# Patient Record
Sex: Female | Born: 1990 | Race: Black or African American | Hispanic: No | Marital: Married | Smoking: Never smoker
Health system: Southern US, Community
[De-identification: ages and names within clinical notes are randomized; demographics above are authoritative.]

## PROBLEM LIST (undated history)

## (undated) ENCOUNTER — Inpatient Hospital Stay (HOSPITAL_COMMUNITY): Payer: Self-pay

## (undated) DIAGNOSIS — N289 Disorder of kidney and ureter, unspecified: Secondary | ICD-10-CM

## (undated) DIAGNOSIS — T7840XA Allergy, unspecified, initial encounter: Secondary | ICD-10-CM

## (undated) DIAGNOSIS — G43909 Migraine, unspecified, not intractable, without status migrainosus: Secondary | ICD-10-CM

## (undated) DIAGNOSIS — K5909 Other constipation: Secondary | ICD-10-CM

## (undated) DIAGNOSIS — R112 Nausea with vomiting, unspecified: Secondary | ICD-10-CM

## (undated) DIAGNOSIS — K8020 Calculus of gallbladder without cholecystitis without obstruction: Secondary | ICD-10-CM

## (undated) DIAGNOSIS — Z9889 Other specified postprocedural states: Secondary | ICD-10-CM

## (undated) DIAGNOSIS — R51 Headache: Secondary | ICD-10-CM

## (undated) DIAGNOSIS — R519 Headache, unspecified: Secondary | ICD-10-CM

## (undated) DIAGNOSIS — F319 Bipolar disorder, unspecified: Secondary | ICD-10-CM

## (undated) HISTORY — PX: TONSILLECTOMY: SUR1361

## (undated) HISTORY — PX: OTHER SURGICAL HISTORY: SHX169

## (undated) HISTORY — DX: Bipolar disorder, unspecified: F31.9

## (undated) HISTORY — PX: CHOLECYSTECTOMY: SHX55

## (undated) HISTORY — DX: Other constipation: K59.09

## (undated) HISTORY — PX: MANDIBLE SURGERY: SHX707

---
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
## 2010-01-23 ENCOUNTER — Telehealth (INDEPENDENT_AMBULATORY_CARE_PROVIDER_SITE_OTHER): Payer: Self-pay | Admitting: *Deleted

## 2010-04-20 ENCOUNTER — Encounter: Payer: Self-pay | Admitting: Pediatrics

## 2010-05-01 NOTE — Progress Notes (Signed)
  Phone Note Call from Patient   Summary of Call: Sharon Townsend CALLED  WAS TOLD BY [facility] THAT HER RECORDS WERE SENT HERE,BUT SHE WANTS TO GO TO [place] /WE DON'T HAVE THEM YET  Initial call taken by: Arta Bruce,  January 23, 2010 3:16 PM  Follow-up for Phone Call        records never came in Follow-up by: Arta Bruce,  February 17, 2010 12:00 PM

## 2010-06-13 LAB — BASIC METABOLIC PANEL
BUN: 9 mg/dL (ref 6–23)
Calcium: 9.5 mg/dL (ref 8.4–10.5)
Creatinine, Ser: 0.79 mg/dL (ref 0.4–1.2)
GFR calc non Af Amer: >60 mL/min (ref >60)
Glucose, Bld: 92 mg/dL (ref 70–99)
Sodium: 142 mEq/L (ref 135–145)

## 2010-06-13 LAB — CBC
HCT: 38.7 % (ref 36.0–46.0)
Hemoglobin: 13.4 g/dL (ref 12.0–15.0)
MCH: 31.3 pg (ref 26.0–34.0)
MCHC: 34.6 g/dL (ref 30.0–36.0)
MCV: 90.5 fL (ref 78.0–100.0)
Platelets: 272 10*3/uL (ref 150–400)
RBC: 4.28 MIL/uL (ref 3.87–5.11)
RDW: 12.1 % (ref 11.5–15.5)
WBC: 9.7 10*3/uL (ref 4.0–10.5)

## 2010-06-13 LAB — COMPREHENSIVE METABOLIC PANEL
ALT: 13 U/L (ref 0–35)
AST: 20 U/L (ref 0–37)
Albumin: 4.4 g/dL (ref 3.5–5.2)
Alkaline Phosphatase: 72 U/L (ref 39–117)
BUN: 9 mg/dL (ref 6–23)
CO2: 22 mEq/L (ref 19–32)
Calcium: 9.4 mg/dL (ref 8.4–10.5)
Chloride: 110 mEq/L (ref 96–112)
Creatinine, Ser: 0.79 mg/dL (ref 0.4–1.2)
GFR calc Af Amer: >60 mL/min (ref >60)
GFR calc non Af Amer: >60 mL/min (ref >60)
Glucose, Bld: 102 mg/dL — ABNORMAL HIGH (ref 70–99)
Potassium: 3.6 mEq/L (ref 3.5–5.1)
Sodium: 141 mEq/L (ref 135–145)
Total Bilirubin: 0.4 mg/dL (ref 0.3–1.2)
Total Protein: 7.6 g/dL (ref 6.0–8.3)

## 2010-06-13 LAB — URINALYSIS, ROUTINE W REFLEX MICROSCOPIC
Ketones, ur: NEGATIVE mg/dL
Nitrite: NEGATIVE
Protein, ur: NEGATIVE mg/dL
Urobilinogen, UA: 0.2 mg/dL (ref 0.0–1.0)
pH: 6 (ref 5.0–8.0)

## 2010-06-13 LAB — DIFFERENTIAL
Basophils Absolute: 0.1 10*3/uL (ref 0.0–0.1)
Eosinophils Absolute: 0.1 10*3/uL (ref 0.0–0.7)
Lymphs Abs: 2.2 10*3/uL (ref 0.7–4.0)
Neutrophils Relative %: 72 % (ref 43–77)

## 2010-06-13 LAB — ACETAMINOPHEN LEVEL: Acetaminophen (Tylenol), Serum: 51.4 ug/mL — ABNORMAL HIGH (ref 10–30)

## 2010-06-13 LAB — SALICYLATE LEVEL: Salicylate Lvl: <4 mg/dL (ref 2.8–20.0)

## 2010-06-13 LAB — ETHANOL: Alcohol, Ethyl (B): <5 mg/dL (ref 0–10)

## 2010-06-15 LAB — URINALYSIS, ROUTINE W REFLEX MICROSCOPIC
Bilirubin Urine: NEGATIVE
Hgb urine dipstick: NEGATIVE
Ketones, ur: NEGATIVE mg/dL
Nitrite: NEGATIVE
Protein, ur: NEGATIVE mg/dL
Urobilinogen, UA: 0.2 mg/dL (ref 0.0–1.0)

## 2010-06-22 LAB — POCT I-STAT, CHEM 8
BUN: 12 mg/dL (ref 6–23)
Calcium, Ion: 1.22 mmol/L (ref 1.12–1.32)
Chloride: 106 mEq/L (ref 96–112)
HCT: 40 % (ref 36.0–46.0)
Sodium: 140 mEq/L (ref 135–145)
TCO2: 27 mmol/L (ref 0–100)

## 2010-06-22 LAB — POCT URINALYSIS DIP (DEVICE)
Hgb urine dipstick: NEGATIVE
Nitrite: POSITIVE — AB
Specific Gravity, Urine: 1.02 (ref 1.005–1.030)
Urobilinogen, UA: 1 mg/dL (ref 0.0–1.0)
pH: 6.5 (ref 5.0–8.0)

## 2010-06-22 LAB — URINE CULTURE

## 2010-07-03 LAB — COMPREHENSIVE METABOLIC PANEL
ALT: 14 U/L (ref 0–35)
AST: 23 U/L (ref 0–37)
Albumin: 4.5 g/dL (ref 3.5–5.2)
Alkaline Phosphatase: 67 U/L (ref 39–117)
BUN: 11 mg/dL (ref 6–23)
CO2: 21 mEq/L (ref 19–32)
Calcium: 9.6 mg/dL (ref 8.4–10.5)
Chloride: 107 mEq/L (ref 96–112)
Creatinine, Ser: 0.83 mg/dL (ref 0.4–1.2)
GFR calc Af Amer: >60 mL/min (ref >60)
GFR calc non Af Amer: >60 mL/min (ref >60)
Glucose, Bld: 99 mg/dL (ref 70–99)
Potassium: 3.5 mEq/L (ref 3.5–5.1)
Sodium: 137 mEq/L (ref 135–145)
Total Bilirubin: 0.7 mg/dL (ref 0.3–1.2)
Total Protein: 7.4 g/dL (ref 6.0–8.3)

## 2010-07-03 LAB — DIFFERENTIAL
Basophils Absolute: 0.1 10*3/uL (ref 0.0–0.1)
Basophils Relative: 1 % (ref 0–1)
Eosinophils Absolute: 0.1 10*3/uL (ref 0.0–0.7)
Eosinophils Relative: 1 % (ref 0–5)
Lymphocytes Relative: 27 % (ref 12–46)
Lymphs Abs: 2.7 10*3/uL (ref 0.7–4.0)
Monocytes Absolute: 0.6 10*3/uL (ref 0.1–1.0)
Monocytes Relative: 6 % (ref 3–12)
Neutro Abs: 6.4 10*3/uL (ref 1.7–7.7)
Neutrophils Relative %: 65 % (ref 43–77)

## 2010-07-03 LAB — URINALYSIS, ROUTINE W REFLEX MICROSCOPIC
Bilirubin Urine: NEGATIVE
Glucose, UA: NEGATIVE mg/dL
Hgb urine dipstick: NEGATIVE
Ketones, ur: NEGATIVE mg/dL
Nitrite: NEGATIVE
Protein, ur: NEGATIVE mg/dL
Specific Gravity, Urine: 1.006 (ref 1.005–1.030)
Urobilinogen, UA: 0.2 mg/dL (ref 0.0–1.0)
pH: 6.5 (ref 5.0–8.0)

## 2010-07-03 LAB — URINE CULTURE

## 2010-07-03 LAB — LIPASE, BLOOD: Lipase: 28 U/L (ref 11–59)

## 2010-07-03 LAB — CBC
HCT: 38.9 % (ref 36.0–46.0)
Hemoglobin: 13.3 g/dL (ref 12.0–15.0)
MCHC: 34.2 g/dL (ref 30.0–36.0)
MCV: 90.9 fL (ref 78.0–100.0)
Platelets: 242 10*3/uL (ref 150–400)
RBC: 4.29 MIL/uL (ref 3.87–5.11)
RDW: 11.9 % (ref 11.5–15.5)
WBC: 9.9 10*3/uL (ref 4.0–10.5)

## 2010-07-03 LAB — PREGNANCY, URINE: Preg Test, Ur: NEGATIVE

## 2010-07-03 LAB — HEMOCCULT GUIAC POC 1CARD (OFFICE): Fecal Occult Bld: NEGATIVE

## 2010-07-04 LAB — COMPREHENSIVE METABOLIC PANEL
ALT: 12 U/L (ref 0–35)
AST: 24 U/L (ref 0–37)
Albumin: 4.3 g/dL (ref 3.5–5.2)
Calcium: 9.3 mg/dL (ref 8.4–10.5)
Chloride: 109 mEq/L (ref 96–112)
Creatinine, Ser: 0.81 mg/dL (ref 0.4–1.2)
GFR calc Af Amer: >60 mL/min (ref >60)
Sodium: 138 mEq/L (ref 135–145)
Total Bilirubin: 0.5 mg/dL (ref 0.3–1.2)

## 2010-07-04 LAB — RAPID URINE DRUG SCREEN, HOSP PERFORMED
Amphetamines: NOT DETECTED
Barbiturates: NOT DETECTED
Opiates: NOT DETECTED

## 2010-07-04 LAB — URINE MICROSCOPIC-ADD ON

## 2010-07-04 LAB — DIFFERENTIAL
Basophils Absolute: 0 10*3/uL (ref 0.0–0.1)
Eosinophils Absolute: 0.1 10*3/uL (ref 0.0–0.7)
Eosinophils Relative: 1 % (ref 0–5)
Eosinophils Relative: 1 % (ref 0–5)
Lymphocytes Relative: 30 % (ref 12–46)
Lymphocytes Relative: 35 % (ref 12–46)
Lymphs Abs: 2.7 10*3/uL (ref 0.7–4.0)
Monocytes Absolute: 0.5 10*3/uL (ref 0.1–1.0)
Neutro Abs: 4.7 10*3/uL (ref 1.7–7.7)

## 2010-07-04 LAB — URINALYSIS, ROUTINE W REFLEX MICROSCOPIC
Glucose, UA: NEGATIVE mg/dL
Glucose, UA: NEGATIVE mg/dL
Specific Gravity, Urine: 1.018 (ref 1.005–1.030)
pH: 6 (ref 5.0–8.0)
pH: 7 (ref 5.0–8.0)

## 2010-07-04 LAB — CBC
MCV: 91.2 fL (ref 78.0–100.0)
Platelets: 226 10*3/uL (ref 150–400)
Platelets: 231 10*3/uL (ref 150–400)
RBC: 4.39 MIL/uL (ref 3.87–5.11)
RDW: 12.5 % (ref 11.5–15.5)
WBC: 7.5 10*3/uL (ref 4.0–10.5)

## 2010-07-04 LAB — BASIC METABOLIC PANEL
BUN: 10 mg/dL (ref 6–23)
Calcium: 9.5 mg/dL (ref 8.4–10.5)
GFR calc non Af Amer: >60 mL/min (ref >60)
Glucose, Bld: 76 mg/dL (ref 70–99)

## 2010-07-04 LAB — HEMOCCULT GUIAC POC 1CARD (OFFICE): Fecal Occult Bld: POSITIVE

## 2010-07-05 LAB — URINE CULTURE
Colony Count: NO GROWTH
Culture: NO GROWTH

## 2010-07-05 LAB — POCT I-STAT, CHEM 8
BUN: 7 mg/dL (ref 6–23)
Chloride: 107 mEq/L (ref 96–112)
Creatinine, Ser: 0.7 mg/dL (ref 0.4–1.2)
Hemoglobin: 13.9 g/dL (ref 12.0–15.0)
Potassium: 3.8 mEq/L (ref 3.5–5.1)
Sodium: 141 mEq/L (ref 135–145)

## 2010-07-05 LAB — CBC
HCT: 39.8 % (ref 36.0–46.0)
Platelets: 219 10*3/uL (ref 150–400)
WBC: 7.1 10*3/uL (ref 4.0–10.5)

## 2010-07-05 LAB — PROTIME-INR: INR: 1.1 (ref 0.00–1.49)

## 2010-07-05 LAB — TYPE AND SCREEN
ABO/RH(D): O NEG
Antibody Screen: NEGATIVE

## 2010-07-05 LAB — URINALYSIS, ROUTINE W REFLEX MICROSCOPIC
Ketones, ur: NEGATIVE mg/dL
Nitrite: NEGATIVE
Urobilinogen, UA: 0.2 mg/dL (ref 0.0–1.0)
pH: 6.5 (ref 5.0–8.0)

## 2010-07-05 LAB — DIFFERENTIAL
Lymphocytes Relative: 29 % (ref 12–46)
Lymphs Abs: 2.1 10*3/uL (ref 0.7–4.0)
Neutro Abs: 4.2 10*3/uL (ref 1.7–7.7)
Neutrophils Relative %: 58 % (ref 43–77)

## 2010-07-05 LAB — HEPATIC FUNCTION PANEL
ALT: <8 U/L (ref 0–35)
AST: 21 U/L (ref 0–37)
Albumin: 4.5 g/dL (ref 3.5–5.2)
Alkaline Phosphatase: 61 U/L (ref 39–117)
Total Protein: 7.8 g/dL (ref 6.0–8.3)

## 2010-07-05 LAB — APTT: aPTT: 30 seconds (ref 24–37)

## 2010-07-05 LAB — URINE MICROSCOPIC-ADD ON

## 2010-07-07 LAB — URINALYSIS, ROUTINE W REFLEX MICROSCOPIC
Hgb urine dipstick: NEGATIVE
Nitrite: NEGATIVE
Specific Gravity, Urine: 1.004 — ABNORMAL LOW (ref 1.005–1.030)
Urobilinogen, UA: 0.2 mg/dL (ref 0.0–1.0)
pH: 7 (ref 5.0–8.0)

## 2010-07-08 LAB — DIFFERENTIAL
Basophils Absolute: 0.1 10*3/uL (ref 0.0–0.1)
Basophils Relative: 1 % (ref 0–1)
Lymphocytes Relative: 22 % — ABNORMAL LOW (ref 24–48)
Monocytes Absolute: 0.5 10*3/uL (ref 0.2–1.2)
Monocytes Relative: 6 % (ref 3–11)
Neutro Abs: 4.6 10*3/uL (ref 1.7–8.0)
Neutro Abs: 7 10*3/uL (ref 1.7–8.0)
Neutrophils Relative %: 58 % (ref 43–71)

## 2010-07-08 LAB — URINALYSIS, ROUTINE W REFLEX MICROSCOPIC
Glucose, UA: NEGATIVE mg/dL
Hgb urine dipstick: NEGATIVE
Hgb urine dipstick: NEGATIVE
Specific Gravity, Urine: 1.01 (ref 1.005–1.030)
Specific Gravity, Urine: 1.016 (ref 1.005–1.030)
Urobilinogen, UA: 0.2 mg/dL (ref 0.0–1.0)
Urobilinogen, UA: 1 mg/dL (ref 0.0–1.0)
pH: 7.5 (ref 5.0–8.0)

## 2010-07-08 LAB — COMPREHENSIVE METABOLIC PANEL
AST: 24 U/L (ref 0–37)
Albumin: 4.2 g/dL (ref 3.5–5.2)
Alkaline Phosphatase: 70 U/L (ref 47–119)
Alkaline Phosphatase: 72 U/L (ref 47–119)
BUN: 10 mg/dL (ref 6–23)
CO2: 24 mEq/L (ref 19–32)
Chloride: 108 mEq/L (ref 96–112)
Chloride: 109 mEq/L (ref 96–112)
Glucose, Bld: 85 mg/dL (ref 70–99)
Potassium: 3.8 mEq/L (ref 3.5–5.1)
Potassium: 4.1 mEq/L (ref 3.5–5.1)
Total Bilirubin: 0.5 mg/dL (ref 0.3–1.2)
Total Bilirubin: 0.9 mg/dL (ref 0.3–1.2)

## 2010-07-08 LAB — URINE CULTURE

## 2010-07-08 LAB — CBC
HCT: 37.3 % (ref 36.0–49.0)
HCT: 38.7 % (ref 36.0–49.0)
Hemoglobin: 13.5 g/dL (ref 12.0–16.0)
Platelets: 240 10*3/uL (ref 150–400)
RBC: 4.3 MIL/uL (ref 3.80–5.70)
RDW: 12.3 % (ref 11.4–15.5)
WBC: 8 10*3/uL (ref 4.5–13.5)
WBC: 9.8 10*3/uL (ref 4.5–13.5)

## 2010-07-08 LAB — URINE MICROSCOPIC-ADD ON

## 2010-07-08 LAB — TSH: TSH: 1.262 u[IU]/mL (ref 0.350–4.500)

## 2010-07-08 LAB — POCT PREGNANCY, URINE: Preg Test, Ur: NEGATIVE

## 2010-07-08 LAB — PREGNANCY, URINE: Preg Test, Ur: NEGATIVE

## 2010-07-14 LAB — URINE CULTURE: Colony Count: 70000

## 2010-07-14 LAB — COMPREHENSIVE METABOLIC PANEL
ALT: 14 U/L (ref 0–35)
AST: 23 U/L (ref 0–37)
Albumin: 4.4 g/dL (ref 3.5–5.2)
CO2: 26 mEq/L (ref 19–32)
Calcium: 10.1 mg/dL (ref 8.4–10.5)
Creatinine, Ser: 0.85 mg/dL (ref 0.4–1.2)
Sodium: 142 mEq/L (ref 135–145)
Total Protein: 7.4 g/dL (ref 6.0–8.3)

## 2010-07-14 LAB — URINALYSIS, ROUTINE W REFLEX MICROSCOPIC
Bilirubin Urine: NEGATIVE
Ketones, ur: NEGATIVE mg/dL
Nitrite: NEGATIVE
Protein, ur: NEGATIVE mg/dL

## 2010-07-14 LAB — DIFFERENTIAL
Eosinophils Absolute: 0.1 10*3/uL (ref 0.0–1.2)
Eosinophils Relative: 2 % (ref 0–5)
Lymphocytes Relative: 21 % — ABNORMAL LOW (ref 24–48)
Lymphs Abs: 1.2 10*3/uL (ref 1.1–4.8)
Monocytes Absolute: 0.6 10*3/uL (ref 0.2–1.2)
Monocytes Relative: 10 % (ref 3–11)

## 2010-07-14 LAB — CBC
MCHC: 33.2 g/dL (ref 31.0–37.0)
Platelets: 260 10*3/uL (ref 150–400)
RBC: 4.82 MIL/uL (ref 3.80–5.70)

## 2010-07-15 LAB — URINALYSIS, ROUTINE W REFLEX MICROSCOPIC
Nitrite: NEGATIVE
Protein, ur: 100 mg/dL — AB
Specific Gravity, Urine: 1.021 (ref 1.005–1.030)
Urobilinogen, UA: 0.2 mg/dL (ref 0.0–1.0)

## 2010-07-15 LAB — URINE CULTURE: Colony Count: >=100000

## 2010-07-15 LAB — PREGNANCY, URINE: Preg Test, Ur: NEGATIVE

## 2010-07-15 LAB — URINE MICROSCOPIC-ADD ON

## 2010-08-12 NOTE — Discharge Summary (Signed)
Sharon Townsend, Sharon Townsend             ACCOUNT NO.:  [id number]   MEDICAL RECORD NO.:  [id number]          PATIENT TYPE:  OBV   LOCATION:  6122                         FACILITY:  [facility]   PHYSICIAN:  Orie Rout, M.D.DATE OF BIRTH:  1991/03/22   DATE OF ADMISSION:  09/25/2008  DATE OF DISCHARGE:  09/28/2008                               DISCHARGE SUMMARY   PRIMARY CARE DOCTOR:  Joesph July, MD at [facility].   DISCHARGE DIAGNOSIS:  Constipation.   DISCHARGE MEDICATIONS:  1. MiraLax 3.5 caps twice daily.  2. Senna 2 tabs twice daily.   LABORATORY DATA:  Urinalysis with negative results, urine color yellow,  appearance clear.  Specific gravity 1.04 and pH 7.  Glucose negative.  Bilirubin negative.  Ketones negative.  Blood negative.  Protein  negative.  Nitrite negative.  Leukocytes negative.   BRIEF HOSPITAL COURSE:  This patient was admitted after failing home  treatments for constipation.  While in hospital, she was given GoLYTELY  for a clean out and then placed on a clear diet.  The patient began  stooling within hours after initiating treatment and was having clear  bowel movements within 2 days.  At this point, the patient was changed  to normal high-fiber diet and her MiraLax and senna doses were restarted  as scheduled.  The patient tolerated the high-fiber diet well in house.  The patient was then discharged home in good condition.   DISCHARGE INSTRUCTIONS:  The patient was encouraged to follow high-fiber  diet.  Please contact your PCP or return to the ED if any fever greater  than 100.4, signs of infection, or severe abdominal pain.   FOLLOWUP APPOINTMENTS:  The patient has anappointment to schedule with  Dr. Kathlene November at [facility] on October 02, 2008, at 11 a.m.  Also, the patient has  a new appointment with Dr. Loleta Chance at [facility] on October 25, 2008,  already scheduled.   DISCHARGE CONDITION:  The patient was discharged to home in good medical  condition.      Renold Don, MD  Electronically Signed      Orie Rout, M.D.  Electronically Signed    JW/MEDQ  D:  09/28/2008  T:  09/29/2008  Job:  [id number]   cc:   Joesph July, MD

## 2010-08-12 NOTE — Discharge Summary (Signed)
Sharon Townsend, Sharon Townsend             ACCOUNT NO.:  [id number]   MEDICAL RECORD NO.:  [id number]          PATIENT TYPE:  INP   LOCATION:  6119                         FACILITY:  [facility]   PHYSICIAN:  Orie Rout, M.D.DATE OF BIRTH:  05/31/1990   DATE OF ADMISSION:  08/23/2008  DATE OF DISCHARGE:  08/29/2008                               DISCHARGE SUMMARY   REASON FOR HOSPITALIZATION:  Constipation.   DISCHARGE DIAGNOSIS:  Constipation, status post clean out.   BRIEF HOSPITAL COURSE:  Sharon Townsend is a 20 year old female with a history  of constipation and chronic abdominal pain, who  presented to the ED  with increasing abdominal pain and emesis.  The patient had been seen in  ED 2 days before with a  KUB  significant for  a large amount of  stool.She was  also  diagnosed with UTI and put on ciprofloxacin.  The  patient was told to increase the amount of MiraLax and this did not  improve her symptoms.  The patient had not stooled in greater than 1  month.  The patient was admitted for clean out.  The patient received an  enema x2 on admission as well as given GoLYTELY via NG tube which was  titrated up to tolerance, maintenance IV fluids, and Reglan.  This  regimen was continued until stool output was clear and the patient's  abdominal pain had resolved.  A repeat KUB on Aug 27, 2008, showed  decreased stool bulk and a KUB on August 29, 2008, showed no signs of  constipation.  The patient will be discharged home on MiraLax as well as  senna.  Of note, the patient was also evaluated by psychology  during  hospitalization for chronic abdominal pain and difficulties in the past  and will follow up with mental health as an outpatient.  Ciprofloxacin  was also continued  to  complete a course of treatment for her urinary  tract infection.   DISCHARGE WEIGHT:  48.7 kg.   DISCHARGE CONDITION:  Improved.   DISCHARGE DIET:  Increase water and high-fiber diet.   DISCHARGE ACTIVITY:  Ad  lib.   PROCEDURES AND OPERATIONS:  1. KUB on Aug 27, 2008:  Stool throughout the colon.  2. KUB on August 29, 2008, showed no signs of constipation.   CONSULTANT:  Psychology.   DISCHARGE MEDICATIONS:  1. MiraLax 1 capful or 17 g as many as needed to have a bowel movement      every day.  2. Senna 8.6 mg 1 tablet twice a day if no bowel movement in 2 days      and do not take more than 5 days in a row.  3. Depo shot.   DISCONTINUED MEDICATION:  Ciprofloxacin, completed course.   FOLLOWUP ISSUES:  The patient with long history of constipation and did  not stool until day of life #7 per mother.  Will need further workup as  outpatient with possible anorectal manometry or rectal biopsy.   FOLLOWUP:  1. The patient is to follow up with Dr. Kathlene November at [facility], phone  number [phone], on September 10, 2008, at 11 o'clock a.m.  2. The patient is also to follow up with [facility], follow up at previously scheduled appointment on September 14, 2008.  3. The patient also to follow up with [facility],      phone number [phone].  Please call to schedule an appointment.      Angelena Sole, MD  Electronically Signed      Orie Rout, M.D.  Electronically Signed    WS/MEDQ  D:  08/29/2008  T:  08/30/2008  Job:  [id number]

## 2010-08-15 NOTE — Op Note (Signed)
[name], Sharon Townsend             ACCOUNT NO.:  [id number]   MEDICAL RECORD NO.:  [id number]          PATIENT TYPE:  OIB   LOCATION:  2550                         FACILITY:  [facility]   PHYSICIAN:  Jon Gills, M.D.  DATE OF BIRTH:  [birth year]   DATE OF PROCEDURE:  04/18/2004  DATE OF DISCHARGE:  04/18/2004                                 OPERATIVE REPORT   PREOPERATIVE DIAGNOSIS:  Epigastric abdominal pain.   POSTOPERATIVE DIAGNOSIS:  Helicobacter gastritis.   SURGEON:  Jon Gills, M.D.   ASSISTANT:  None.   DESCRIPTION OF FINDINGS:  Following informed written consent, the patient  was taken to the operating room and placed under general anesthesia with  continuous cardiopulmonary monitoring.  She remained in the supine position  and the Olympus GIF-140 endoscope was passed by mouth without difficulty.  Examination of the esophagus and duodenum were completely normal.  Marked  nodularity was seen throughout the stomach.  A solitary gastric biopsy was  obtained and was subsequently positive for Helicobacter.  Several gastric  biopsies were obtained and submitted in formalin for histologic review.  Several duodenal biopsies were also obtained.  The duodenal biopsies were  unremarkable, and the aforementioned Helicobacter gastritis was confirmed on  her gastric biopsies.  The endoscope was gradually withdrawn, and [name]  was awakened and taken to the recovery room in satisfactory condition, where  she will be released later today in the care of her mother.   DESCRIPTION OF TECHNICAL PROCEDURES USED:  Olympus GIF-140 endoscope with  cold biopsy forceps.   DESCRIPTION OF SPECIMENS REMOVED:  Gastric x3, duodenal x3.      JHC/MEDQ  D:  04/24/2004  T:  04/24/2004  Job:  [id number]   cc:   Theadore Nan, MD  [address]  [place], [place] [address]  Fax: [phone]

## 2010-08-28 ENCOUNTER — Encounter: Payer: Self-pay | Admitting: Gastroenterology

## 2010-10-07 ENCOUNTER — Ambulatory Visit: Payer: Self-pay | Admitting: Gastroenterology

## 2010-11-14 ENCOUNTER — Emergency Department (HOSPITAL_COMMUNITY): Payer: Medicaid Other

## 2010-11-14 ENCOUNTER — Emergency Department (HOSPITAL_COMMUNITY)
Admission: EM | Admit: 2010-11-14 | Discharge: 2010-11-15 | Disposition: A | Discharge status: Home / Self Care | Payer: Medicaid Other | Attending: Emergency Medicine | Admitting: Emergency Medicine

## 2010-11-14 DIAGNOSIS — K5900 Constipation, unspecified: Secondary | ICD-10-CM | POA: Insufficient documentation

## 2010-11-14 DIAGNOSIS — F3289 Other specified depressive episodes: Secondary | ICD-10-CM | POA: Insufficient documentation

## 2010-11-14 DIAGNOSIS — T450X4A Poisoning by antiallergic and antiemetic drugs, undetermined, initial encounter: Secondary | ICD-10-CM | POA: Insufficient documentation

## 2010-11-14 DIAGNOSIS — Z79899 Other long term (current) drug therapy: Secondary | ICD-10-CM | POA: Insufficient documentation

## 2010-11-14 DIAGNOSIS — F329 Major depressive disorder, single episode, unspecified: Secondary | ICD-10-CM | POA: Insufficient documentation

## 2010-11-14 DIAGNOSIS — T50992A Poisoning by other drugs, medicaments and biological substances, intentional self-harm, initial encounter: Secondary | ICD-10-CM | POA: Insufficient documentation

## 2010-11-14 LAB — DIFFERENTIAL
Basophils Absolute: 0 10*3/uL (ref 0.0–0.1)
Eosinophils Relative: 0 % (ref 0–5)
Lymphocytes Relative: 20 % (ref 12–46)
Monocytes Absolute: 0.8 10*3/uL (ref 0.1–1.0)
Monocytes Relative: 9 % (ref 3–12)

## 2010-11-14 LAB — CBC
HCT: 39.3 % (ref 36.0–46.0)
MCH: 29.8 pg (ref 26.0–34.0)
MCHC: 33.6 g/dL (ref 30.0–36.0)
RDW: 12 % (ref 11.5–15.5)

## 2010-11-14 LAB — COMPREHENSIVE METABOLIC PANEL
ALT: 9 U/L (ref 0–35)
AST: 18 U/L (ref 0–37)
Albumin: 4.8 g/dL (ref 3.5–5.2)
CO2: 26 mEq/L (ref 19–32)
Chloride: 103 mEq/L (ref 96–112)
GFR calc non Af Amer: >60 mL/min (ref >60)
Sodium: 140 mEq/L (ref 135–145)
Total Bilirubin: 0.3 mg/dL (ref 0.3–1.2)

## 2010-11-14 LAB — URINALYSIS, ROUTINE W REFLEX MICROSCOPIC
Bilirubin Urine: NEGATIVE
Glucose, UA: NEGATIVE mg/dL
Ketones, ur: NEGATIVE mg/dL
Protein, ur: NEGATIVE mg/dL
Urobilinogen, UA: 0.2 mg/dL (ref 0.0–1.0)

## 2010-11-14 LAB — ACETAMINOPHEN LEVEL: Acetaminophen (Tylenol), Serum: <15 ug/mL (ref 10–30)

## 2010-11-14 LAB — URINE MICROSCOPIC-ADD ON

## 2010-11-14 LAB — APTT: aPTT: 33 seconds (ref 24–37)

## 2010-11-14 LAB — PROTIME-INR: INR: 1.04 (ref 0.00–1.49)

## 2010-11-18 ENCOUNTER — Ambulatory Visit: Payer: Self-pay | Admitting: Gastroenterology

## 2010-12-24 ENCOUNTER — Inpatient Hospital Stay (INDEPENDENT_AMBULATORY_CARE_PROVIDER_SITE_OTHER)
Admission: RE | Admit: 2010-12-24 | Discharge: 2010-12-24 | Disposition: A | Discharge status: Home / Self Care | Payer: Medicaid Other | Source: Ambulatory Visit | Attending: Family Medicine | Admitting: Family Medicine

## 2010-12-24 DIAGNOSIS — M545 Low back pain: Secondary | ICD-10-CM

## 2011-11-21 ENCOUNTER — Encounter (HOSPITAL_COMMUNITY): Payer: Self-pay | Admitting: Family Medicine

## 2011-11-21 ENCOUNTER — Emergency Department (HOSPITAL_COMMUNITY)
Admission: EM | Admit: 2011-11-21 | Discharge: 2011-11-21 | Disposition: A | Discharge status: Home / Self Care | Payer: Medicaid Other | Attending: Emergency Medicine | Admitting: Emergency Medicine

## 2011-11-21 DIAGNOSIS — Z91040 Latex allergy status: Secondary | ICD-10-CM | POA: Insufficient documentation

## 2011-11-21 DIAGNOSIS — R070 Pain in throat: Secondary | ICD-10-CM | POA: Insufficient documentation

## 2011-11-21 DIAGNOSIS — Z880 Allergy status to penicillin: Secondary | ICD-10-CM | POA: Insufficient documentation

## 2011-11-21 DIAGNOSIS — J029 Acute pharyngitis, unspecified: Secondary | ICD-10-CM

## 2011-11-21 LAB — RAPID STREP SCREEN (MED CTR MEBANE ONLY): Streptococcus, Group A Screen (Direct): NEGATIVE

## 2011-11-21 MED ORDER — IBUPROFEN 600 MG PO TABS: 600.0000 mg | ORAL_TABLET | Freq: Four times a day (QID) | ORAL | Status: AC | PRN

## 2011-11-21 MED ORDER — IBUPROFEN 400 MG PO TABS
800.0000 mg | ORAL_TABLET | Freq: Once | ORAL | Status: AC
  Administered 2011-11-21: 800 mg via ORAL
  Filled 2011-11-21: qty 2

## 2011-11-21 NOTE — ED Provider Notes (Signed)
History  This chart was scribed for Richardean Canal, MD by Erskine Emery. This patient was seen in room TR02C/TR02C and the patient's care was started at 13:28.   CSN: [id number]  Arrival date & time 11/21/11  1231   First MD Initiated Contact with Patient 11/21/11 1328      Chief Complaint  Patient presents with  . Sore Throat    (Consider location/radiation/quality/duration/timing/severity/associated sxs/prior treatment) The history is provided by the patient. No language interpreter was used.  Sharon Townsend is a 21 y.o. female who presents to the Emergency Department complaining of a sore throat (worst on right side) with associated trouble swallowing for the past few days. Pt denies any associated fever, voice change, cough, rhinorrhea, or h/o strep throat. Pt reports she has taken tylenol but nothing else. Pt also reports a h/o chronic constipation and stomach ulcers but claims she is otherwise healthy.   Past Medical History  Diagnosis Date  . Chronic constipation   . Bipolar disorder     History reviewed. No pertinent past surgical history.  History reviewed. No pertinent family history.  History  Substance Use Topics  . Smoking status: Never Smoker   . Smokeless tobacco: Not on file  . Alcohol Use: No    OB History    Grav Para Term Preterm Abortions TAB SAB Ect Mult Living                  Review of Systems  Constitutional: Negative for fever and chills.  HENT: Positive for sore throat and trouble swallowing. Negative for rhinorrhea and voice change.   Respiratory: Negative for cough and shortness of breath.   Gastrointestinal: Positive for constipation. Negative for nausea and vomiting.  Neurological: Negative for weakness.    Allergies  Latex and Penicillins  Home Medications   Current Outpatient Rx  Name Route Sig Dispense Refill  . BUPROPION HCL ER (XL) 300 MG PO TB24 Oral Take 300 mg by mouth daily.    Marland Kitchen POLYETHYLENE GLYCOL 3350 PO PACK Oral Take  17 g by mouth daily.    . IBUPROFEN 600 MG PO TABS Oral Take 1 tablet (600 mg total) by mouth every 6 (six) hours as needed for pain. 30 tablet 0    BP 130/73  Pulse 98  Temp 99.8 F (37.7 C) (Oral)  Resp 20  SpO2 100%  LMP 10/21/2011  Physical Exam  Nursing note and vitals reviewed. Constitutional: She is oriented to person, place, and time. She appears well-developed and well-nourished. No distress.  HENT:  Head: Normocephalic and atraumatic.  Mouth/Throat: Oropharynx is clear and moist. No oropharyngeal exudate or posterior oropharyngeal erythema.       Oropharynx is clear, no erythema, no tonsillar exudate.  Eyes: EOM are normal.  Neck: Neck supple. No tracheal deviation present. No thyromegaly present.       Tender in right submandibular gland. Thyroid is normal and nontender.  Cardiovascular: Normal rate.   Pulmonary/Chest: Effort normal. No respiratory distress.  Musculoskeletal: Normal range of motion.  Neurological: She is alert and oriented to person, place, and time.  Skin: Skin is warm and dry.  Psychiatric: She has a normal mood and affect. Her behavior is normal.    ED Course  Procedures (including critical care time) DIAGNOSTIC STUDIES: Oxygen Saturation is 100% on room air, normal by my interpretation.    COORDINATION OF CARE: 13:32--I evaluated the patient and we discussed a treatment plan including mortin to which the pt agreed.  I notified the pt that her strep test was negative and that her symptoms are most likely due to a viral infection. I recommended she stay hydrated and take motrin.     Labs Reviewed  RAPID STREP SCREEN   No results found.   1. Sore throat       MDM  Sharon Townsend is a 21 y.o. female here with sore throat. No fever, tonsillar exudates. Rapid strep negative. Likely viral. Recommend motrin and fluids.    This document was completed by the scribe at my direction and I have reviewed its accuracy. I have personally  examined the patient and agrees with the above document.   Chaney Malling, MD     Richardean Canal, MD 11/21/11 1341

## 2011-11-21 NOTE — ED Notes (Signed)
Per pt sts sore throat for a few day. Denies any other symptoms.

## 2013-03-26 ENCOUNTER — Emergency Department (HOSPITAL_COMMUNITY)
Admission: EM | Admit: 2013-03-26 | Discharge: 2013-03-26 | Disposition: A | Discharge status: Home / Self Care | Payer: Medicaid Other | Attending: Emergency Medicine | Admitting: Emergency Medicine

## 2013-03-26 ENCOUNTER — Encounter (HOSPITAL_COMMUNITY): Payer: Self-pay | Admitting: Emergency Medicine

## 2013-03-26 DIAGNOSIS — Z79899 Other long term (current) drug therapy: Secondary | ICD-10-CM | POA: Insufficient documentation

## 2013-03-26 DIAGNOSIS — Z8719 Personal history of other diseases of the digestive system: Secondary | ICD-10-CM | POA: Insufficient documentation

## 2013-03-26 DIAGNOSIS — F319 Bipolar disorder, unspecified: Secondary | ICD-10-CM | POA: Insufficient documentation

## 2013-03-26 DIAGNOSIS — Z87448 Personal history of other diseases of urinary system: Secondary | ICD-10-CM | POA: Insufficient documentation

## 2013-03-26 DIAGNOSIS — Z91040 Latex allergy status: Secondary | ICD-10-CM | POA: Insufficient documentation

## 2013-03-26 DIAGNOSIS — Z880 Allergy status to penicillin: Secondary | ICD-10-CM | POA: Insufficient documentation

## 2013-03-26 DIAGNOSIS — H5789 Other specified disorders of eye and adnexa: Secondary | ICD-10-CM | POA: Insufficient documentation

## 2013-03-26 HISTORY — DX: Disorder of kidney and ureter, unspecified: N28.9
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
## 2013-03-27 NOTE — ED Provider Notes (Signed)
Medical screening examination/treatment/procedure(s) were performed by non-physician practitioner and as supervising physician I was immediately available for consultation/collaboration.  EKG Interpretation   None         Audree Camel, MD 03/27/13 [phone]

## 2013-10-28 ENCOUNTER — Encounter (HOSPITAL_COMMUNITY): Payer: Self-pay | Admitting: Emergency Medicine

## 2013-10-28 ENCOUNTER — Emergency Department (HOSPITAL_COMMUNITY): Payer: Medicaid Other

## 2013-10-28 ENCOUNTER — Emergency Department (HOSPITAL_COMMUNITY)
Admission: EM | Admit: 2013-10-28 | Discharge: 2013-10-28 | Disposition: A | Discharge status: Home / Self Care | Payer: Medicaid Other | Attending: Emergency Medicine | Admitting: Emergency Medicine

## 2013-10-28 DIAGNOSIS — Z91040 Latex allergy status: Secondary | ICD-10-CM | POA: Diagnosis not present

## 2013-10-28 DIAGNOSIS — Z87448 Personal history of other diseases of urinary system: Secondary | ICD-10-CM | POA: Diagnosis not present

## 2013-10-28 DIAGNOSIS — R0789 Other chest pain: Secondary | ICD-10-CM

## 2013-10-28 DIAGNOSIS — Z79899 Other long term (current) drug therapy: Secondary | ICD-10-CM | POA: Diagnosis not present

## 2013-10-28 DIAGNOSIS — Z8659 Personal history of other mental and behavioral disorders: Secondary | ICD-10-CM | POA: Diagnosis not present

## 2013-10-28 DIAGNOSIS — R5383 Other fatigue: Secondary | ICD-10-CM

## 2013-10-28 DIAGNOSIS — R5381 Other malaise: Secondary | ICD-10-CM | POA: Diagnosis not present

## 2013-10-28 DIAGNOSIS — R079 Chest pain, unspecified: Secondary | ICD-10-CM | POA: Diagnosis present

## 2013-10-28 DIAGNOSIS — Z880 Allergy status to penicillin: Secondary | ICD-10-CM | POA: Insufficient documentation

## 2013-10-28 DIAGNOSIS — Z3202 Encounter for pregnancy test, result negative: Secondary | ICD-10-CM | POA: Diagnosis not present

## 2013-10-28 DIAGNOSIS — Z791 Long term (current) use of non-steroidal anti-inflammatories (NSAID): Secondary | ICD-10-CM | POA: Insufficient documentation

## 2013-10-28 LAB — CBC
HCT: 37.4 % (ref 36.0–46.0)
Hemoglobin: 12.7 g/dL (ref 12.0–15.0)
MCH: 30.1 pg (ref 26.0–34.0)
MCHC: 34 g/dL (ref 30.0–36.0)
MCV: 88.6 fL (ref 78.0–100.0)
PLATELETS: 240 10*3/uL (ref 150–400)
RBC: 4.22 MIL/uL (ref 3.87–5.11)
RDW: 12 % (ref 11.5–15.5)
WBC: 8.8 10*3/uL (ref 4.0–10.5)

## 2013-10-28 LAB — BASIC METABOLIC PANEL
ANION GAP: 11 (ref 5–15)
BUN: 14 mg/dL (ref 6–23)
CALCIUM: 9.4 mg/dL (ref 8.4–10.5)
CO2: 23 meq/L (ref 19–32)
Chloride: 103 mEq/L (ref 96–112)
Creatinine, Ser: 0.89 mg/dL (ref 0.50–1.10)
GFR calc Af Amer: >90 mL/min (ref >90)
Glucose, Bld: 87 mg/dL (ref 70–99)
Potassium: 4.1 mEq/L (ref 3.7–5.3)
SODIUM: 137 meq/L (ref 137–147)

## 2013-10-28 LAB — D-DIMER, QUANTITATIVE: D-Dimer, Quant: <0.27 ug/mL-FEU (ref 0.00–0.48)

## 2013-10-28 LAB — I-STAT TROPONIN, ED: TROPONIN I, POC: 0 ng/mL (ref 0.00–0.08)

## 2013-10-28 LAB — POC URINE PREG, ED: Preg Test, Ur: NEGATIVE

## 2013-10-28 MED ORDER — IBUPROFEN 800 MG PO TABS
800.0000 mg | ORAL_TABLET | Freq: Once | ORAL | Status: AC
  Administered 2013-10-28: 800 mg via ORAL
  Filled 2013-10-28: qty 1

## 2013-10-28 MED ORDER — IBUPROFEN 800 MG PO TABS: 800.0000 mg | ORAL_TABLET | Freq: Three times a day (TID) | ORAL | Status: DC

## 2013-10-28 NOTE — Discharge Instructions (Signed)
Chest Pain (Nonspecific) °There is no evidence of heart attack or blood clot in the lung. Follow up with your doctor. Return to the ED if you develop new or worsening symptoms. °It is often hard to give a specific diagnosis for the cause of chest pain. There is always a chance that your pain could be related to something serious, such as a heart attack or a blood clot in the lungs. You need to follow up with your health care provider for further evaluation. °CAUSES  °· Heartburn. °· Pneumonia or bronchitis. °· Anxiety or stress. °· Inflammation around your heart (pericarditis) or lung (pleuritis or pleurisy). °· A blood clot in the lung. °· A collapsed lung (pneumothorax). It can develop suddenly on its own (spontaneous pneumothorax) or from trauma to the chest. °· Shingles infection (herpes zoster virus). °The chest wall is composed of bones, muscles, and cartilage. Any of these can be the source of the pain. °· The bones can be bruised by injury. °· The muscles or cartilage can be strained by coughing or overwork. °· The cartilage can be affected by inflammation and become sore (costochondritis). °DIAGNOSIS  °Lab tests or other studies may be needed to find the cause of your pain. Your health care provider may have you take a test called an ambulatory electrocardiogram (ECG). An ECG records your heartbeat patterns over a 24-hour period. You may also have other tests, such as: °· Transthoracic echocardiogram (TTE). During echocardiography, sound waves are used to evaluate how blood flows through your heart. °· Transesophageal echocardiogram (TEE). °· Cardiac monitoring. This allows your health care provider to monitor your heart rate and rhythm in real time. °· Holter monitor. This is a portable device that records your heartbeat and can help diagnose heart arrhythmias. It allows your health care provider to track your heart activity for several days, if needed. °· Stress tests by exercise or by giving medicine  that makes the heart beat faster. °TREATMENT  °· Treatment depends on what may be causing your chest pain. Treatment may include: °¨ Acid blockers for heartburn. °¨ Anti-inflammatory medicine. °¨ Pain medicine for inflammatory conditions. °¨ Antibiotics if an infection is present. °· You may be advised to change lifestyle habits. This includes stopping smoking and avoiding alcohol, caffeine, and chocolate. °· You may be advised to keep your head raised (elevated) when sleeping. This reduces the chance of acid going backward from your stomach into your esophagus. °Most of the time, nonspecific chest pain will improve within 2-3 days with rest and mild pain medicine.  °HOME CARE INSTRUCTIONS  °· If antibiotics were prescribed, take them as directed. Finish them even if you start to feel better. °· For the next few days, avoid physical activities that bring on chest pain. Continue physical activities as directed. °· Do not use any tobacco products, including cigarettes, chewing tobacco, or electronic cigarettes. °· Avoid drinking alcohol. °· Only take medicine as directed by your health care provider. °· Follow your health care provider's suggestions for further testing if your chest pain does not go away. °· Keep any follow-up appointments you made. If you do not go to an appointment, you could develop lasting (chronic) problems with pain. If there is any problem keeping an appointment, call to reschedule. °SEEK MEDICAL CARE IF:  °· Your chest pain does not go away, even after treatment. °· You have a rash with blisters on your chest. °· You have a fever. °SEEK IMMEDIATE MEDICAL CARE IF:  °· You have increased   chest pain or pain that spreads to your arm, neck, jaw, back, or abdomen. °· You have shortness of breath. °· You have an increasing cough, or you cough up blood. °· You have severe back or abdominal pain. °· You feel nauseous or vomit. °· You have severe weakness. °· You faint. °· You have chills. °This is an  emergency. Do not wait to see if the pain will go away. Get medical help at once. Call your local emergency services (911 in [place] Do not drive yourself to the hospital. °MAKE SURE YOU:  °· Understand these instructions. °· Will watch your condition. °· Will get help right away if you are not doing well or get worse. °Document Released: 12/24/2004 Document Revised: 03/21/2013 Document Reviewed: 10/20/2007 °[employer]® Patient Information ©2015 [employer]. This information is not intended to replace advice given to you by your health care provider. Make sure you discuss any questions you have with your health care provider. ° °

## 2013-10-28 NOTE — ED Notes (Signed)
Pager [phone] 

## 2013-10-28 NOTE — ED Provider Notes (Signed)
CSN: [id number]     Arrival date & time 10/28/13  1324 History   First MD Initiated Contact with Patient 10/28/13 1408     Chief Complaint  Patient presents with  . Chest Pain     (Consider location/radiation/quality/duration/timing/severity/associated sxs/prior Treatment) HPI Comments: Patient complains of central chest pain onset yesterday lasting 3-4 minutes at a time radiating to her left arm. Nothing makes it better or worse. Came on with rest. at all she and so denies any shortness of breath, nausea, vomiting, cough. She denies any birth control. [place] pain is worse with palpation. She took ibuprofen and TUMS without relief. Denies abdominal pain, nausea or vomiting. No dysuria or hematuria. She endorses some lightheadedness but no vertigo.   The history is provided by the patient.    Past Medical History  Diagnosis Date  . Chronic constipation   . Bipolar disorder   . Renal disorder    Past Surgical History  Procedure Laterality Date  . Renal stents     No family history on file. History  Substance Use Topics  . Smoking status: Never Smoker   . Smokeless tobacco: Not on file  . Alcohol Use: Yes   OB History   Grav Para Term Preterm Abortions TAB SAB Ect Mult Living                 Review of Systems  Constitutional: Negative for fever, activity change and appetite change.  HENT: Negative for congestion and rhinorrhea.   Respiratory: Positive for chest tightness. Negative for cough and shortness of breath.   Cardiovascular: Positive for chest pain.  Gastrointestinal: Negative for nausea, vomiting and abdominal pain.  Genitourinary: Negative for dysuria, hematuria, vaginal bleeding and vaginal discharge.  Musculoskeletal: Negative for arthralgias, myalgias, neck pain and neck stiffness.  Skin: Negative for rash.  Neurological: Positive for weakness. Negative for headaches.  A complete 10 system review of systems was  obtained and all systems are negative except as noted in the HPI and PMH.      Allergies  Peanuts; Latex; and Penicillins  Home Medications   Prior to Admission medications   Medication Sig Start Date End Date Taking? Authorizing Provider  polyethylene glycol (MIRALAX / GLYCOLAX) packet Take 17 g by mouth daily.   Yes Historical Provider, MD  ibuprofen (ADVIL,MOTRIN) 800 MG tablet Take 1 tablet (800 mg total) by mouth 3 (three) times daily. 10/28/13   Glynn OctaveStephen Trea Latner, MD   BP 104/54  Pulse 79  Temp(Src) 98.6 F (37 C) (Oral)  Resp 18  Ht 5\' 4"  (1.626 m)  Wt 166 lb (75.297 kg)  BMI 28.48 kg/m2  SpO2 100%  LMP 10/07/2013 Physical Exam  Nursing note and vitals reviewed. Constitutional: She is oriented to person, place, and time. She appears well-developed and well-nourished. No distress.  HENT:  Head: Normocephalic and atraumatic.  Mouth/Throat: Oropharynx is clear and moist. No oropharyngeal exudate.  Eyes: Conjunctivae and EOM are normal. Pupils are equal, round, and reactive to light.  Neck: Normal range of motion. Neck supple.  No meningismus.  Cardiovascular: Normal rate, regular rhythm, normal heart sounds and intact distal pulses.   No murmur heard. Pulmonary/Chest: Effort normal and breath sounds normal. No respiratory distress. She exhibits tenderness.  TTP central chest, reproducible pain  Abdominal: Soft. There is no tenderness. There is no rebound and no guarding.  Musculoskeletal: Normal range of motion. She exhibits no edema and no tenderness.  Neurological:  She is alert and oriented to person, place, and time. No cranial nerve deficit. She exhibits normal muscle tone. Coordination normal.  No ataxia on finger to nose bilaterally. No pronator drift. 5/5 strength throughout. CN 2-12 intact. Negative Romberg. Equal grip strength. Sensation intact. Gait is normal.   Skin: Skin is warm.  Psychiatric: She has a normal mood and affect. Her behavior is normal.    ED  Course  Procedures (including critical care time) Labs Review Labs Reviewed  CBC  BASIC METABOLIC PANEL  D-DIMER, QUANTITATIVE  I-STAT TROPOININ, ED  POC URINE PREG, ED    Imaging Review Dg Chest Port 1 View  10/28/2013   CLINICAL DATA:  Chest pain  EXAM: PORTABLE CHEST - 1 VIEW  COMPARISON:  11/14/2010  FINDINGS: The heart size and mediastinal contours are within normal limits. Both lungs are clear. The visualized skeletal structures are unremarkable.  IMPRESSION: No active disease.   Electronically Signed   By: Maryclare Bean M.D.   On: 10/28/2013 14:05     EKG Interpretation   Date/Time:  Saturday October 28 2013 13:31:13 EDT Ventricular Rate:  99 PR Interval:  124 QRS Duration: 64 QT Interval:  356 QTC Calculation: 456 R Axis:   100 Text Interpretation:  Normal sinus rhythm Rightward axis Borderline ECG No  significant change was found Confirmed by Manus Gunning  MD, Jyssica Rief [phone]) on  10/28/2013 2:57:33 PM      MDM   Final diagnoses:  Atypical chest pain   Central chest pain intermittent since yesterday worse with palpation. Lasting 3 or 4 minutes at a time. No shortness of breath, nausea or vomiting. EKG with normal sinus rhythm. Unchanged.  EKG normal sinus rhythm. Chest x-ray negative. D-dimer negative. Pain is reproducible. Low suspicion for ACS or PE. We'll treat with anti-inflammatories.    Glynn Octave, MD 10/28/13 [phone]

## 2013-10-28 NOTE — ED Notes (Signed)
Pt placed into room and placed into gown and on monitor upon arrival to room. Pt monitored by 5 lead, blood pressure, and pulse ox. Portable xray at bedside.

## 2013-10-28 NOTE — ED Notes (Signed)
Pain med given 

## 2013-10-28 NOTE — ED Notes (Signed)
Pt reports chest pressure that started last night while laying down reports pain radiates to L arm. Pt has tried ibuprofen at home with no relief. Pt denies sob,nv, cough, being on birth control, leg pain. [place] Pt also sts she is feeling lightheaded.

## 2013-12-24 ENCOUNTER — Encounter (HOSPITAL_COMMUNITY): Payer: Self-pay | Admitting: *Deleted

## 2013-12-24 ENCOUNTER — Inpatient Hospital Stay (HOSPITAL_COMMUNITY): Payer: Medicaid Other

## 2013-12-24 ENCOUNTER — Inpatient Hospital Stay (HOSPITAL_COMMUNITY)
Admission: AD | Admit: 2013-12-24 | Discharge: 2013-12-24 | Disposition: A | Discharge status: Home / Self Care | Payer: Medicaid Other | Source: Ambulatory Visit | Attending: Obstetrics & Gynecology | Admitting: Obstetrics & Gynecology

## 2013-12-24 DIAGNOSIS — Z880 Allergy status to penicillin: Secondary | ICD-10-CM | POA: Insufficient documentation

## 2013-12-24 DIAGNOSIS — O99891 Other specified diseases and conditions complicating pregnancy: Secondary | ICD-10-CM | POA: Insufficient documentation

## 2013-12-24 DIAGNOSIS — R109 Unspecified abdominal pain: Secondary | ICD-10-CM | POA: Insufficient documentation

## 2013-12-24 DIAGNOSIS — O26899 Other specified pregnancy related conditions, unspecified trimester: Secondary | ICD-10-CM

## 2013-12-24 DIAGNOSIS — O9989 Other specified diseases and conditions complicating pregnancy, childbirth and the puerperium: Secondary | ICD-10-CM

## 2013-12-24 LAB — URINALYSIS, ROUTINE W REFLEX MICROSCOPIC
BILIRUBIN URINE: NEGATIVE
GLUCOSE, UA: NEGATIVE mg/dL
HGB URINE DIPSTICK: NEGATIVE
KETONES UR: NEGATIVE mg/dL
Nitrite: NEGATIVE
PH: 6.5 (ref 5.0–8.0)
Protein, ur: NEGATIVE mg/dL
Specific Gravity, Urine: 1.01 (ref 1.005–1.030)
Urobilinogen, UA: 0.2 mg/dL (ref 0.0–1.0)

## 2013-12-24 LAB — URINE MICROSCOPIC-ADD ON

## 2013-12-24 LAB — CBC
HEMATOCRIT: 38 % (ref 36.0–46.0)
Hemoglobin: 13.3 g/dL (ref 12.0–15.0)
MCH: 30.7 pg (ref 26.0–34.0)
MCHC: 35 g/dL (ref 30.0–36.0)
MCV: 87.8 fL (ref 78.0–100.0)
Platelets: 263 10*3/uL (ref 150–400)
RBC: 4.33 MIL/uL (ref 3.87–5.11)
RDW: 11.9 % (ref 11.5–15.5)
WBC: 11.4 10*3/uL — ABNORMAL HIGH (ref 4.0–10.5)

## 2013-12-24 LAB — HCG, QUANTITATIVE, PREGNANCY: hCG, Beta Chain, Quant, S: 98 m[IU]/mL — ABNORMAL HIGH (ref <5)

## 2013-12-24 LAB — POCT PREGNANCY, URINE: Preg Test, Ur: POSITIVE — AB

## 2013-12-24 NOTE — MAU Note (Signed)
I took 4 pregnancy tests and all positive. Having a lot of cramping. No bleeding. Have had 10wk and 22wk losses.

## 2013-12-24 NOTE — MAU Provider Note (Signed)
History     CSN: [id number]  Arrival date and time: 12/24/13 0037   None     Chief Complaint  Patient presents with  . Abdominal Pain   HPI Pt is G3P0020 of [redacted]w[redacted]d wks gestation with report of abdominal cramping that started today.  Patient's last menstrual period was 11/27/2013.   Denies vaginal bleeding or abnormal discharge.   Past Medical History  Diagnosis Date  . Chronic constipation   . Bipolar disorder   . Renal disorder     Past Surgical History  Procedure Laterality Date  . Renal stents      No family history on file.  History  Substance Use Topics  . Smoking status: Never Smoker   . Smokeless tobacco: Not on file  . Alcohol Use: Yes    Allergies:  Allergies  Allergen Reactions  . Peanuts [Peanut Oil] Anaphylaxis  . Latex Swelling  . Penicillins Other (See Comments)    "shuts down bowel movements"    Prescriptions prior to admission  Medication Sig Dispense Refill  . ibuprofen (ADVIL,MOTRIN) 800 MG tablet Take 1 tablet (800 mg total) by mouth 3 (three) times daily.  21 tablet  0  . polyethylene glycol (MIRALAX / GLYCOLAX) packet Take 17 g by mouth daily.        Review of Systems  Gastrointestinal: Positive for abdominal pain (cramping). Negative for nausea and vomiting.  Genitourinary: Negative for dysuria and urgency.  All other systems reviewed and are negative.  Physical Exam   Blood pressure 134/79, pulse 92, temperature 98.6 F (37 C), resp. rate 18, height  (1.549 m), weight 74.753 kg (164 lb 12.8 oz), last menstrual period 11/27/2013.  Physical Exam  Constitutional: She is oriented to person, place, and time. She appears well-developed and well-nourished. No distress.  HENT:  Head: Normocephalic.  Neck: Normal range of motion. Neck supple.  Cardiovascular: Normal rate, regular rhythm and normal heart sounds.   Respiratory: Effort normal and breath sounds normal. No respiratory distress.  GI: Soft. There is no tenderness.   Genitourinary: Uterus is enlarged. Cervix exhibits no discharge. Right adnexum displays no mass. Left adnexum displays no mass. No bleeding around the vagina.  Musculoskeletal: Normal range of motion.  Neurological: She is alert and oriented to person, place, and time.  Skin: Skin is warm and dry.    MAU Course  Procedures Results for orders placed during the hospital encounter of 12/24/13 (from the past 24 hour(s))  URINALYSIS, ROUTINE W REFLEX MICROSCOPIC     Status: Abnormal   Collection Time    12/24/13  1:05 AM      Result Value Ref Range   Color, Urine YELLOW  YELLOW   APPearance CLEAR  CLEAR   Specific Gravity, Urine 1.010  1.005 - 1.030   pH 6.5  5.0 - 8.0   Glucose, UA NEGATIVE  NEGATIVE mg/dL   Hgb urine dipstick NEGATIVE  NEGATIVE   Bilirubin Urine NEGATIVE  NEGATIVE   Ketones, ur NEGATIVE  NEGATIVE mg/dL   Protein, ur NEGATIVE  NEGATIVE mg/dL   Urobilinogen, UA 0.2  0.0 - 1.0 mg/dL   Nitrite NEGATIVE  NEGATIVE   Leukocytes, UA TRACE (*) NEGATIVE  URINE MICROSCOPIC-ADD ON     Status: Abnormal   Collection Time    12/24/13  1:05 AM      Result Value Ref Range   Squamous Epithelial / LPF MANY (*) RARE   WBC, UA 0-2  <3 WBC/hpf   RBC / HPF  0-2  <3 RBC/hpf   Bacteria, UA FEW (*) RARE  POCT PREGNANCY, URINE     Status: Abnormal   Collection Time    12/24/13  1:15 AM      Result Value Ref Range   Preg Test, Ur POSITIVE (*) NEGATIVE  CBC     Status: Abnormal   Collection Time    12/24/13  1:30 AM      Result Value Ref Range   WBC 11.4 (*) 4.0 - 10.5 K/uL   RBC 4.33  3.87 - 5.11 MIL/uL   Hemoglobin 13.3  12.0 - 15.0 g/dL   HCT 16.1  09.6 - 04.5 %   MCV 87.8  78.0 - 100.0 fL   MCH 30.7  26.0 - 34.0 pg   MCHC 35.0  30.0 - 36.0 g/dL   RDW 40.9  81.1 - 91.4 %   Platelets 263  150 - 400 K/uL  HCG, QUANTITATIVE, PREGNANCY     Status: Abnormal   Collection Time    12/24/13  1:30 AM      Result Value Ref Range   hCG, Beta Chain, Quant, S 98 (*) <5 mIU/mL    Ultrasound: COMPARISON: None.  FINDINGS:  Intrauterine gestational sac: No intrauterine gestational sac  identified.  Yolk sac: Not identified.  Embryo: Not identified.  Cardiac Activity: Not identified.  Maternal uterus/adnexae: Uterus is anteverted. No myometrial masses.  Endometrial stripe thickness appears normal. No endometrial fluid  collections. Both ovaries are identified. Right ovary measures 3.8 x  2.7 x 2.9 cm and contains what appears to be in involuting follicle.  Left ovary measures 2.7 x 1.8 x 1.4 cm and contains normal  follicular changes. Small amount of free fluid in the pelvis.  IMPRESSION:  No intrauterine gestational sac, yolk sac, or fetal pole identified.  Differential considerations include intrauterine pregnancy too early  to be sonographically visualized, missed abortion, or ectopic  pregnancy. Followup ultrasound is recommended in 10-14 days for  further evaluation  Assessment and Plan  23 yo G3P0020 at [redacted]w[redacted]d wks pregnancy Abdominal cramping in pregnancy  Plan: Discharge to home Repeat BHCG in 48 hours Repeat ultrasound in 14 days Ectopic precautions given  Rochele Pages N 12/24/2013, 1:28 AM

## 2013-12-26 ENCOUNTER — Telehealth: Payer: Self-pay | Admitting: Advanced Practice Midwife

## 2013-12-26 ENCOUNTER — Inpatient Hospital Stay (HOSPITAL_COMMUNITY)
Admission: AD | Admit: 2013-12-26 | Discharge: 2013-12-26 | Disposition: A | Discharge status: Home / Self Care | Payer: Medicaid Other | Source: Ambulatory Visit | Attending: Obstetrics | Admitting: Obstetrics

## 2013-12-26 DIAGNOSIS — O0281 Inappropriate change in quantitative human chorionic gonadotropin (hCG) in early pregnancy: Secondary | ICD-10-CM | POA: Insufficient documentation

## 2013-12-26 DIAGNOSIS — O26899 Other specified pregnancy related conditions, unspecified trimester: Secondary | ICD-10-CM

## 2013-12-26 DIAGNOSIS — O9989 Other specified diseases and conditions complicating pregnancy, childbirth and the puerperium: Secondary | ICD-10-CM

## 2013-12-26 DIAGNOSIS — R109 Unspecified abdominal pain: Secondary | ICD-10-CM

## 2013-12-26 LAB — HCG, QUANTITATIVE, PREGNANCY: HCG, BETA CHAIN, QUANT, S: 268 m[IU]/mL — AB (ref <5)

## 2013-12-26 MED ORDER — CONCEPT OB 130-92.4-1 MG PO CAPS: 1.0000 | ORAL_CAPSULE | Freq: Every day | ORAL | Status: DC

## 2013-12-26 NOTE — Telephone Encounter (Signed)
Entered in error

## 2013-12-26 NOTE — MAU Note (Signed)
No complaints offered. Denies pain or bleeding

## 2013-12-26 NOTE — Discharge Instructions (Signed)
Abdominal Pain During Pregnancy °Abdominal pain is common in pregnancy. Most of the time, it does not cause harm. There are many causes of abdominal pain. Some causes are more serious than others. Some of the causes of abdominal pain in pregnancy are easily diagnosed. Occasionally, the diagnosis takes time to understand. Other times, the cause is not determined. Abdominal pain can be a sign that something is very wrong with the pregnancy, or the pain may have nothing to do with the pregnancy at all. For this reason, always tell your health care provider if you have any abdominal discomfort. °HOME CARE INSTRUCTIONS  °Monitor your abdominal pain for any changes. The following actions may help to alleviate any discomfort you are experiencing: °· Do not have sexual intercourse or put anything in your vagina until your symptoms go away completely. °· Get plenty of rest until your pain improves. °· Drink clear fluids if you feel nauseous. Avoid solid food as long as you are uncomfortable or nauseous. °· Only take over-the-counter or prescription medicine as directed by your health care provider. °· Keep all follow-up appointments with your health care provider. °SEEK IMMEDIATE MEDICAL CARE IF: °· You are bleeding, leaking fluid, or passing tissue from the vagina. °· You have increasing pain or cramping. °· You have persistent vomiting. °· You have painful or bloody urination. °· You have a fever. °· You notice a decrease in your baby's movements. °· You have extreme weakness or feel faint. °· You have shortness of breath, with or without abdominal pain. °· You develop a severe headache with abdominal pain. °· You have abnormal vaginal discharge with abdominal pain. °· You have persistent diarrhea. °· You have abdominal pain that continues even after rest, or gets worse. °MAKE SURE YOU:  °· Understand these instructions. °· Will watch your condition. °· Will get help right away if you are not doing well or get  worse. °Document Released: 03/16/2005 Document Revised: 01/04/2013 Document Reviewed: 10/13/2012 °[employer]® Patient Information ©2015 [employer]. This information is not intended to replace advice given to you by your health care provider. Make sure you discuss any questions you have with your health care provider. °First Trimester of Pregnancy °The first trimester of pregnancy is from week 1 until the end of week 12 (months 1 through 3). A week after a sperm fertilizes an egg, the egg will implant on the wall of the uterus. This embryo will begin to develop into a baby. Genes from you and your partner are forming the baby. The female genes determine whether the baby is a boy or a girl. At 6-8 weeks, the eyes and face are formed, and the heartbeat can be seen on ultrasound. At the end of 12 weeks, all the baby's organs are formed.  °Now that you are pregnant, you will want to do everything you can to have a healthy baby. Two of the most important things are to get good prenatal care and to follow your health care provider's instructions. Prenatal care is all the medical care you receive before the baby's birth. This care will help prevent, find, and treat any problems during the pregnancy and childbirth. °BODY CHANGES °Your body goes through many changes during pregnancy. The changes vary from woman to woman.  °· You may gain or lose a couple of pounds at first. °· You may feel sick to your stomach (nauseous) and throw up (vomit). If the vomiting is uncontrollable, call your health care provider. °· You may tire easily. °·   You may develop headaches that can be relieved by medicines approved by your health care provider. °· You may urinate more often. Painful urination may mean you have a bladder infection. °· You may develop heartburn as a result of your pregnancy. °· You may develop constipation because certain hormones are causing the muscles that push waste through your intestines to slow down. °· You may  develop hemorrhoids or swollen, bulging veins (varicose veins). °· Your breasts may begin to grow larger and become tender. Your nipples may stick out more, and the tissue that surrounds them (areola) may become darker. °· Your gums may bleed and may be sensitive to brushing and flossing. °· Dark spots or blotches (chloasma, mask of pregnancy) may develop on your face. This will likely fade after the baby is born. °· Your menstrual periods will stop. °· You may have a loss of appetite. °· You may develop cravings for certain kinds of food. °· You may have changes in your emotions from day to day, such as being excited to be pregnant or being concerned that something may go wrong with the pregnancy and baby. °· You may have more vivid and strange dreams. °· You may have changes in your hair. These can include thickening of your hair, rapid growth, and changes in texture. Some women also have hair loss during or after pregnancy, or hair that feels dry or thin. Your hair will most likely return to normal after your baby is born. °WHAT TO EXPECT AT YOUR PRENATAL VISITS °During a routine prenatal visit: °· You will be weighed to make sure you and the baby are growing normally. °· Your blood pressure will be taken. °· Your abdomen will be measured to track your baby's growth. °· The fetal heartbeat will be listened to starting around week 10 or 12 of your pregnancy. °· Test results from any previous visits will be discussed. °Your health care provider may ask you: °· How you are feeling. °· If you are feeling the baby move. °· If you have had any abnormal symptoms, such as leaking fluid, bleeding, severe headaches, or abdominal cramping. °· If you have any questions. °Other tests that may be performed during your first trimester include: °· Blood tests to find your blood type and to check for the presence of any previous infections. They will also be used to check for low iron levels (anemia) and Rh antibodies. Later in  the pregnancy, blood tests for diabetes will be done along with other tests if problems develop. °· Urine tests to check for infections, diabetes, or protein in the urine. °· An ultrasound to confirm the proper growth and development of the baby. °· An amniocentesis to check for possible genetic problems. °· Fetal screens for spina bifida and Down syndrome. °· You may need other tests to make sure you and the baby are doing well. °HOME CARE INSTRUCTIONS  °Medicines °· Follow your health care provider's instructions regarding medicine use. Specific medicines may be either safe or unsafe to take during pregnancy. °· Take your prenatal vitamins as directed. °· If you develop constipation, try taking a stool softener if your health care provider approves. °Diet °· Eat regular, well-balanced meals. Choose a variety of foods, such as meat or vegetable-based protein, fish, milk and low-fat dairy products, vegetables, fruits, and whole grain breads and cereals. Your health care provider will help you determine the amount of weight gain that is right for you. °· Avoid raw meat and uncooked cheese. These   carry germs that can cause birth defects in the baby. °· Eating four or five small meals rather than three large meals a day may help relieve nausea and vomiting. If you start to feel nauseous, eating a few soda crackers can be helpful. Drinking liquids between meals instead of during meals also seems to help nausea and vomiting. °· If you develop constipation, eat more high-fiber foods, such as fresh vegetables or fruit and whole grains. Drink enough fluids to keep your urine clear or pale yellow. °Activity and Exercise °· Exercise only as directed by your health care provider. Exercising will help you: °¨ Control your weight. °¨ Stay in shape. °¨ Be prepared for labor and delivery. °· Experiencing pain or cramping in the lower abdomen or low back is a good sign that you should stop exercising. Check with your health care  provider before continuing normal exercises. °· Try to avoid standing for long periods of time. Move your legs often if you must stand in one place for a long time. °· Avoid heavy lifting. °· Wear low-heeled shoes, and practice good posture. °· You may continue to have sex unless your health care provider directs you otherwise. °Relief of Pain or Discomfort °· Wear a good support bra for breast tenderness.   °· Take warm sitz baths to soothe any pain or discomfort caused by hemorrhoids. Use hemorrhoid cream if your health care provider approves.   °· Rest with your legs elevated if you have leg cramps or low back pain. °· If you develop varicose veins in your legs, wear support hose. Elevate your feet for 15 minutes, 3-4 times a day. Limit salt in your diet. °Prenatal Care °· Schedule your prenatal visits by the twelfth week of pregnancy. They are usually scheduled monthly at first, then more often in the last 2 months before delivery. °· Write down your questions. Take them to your prenatal visits. °· Keep all your prenatal visits as directed by your health care provider. °Safety °· Wear your seat belt at all times when driving. °· Make a list of emergency phone numbers, including numbers for family, friends, the hospital, and police and fire departments. °General Tips °· Ask your health care provider for a referral to a local prenatal education class. Begin classes no later than at the beginning of month 6 of your pregnancy. °· Ask for help if you have counseling or nutritional needs during pregnancy. Your health care provider can offer advice or refer you to specialists for help with various needs. °· Do not use hot tubs, steam rooms, or saunas. °· Do not douche or use tampons or scented sanitary pads. °· Do not cross your legs for long periods of time. °· Avoid cat litter boxes and soil used by cats. These carry germs that can cause birth defects in the baby and possibly loss of the fetus by miscarriage or  stillbirth. °· Avoid all smoking, herbs, alcohol, and medicines not prescribed by your health care provider. Chemicals in these affect the formation and growth of the baby. °· Schedule a dentist appointment. At home, brush your teeth with a soft toothbrush and be gentle when you floss. °SEEK MEDICAL CARE IF:  °· You have dizziness. °· You have mild pelvic cramps, pelvic pressure, or nagging pain in the abdominal area. °· You have persistent nausea, vomiting, or diarrhea. °· You have a bad smelling vaginal discharge. °· You have pain with urination. °· You notice increased swelling in your face, hands, legs, or ankles. °SEEK   IMMEDIATE MEDICAL CARE IF:  °· You have a fever. °· You are leaking fluid from your vagina. °· You have spotting or bleeding from your vagina. °· You have severe abdominal cramping or pain. °· You have rapid weight gain or loss. °· You vomit blood or material that looks like coffee grounds. °· You are exposed to German measles and have never had them. °· You are exposed to fifth disease or chickenpox. °· You develop a severe headache. °· You have shortness of breath. °· You have any kind of trauma, such as from a fall or a car accident. °Document Released: 03/10/2001 Document Revised: 07/31/2013 Document Reviewed: 01/24/2013 °[employer]® Patient Information ©2015 [employer]. This information is not intended to replace advice given to you by your health care provider. Make sure you discuss any questions you have with your health care provider. ° °

## 2013-12-26 NOTE — MAU Provider Note (Signed)
History   Chief Complaint:  Follow-up   Sharon Townsend is  23 y.o. G3P0020 Patient's last menstrual period was 11/27/2013.Marland Kitchen Patient is here for follow up of quantitative HCG and ongoing surveillance of pregnancy status.   She is [redacted]w[redacted]d weeks gestation  by LMP.    Since her last visit, the patient is without new complaint.   The patient reports bleeding as  none now.    General ROS:  negative  Her previous Quantitative HCG values are:  12/25/13: 98  Physical Exam   Blood pressure 130/56, pulse 93, temperature 99.1 F (37.3 C), temperature source Oral, resp. rate 18, last menstrual period 11/27/2013.  Focused Gynecological Exam: examination not indicated  Labs: Results for orders placed during the hospital encounter of 12/26/13 (from the past 24 hour(s))  HCG, QUANTITATIVE, PREGNANCY   Collection Time    12/26/13 10:25 AM      Result Value Ref Range   hCG, Beta Chain, Quant, S 268 (*) <5 mIU/mL    Ultrasound Studies:   US Ob Comp Less 14 Wks  12/24/2013   CLINICAL DATA:  Cramping. Positive urine pregnancy test. Quantitative beta HCG is 98. LMP is 11/27/2013.  EXAM: OBSTETRIC <14 WK [place] AND TRANSVAGINAL OB US  TECHNIQUE: Both transabdominal and transvaginal ultrasound examinations were performed for complete evaluation of the gestation as well as the maternal uterus, adnexal regions, and pelvic cul-de-sac. Transvaginal technique was performed to assess early pregnancy.  COMPARISON:  None.  FINDINGS: Intrauterine gestational sac: No intrauterine gestational sac identified.  Yolk sac:  Not identified.  Embryo:  Not identified.  Cardiac Activity: Not identified.  Maternal uterus/adnexae: Uterus is anteverted. No myometrial masses. Endometrial stripe thickness appears normal. No endometrial fluid collections. Both ovaries are identified. Right ovary measures 3.8 x 2.7 x 2.9 cm and contains what appears to be in involuting follicle. Left ovary measures 2.7 x 1.8 x 1.4 cm and contains normal  follicular changes. Small amount of free fluid in the pelvis.  IMPRESSION: No intrauterine gestational sac, yolk sac, or fetal pole identified. Differential considerations include intrauterine pregnancy too early to be sonographically visualized, missed abortion, or ectopic pregnancy. Followup ultrasound is recommended in 10-14 days for further evaluation.   Electronically Signed   By: Burman Nieves M.D.   On: 12/24/2013 02:21   US Ob Transvaginal  12/24/2013   CLINICAL DATA:  Cramping. Positive urine pregnancy test. Quantitative beta HCG is 98. LMP is 11/27/2013.  EXAM: OBSTETRIC <14 WK [place] AND TRANSVAGINAL OB US  TECHNIQUE: Both transabdominal and transvaginal ultrasound examinations were performed for complete evaluation of the gestation as well as the maternal uterus, adnexal regions, and pelvic cul-de-sac. Transvaginal technique was performed to assess early pregnancy.  COMPARISON:  None.  FINDINGS: Intrauterine gestational sac: No intrauterine gestational sac identified.  Yolk sac:  Not identified.  Embryo:  Not identified.  Cardiac Activity: Not identified.  Maternal uterus/adnexae: Uterus is anteverted. No myometrial masses. Endometrial stripe thickness appears normal. No endometrial fluid collections. Both ovaries are identified. Right ovary measures 3.8 x 2.7 x 2.9 cm and contains what appears to be in involuting follicle. Left ovary measures 2.7 x 1.8 x 1.4 cm and contains normal follicular changes. Small amount of free fluid in the pelvis.  IMPRESSION: No intrauterine gestational sac, yolk sac, or fetal pole identified. Differential considerations include intrauterine pregnancy too early to be sonographically visualized, missed abortion, or ectopic pregnancy. Followup ultrasound is recommended in 10-14 days for further evaluation.   Electronically Signed  By: Burman NievesWilliam  Stevens M.D.   On: 12/24/2013 02:21    Assessment: 6473w1d weeks gestation with appropriately rise in Quants   Plan: Discharge  him in stable condition. First trimester precautions. Follow-up Information   Follow up with HARPER,CHARLES A, MD. (Start prenatal care)    Specialty:  Obstetrics and Gynecology   Contact information:   [address] [place] [address] [phone]       Follow [facility] with [facility]. (As needed in emergencies)    Contact information:   [address] [id number] [place] [address] [phone]        Medication List         CONCEPT OB 130-92.4-1 MG Caps  Take 1 tablet by mouth daily.         Dorathy KinsmanSMITH, Robynn Marcel 12/26/2013, 10:36 AM

## 2014-01-05 ENCOUNTER — Inpatient Hospital Stay (HOSPITAL_COMMUNITY)
Admission: AD | Admit: 2014-01-05 | Discharge: 2014-01-05 | Disposition: A | Discharge status: Home / Self Care | Payer: Medicaid Other | Source: Ambulatory Visit | Attending: Obstetrics & Gynecology | Admitting: Obstetrics & Gynecology

## 2014-01-05 ENCOUNTER — Inpatient Hospital Stay (HOSPITAL_COMMUNITY): Payer: Medicaid Other

## 2014-01-05 DIAGNOSIS — Z3A01 Less than 8 weeks gestation of pregnancy: Secondary | ICD-10-CM | POA: Diagnosis not present

## 2014-01-05 DIAGNOSIS — O9989 Other specified diseases and conditions complicating pregnancy, childbirth and the puerperium: Secondary | ICD-10-CM | POA: Diagnosis not present

## 2014-01-05 DIAGNOSIS — R1030 Lower abdominal pain, unspecified: Secondary | ICD-10-CM | POA: Diagnosis present

## 2014-01-05 NOTE — MAU Provider Note (Signed)
S: 23 y.o. [id number] 2893w4d presents to MAU for f/u ultrasound. She denies pain or vaginal bleeding today.  O: BP 117/76  Pulse 86  Temp(Src) 98.5 F (36.9 C) (Oral)  Resp 16  SpO2 100%  LMP 11/27/2013  Physical Examination: General appearance - alert, well appearing, and in no distress, oriented to person, place, and time and acyanotic, in no respiratory distress  [place] Ob Transvaginal  01/05/2014   CLINICAL DATA:  Evaluate viability.  Follow-up.  EXAM: TRANSVAGINAL OB ULTRASOUND  TECHNIQUE: Transvaginal ultrasound was performed for complete evaluation of the gestation as well as the maternal uterus, adnexal regions, and pelvic cul-de-sac.  COMPARISON:  12/24/2013  FINDINGS: Intrauterine gestational sac: Visualized/normal in shape.  Yolk sac:  Visualized  Embryo:  Not visualized  Cardiac Activity: Not visualized  Heart Rate:  bpm  MSD: 6.7  mm   5 w   2  d  CRL:     mm    w  d                  US EDC: 09/05/2014  Maternal uterus/adnexae: No subchorionic hemorrhage. Ovaries symmetric in size and echotexture. No adnexal masses. No free fluid.  IMPRESSION: Early intrauterine gestational sac with yolk sac. No fetal pole currently visualized. Recommend followup ultrasound in 14 days to ensure continued expected progression.   Electronically Signed   By: Charlett NoseKevin  Dover M.D.   On: 01/05/2014 12:38   A: IUP on ultrasound with gestational sac and yolk sac  P: F/U with early prenatal care with [facility] as planned Return to MAU as needed for emergencies  Sharen CounterLisa Leftwich-Kirby Certified Nurse-Midwife

## 2014-01-05 NOTE — MAU Note (Signed)
Patient states she was told by her MD office to come to MAU for an ultrasound. Has been having lower abdominal pain since she was last seen in MAU that comes and goes. Denies bleeding or discharge, nausea but no vomiting.

## 2014-01-06 NOTE — MAU Provider Note (Signed)
Attestation of Attending Supervision of Advanced Practitioner (PA/CNM/NP): Evaluation and management procedures were performed by the Advanced Practitioner under my supervision and collaboration.  I have reviewed the Advanced Practitioner's note and chart, and I agree with the management and plan.  Jacob Stinson, DO Attending Physician Faculty Practice, [facility]  

## 2014-01-28 ENCOUNTER — Inpatient Hospital Stay (HOSPITAL_COMMUNITY): Payer: Medicaid Other

## 2014-01-28 ENCOUNTER — Inpatient Hospital Stay (HOSPITAL_COMMUNITY)
Admission: AD | Admit: 2014-01-28 | Discharge: 2014-01-28 | Disposition: A | Discharge status: Home / Self Care | Payer: Medicaid Other | Source: Ambulatory Visit | Attending: Obstetrics | Admitting: Obstetrics

## 2014-01-28 ENCOUNTER — Encounter (HOSPITAL_COMMUNITY): Payer: Self-pay | Admitting: General Practice

## 2014-01-28 DIAGNOSIS — R102 Pelvic and perineal pain: Secondary | ICD-10-CM | POA: Insufficient documentation

## 2014-01-28 DIAGNOSIS — Z3A08 8 weeks gestation of pregnancy: Secondary | ICD-10-CM | POA: Insufficient documentation

## 2014-01-28 DIAGNOSIS — O26891 Other specified pregnancy related conditions, first trimester: Secondary | ICD-10-CM

## 2014-01-28 LAB — URINALYSIS, ROUTINE W REFLEX MICROSCOPIC
Bilirubin Urine: NEGATIVE
Glucose, UA: 100 mg/dL — AB
Hgb urine dipstick: NEGATIVE
KETONES UR: NEGATIVE mg/dL
Leukocytes, UA: NEGATIVE
NITRITE: NEGATIVE
Protein, ur: NEGATIVE mg/dL
SPECIFIC GRAVITY, URINE: 1.015 (ref 1.005–1.030)
Urobilinogen, UA: 1 mg/dL (ref 0.0–1.0)
pH: 8 (ref 5.0–8.0)

## 2014-01-28 MED ORDER — PROMETHAZINE HCL 25 MG PO TABS: 12.5000 mg | ORAL_TABLET | Freq: Four times a day (QID) | ORAL | Status: DC | PRN

## 2014-01-28 NOTE — Discharge Instructions (Signed)
First Trimester of Pregnancy The first trimester of pregnancy is from week 1 until the end of week 12 (months 1 through 3). A week after a sperm fertilizes an egg, the egg will implant on the wall of the uterus. This embryo will begin to develop into a baby. Genes from you and your partner are forming the baby. The female genes determine whether the baby is a boy or a girl. At 6-8 weeks, the eyes and face are formed, and the heartbeat can be seen on ultrasound. At the end of 12 weeks, all the baby's organs are formed.  Now that you are pregnant, you will want to do everything you can to have a healthy baby. Two of the most important things are to get good prenatal care and to follow your health care provider's instructions. Prenatal care is all the medical care you receive before the baby's birth. This care will help prevent, find, and treat any problems during the pregnancy and childbirth. BODY CHANGES Your body goes through many changes during pregnancy. The changes vary from woman to woman.   You may gain or lose a couple of pounds at first.  You may feel sick to your stomach (nauseous) and throw up (vomit). If the vomiting is uncontrollable, call your health care provider.  You may tire easily.  You may develop headaches that can be relieved by medicines approved by your health care provider.  You may urinate more often. Painful urination may mean you have a bladder infection.  You may develop heartburn as a result of your pregnancy.  You may develop constipation because certain hormones are causing the muscles that push waste through your intestines to slow down.  You may develop hemorrhoids or swollen, bulging veins (varicose veins).  Your breasts may begin to grow larger and become tender. Your nipples may stick out more, and the tissue that surrounds them (areola) may become darker.  Your gums may bleed and may be sensitive to brushing and flossing.  Dark spots or blotches (chloasma,  mask of pregnancy) may develop on your face. This will likely fade after the baby is born.  Your menstrual periods will stop.  You may have a loss of appetite.  You may develop cravings for certain kinds of food.  You may have changes in your emotions from day to day, such as being excited to be pregnant or being concerned that something may go wrong with the pregnancy and baby.  You may have more vivid and strange dreams.  You may have changes in your hair. These can include thickening of your hair, rapid growth, and changes in texture. Some women also have hair loss during or after pregnancy, or hair that feels dry or thin. Your hair will most likely return to normal after your baby is born. WHAT TO EXPECT AT YOUR PRENATAL VISITS During a routine prenatal visit:  You will be weighed to make sure you and the baby are growing normally.  Your blood pressure will be taken.  Your abdomen will be measured to track your baby's growth.  The fetal heartbeat will be listened to starting around week 10 or 12 of your pregnancy.  Test results from any previous visits will be discussed. Your health care provider may ask you:  How you are feeling.  If you are feeling the baby move.  If you have had any abnormal symptoms, such as leaking fluid, bleeding, severe headaches, or abdominal cramping.  If you have any questions. Other tests   that may be performed during your first trimester include:  Blood tests to find your blood type and to check for the presence of any previous infections. They will also be used to check for low iron levels (anemia) and Rh antibodies. Later in the pregnancy, blood tests for diabetes will be done along with other tests if problems develop.  Urine tests to check for infections, diabetes, or protein in the urine.  An ultrasound to confirm the proper growth and development of the baby.  An amniocentesis to check for possible genetic problems.  Fetal screens for  spina bifida and Down syndrome.  You may need other tests to make sure you and the baby are doing well. HOME CARE INSTRUCTIONS  Medicines  Follow your health care provider's instructions regarding medicine use. Specific medicines may be either safe or unsafe to take during pregnancy.  Take your prenatal vitamins as directed.  If you develop constipation, try taking a stool softener if your health care provider approves. Diet  Eat regular, well-balanced meals. Choose a variety of foods, such as meat or vegetable-based protein, fish, milk and low-fat dairy products, vegetables, fruits, and whole grain breads and cereals. Your health care provider will help you determine the amount of weight gain that is right for you.  Avoid raw meat and uncooked cheese. These carry germs that can cause birth defects in the baby.  Eating four or five small meals rather than three large meals a day may help relieve nausea and vomiting. If you start to feel nauseous, eating a few soda crackers can be helpful. Drinking liquids between meals instead of during meals also seems to help nausea and vomiting.  If you develop constipation, eat more high-fiber foods, such as fresh vegetables or fruit and whole grains. Drink enough fluids to keep your urine clear or pale yellow. Activity and Exercise  Exercise only as directed by your health care provider. Exercising will help you:  Control your weight.  Stay in shape.  Be prepared for labor and delivery.  Experiencing pain or cramping in the lower abdomen or low back is a good sign that you should stop exercising. Check with your health care provider before continuing normal exercises.  Try to avoid standing for long periods of time. Move your legs often if you must stand in one place for a long time.  Avoid heavy lifting.  Wear low-heeled shoes, and practice good posture.  You may continue to have sex unless your health care provider directs you  otherwise. Relief of Pain or Discomfort  Wear a good support bra for breast tenderness.   Take warm sitz baths to soothe any pain or discomfort caused by hemorrhoids. Use hemorrhoid cream if your health care provider approves.   Rest with your legs elevated if you have leg cramps or low back pain.  If you develop varicose veins in your legs, wear support hose. Elevate your feet for 15 minutes, 3-4 times a day. Limit salt in your diet. Prenatal Care  Schedule your prenatal visits by the twelfth week of pregnancy. They are usually scheduled monthly at first, then more often in the last 2 months before delivery.  Write down your questions. Take them to your prenatal visits.  Keep all your prenatal visits as directed by your health care provider. Safety  Wear your seat belt at all times when driving.  Make a list of emergency phone numbers, including numbers for family, friends, the hospital, and police and fire departments. General Tips    Ask your health care provider for a referral to a local prenatal education class. Begin classes no later than at the beginning of month 6 of your pregnancy.  Ask for help if you have counseling or nutritional needs during pregnancy. Your health care provider can offer advice or refer you to specialists for help with various needs.  Do not use hot tubs, steam rooms, or saunas.  Do not douche or use tampons or scented sanitary pads.  Do not cross your legs for long periods of time.  Avoid cat litter boxes and soil used by cats. These carry germs that can cause birth defects in the baby and possibly loss of the fetus by miscarriage or stillbirth.  Avoid all smoking, herbs, alcohol, and medicines not prescribed by your health care provider. Chemicals in these affect the formation and growth of the baby.  Schedule a dentist appointment. At home, brush your teeth with a soft toothbrush and be gentle when you floss. SEEK MEDICAL CARE IF:   You have  dizziness.  You have mild pelvic cramps, pelvic pressure, or nagging pain in the abdominal area.  You have persistent nausea, vomiting, or diarrhea.  You have a bad smelling vaginal discharge.  You have pain with urination.  You notice increased swelling in your face, hands, legs, or ankles. SEEK IMMEDIATE MEDICAL CARE IF:   You have a fever.  You are leaking fluid from your vagina.  You have spotting or bleeding from your vagina.  You have severe abdominal cramping or pain.  You have rapid weight gain or loss.  You vomit blood or material that looks like coffee grounds.  You are exposed to German measles and have never had them.  You are exposed to fifth disease or chickenpox.  You develop a severe headache.  You have shortness of breath.  You have any kind of trauma, such as from a fall or a car accident. Document Released: 03/10/2001 Document Revised: 07/31/2013 Document Reviewed: 01/24/2013 ExitCare Patient Information 2015 [employer]. This information is not intended to replace advice given to you by your health care provider. Make sure you discuss any questions you have with your health care provider.  Morning Sickness Morning sickness is when you feel sick to your stomach (nauseous) during pregnancy. This nauseous feeling may or may not come with vomiting. It often occurs in the morning but can be a problem any time of day. Morning sickness is most common during the first trimester, but it may continue throughout pregnancy. While morning sickness is unpleasant, it is usually harmless unless you develop severe and continual vomiting (hyperemesis gravidarum). This condition requires more intense treatment.  CAUSES  The cause of morning sickness is not completely known but seems to be related to normal hormonal changes that occur in pregnancy. RISK FACTORS You are at greater risk if you:  Experienced nausea or vomiting before your pregnancy.  Had morning  sickness during a previous pregnancy.  Are pregnant with more than one baby, such as twins. TREATMENT  Do not use any medicines (prescription, over-the-counter, or herbal) for morning sickness without first talking to your health care provider. Your health care provider may prescribe or recommend:  Vitamin B6 supplements.  Anti-nausea medicines.  The herbal medicine ginger. HOME CARE INSTRUCTIONS   Only take over-the-counter or prescription medicines as directed by your health care provider.  Taking multivitamins before getting pregnant can prevent or decrease the severity of morning sickness in most women.  Eat a piece of dry   toast or unsalted crackers before getting out of bed in the morning.  Eat five or six small meals a day.  Eat dry and bland foods (rice, baked potato). Foods high in carbohydrates are often helpful.  Do not drink liquids with your meals. Drink liquids between meals.  Avoid greasy, fatty, and spicy foods.  Get someone to cook for you if the smell of any food causes nausea and vomiting.  If you feel nauseous after taking prenatal vitamins, take the vitamins at night or with a snack.  Snack on protein foods (nuts, yogurt, cheese) between meals if you are hungry.  Eat unsweetened gelatins for desserts.  Wearing an acupressure wristband (worn for sea sickness) may be helpful.  Acupuncture may be helpful.  Do not smoke.  Get a humidifier to keep the air in your house free of odors.  Get plenty of fresh air. SEEK MEDICAL CARE IF:   Your home remedies are not working, and you need medicine.  You feel dizzy or lightheaded.  You are losing weight. SEEK IMMEDIATE MEDICAL CARE IF:   You have persistent and uncontrolled nausea and vomiting.  You pass out (faint). MAKE SURE YOU:  Understand these instructions.  Will watch your condition.  Will get help right away if you are not doing well or get worse. Document Released: 05/07/2006 Document  Revised: 03/21/2013 Document Reviewed: 08/31/2012 ExitCare Patient Information 2015 [employer]. This information is not intended to replace advice given to you by your health care provider. Make sure you discuss any questions you have with your health care provider.  

## 2014-01-28 NOTE — MAU Provider Note (Signed)
History     CSN: [id number]  Arrival date and time: 01/28/14 1950   None     Chief Complaint  Patient presents with  . Abdominal Pain   HPI  Sharon Townsend is a 23 y.o. G3P0020 at 5864w6d who presents today with lower abdominal pain since 1100 this morning. She rates her pain 8/10, and she has not taken anything for it at this time. She also reports nausea and vomiting. She states that she has been vomiting all day. She has not been given any antiemetics. She denies any vaginal bleeding.    Past Medical History  Diagnosis Date  . Chronic constipation   . Bipolar disorder   . Renal disorder     Past Surgical History  Procedure Laterality Date  . Renal stents    . Tonsillectomy    . Mandible surgery      History reviewed. No pertinent family history.  History  Substance Use Topics  . Smoking status: Never Smoker   . Smokeless tobacco: Not on file  . Alcohol Use: Yes    Allergies:  Allergies  Allergen Reactions  . Peanuts [Peanut Oil] Anaphylaxis  . Latex Swelling  . Penicillins Other (See Comments)    "shuts down bowel movements"    Prescriptions prior to admission  Medication Sig Dispense Refill Last Dose  . Prenat w/o A Vit-FeFum-FePo-FA (CONCEPT OB) 130-92.4-1 MG CAPS Take 1 tablet by mouth daily. 30 capsule 12 01/28/2014 at Unknown time    ROS Physical Exam   Blood pressure 120/70, pulse 86, temperature 99.1 F (37.3 C), temperature source Oral, resp. rate 16, height 5\' 1"  (1.549 m), weight 74.844 kg (165 lb), last menstrual period 11/27/2013, SpO2 100 %.  Physical Exam  Nursing note and vitals reviewed. Constitutional: She is oriented to person, place, and time. She appears well-developed and well-nourished. No distress.  Cardiovascular: Normal rate.   Respiratory: Effort normal.  GI: Soft. There is no tenderness. There is no rebound.  Neurological: She is alert and oriented to person, place, and time.  Skin: Skin is warm and dry.  Psychiatric:  She has a normal mood and affect.    MAU Course  Procedures  [place] Ob Transvaginal  01/28/2014   CLINICAL DATA:  Pelvic pain, first trimester  EXAM: OBSTETRIC <14 WK ULTRASOUND  TECHNIQUE: Transabdominal ultrasound was performed for evaluation of the gestation as well as the maternal uterus and adnexal regions.  COMPARISON:  01/05/2014  FINDINGS: Intrauterine gestational sac: Visualized/normal in shape.  Yolk sac:  Present  Embryo:  Present  Cardiac Activity: Present  Heart Rate: 180 bpm  CRL:   21 mm  8 w 6 d                  US EDC: 09/03/2014  Maternal uterus/adnexae: Within normal limits  IMPRESSION: Single live intrauterine gestation at approximately 8 weeks 6 days.  This study does not constitute nor should be misconstrued as a full fetal survey. This was performed strictly for triage purposes   Electronically Signed   By: Alcide CleverMark  Lukens M.D.   On: 01/28/2014 21:06     Assessment and Plan   1. Pelvic pain affecting pregnancy in first trimester, antepartum    First trimester precautions reviewed N/V comfort measures reviewed Return to MAU as needed    Medication List    TAKE these medications        CONCEPT OB 130-92.4-1 MG Caps  Take 1 tablet by mouth daily.  promethazine 25 MG tablet  Commonly known as:  PHENERGAN  Take 0.5-1 tablets (12.5-25 mg total) by mouth every 6 (six) hours as needed.        Follow-up Information    Follow up with Kathreen CosierMARSHALL,BERNARD A, MD.   Specialty:  Obstetrics and Gynecology   Why:  As scheduled   Contact information:   [address] [place] [address] [phone]        Sharon Townsend, Sharon Townsend 01/28/2014, 9:10 PM

## 2014-01-28 NOTE — MAU Note (Signed)
Pt reports pain in her lower abd all day, denies bleeding.

## 2014-01-29 ENCOUNTER — Encounter (HOSPITAL_COMMUNITY): Payer: Self-pay | Admitting: General Practice

## 2014-02-03 ENCOUNTER — Inpatient Hospital Stay (HOSPITAL_COMMUNITY): Payer: Medicaid Other

## 2014-02-03 ENCOUNTER — Encounter (HOSPITAL_COMMUNITY): Payer: Self-pay | Admitting: *Deleted

## 2014-02-03 ENCOUNTER — Inpatient Hospital Stay (HOSPITAL_COMMUNITY)
Admission: AD | Admit: 2014-02-03 | Discharge: 2014-02-03 | Disposition: A | Discharge status: Home / Self Care | Payer: Medicaid Other | Source: Ambulatory Visit | Attending: Obstetrics | Admitting: Obstetrics

## 2014-02-03 DIAGNOSIS — O468X1 Other antepartum hemorrhage, first trimester: Secondary | ICD-10-CM

## 2014-02-03 DIAGNOSIS — O209 Hemorrhage in early pregnancy, unspecified: Secondary | ICD-10-CM | POA: Insufficient documentation

## 2014-02-03 DIAGNOSIS — O368911 Maternal care for other specified fetal problems, first trimester, fetus 1: Secondary | ICD-10-CM

## 2014-02-03 DIAGNOSIS — Z3A09 9 weeks gestation of pregnancy: Secondary | ICD-10-CM | POA: Diagnosis not present

## 2014-02-03 DIAGNOSIS — O418X11 Other specified disorders of amniotic fluid and membranes, first trimester, fetus 1: Secondary | ICD-10-CM

## 2014-02-03 DIAGNOSIS — O360111 Maternal care for anti-D [Rh] antibodies, first trimester, fetus 1: Secondary | ICD-10-CM

## 2014-02-03 LAB — CBC
HCT: 36 % (ref 36.0–46.0)
Hemoglobin: 12.5 g/dL (ref 12.0–15.0)
MCH: 30.5 pg (ref 26.0–34.0)
MCHC: 34.7 g/dL (ref 30.0–36.0)
MCV: 87.8 fL (ref 78.0–100.0)
Platelets: 214 10*3/uL (ref 150–400)
RBC: 4.1 MIL/uL (ref 3.87–5.11)
RDW: 11.9 % (ref 11.5–15.5)
WBC: 9.3 10*3/uL (ref 4.0–10.5)

## 2014-02-03 LAB — ABO/RH: ABO/RH(D): O NEG

## 2014-02-03 LAB — HCG, QUANTITATIVE, PREGNANCY: HCG, BETA CHAIN, QUANT, S: 96386 m[IU]/mL — AB (ref <5)

## 2014-02-03 MED ORDER — RHO D IMMUNE GLOBULIN 1500 UNIT/2ML IJ SOSY
300.0000 ug | PREFILLED_SYRINGE | Freq: Once | INTRAMUSCULAR | Status: AC
  Administered 2014-02-03: 300 ug via INTRAMUSCULAR
  Filled 2014-02-03: qty 2

## 2014-02-03 NOTE — MAU Note (Signed)
Pt stated she woke up about 30 min ago and was having cramping and vaginal bleeding.

## 2014-02-03 NOTE — Discharge Instructions (Signed)
Subchorionic Hematoma °A subchorionic hematoma is a gathering of blood between the outer wall of the placenta and the inner wall of the womb (uterus). The placenta is the organ that connects the fetus to the wall of the uterus. The placenta performs the feeding, breathing (oxygen to the fetus), and waste removal (excretory work) of the fetus.  °Subchorionic hematoma is the most common abnormality found on a result from ultrasonography done during the first trimester or early second trimester of pregnancy. If there has been little or no vaginal bleeding, early small hematomas usually shrink on their own and do not affect your baby or pregnancy. The blood is gradually absorbed over 1-2 weeks. When bleeding starts later in pregnancy or the hematoma is larger or occurs in an older pregnant woman, the outcome may not be as good. Larger hematomas may get bigger, which increases the chances for miscarriage. Subchorionic hematoma also increases the risk of premature detachment of the placenta from the uterus, preterm (premature) labor, and stillbirth. °HOME CARE INSTRUCTIONS °· Stay on bed rest if your health care provider recommends this. Although bed rest will not prevent more bleeding or prevent a miscarriage, your health care provider may recommend bed rest until you are advised otherwise. °· Avoid heavy lifting (more than 10 lb [4.5 kg]), exercise, sexual intercourse, or douching as directed by your health care provider. °· Keep track of the number of pads you use each day and how soaked (saturated) they are. Write down this information. °· Do not use tampons. °· Keep all follow-up appointments as directed by your health care provider. Your health care provider may ask you to have follow-up blood tests or ultrasound tests or both. °SEEK IMMEDIATE MEDICAL CARE IF: °· You have severe cramps in your stomach, back, abdomen, or pelvis. °· You have a fever. °· You pass large clots or tissue. Save any tissue for your health  care provider to look at. °· Your bleeding increases or you become lightheaded, feel weak, or have fainting episodes. °Document Released: 07/01/2006 Document Revised: 07/31/2013 Document Reviewed: 10/13/2012 °ExitCare® Patient Information ©2015 [employer]. This information is not intended to replace advice given to you by your health care provider. Make sure you discuss any questions you have with your health care provider. ° °Pelvic Rest °Pelvic rest is sometimes recommended for women when:  °· The placenta is partially or completely covering the opening of the cervix (placenta previa). °· There is bleeding between the uterine wall and the amniotic sac in the first trimester (subchorionic hemorrhage). °· The cervix begins to open without labor starting (incompetent cervix, cervical insufficiency). °· The labor is too early (preterm labor). °HOME CARE INSTRUCTIONS °· Do not have sexual intercourse, stimulation, or an orgasm. °· Do not use tampons, douche, or put anything in the vagina. °· Do not lift anything over 10 pounds (4.5 kg). °· Avoid strenuous activity or straining your pelvic muscles. °SEEK MEDICAL CARE IF:  °· You have any vaginal bleeding during pregnancy. Treat this as a potential emergency. °· You have cramping pain felt low in the stomach (stronger than menstrual cramps). °· You notice vaginal discharge (watery, mucus, or bloody). °· You have a low, dull backache. °· There are regular contractions or uterine tightening. °SEEK IMMEDIATE MEDICAL CARE IF: °You have vaginal bleeding and have placenta previa.  °Document Released: 07/11/2010 Document Revised: 06/08/2011 Document Reviewed: 07/11/2010 °ExitCare® Patient Information ©2015 [employer]. This information is not intended to replace advice given to you by your health care provider.   Make sure you discuss any questions you have with your health care provider. ° °

## 2014-02-03 NOTE — MAU Provider Note (Signed)
Chief Complaint: Vaginal Bleeding and Abdominal Pain   First Provider Initiated Contact with Patient 02/03/14 1848     SUBJECTIVE HPI: Sharon Townsend is a 23 y.o. G3P0020 at 6363w5d by LMP who presents to maternity admissions reporting abdominal cramping and bright red vaginal bleeding with onset this morning.  Bleeding has increased over the course of the day, but she denies seeing any clots.  She is changing her pad every 2-3 hours.  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  She denies vaginal itching/burning, urinary symptoms, h/a, dizziness, n/v, or fever/chills.    Past Medical History  Diagnosis Date  . Chronic constipation   . Bipolar disorder   . Renal disorder    Past Surgical History  Procedure Laterality Date  . Renal stents    . Tonsillectomy    . Mandible surgery     History   Social History  . Marital Status: Single    Spouse Name: N/A    Number of Children: N/A  . Years of Education: N/A   Occupational History  . Not on file.   Social History Main Topics  . Smoking status: Never Smoker   . Smokeless tobacco: Not on file  . Alcohol Use: Yes  . Drug Use: No  . Sexual Activity: Not on file   Other Topics Concern  . Not on file   Social History Narrative   No current facility-administered medications on file prior to encounter.   Current Outpatient Prescriptions on File Prior to Encounter  Medication Sig Dispense Refill  . Prenat w/o A Vit-FeFum-FePo-FA (CONCEPT OB) 130-92.4-1 MG CAPS Take 1 tablet by mouth daily. 30 capsule 12  . promethazine (PHENERGAN) 25 MG tablet Take 0.5-1 tablets (12.5-25 mg total) by mouth every 6 (six) hours as needed. 30 tablet 0   Allergies  Allergen Reactions  . Peanuts [Peanut Oil] Anaphylaxis  . Latex Swelling  . Penicillins Other (See Comments)    Reaction:  "shuts down bowel movements"    ROS: Pertinent items in HPI  OBJECTIVE Blood pressure 120/71, pulse 85, temperature 98.4 F (36.9 C),  temperature source Oral, resp. rate 18, last menstrual period 11/27/2013. GENERAL: Well-developed, well-nourished female in no acute distress.  HEENT: Normocephalic HEART: normal rate RESP: normal effort ABDOMEN: Soft, non-tender EXTREMITIES: Nontender, no edema NEURO: Alert and oriented Pelvic exam: Cervix pink, visually closed, without lesion, moderate amount dark red bleeding, vaginal walls and external genitalia normal Bimanual exam: Cervix 0/long/high, firm, anterior, neg CMT, uterus mildly tender, slightly enlarged, adnexa without tenderness, enlargement, or mass  LAB RESULTS Results for orders placed or performed during the hospital encounter of 02/03/14 (from the past 24 hour(s))  CBC     Status: None   Collection Time: 02/03/14  7:25 PM  Result Value Ref Range   WBC 9.3 4.0 - 10.5 K/uL   RBC 4.10 3.87 - 5.11 MIL/uL   Hemoglobin 12.5 12.0 - 15.0 g/dL   HCT 16.136.0 09.636.0 - 04.546.0 %   MCV 87.8 78.0 - 100.0 fL   MCH 30.5 26.0 - 34.0 pg   MCHC 34.7 30.0 - 36.0 g/dL   RDW 40.911.9 81.111.5 - 91.415.5 %   Platelets 214 150 - 400 K/uL    IMAGING [place] Ob Transvaginal  01/28/2014   CLINICAL DATA:  Pelvic pain, first trimester  EXAM: OBSTETRIC <14 WK ULTRASOUND  TECHNIQUE: Transabdominal ultrasound was performed for evaluation of the gestation as well as the maternal uterus and adnexal regions.  COMPARISON:  01/05/2014  FINDINGS: Intrauterine gestational sac: Visualized/normal in shape.  Yolk sac:  Present  Embryo:  Present  Cardiac Activity: Present  Heart Rate: 180 bpm  CRL:   21 mm  8 w 6 d                  US EDC: 09/03/2014  Maternal uterus/adnexae: Within normal limits  IMPRESSION: Single live intrauterine gestation at approximately 8 weeks 6 days.  This study does not constitute nor should be misconstrued as a full fetal survey. This was performed strictly for triage purposes   Electronically Signed   By: Alcide CleverMark  Lukens M.D.   On: 01/28/2014 21:06   [place] Ob Transvaginal  01/05/2014   CLINICAL DATA:   Evaluate viability.  Follow-up.  EXAM: TRANSVAGINAL OB ULTRASOUND  TECHNIQUE: Transvaginal ultrasound was performed for complete evaluation of the gestation as well as the maternal uterus, adnexal regions, and pelvic cul-de-sac.  COMPARISON:  12/24/2013  FINDINGS: Intrauterine gestational sac: Visualized/normal in shape.  Yolk sac:  Visualized  Embryo:  Not visualized  Cardiac Activity: Not visualized  Heart Rate:  bpm  MSD: 6.7  mm   5 w   2  d  CRL:     mm    w  d                  US EDC: 09/05/2014  Maternal uterus/adnexae: No subchorionic hemorrhage. Ovaries symmetric in size and echotexture. No adnexal masses. No free fluid.  IMPRESSION: Early intrauterine gestational sac with yolk sac. No fetal pole currently visualized. Recommend followup ultrasound in 14 days to ensure continued expected progression.   Electronically Signed   By: Charlett NoseKevin  Dover M.D.   On: 01/05/2014 12:38   OB ultrasound pending Report to [place] Armarion Greek, CNM   Sharen CounterLisa Leftwich-Kirby Certified Nurse-Midwife 02/03/2014  7:53 PM  Care of pt assumed by Dorathy KinsmanVirginia Lanice Folden, CNM at 2000. Pt awaiting US. Bleeding stable. Rhophylac given.  [place] Ob Transvaginal  02/03/2014   CLINICAL DATA:  Cramping, bleeding.  Early pregnancy.  EXAM: OBSTETRIC <14 WK US AND TRANSVAGINAL OB US  TECHNIQUE: Both transabdominal and transvaginal ultrasound examinations were performed for complete evaluation of the gestation as well as the maternal uterus, adnexal regions, and pelvic cul-de-sac. Transvaginal technique was performed to assess early pregnancy.  COMPARISON:  None.  FINDINGS: Intrauterine gestational sac: Single  Yolk sac:  Present  Embryo:  Present  Cardiac Activity: Present  Heart Rate:  178 bpm  CRL:   29  mm   9 w 6 d                  US EDC: 09/02/2014  Maternal uterus/adnexae: Small subchorionic hemorrhage noted measuring 2.2 cm. Second small subchorionic hemorrhage is noted along the lower uterine segment measuring 2 cm. Normal ovaries  IMPRESSION: 1.  Single intrauterine gestation with embryo and normal cardiac activity.  2. Estimated gestational age by crown rump length equals 9 weeks 6 days.  3. Two small (2 cm) subchorionic hemorrhages.  .   Electronically Signed   By: Genevive BiStewart  Edmunds M.D.   On: 02/03/2014 21:32   Assessment: 1. Subchorionic hematoma, antepartum, first trimester, fetus 1   2. Vaginal bleeding in pregnancy, first trimester   3. Rh negative, antepartum, first trimester, fetus 1    Plan: D/C home in stable condition. Bleeding precautions and pelvic rest x 1 week. Follow-up Information    Please follow up.   Why:  Start prenatal care  Follow up with [facility] [place] [facility].   Why:  As needed in emergencies   Contact information:   [address] [id number] mc [place] [address] [phone]        Medication List    TAKE these medications        CONCEPT OB 130-92.4-1 MG Caps  Take 1 tablet by mouth daily.     promethazine 25 MG tablet  Commonly known as:  PHENERGAN  Take 0.5-1 tablets (12.5-25 mg total) by mouth every 6 (six) hours as needed.         [place], CNM 02/03/2014 8:59 PM

## 2014-02-04 LAB — RH IG WORKUP (INCLUDES ABO/RH)
ABO/RH(D): O NEG
Antibody Screen: NEGATIVE
Gestational Age(Wks): 9
Unit division: 0

## 2014-03-29 ENCOUNTER — Inpatient Hospital Stay (HOSPITAL_COMMUNITY): Admission: AD | Admit: 2014-03-29 | Payer: Medicaid Other | Source: Ambulatory Visit | Admitting: Obstetrics

## 2014-03-30 HISTORY — PX: OTHER SURGICAL HISTORY: SHX169

## 2014-03-30 NOTE — L&D Delivery Note (Signed)
Delivery Note At 6:20 AM a viable female was delivered via Vaginal, Spontaneous Delivery (Presentation: ; Occiput Posterior).  APGAR: , ; weight  .   Placenta status: , .  Cord:  with the following complications: .  Cord pH: not done  Anesthesia: Epidural  Episiotomy: None Lacerations:   Suture Repair: 2.0 Est. Blood Loss (mL):    Mom to postpartum.  Baby to Couplet care / Skin to Skin.  Tinita Brooker A 08/03/2014, 6:38 AM

## 2014-05-23 LAB — OB RESULTS CONSOLE ABO/RH: RH TYPE: NEGATIVE

## 2014-05-23 LAB — OB RESULTS CONSOLE GC/CHLAMYDIA: Gonorrhea: NEGATIVE

## 2014-05-23 LAB — OB RESULTS CONSOLE HEPATITIS B SURFACE ANTIGEN: Hepatitis B Surface Ag: NEGATIVE

## 2014-05-23 LAB — OB RESULTS CONSOLE RUBELLA ANTIBODY, IGM: Rubella: NON-IMMUNE/NOT IMMUNE

## 2014-05-23 LAB — OB RESULTS CONSOLE HIV ANTIBODY (ROUTINE TESTING): HIV: NONREACTIVE

## 2014-05-23 LAB — OB RESULTS CONSOLE RPR: RPR: NONREACTIVE

## 2014-06-16 ENCOUNTER — Encounter (HOSPITAL_COMMUNITY): Payer: Self-pay | Admitting: *Deleted

## 2014-06-16 ENCOUNTER — Inpatient Hospital Stay (HOSPITAL_COMMUNITY)
Admission: AD | Admit: 2014-06-16 | Discharge: 2014-06-16 | Disposition: A | Discharge status: Home / Self Care | Payer: Medicaid Other | Source: Ambulatory Visit | Attending: Obstetrics | Admitting: Obstetrics

## 2014-06-16 DIAGNOSIS — O368130 Decreased fetal movements, third trimester, not applicable or unspecified: Secondary | ICD-10-CM | POA: Diagnosis present

## 2014-06-16 DIAGNOSIS — Z3689 Encounter for other specified antenatal screening: Secondary | ICD-10-CM

## 2014-06-16 DIAGNOSIS — Z3A28 28 weeks gestation of pregnancy: Secondary | ICD-10-CM | POA: Diagnosis not present

## 2014-06-16 DIAGNOSIS — O368131 Decreased fetal movements, third trimester, fetus 1: Secondary | ICD-10-CM

## 2014-06-16 NOTE — MAU Note (Signed)
Pt presents to MAU with complaints of a decrease in fetal movement since this morning. Pt denies any vaginal bleeding or LOF

## 2014-06-16 NOTE — MAU Provider Note (Signed)
  History     CSN: [id number]  Arrival date and time: 06/16/14 1706   None     Chief Complaint  Patient presents with  . Decreased Fetal Movement   HPI   Ms. Sharon FriedlanderBrittany R Townsend is a 24 y.o. female G3P0020 at 322w5d who presents with decreased fetal movement.  She is scheduled to see Dr. Gaynell FaceMarshall on Monday.  Since she has been to MAU her baby have been moving regularly.  She denies vaginal bleeding and/ or leaking of fluid.  OB History    Gravida Para Term Preterm AB TAB SAB Ectopic Multiple Living   3 0   2  2         Past Medical History  Diagnosis Date  . Chronic constipation   . Bipolar disorder   . Renal disorder     Past Surgical History  Procedure Laterality Date  . Renal stents    . Tonsillectomy    . Mandible surgery      History reviewed. No pertinent family history.  History  Substance Use Topics  . Smoking status: Never Smoker   . Smokeless tobacco: Not on file  . Alcohol Use: Yes    Allergies:  Allergies  Allergen Reactions  . Peanuts [Peanut Oil] Anaphylaxis  . Latex Swelling  . Penicillins Other (See Comments)    Reaction:  "shuts down bowel movements"    Prescriptions prior to admission  Medication Sig Dispense Refill Last Dose  . acetaminophen-codeine (TYLENOL #3) 300-30 MG per tablet Take 1 tablet by mouth every 4 (four) hours as needed for moderate pain.   0 Past Week at Unknown time  . Prenat w/o A Vit-FeFum-FePo-FA (CONCEPT OB) 130-92.4-1 MG CAPS Take 1 tablet by mouth daily. 30 capsule 12 two weeks  . promethazine (PHENERGAN) 25 MG tablet Take 0.5-1 tablets (12.5-25 mg total) by mouth every 6 (six) hours as needed. 30 tablet 0 two weeks   No results found for this or any previous visit (from the past 48 hour(s)).  Review of Systems  Constitutional: Negative for fever and chills.  Gastrointestinal: Negative for nausea, vomiting and abdominal pain.  Genitourinary: Negative for dysuria, urgency, frequency and hematuria.   Physical  Exam   Blood pressure 115/70, pulse 91, temperature 98.8 F (37.1 C), resp. rate 18, last menstrual period 11/27/2013, SpO2 97 %.  Physical Exam  Constitutional: She is oriented to person, place, and time. She appears well-developed and well-nourished. No distress.  HENT:  Head: Normocephalic.  Eyes: Pupils are equal, round, and reactive to light.  Neck: Neck supple.  Respiratory: Effort normal and breath sounds normal.  GI: Soft.  Musculoskeletal: Normal range of motion.  Neurological: She is alert and oriented to person, place, and time.  Skin: Skin is warm. She is not diaphoretic.  Psychiatric: Her behavior is normal.    Fetal Tracing: Baseline: 135 bpm  Variability: Moderate  Accelerations: 15x15 Decelerations: None Toco: None    MAU Course  Procedures  None  MDM NST> reactive   Assessment and Plan   A:  1. Decreased fetal movement, third trimester, fetus 1   2. NST (non-stress test) reactive     P;  Discharge home in stable condition Kick counts Follow up with Dr. Gaynell FaceMarshall as scheduled   Duane LopeJennifer I Ehan Freas, NP 06/16/2014 6:40 PM

## 2014-06-22 ENCOUNTER — Inpatient Hospital Stay (HOSPITAL_COMMUNITY)
Admission: AD | Admit: 2014-06-22 | Discharge: 2014-06-22 | Disposition: A | Discharge status: Home / Self Care | Payer: Medicaid Other | Source: Ambulatory Visit | Attending: Obstetrics | Admitting: Obstetrics

## 2014-06-22 ENCOUNTER — Encounter (HOSPITAL_COMMUNITY): Payer: Self-pay

## 2014-06-22 DIAGNOSIS — N949 Unspecified condition associated with female genital organs and menstrual cycle: Secondary | ICD-10-CM

## 2014-06-22 DIAGNOSIS — Z3A29 29 weeks gestation of pregnancy: Secondary | ICD-10-CM | POA: Diagnosis not present

## 2014-06-22 DIAGNOSIS — O9989 Other specified diseases and conditions complicating pregnancy, childbirth and the puerperium: Secondary | ICD-10-CM | POA: Insufficient documentation

## 2014-06-22 DIAGNOSIS — Z880 Allergy status to penicillin: Secondary | ICD-10-CM | POA: Insufficient documentation

## 2014-06-22 DIAGNOSIS — B9689 Other specified bacterial agents as the cause of diseases classified elsewhere: Secondary | ICD-10-CM | POA: Diagnosis not present

## 2014-06-22 DIAGNOSIS — R102 Pelvic and perineal pain: Secondary | ICD-10-CM | POA: Insufficient documentation

## 2014-06-22 DIAGNOSIS — Z91040 Latex allergy status: Secondary | ICD-10-CM | POA: Diagnosis not present

## 2014-06-22 DIAGNOSIS — O23593 Infection of other part of genital tract in pregnancy, third trimester: Secondary | ICD-10-CM | POA: Insufficient documentation

## 2014-06-22 DIAGNOSIS — N760 Acute vaginitis: Secondary | ICD-10-CM | POA: Insufficient documentation

## 2014-06-22 DIAGNOSIS — R1030 Lower abdominal pain, unspecified: Secondary | ICD-10-CM | POA: Diagnosis present

## 2014-06-22 LAB — URINALYSIS, ROUTINE W REFLEX MICROSCOPIC
Bilirubin Urine: NEGATIVE
Glucose, UA: 250 mg/dL — AB
Hgb urine dipstick: NEGATIVE
KETONES UR: NEGATIVE mg/dL
Leukocytes, UA: NEGATIVE
Nitrite: NEGATIVE
Protein, ur: NEGATIVE mg/dL
Specific Gravity, Urine: <1.005 — ABNORMAL LOW (ref 1.005–1.030)
Urobilinogen, UA: 0.2 mg/dL (ref 0.0–1.0)
pH: 6 (ref 5.0–8.0)

## 2014-06-22 LAB — WET PREP, GENITAL
Trich, Wet Prep: NONE SEEN
Yeast Wet Prep HPF POC: NONE SEEN

## 2014-06-22 MED ORDER — METRONIDAZOLE 500 MG PO TABS: 500.0000 mg | ORAL_TABLET | Freq: Two times a day (BID) | ORAL | Status: DC

## 2014-06-22 NOTE — Discharge Instructions (Signed)
Bacterial Vaginosis °Bacterial vaginosis is an infection of the vagina. It happens when too many of certain germs (bacteria) grow in the vagina. °HOME CARE °· Take your medicine as told by your doctor. °· Finish your medicine even if you start to feel better. °· Do not have sex until you finish your medicine and are better. °· Tell your sex partner that you have an infection. They should see their doctor for treatment. °· Practice safe sex. Use condoms. Have only one sex partner. °GET HELP IF: °· You are not getting better after 3 days of treatment. °· You have more grey fluid (discharge) coming from your vagina than before. °· You have more pain than before. °· You have a fever. °MAKE SURE YOU:  °· Understand these instructions. °· Will watch your condition. °· Will get help right away if you are not doing well or get worse. °Document Released: 12/24/2007 Document Revised: 01/04/2013 Document Reviewed: 10/26/2012 °ExitCare® Patient Information ©2015 [employer]. This information is not intended to replace advice given to you by your health care provider. Make sure you discuss any questions you have with your health care provider. °Abdominal Pain During Pregnancy °Belly (abdominal) pain is common during pregnancy. Most of the time, it is not a serious problem. Other times, it can be a sign that something is wrong with the pregnancy. Always tell your doctor if you have belly pain. °HOME CARE °Monitor your belly pain for any changes. The following actions may help you feel better: °· Do not have sex (intercourse) or put anything in your vagina until you feel better. °· Rest until your pain stops. °· Drink clear fluids if you feel sick to your stomach (nauseous). Do not eat solid food until you feel better. °· Only take medicine as told by your doctor. °· Keep all doctor visits as told. °GET HELP RIGHT AWAY IF:  °· You are bleeding, leaking fluid, or pieces of tissue come out of your vagina. °· You have more pain or  cramping. °· You keep throwing up (vomiting). °· You have pain when you pee (urinate) or have blood in your pee. °· You have a fever. °· You do not feel your baby moving as much. °· You feel very weak or feel like passing out. °· You have trouble breathing, with or without belly pain. °· You have a very bad headache and belly pain. °· You have fluid leaking from your vagina and belly pain. °· You keep having watery poop (diarrhea). °· Your belly pain does not go away after resting, or the pain gets worse. °MAKE SURE YOU:  °· Understand these instructions. °· Will watch your condition. °· Will get help right away if you are not doing well or get worse. °Document Released: 03/04/2009 Document Revised: 11/16/2012 Document Reviewed: 10/13/2012 °ExitCare® Patient Information ©2015 [employer]. This information is not intended to replace advice given to you by your health care provider. Make sure you discuss any questions you have with your health care provider. ° °

## 2014-06-22 NOTE — MAU Provider Note (Signed)
History     CSN: [id number]  Arrival date and time: 06/22/14 4098   First Provider Initiated Contact with Patient 06/22/14 2024      Chief Complaint  Patient presents with  . Abdominal Pain   HPI  Ms. Sharon Townsend is a 24 y.o. G3P0020 at [redacted]w[redacted]d who presents to MAU today with complaint of abdominal pain since 1600 and worse since 1800. She states bilateral lower abdominal pain, tightening and shooting to the lower back. She rates her pain at 9/10 now. She has not taken anything for pain. She denies vaginal bleeding, LOF, UTI symptoms, fever, N/V/D or constipation. She does have a small amount of thick, white discharge without irritation, itching or odor. She reports good fetal movement.   OB History    Gravida Para Term Preterm AB TAB SAB Ectopic Multiple Living   3 0   2  2         Past Medical History  Diagnosis Date  . Chronic constipation   . Bipolar disorder   . Renal disorder     Past Surgical History  Procedure Laterality Date  . Renal stents    . Tonsillectomy    . Mandible surgery      Family History  Problem Relation Age of Onset  . Diabetes Mother   . Diabetes Father   . Hypertension Father     History  Substance Use Topics  . Smoking status: Never Smoker   . Smokeless tobacco: Not on file  . Alcohol Use: Yes     Comment: before preg    Allergies:  Allergies  Allergen Reactions  . Peanuts [Peanut Oil] Anaphylaxis  . Latex Swelling  . Penicillins Other (See Comments)    Reaction:  "shuts down bowel movements"    Prescriptions prior to admission  Medication Sig Dispense Refill Last Dose  . acetaminophen-codeine (TYLENOL #3) 300-30 MG per tablet Take 1 tablet by mouth every 4 (four) hours as needed for moderate pain.   0 Past Week at Unknown time  . Prenat w/o A Vit-FeFum-FePo-FA (CONCEPT OB) 130-92.4-1 MG CAPS Take 1 tablet by mouth daily. 30 capsule 12 06/21/2014 at Unknown time  . promethazine (PHENERGAN) 25 MG tablet Take 0.5-1 tablets  (12.5-25 mg total) by mouth every 6 (six) hours as needed. (Patient not taking: Reported on 06/22/2014) 30 tablet 0 Not Taking at Unknown time    Review of Systems  Constitutional: Negative for fever and malaise/fatigue.  Gastrointestinal: Positive for abdominal pain. Negative for nausea, vomiting, diarrhea and constipation.  Genitourinary: Negative for dysuria, urgency and frequency.       Neg - vaginal bleeding, LOF + vaginal discharge   Physical Exam   Blood pressure 127/78, pulse 95, temperature 98.7 F (37.1 C), resp. rate 18, height  (1.549 m), weight 168 lb 3.2 oz (76.295 kg), last menstrual period 11/27/2013, SpO2 100 %.  Physical Exam  Constitutional: She is oriented to person, place, and time. She appears well-developed and well-nourished. No distress.  HENT:  Head: Normocephalic.  Cardiovascular: Normal rate.   Respiratory: Effort normal.  GI: Soft. Bowel sounds are normal. She exhibits no distension and no mass. There is tenderness (mild tenderness to palpation of the lower abdomen bilaterally worse in the LLQ). There is no rebound, no guarding and no CVA tenderness.  Musculoskeletal:       Lumbar back: She exhibits tenderness (mild). She exhibits normal range of motion, no swelling, no edema, no deformity and no spasm.  Neurological:  She is alert and oriented to person, place, and time.  Skin: Skin is warm and dry. No erythema.  Psychiatric: She has a normal mood and affect.  Cervix: closed, thick, posterior  Results for orders placed or performed during the hospital encounter of 06/22/14 (from the past 24 hour(s))  Urinalysis, Routine w reflex microscopic     Status: Abnormal   Collection Time: 06/22/14  8:09 PM  Result Value Ref Range   Color, Urine YELLOW YELLOW   APPearance CLEAR CLEAR   Specific Gravity, Urine <1.005 (L) 1.005 - 1.030   pH 6.0 5.0 - 8.0   Glucose, UA 250 (A) NEGATIVE mg/dL   Hgb urine dipstick NEGATIVE NEGATIVE   Bilirubin Urine NEGATIVE  NEGATIVE   Ketones, ur NEGATIVE NEGATIVE mg/dL   Protein, ur NEGATIVE NEGATIVE mg/dL   Urobilinogen, UA 0.2 0.0 - 1.0 mg/dL   Nitrite NEGATIVE NEGATIVE   Leukocytes, UA NEGATIVE NEGATIVE  Wet prep, genital     Status: Abnormal   Collection Time: 06/22/14  8:40 PM  Result Value Ref Range   Yeast Wet Prep HPF POC NONE SEEN NONE SEEN   Trich, Wet Prep NONE SEEN NONE SEEN   Clue Cells Wet Prep HPF POC FEW (A) NONE SEEN   WBC, Wet Prep HPF POC FEW (A) NONE SEEN     Fetal Monitoring: Baseline: 130 bpm, moderate variability, + accelerations, no decelerations Contractions: none  MAU Course  Procedures None  MDM UA, wet prep today Patient denies recent intercourse and does not want STD testing.  Assessment and Plan  A: SIUP at 4065w4d Round ligament pain Bacterial vaginosis  P: Discharge home Rx for Flagyl given to patient Discussed hygiene products to avoid for re-infection Preterm labor precautions discussed Discussed use of Tylenol, warm bath/shower and abdominal binder for pain Patient advised to follow-up with Dr. Gaynell FaceMarshall as scheduled for routine prenatal care Patient may return to MAU as needed or if her condition were to change or worsen   Marny LowensteinJulie N Wenzel, PA-C  06/22/2014, 9:14 PM

## 2014-06-22 NOTE — MAU Note (Addendum)
Pain in lower abd, vagina and lower back. Feet, hands, nose swollen. Denies LOF or bleeding. Strong pain since 1815 but present since 1600

## 2014-06-30 ENCOUNTER — Inpatient Hospital Stay (HOSPITAL_COMMUNITY)
Admission: AD | Admit: 2014-06-30 | Discharge: 2014-06-30 | Disposition: A | Discharge status: Home / Self Care | Payer: Medicaid Other | Source: Ambulatory Visit | Attending: Obstetrics | Admitting: Obstetrics

## 2014-06-30 ENCOUNTER — Encounter (HOSPITAL_COMMUNITY): Payer: Self-pay | Admitting: *Deleted

## 2014-06-30 DIAGNOSIS — Z3A30 30 weeks gestation of pregnancy: Secondary | ICD-10-CM | POA: Diagnosis not present

## 2014-06-30 DIAGNOSIS — O26893 Other specified pregnancy related conditions, third trimester: Secondary | ICD-10-CM | POA: Diagnosis not present

## 2014-06-30 DIAGNOSIS — O23593 Infection of other part of genital tract in pregnancy, third trimester: Secondary | ICD-10-CM | POA: Insufficient documentation

## 2014-06-30 DIAGNOSIS — B9689 Other specified bacterial agents as the cause of diseases classified elsewhere: Secondary | ICD-10-CM | POA: Insufficient documentation

## 2014-06-30 DIAGNOSIS — N760 Acute vaginitis: Secondary | ICD-10-CM | POA: Insufficient documentation

## 2014-06-30 DIAGNOSIS — N898 Other specified noninflammatory disorders of vagina: Secondary | ICD-10-CM | POA: Diagnosis not present

## 2014-06-30 LAB — URINALYSIS, ROUTINE W REFLEX MICROSCOPIC
Bilirubin Urine: NEGATIVE
GLUCOSE, UA: 100 mg/dL — AB
Hgb urine dipstick: NEGATIVE
KETONES UR: NEGATIVE mg/dL
Leukocytes, UA: NEGATIVE
NITRITE: NEGATIVE
PH: 6 (ref 5.0–8.0)
Protein, ur: NEGATIVE mg/dL
SPECIFIC GRAVITY, URINE: 1.015 (ref 1.005–1.030)
Urobilinogen, UA: 0.2 mg/dL (ref 0.0–1.0)

## 2014-06-30 LAB — POCT FERN TEST: POCT Fern Test: NEGATIVE

## 2014-06-30 LAB — FETAL FIBRONECTIN: Fetal Fibronectin: NEGATIVE

## 2014-06-30 NOTE — MAU Note (Signed)
Pt states here for mucous discharge that was pinkish. Having intermittent lower back and vaginal pain. No uti s/s. Has had bleeding twice with pregnancy.

## 2014-06-30 NOTE — MAU Provider Note (Signed)
History     CSN: [id number]  Arrival date and time: 06/30/14 1631   None     Chief Complaint  Patient presents with  . Vaginal Discharge   HPI Pt is G3P0  pregnant and presents with c/o losing mucous plug at 2 am and then leaking on and off. Pt has had no complications with this pregnancy except has had bleedign 2 times with this pregnancy Pt states she is having some abdominal and pelvic pressure- and cramping- no distinguishable ctx. Pt is not having any bleeding now.  Pt states she has not had sex since she got pregnant. Pt denies UTI sx or constipation/diarrhea with last BM yesterday at 11 am Pt had wet prep on 06/22/2014 which was normal except for a few clue cells.  RN note: RN Registered Nurse Signed  MAU Note 06/30/2014 4:46 PM    Expand All Collapse All   Pt states here for mucous discharge that was pinkish. Having intermittent lower back and vaginal pain. No uti s/s. Has had bleeding twice with pregnancy.             Past Medical History  Diagnosis Date  . Chronic constipation   . Bipolar disorder   . Renal disorder     Past Surgical History  Procedure Laterality Date  . Renal stents    . Tonsillectomy    . Mandible surgery      Family History  Problem Relation Age of Onset  . Diabetes Mother   . Diabetes Father   . Hypertension Father     History  Substance Use Topics  . Smoking status: Never Smoker   . Smokeless tobacco: Not on file  . Alcohol Use: Yes     Comment: before preg    Allergies:  Allergies  Allergen Reactions  . Peanuts [Peanut Oil] Anaphylaxis  . Latex Swelling  . Penicillins Other (See Comments)    Reaction:  "shuts down bowel movements"    Prescriptions prior to admission  Medication Sig Dispense Refill Last Dose  . acetaminophen-codeine (TYLENOL #3) 300-30 MG per tablet Take 1 tablet by mouth every 4 (four) hours as needed for moderate pain.   0 two weeks  . metroNIDAZOLE (FLAGYL) 500 MG tablet Take 1 tablet  (500 mg total) by mouth 2 (two) times daily. 14 tablet 0 06/30/2014 at Unknown time  . Prenat w/o A Vit-FeFum-FePo-FA (CONCEPT OB) 130-92.4-1 MG CAPS Take 1 tablet by mouth daily. 30 capsule 12 06/30/2014 at Unknown time    Review of Systems  Constitutional: Negative for fever and chills.  Gastrointestinal: Negative for nausea, vomiting, abdominal pain, diarrhea and constipation.  Genitourinary: Negative for dysuria and urgency.   Physical Exam   Blood pressure 136/80, pulse 83, temperature 99.7 F (37.6 C), temperature source Oral, resp. rate 16, height  (1.549 m), weight 171 lb 2 oz (77.622 kg), last menstrual period 11/27/2013.  Physical Exam  Nursing note and vitals reviewed. Constitutional: She is oriented to person, place, and time. She appears well-developed and well-nourished. No distress.  HENT:  Head: Normocephalic.  Eyes: Pupils are equal, round, and reactive to light.  Neck: Normal range of motion. Neck supple.  Cardiovascular: Normal rate.   Respiratory: Effort normal.  GI: Soft. She exhibits no distension. There is no tenderness. There is no rebound and no guarding.  Genitourinary:  Small amount of creamy creamy colored discharge in vault; no pooling noted; cervix closed-FT uterus Gravid AGA  NT  Musculoskeletal: Normal range of motion.  Neurological: She is alert and oriented to person, place, and time.  Skin: Skin is warm and dry.  Psychiatric: She has a normal mood and affect.    MAU Course  Procedures Results for orders placed or performed during the hospital encounter of 06/30/14 (from the past 48 hour(s))  Urinalysis, Routine w reflex microscopic     Status: Abnormal   Collection Time: 06/30/14  4:54 PM  Result Value Ref Range   Color, Urine YELLOW YELLOW   APPearance CLEAR CLEAR   Specific Gravity, Urine 1.015 1.005 - 1.030   pH 6.0 5.0 - 8.0   Glucose, UA 100 (A) NEGATIVE mg/dL   Hgb urine dipstick NEGATIVE NEGATIVE   Bilirubin Urine NEGATIVE  NEGATIVE   Ketones, ur NEGATIVE NEGATIVE mg/dL   Protein, ur NEGATIVE NEGATIVE mg/dL   Urobilinogen, UA 0.2 0.0 - 1.0 mg/dL   Nitrite NEGATIVE NEGATIVE   Leukocytes, UA NEGATIVE NEGATIVE    Comment: MICROSCOPIC NOT DONE ON URINES WITH NEGATIVE PROTEIN, BLOOD, LEUKOCYTES, NITRITE, OR GLUCOSE <1000 mg/dL.   Results for orders placed or performed during the hospital encounter of 06/30/14 (from the past 48 hour(s))  Urinalysis, Routine w reflex microscopic     Status: Abnormal   Collection Time: 06/30/14  4:54 PM  Result Value Ref Range   Color, Urine YELLOW YELLOW   APPearance CLEAR CLEAR   Specific Gravity, Urine 1.015 1.005 - 1.030   pH 6.0 5.0 - 8.0   Glucose, UA 100 (A) NEGATIVE mg/dL   Hgb urine dipstick NEGATIVE NEGATIVE   Bilirubin Urine NEGATIVE NEGATIVE   Ketones, ur NEGATIVE NEGATIVE mg/dL   Protein, ur NEGATIVE NEGATIVE mg/dL   Urobilinogen, UA 0.2 0.0 - 1.0 mg/dL   Nitrite NEGATIVE NEGATIVE   Leukocytes, UA NEGATIVE NEGATIVE    Comment: MICROSCOPIC NOT DONE ON URINES WITH NEGATIVE PROTEIN, BLOOD, LEUKOCYTES, NITRITE, OR GLUCOSE <1000 mg/dL.  Fetal fibronectin     Status: None   Collection Time: 06/30/14  5:40 PM  Result Value Ref Range   Fetal Fibronectin NEGATIVE NEGATIVE  Fern Test     Status: None   Collection Time: 06/30/14  5:45 PM  Result Value Ref Range   POCT Fern Test Negative = intact amniotic membranes     Assessment and Plan  ?ROM 3641w5d-Vaginal discharge BV- continue Flagyl F[facility] Return sooner if increase in pain/bleeding  [name],Sharon Townsend 06/30/2014, 5:21 PM

## 2014-06-30 NOTE — MAU Note (Signed)
@   [id number] RN entered room. Pt had her hands on her abdomen and was grimacing. Pt's family reports that the pt. was having a contraction. Abdomen palpated soft. While RN @ bedside until 1900 pt had no uterine activity.

## 2014-07-05 ENCOUNTER — Inpatient Hospital Stay (HOSPITAL_COMMUNITY)
Admission: EM | Admit: 2014-07-05 | Discharge: 2014-07-06 | Disposition: A | Discharge status: Home / Self Care | Payer: Medicaid Other | Attending: Obstetrics | Admitting: Obstetrics

## 2014-07-05 ENCOUNTER — Encounter (HOSPITAL_COMMUNITY): Payer: Self-pay | Admitting: Emergency Medicine

## 2014-07-05 DIAGNOSIS — Z3A31 31 weeks gestation of pregnancy: Secondary | ICD-10-CM | POA: Diagnosis not present

## 2014-07-05 DIAGNOSIS — W109XXA Fall (on) (from) unspecified stairs and steps, initial encounter: Secondary | ICD-10-CM | POA: Insufficient documentation

## 2014-07-05 DIAGNOSIS — R109 Unspecified abdominal pain: Secondary | ICD-10-CM | POA: Insufficient documentation

## 2014-07-05 DIAGNOSIS — R1084 Generalized abdominal pain: Secondary | ICD-10-CM | POA: Diagnosis not present

## 2014-07-05 DIAGNOSIS — W19XXXA Unspecified fall, initial encounter: Secondary | ICD-10-CM

## 2014-07-05 DIAGNOSIS — O9989 Other specified diseases and conditions complicating pregnancy, childbirth and the puerperium: Secondary | ICD-10-CM | POA: Insufficient documentation

## 2014-07-05 DIAGNOSIS — Z3490 Encounter for supervision of normal pregnancy, unspecified, unspecified trimester: Secondary | ICD-10-CM

## 2014-07-05 LAB — COMPREHENSIVE METABOLIC PANEL
ALT: 8 U/L (ref 0–35)
AST: 21 U/L (ref 0–37)
Albumin: 2.7 g/dL — ABNORMAL LOW (ref 3.5–5.2)
Alkaline Phosphatase: 103 U/L (ref 39–117)
Anion gap: 11 (ref 5–15)
BUN: 8 mg/dL (ref 6–23)
CHLORIDE: 108 mmol/L (ref 96–112)
CO2: 19 mmol/L (ref 19–32)
Calcium: 9.2 mg/dL (ref 8.4–10.5)
Creatinine, Ser: 0.71 mg/dL (ref 0.50–1.10)
GFR calc Af Amer: >90 mL/min (ref >90)
GFR calc non Af Amer: >90 mL/min (ref >90)
Glucose, Bld: 98 mg/dL (ref 70–99)
POTASSIUM: 4 mmol/L (ref 3.5–5.1)
Sodium: 138 mmol/L (ref 135–145)
Total Bilirubin: 0.5 mg/dL (ref 0.3–1.2)
Total Protein: 5.3 g/dL — ABNORMAL LOW (ref 6.0–8.3)

## 2014-07-05 MED ORDER — SODIUM CHLORIDE 0.9 % IV BOLUS (SEPSIS)
500.0000 mL | Freq: Once | INTRAVENOUS | Status: AC
  Administered 2014-07-05: 500 mL via INTRAVENOUS

## 2014-07-05 NOTE — Progress Notes (Signed)
   07/05/14 2000  Clinical Encounter Type  Visited With Health care provider  Visit Type Initial;ED;Trauma   Chaplain responded to a level 2 trauma at 7:26 PM. Chaplain was not able to provide immediate support but did check in with the ED secretary. No chaplain support was needed at that time. Page Merrilyn Puman-Call chaplain if patient or patient's family needs support tonight.  Cranston NeighborStrother, Avrum Kimball R, Chaplain  8:55 PM

## 2014-07-05 NOTE — ED Notes (Signed)
Contacted [facility]; Sharon Townsend receiving

## 2014-07-05 NOTE — MAU Note (Signed)
[facility]  Pt. Larey SeatFell down the stairs, pt. States that she is having some cramping, denies any bleeding.

## 2014-07-05 NOTE — ED Provider Notes (Signed)
CSN: [id number]     Arrival date & time 07/05/14  1931 History   First MD Initiated Contact with Patient 07/05/14 1935     Chief Complaint  Patient presents with  . Fall    Level II     (Consider location/radiation/quality/duration/timing/severity/associated sxs/prior Treatment) Patient is a 24 y.o. female presenting with fall.  Fall Associated symptoms include abdominal pain. Pertinent negatives include no chest pain, no headaches and no shortness of breath.   patient came in as a level II trauma. She is [redacted] weeks pregnant and fell down around 8 stairs. States she just lost her balance and landed on her right abdomen. Complaining of pain on the right side or abdomen. States she is not felt her baby move in the last hour. No vaginal bleeding or discharge. Megestrol. No trouble breathing. No numbness or weakness. No head or neck pain. Her blood type is O-. Rapid response OB nurse coming to see patient.  Past Medical History  Diagnosis Date  . Chronic constipation   . Bipolar disorder   . Renal disorder    Past Surgical History  Procedure Laterality Date  . Renal stents    . Tonsillectomy    . Mandible surgery     Family History  Problem Relation Age of Onset  . Diabetes Mother   . Diabetes Father   . Hypertension Father    History  Substance Use Topics  . Smoking status: Never Smoker   . Smokeless tobacco: Not on file  . Alcohol Use: No     Comment: before preg   OB History    Gravida Para Term Preterm AB TAB SAB Ectopic Multiple Living   3 0   2  2        Review of Systems  Constitutional: Negative for activity change and appetite change.  Eyes: Negative for pain.  Respiratory: Negative for chest tightness and shortness of breath.   Cardiovascular: Negative for chest pain and leg swelling.  Gastrointestinal: Positive for abdominal pain. Negative for nausea, vomiting and diarrhea.  Genitourinary: Negative for flank pain.  Musculoskeletal: Negative for back pain and  neck stiffness.  Skin: Negative for rash.  Neurological: Negative for weakness, numbness and headaches.  Psychiatric/Behavioral: Negative for behavioral problems.      Allergies  Peanuts; Latex; and Penicillins  Home Medications   Prior to Admission medications   Medication Sig Start Date End Date Taking? Authorizing Provider  Prenat w/o A Vit-FeFum-FePo-FA (CONCEPT OB) 130-92.4-1 MG CAPS Take 1 tablet by mouth daily. 12/26/13  Yes Dorathy KinsmanVirginia Smith, CNM  metroNIDAZOLE (FLAGYL) 500 MG tablet Take 1 tablet (500 mg total) by mouth 2 (two) times daily. 06/22/14   Marny LowensteinJulie N Wenzel, PA-C   BP 124/77 mmHg  Pulse 76  Temp(Src) 98.2 F (36.8 C) (Oral)  Resp 18  SpO2 100%  LMP 11/27/2013 Physical Exam  Constitutional: She appears well-developed and well-nourished.  HENT:  Head: Normocephalic and atraumatic.  Eyes: Pupils are equal, round, and reactive to light.  Neck: Neck supple.  Cardiovascular: Normal rate and regular rhythm.   Pulmonary/Chest: Effort normal.  Abdominal: She exhibits mass. There is tenderness.  Right-sided abdominal tenderness. Large gravid abdomen to well above umbilicus.  Musculoskeletal: Normal range of motion.  Neurological: She is alert.  Skin: Skin is warm.    ED Course  Procedures (including critical care time) Labs Review Labs Reviewed  COMPREHENSIVE METABOLIC PANEL - Abnormal; Notable for the following:    Total Protein 5.3 (*)  Albumin 2.7 (*)    All other components within normal limits  CBC WITH DIFFERENTIAL/PLATELET  KLEIHAUER-BETKE STAIN  URINALYSIS, ROUTINE W REFLEX MICROSCOPIC    Imaging Review No results found.   EKG Interpretation None      MDM   Final diagnoses:  Fall, initial encounter  Pregnant  Abdominal pain, unspecified abdominal location    Patient with fall. Right-sided abdominal pain and is pregnant. His been seen by rapid response OB nurse. Reassuring heart tones. No contractions. Will be transferred to [facility]  hospital.    Benjiman Core, MD 07/05/14 [id number]

## 2014-07-05 NOTE — ED Notes (Signed)
Rapid OB RN called and en route to ED.

## 2014-07-05 NOTE — ED Notes (Signed)
Patient fell down 6 stairs, tumbled and landed on right side of abdomen.  Patient is high risk pregnancy, has had a stillbirth in the past and miscarriage in past.  Patient does have abdominal on right side.  Patient has not felt the baby move in an hour.  No bruising to abdomen.  Patient is having cramping feeling where she landed on abdomen.

## 2014-07-06 DIAGNOSIS — R1084 Generalized abdominal pain: Secondary | ICD-10-CM | POA: Diagnosis not present

## 2014-07-06 DIAGNOSIS — Z3A31 31 weeks gestation of pregnancy: Secondary | ICD-10-CM | POA: Diagnosis not present

## 2014-07-06 DIAGNOSIS — W109XXA Fall (on) (from) unspecified stairs and steps, initial encounter: Secondary | ICD-10-CM | POA: Diagnosis not present

## 2014-07-06 LAB — KLEIHAUER-BETKE STAIN
# Vials RhIg: 1
FETAL CELLS %: 0 %
QUANTITATION FETAL HEMOGLOBIN: 0 mL

## 2014-07-06 NOTE — MAU Provider Note (Signed)
History     CSN: [id number]  Arrival date and time: 07/05/14 1931   None     Chief Complaint  Patient presents with  . Fall    Level II   HPI Comments: Sharon Townsend is a 24 y.o. G3P0020 at 1923w4d who presents today after a fall. She states that she fell down 6 stairs around 1900 today. She fell onto her buttocks and right side. She denies any VB or LOF. She states that the fetus has been active.   Fall The accident occurred 3 to 6 hours ago. Fall occurred: down 6 stairs  She fell from an unknown height. She landed on carpet. There was no blood loss. The point of impact was the buttocks and right hip. Pain location: no pain at this time. The pain is at a severity of 0/10. The patient is experiencing no pain. Associated symptoms include headaches.    Past Medical History  Diagnosis Date  . Chronic constipation   . Bipolar disorder   . Renal disorder     Past Surgical History  Procedure Laterality Date  . Renal stents    . Tonsillectomy    . Mandible surgery      Family History  Problem Relation Age of Onset  . Diabetes Mother   . Diabetes Father   . Hypertension Father     History  Substance Use Topics  . Smoking status: Never Smoker   . Smokeless tobacco: Not on file  . Alcohol Use: No     Comment: before preg    Allergies:  Allergies  Allergen Reactions  . Peanuts [Peanut Oil] Anaphylaxis  . Latex Swelling  . Penicillins Other (See Comments)    Reaction:  "shuts down bowel movements"    Prescriptions prior to admission  Medication Sig Dispense Refill Last Dose  . Prenat w/o A Vit-FeFum-FePo-FA (CONCEPT OB) 130-92.4-1 MG CAPS Take 1 tablet by mouth daily. 30 capsule 12 07/05/2014 at Unknown time  . metroNIDAZOLE (FLAGYL) 500 MG tablet Take 1 tablet (500 mg total) by mouth 2 (two) times daily. 14 tablet 0 06/30/2014 at Unknown time    Review of Systems  Eyes: Negative for blurred vision.  Neurological: Positive for headaches. Negative for dizziness.    Physical Exam   Blood pressure 124/77, pulse 76, temperature 98.2 F (36.8 C), temperature source Oral, resp. rate 18, last menstrual period 11/27/2013, SpO2 100 %.  Physical Exam  Nursing note and vitals reviewed. Constitutional: She is oriented to person, place, and time. She appears well-developed and well-nourished. No distress.  Cardiovascular: Normal rate.   Respiratory: Effort normal.  GI: Soft. There is no tenderness.  Neurological: She is alert and oriented to person, place, and time.  Skin: Skin is warm and dry.  Psychiatric: She has a normal mood and affect.   FHT 145, moderate with 15x15 accels, no decels Toco: no UCs   Results for orders placed or performed during the hospital encounter of 07/05/14 (from the past 24 hour(s))  Comprehensive metabolic panel     Status: Abnormal   Collection Time: 07/05/14  9:00 PM  Result Value Ref Range   Sodium 138 135 - 145 mmol/L   Potassium 4.0 3.5 - 5.1 mmol/L   Chloride 108 96 - 112 mmol/L   CO2 19 19 - 32 mmol/L   Glucose, Bld 98 70 - 99 mg/dL   BUN 8 6 - 23 mg/dL   Creatinine, Ser 4.090.71 0.50 - 1.10 mg/dL   Calcium 9.2  8.4 - 10.5 mg/dL   Total Protein 5.3 (L) 6.0 - 8.3 g/dL   Albumin 2.7 (L) 3.5 - 5.2 g/dL   AST 21 0 - 37 U/L   ALT 8 0 - 35 U/L   Alkaline Phosphatase 103 39 - 117 U/L   Total Bilirubin 0.5 0.3 - 1.2 mg/dL   GFR calc non Af Amer >90 >90 mL/min   GFR calc Af Amer >90 >90 mL/min   Anion gap 11 5 - 15   MAU Course  Procedures  MDM 0030: Accident was >4 hours ago. NST is reactive with no UCs seen.   Assessment and Plan   1. Fall, initial encounter   2. Pregnant   3. Abdominal pain, unspecified abdominal location    DC home Abruption precautions reviewed Return to MAU as needed  Follow-up Information    Follow up with Kathreen Cosier, MD.   Specialty:  Obstetrics and Gynecology   Why:  As scheduled   Contact information:   [address] [place] [address] [phone]         Tawnya Crook 07/06/2014, 12:11 AM

## 2014-07-06 NOTE — Discharge Instructions (Signed)
Placental Abruption °Your placenta is the organ that nourishes your unborn baby (fetus). Your baby gets his or her blood supply and nutrients through your placenta. It is your baby's life support system. It is attached to the inside of your uterus until after your baby is born.  °Placental abruption is when the placenta partly or completely separates from the uterus before your baby is born. This is rare, but it can happen any time after 20 weeks of pregnancy. A small separation may not cause problems, but a large separation may be dangerous for you and your baby. °CAUSES  °Most of the time the cause of a placental abruption is unknown. Though it is rare, a placental abruption can be caused by:  °· An abdominal injury.   °· The baby turning from a buttocks-first position (breech presentation) or a sideways position (transverse) to a headfirst position (cephalic).   °· Delivering the first of multiple babies (twins, triplets, or more).   °· Sudden loss of amniotic fluid (premature rupture of the membranes).   °· An abnormally short umbilical cord. °RISK FACTORS °Some risk factors make a placental abruption more likely, including: °· History of placental abruption. °· High blood pressure (hypertension). °· Smoking. °· Alcohol intake. °· Blood clotting problems. °· Too much amniotic fluid. °· Having had multiples (twins or triplets or more). °· Seizures and convulsions. °· Diabetes mellitus. °· Having had more than four children. °· Age 35 years or older. °· Illegal drug use. °· Injury to your abdomen. °SIGNS AND SYMPTOMS  °A small placental abruption may not cause symptoms. If you do have symptoms, they may include: °· Mild abdominal pain. °· Slight vaginal bleeding. °Symptoms of severe placental abruption depend on the size of the separation and the stage of pregnancy. Symptoms may include:  °· Sudden pain in your uterus. °· Abdominal pain. °· Vaginal bleeding. °· Tender uterus. °· Severe abdominal pain with  tenderness. °· Continual contractions of your uterus. °· Back pain. °· Weakness, light-headedness. °DIAGNOSIS  °Placental abruption is suspected when a pregnant woman develops sudden pain in her uterus. The health care provider will check whether the uterus is very tender, hard, and enlarging and whether the baby has an abnormal heart rate or rhythm. Ultrasonography (commonly called an ultrasound) will be done. Blood work will also be done to make sure that there are enough healthy red blood cells and that there are no clotting problems or signs of too much blood loss. °TREATMENT  °Placental abruption is usually an emergency. It requires treatment right away. Your treatment will depend on:  °· The amount of bleeding. °· Whether you or you baby are in distress. °· The stage of your pregnancy. °· The maturity of the baby. °Treatment for partial separation of the placenta is bed rest and close observation. You also may need a blood transfusion or to receive fluids through an IV tube. Treatment for complete placental separation is delivery of your baby. You may have a cesarean delivery if your baby is in distress. °HOME CARE INSTRUCTIONS  °· Only take medicines as directed by your health care provider. °· Arrange for help at home before and after you deliver the baby, especially if you had a cesarean delivery or lost a lot of blood. °· Get plenty of rest and sleep. °· Do not have sexual intercourse until your health care provider says it is okay. °· Do not use tampons or douche unless your health care provider says it is okay. °SEEK MEDICAL CARE IF: °· You   have light vaginal bleeding or spotting. °· You have any type of trauma, such as a fall or jolt during an accident. °· You are having trouble avoiding drugs, alcohol, or smoking. °SEEK IMMEDIATE MEDICAL CARE IF: °· You have vaginal bleeding. °· You have abdominal pain. °· You have continuous uterine contractions. °· You have a hard, tender uterus. °· You do not feel  the baby move, or the baby moves very little. °MAKE SURE YOU: °· Understand these instructions. °· Will watch your condition. °· Will get help right away if you are not doing well or get worse. °Document Released: 03/16/2005 Document Revised: 03/21/2013 Document Reviewed: 01/06/2013 °Exit[employer]® Patient Information ©2015 [employer]. This information is not intended to replace advice given to you by your health care provider. Make sure you discuss any questions you have with your health care provider. ° °

## 2014-07-22 ENCOUNTER — Inpatient Hospital Stay (HOSPITAL_COMMUNITY)
Admission: AD | Admit: 2014-07-22 | Discharge: 2014-07-22 | Disposition: A | Discharge status: Home / Self Care | Payer: Medicaid Other | Source: Ambulatory Visit | Attending: Obstetrics | Admitting: Obstetrics

## 2014-07-22 ENCOUNTER — Encounter (HOSPITAL_COMMUNITY): Payer: Self-pay | Admitting: *Deleted

## 2014-07-22 DIAGNOSIS — O99613 Diseases of the digestive system complicating pregnancy, third trimester: Secondary | ICD-10-CM | POA: Insufficient documentation

## 2014-07-22 DIAGNOSIS — Z3A33 33 weeks gestation of pregnancy: Secondary | ICD-10-CM | POA: Insufficient documentation

## 2014-07-22 DIAGNOSIS — A084 Viral intestinal infection, unspecified: Secondary | ICD-10-CM | POA: Diagnosis not present

## 2014-07-22 DIAGNOSIS — O212 Late vomiting of pregnancy: Secondary | ICD-10-CM | POA: Diagnosis present

## 2014-07-22 LAB — URINALYSIS, ROUTINE W REFLEX MICROSCOPIC
Bilirubin Urine: NEGATIVE
GLUCOSE, UA: NEGATIVE mg/dL
Hgb urine dipstick: NEGATIVE
Ketones, ur: NEGATIVE mg/dL
Leukocytes, UA: NEGATIVE
Nitrite: NEGATIVE
Protein, ur: NEGATIVE mg/dL
Specific Gravity, Urine: 1.025 (ref 1.005–1.030)
UROBILINOGEN UA: 0.2 mg/dL (ref 0.0–1.0)
pH: 5.5 (ref 5.0–8.0)

## 2014-07-22 MED ORDER — ONDANSETRON 8 MG PO TBDP
8.0000 mg | ORAL_TABLET | Freq: Once | ORAL | Status: AC
  Administered 2014-07-22: 8 mg via ORAL
  Filled 2014-07-22: qty 1

## 2014-07-22 MED ORDER — ONDANSETRON HCL 4 MG PO TABS: 4.0000 mg | ORAL_TABLET | Freq: Four times a day (QID) | ORAL | Status: DC

## 2014-07-22 NOTE — MAU Note (Signed)
Pt has been vomiting and had diarrhea for the last three days.  Has felt some abdominal cramping. Denies LOF.VB.

## 2014-07-22 NOTE — MAU Provider Note (Signed)
History     CSN: [id number]  Arrival date and time: 07/22/14 1843   First Provider Initiated Contact with Patient 07/22/14 1904      Chief Complaint  Patient presents with  . Nausea  . Emesis  . Diarrhea   HPI  Ms. Sharon Townsend is a 24 y.o. G3P0020 at [redacted]w[redacted]d who presents to MAU today with complaint of N/V/D x 3 days. The patient states that she had 4 watery stools today and 2 episodes of emesis. She denies sick contacts initially, but later states that nephew has same symptoms. She denies fever. She tried Pepto Bismol without relief. She states associated mild diffuse abdominal cramping. She states ~ 4-5 contractions/hour. She denies vaginal bleeding or LOF. She denies complications with the pregnancy.   OB History    Gravida Para Term Preterm AB TAB SAB Ectopic Multiple Living   3 0   2  2         Past Medical History  Diagnosis Date  . Chronic constipation   . Bipolar disorder   . Renal disorder     Past Surgical History  Procedure Laterality Date  . Renal stents    . Tonsillectomy    . Mandible surgery      Family History  Problem Relation Age of Onset  . Diabetes Mother   . Diabetes Father   . Hypertension Father     History  Substance Use Topics  . Smoking status: Never Smoker   . Smokeless tobacco: Not on file  . Alcohol Use: No     Comment: before preg    Allergies:  Allergies  Allergen Reactions  . Peanuts [Peanut Oil] Anaphylaxis  . Latex Swelling  . Penicillins Other (See Comments)    Reaction:  Pt states that it "shuts down her bowel movements".    No prescriptions prior to admission    Review of Systems  Constitutional: Negative for fever and malaise/fatigue.  Gastrointestinal: Positive for nausea, vomiting, abdominal pain and diarrhea. Negative for constipation.  Genitourinary: Negative for dysuria, urgency and frequency.       Neg - vaginal bleeding, discharge, LOF   Physical Exam   Blood pressure 126/84, pulse 76,  temperature 98.8 F (37.1 C), temperature source Oral, resp. rate 18, last menstrual period 11/27/2013.  Physical Exam  Constitutional: She is oriented to person, place, and time. She appears well-developed and well-nourished. No distress.  HENT:  Head: Normocephalic.  Cardiovascular: Normal rate.   Respiratory: Effort normal.  GI: Soft. Bowel sounds are normal. She exhibits no distension and no mass. There is no tenderness. There is no rebound and no guarding.  Neurological: She is alert and oriented to person, place, and time.  Skin: Skin is warm and dry. No erythema.  Psychiatric: She has a normal mood and affect.   Results for orders placed or performed during the hospital encounter of 07/22/14 (from the past 24 hour(s))  Urinalysis, Routine w reflex microscopic     Status: None   Collection Time: 07/22/14  6:43 PM  Result Value Ref Range   Color, Urine YELLOW YELLOW   APPearance CLEAR CLEAR   Specific Gravity, Urine 1.025 1.005 - 1.030   pH 5.5 5.0 - 8.0   Glucose, UA NEGATIVE NEGATIVE mg/dL   Hgb urine dipstick NEGATIVE NEGATIVE   Bilirubin Urine NEGATIVE NEGATIVE   Ketones, ur NEGATIVE NEGATIVE mg/dL   Protein, ur NEGATIVE NEGATIVE mg/dL   Urobilinogen, UA 0.2 0.0 - 1.0 mg/dL   Nitrite  NEGATIVE NEGATIVE   Leukocytes, UA NEGATIVE NEGATIVE    Fetal Monitoring:  Baseline: 140 bpm, moderate variability, + accelerations, no decelerations Contractions: none, mild UI occasionally  MAU Course  Procedures None  MDM UA today shows no signs of dehydration Discussed with patient. ODT Zofran ordered.  Patient able to tolerate PO in MAU. No emesis or diarrhea noted during visit.   Assessment and Plan  A: SIUP at 7235w6d Viral gastroenteritis  P: Discharge home Rx for Zofran sent to patient's pharmacy Preterm labor precautions discussed Increase PO hydration and advance diet as tolerated. BRAT diet discussed Advised Imodium PRN for loose stools Patient advised to  follow-up with Dr. Gaynell FaceMarshall as scheduled or sooner if symptoms worsen Patient may return to MAU as needed or if her condition were to change or worsen   Marny LowensteinJulie N Lalia Loudon, PA-C  07/22/2014, 8:28 PM

## 2014-07-22 NOTE — Discharge Instructions (Signed)

## 2014-08-01 ENCOUNTER — Inpatient Hospital Stay (HOSPITAL_COMMUNITY)
Admission: AD | Admit: 2014-08-01 | Discharge: 2014-08-05 | DRG: 774 | Disposition: A | Discharge status: Home / Self Care | Payer: Medicaid Other | Source: Ambulatory Visit | Attending: Obstetrics | Admitting: Obstetrics

## 2014-08-01 ENCOUNTER — Encounter (HOSPITAL_COMMUNITY): Payer: Self-pay | Admitting: *Deleted

## 2014-08-01 DIAGNOSIS — O169 Unspecified maternal hypertension, unspecified trimester: Secondary | ICD-10-CM | POA: Diagnosis present

## 2014-08-01 DIAGNOSIS — Z3A35 35 weeks gestation of pregnancy: Secondary | ICD-10-CM | POA: Insufficient documentation

## 2014-08-01 DIAGNOSIS — Z3689 Encounter for other specified antenatal screening: Secondary | ICD-10-CM | POA: Insufficient documentation

## 2014-08-01 DIAGNOSIS — N289 Disorder of kidney and ureter, unspecified: Secondary | ICD-10-CM | POA: Diagnosis present

## 2014-08-01 DIAGNOSIS — Z91040 Latex allergy status: Secondary | ICD-10-CM

## 2014-08-01 DIAGNOSIS — Z880 Allergy status to penicillin: Secondary | ICD-10-CM

## 2014-08-01 DIAGNOSIS — O1413 Severe pre-eclampsia, third trimester: Secondary | ICD-10-CM | POA: Insufficient documentation

## 2014-08-01 DIAGNOSIS — O99344 Other mental disorders complicating childbirth: Secondary | ICD-10-CM | POA: Diagnosis present

## 2014-08-01 DIAGNOSIS — Z23 Encounter for immunization: Secondary | ICD-10-CM

## 2014-08-01 DIAGNOSIS — O9989 Other specified diseases and conditions complicating pregnancy, childbirth and the puerperium: Secondary | ICD-10-CM | POA: Diagnosis present

## 2014-08-01 DIAGNOSIS — O1490 Unspecified pre-eclampsia, unspecified trimester: Secondary | ICD-10-CM

## 2014-08-01 DIAGNOSIS — O163 Unspecified maternal hypertension, third trimester: Secondary | ICD-10-CM

## 2014-08-01 DIAGNOSIS — O1493 Unspecified pre-eclampsia, third trimester: Principal | ICD-10-CM | POA: Diagnosis present

## 2014-08-01 DIAGNOSIS — O368130 Decreased fetal movements, third trimester, not applicable or unspecified: Secondary | ICD-10-CM | POA: Diagnosis present

## 2014-08-01 DIAGNOSIS — F319 Bipolar disorder, unspecified: Secondary | ICD-10-CM | POA: Diagnosis present

## 2014-08-01 DIAGNOSIS — Z91010 Allergy to peanuts: Secondary | ICD-10-CM

## 2014-08-01 NOTE — MAU Note (Signed)
Pt states that she has not felt the baby move, and started having pelvic and rectal pressure since 4pm today. Denies VB, possible LOF around 8pm."my panties felt wet" She is not wearing a pad or having LOF currently.

## 2014-08-01 NOTE — MAU Note (Signed)
Pt states the baby has not moved since 4pm, pressure in lower abd since that time also. FHT's in triage 151.

## 2014-08-01 NOTE — MAU Provider Note (Signed)
History     CSN: [id number]  Arrival date and time: 08/01/14 2246   First Provider Initiated Contact with Patient 08/01/14 2348      Chief Complaint  Patient presents with  . Decreased Fetal Movement    no fetal movement since 4pm today   HPI Comments: Sharon Townsend is a 24 y.o. G3P0020 at 8134w2d who presents today with decreased fetal movement, leaking fluid and pelvic pressure. She denies any vaginal bleeding or contractions. She states that since she has been on the monitor here the baby has been active. She states that her next appointment with Dr. Gaynell FaceMarshall is on 08/07/14.   Incidentally today she has elevated blood pressure. She denies any HA or visual disturbances. She has had RUQ pain.   Pelvic Pain The patient's primary symptoms include pelvic pain (pressure). This is a new problem. The current episode started today. The problem occurs constantly. The problem has been unchanged. She is pregnant. Associated symptoms include abdominal pain (RUQ pain ). Pertinent negatives include no constipation, diarrhea, dysuria, frequency, headaches, nausea, urgency or vomiting. The vaginal discharge was watery. There has been no bleeding. Nothing aggravates the symptoms. She has tried nothing for the symptoms.    Past Medical History  Diagnosis Date  . Chronic constipation   . Bipolar disorder   . Renal disorder     Past Surgical History  Procedure Laterality Date  . Renal stents    . Tonsillectomy    . Mandible surgery      Family History  Problem Relation Age of Onset  . Diabetes Mother   . Diabetes Father   . Hypertension Father     History  Substance Use Topics  . Smoking status: Never Smoker   . Smokeless tobacco: Not on file  . Alcohol Use: No     Comment: before preg    Allergies:  Allergies  Allergen Reactions  . Peanuts [Peanut Oil] Anaphylaxis  . Latex Swelling  . Penicillins Other (See Comments)    Reaction:  Pt states that it "shuts down her bowel  movements".    Prescriptions prior to admission  Medication Sig Dispense Refill Last Dose  . Prenat w/o A Vit-FeFum-FePo-FA (CONCEPT OB) 130-92.4-1 MG CAPS Take 1 tablet by mouth daily. 30 capsule 12 08/01/2014 at Unknown time  . ondansetron (ZOFRAN) 4 MG tablet Take 1 tablet (4 mg total) by mouth every 6 (six) hours. 12 tablet 0     Review of Systems  Eyes: Negative for blurred vision.  Gastrointestinal: Positive for abdominal pain (RUQ pain ). Negative for nausea, vomiting, diarrhea and constipation.  Genitourinary: Positive for pelvic pain (pressure). Negative for dysuria, urgency and frequency.  Neurological: Negative for headaches.   Physical Exam   Blood pressure 140/91, pulse 77, temperature 98.4 F (36.9 C), temperature source Oral, resp. rate 18, height 5\' 1"  (1.549 m), weight 82.555 kg (182 lb), last menstrual period 11/27/2013, SpO2 98 %.  Physical Exam  Nursing note and vitals reviewed. Constitutional: She is oriented to person, place, and time. She appears well-developed and well-nourished. No distress.  HENT:  Head: Normocephalic.  Cardiovascular: Normal rate.   Respiratory: Effort normal.  Neurological: She is alert and oriented to person, place, and time.  Skin: Skin is warm and dry.  Psychiatric: She has a normal mood and affect.   FHT 145, moderate with 15x15 accels, no decels Toco: no UCs  MAU Course  Procedures  MDM 0144: D/W Dr. Gaynell FaceMarshall, will admit to ante at  this time.   Assessment and Plan   1. Hypertension complicating pregnancy, third trimester    Admit to ante 24 hour urine collection MFM consult Mag 4 gram bolus/2 gram/hour     Tawnya CrookHogan, Heather Donovan 08/01/2014, 11:50 PM

## 2014-08-02 ENCOUNTER — Encounter (HOSPITAL_COMMUNITY): Payer: Self-pay | Admitting: *Deleted

## 2014-08-02 ENCOUNTER — Inpatient Hospital Stay (HOSPITAL_COMMUNITY): Payer: Medicaid Other

## 2014-08-02 DIAGNOSIS — O368130 Decreased fetal movements, third trimester, not applicable or unspecified: Secondary | ICD-10-CM | POA: Diagnosis present

## 2014-08-02 DIAGNOSIS — O169 Unspecified maternal hypertension, unspecified trimester: Secondary | ICD-10-CM | POA: Diagnosis present

## 2014-08-02 DIAGNOSIS — Z3A35 35 weeks gestation of pregnancy: Secondary | ICD-10-CM | POA: Insufficient documentation

## 2014-08-02 DIAGNOSIS — Z91010 Allergy to peanuts: Secondary | ICD-10-CM | POA: Diagnosis not present

## 2014-08-02 DIAGNOSIS — O99344 Other mental disorders complicating childbirth: Secondary | ICD-10-CM | POA: Diagnosis present

## 2014-08-02 DIAGNOSIS — F319 Bipolar disorder, unspecified: Secondary | ICD-10-CM | POA: Diagnosis present

## 2014-08-02 DIAGNOSIS — O1493 Unspecified pre-eclampsia, third trimester: Secondary | ICD-10-CM | POA: Diagnosis present

## 2014-08-02 DIAGNOSIS — Z3689 Encounter for other specified antenatal screening: Secondary | ICD-10-CM | POA: Insufficient documentation

## 2014-08-02 DIAGNOSIS — O163 Unspecified maternal hypertension, third trimester: Secondary | ICD-10-CM

## 2014-08-02 DIAGNOSIS — Z880 Allergy status to penicillin: Secondary | ICD-10-CM | POA: Diagnosis not present

## 2014-08-02 DIAGNOSIS — N289 Disorder of kidney and ureter, unspecified: Secondary | ICD-10-CM | POA: Diagnosis present

## 2014-08-02 DIAGNOSIS — O9989 Other specified diseases and conditions complicating pregnancy, childbirth and the puerperium: Secondary | ICD-10-CM | POA: Diagnosis present

## 2014-08-02 DIAGNOSIS — Z91040 Latex allergy status: Secondary | ICD-10-CM | POA: Diagnosis not present

## 2014-08-02 DIAGNOSIS — O1413 Severe pre-eclampsia, third trimester: Secondary | ICD-10-CM | POA: Insufficient documentation

## 2014-08-02 DIAGNOSIS — Z23 Encounter for immunization: Secondary | ICD-10-CM | POA: Diagnosis not present

## 2014-08-02 LAB — URINALYSIS, ROUTINE W REFLEX MICROSCOPIC
Bilirubin Urine: NEGATIVE
Glucose, UA: 250 mg/dL — AB
HGB URINE DIPSTICK: NEGATIVE
Ketones, ur: NEGATIVE mg/dL
Leukocytes, UA: NEGATIVE
Nitrite: NEGATIVE
PROTEIN: NEGATIVE mg/dL
SPECIFIC GRAVITY, URINE: 1.01 (ref 1.005–1.030)
Urobilinogen, UA: 0.2 mg/dL (ref 0.0–1.0)
pH: 6 (ref 5.0–8.0)

## 2014-08-02 LAB — PROTEIN / CREATININE RATIO, URINE
CREATININE, URINE: 43 mg/dL
Protein Creatinine Ratio: 1.37 mg/mg{Cre} — ABNORMAL HIGH (ref 0.00–0.15)
Total Protein, Urine: 59 mg/dL

## 2014-08-02 LAB — CBC
HEMATOCRIT: 35.4 % — AB (ref 36.0–46.0)
Hemoglobin: 11.9 g/dL — ABNORMAL LOW (ref 12.0–15.0)
MCH: 29 pg (ref 26.0–34.0)
MCHC: 33.6 g/dL (ref 30.0–36.0)
MCV: 86.3 fL (ref 78.0–100.0)
PLATELETS: 147 10*3/uL — AB (ref 150–400)
RBC: 4.1 MIL/uL (ref 3.87–5.11)
RDW: 13.6 % (ref 11.5–15.5)
WBC: 8.3 10*3/uL (ref 4.0–10.5)

## 2014-08-02 LAB — COMPREHENSIVE METABOLIC PANEL
ALBUMIN: 3.2 g/dL — AB (ref 3.5–5.0)
ALT: 11 U/L — ABNORMAL LOW (ref 14–54)
AST: 20 U/L (ref 15–41)
Alkaline Phosphatase: 138 U/L — ABNORMAL HIGH (ref 38–126)
Anion gap: 8 (ref 5–15)
BUN: 12 mg/dL (ref 6–20)
CHLORIDE: 109 mmol/L (ref 101–111)
CO2: 21 mmol/L — AB (ref 22–32)
Calcium: 8.9 mg/dL (ref 8.9–10.3)
Creatinine, Ser: 0.78 mg/dL (ref 0.44–1.00)
GFR calc Af Amer: >60 mL/min (ref >60)
GFR calc non Af Amer: >60 mL/min (ref >60)
Glucose, Bld: 117 mg/dL — ABNORMAL HIGH (ref 70–99)
Potassium: 3.7 mmol/L (ref 3.5–5.1)
Sodium: 138 mmol/L (ref 135–145)
TOTAL PROTEIN: 6.5 g/dL (ref 6.5–8.1)
Total Bilirubin: 0.3 mg/dL (ref 0.3–1.2)

## 2014-08-02 LAB — AMNISURE RUPTURE OF MEMBRANE (ROM) NOT AT ARMC: Amnisure ROM: NEGATIVE

## 2014-08-02 LAB — OB RESULTS CONSOLE GBS: GBS: NEGATIVE

## 2014-08-02 LAB — URIC ACID: URIC ACID, SERUM: 7.3 mg/dL — AB (ref 2.3–6.6)

## 2014-08-02 MED ORDER — TERBUTALINE SULFATE 1 MG/ML IJ SOLN: 0.2500 mg | Freq: Once | INTRAMUSCULAR | Status: AC | PRN

## 2014-08-02 MED ORDER — OXYTOCIN 40 UNITS IN LACTATED RINGERS INFUSION - SIMPLE MED
INTRAVENOUS | Status: AC
  Administered 2014-08-02: 2 [IU]
  Filled 2014-08-02: qty 1000

## 2014-08-02 MED ORDER — CALCIUM CARBONATE ANTACID 500 MG PO CHEW: 2.0000 | CHEWABLE_TABLET | ORAL | Status: DC | PRN

## 2014-08-02 MED ORDER — ACETAMINOPHEN 325 MG PO TABS
650.0000 mg | ORAL_TABLET | ORAL | Status: DC | PRN
  Administered 2014-08-02 (×2): 650 mg via ORAL
  Filled 2014-08-02 (×2): qty 2

## 2014-08-02 MED ORDER — DOCUSATE SODIUM 100 MG PO CAPS: 100.0000 mg | ORAL_CAPSULE | Freq: Every day | ORAL | Status: DC

## 2014-08-02 MED ORDER — MAGNESIUM SULFATE 50 % IJ SOLN
2.0000 g/h | INTRAVENOUS | Status: DC
  Administered 2014-08-02 – 2014-08-03 (×3): 2 g/h via INTRAVENOUS
  Filled 2014-08-02 (×2): qty 80

## 2014-08-02 MED ORDER — ZOLPIDEM TARTRATE 5 MG PO TABS: 5.0000 mg | ORAL_TABLET | Freq: Every evening | ORAL | Status: DC | PRN

## 2014-08-02 MED ORDER — DIPHENHYDRAMINE HCL 50 MG/ML IJ SOLN
12.5000 mg | INTRAMUSCULAR | Status: DC | PRN
Start: 2014-08-02 — End: 2014-08-03

## 2014-08-02 MED ORDER — OXYTOCIN 40 UNITS IN LACTATED RINGERS INFUSION - SIMPLE MED
1.0000 m[IU]/min | INTRAVENOUS | Status: DC
  Administered 2014-08-03: 666 m[IU]/min via INTRAVENOUS
  Administered 2014-08-03: 30 m[IU]/min via INTRAVENOUS
  Filled 2014-08-02: qty 1000

## 2014-08-02 MED ORDER — FENTANYL 2.5 MCG/ML BUPIVACAINE 1/10 % EPIDURAL INFUSION (WH - ANES)
14.0000 mL/h | INTRAMUSCULAR | Status: DC | PRN
Start: 2014-08-02 — End: 2014-08-03
  Administered 2014-08-03 (×2): 14 mL/h via EPIDURAL
  Filled 2014-08-02: qty 125

## 2014-08-02 MED ORDER — MAGNESIUM SULFATE BOLUS VIA INFUSION
4.0000 g | Freq: Once | INTRAVENOUS | Status: AC
Start: 2014-08-02 — End: 2014-08-02
  Administered 2014-08-02: 4 g via INTRAVENOUS

## 2014-08-02 MED ORDER — PRENATAL MULTIVITAMIN CH: 1.0000 | ORAL_TABLET | Freq: Every day | ORAL | Status: DC

## 2014-08-02 MED ORDER — PHENYLEPHRINE 40 MCG/ML (10ML) SYRINGE FOR IV PUSH (FOR BLOOD PRESSURE SUPPORT)
80.0000 ug | PREFILLED_SYRINGE | INTRAVENOUS | Status: DC | PRN
  Filled 2014-08-02: qty 2
  Filled 2014-08-02: qty 20

## 2014-08-02 MED ORDER — LACTATED RINGERS IV SOLN
INTRAVENOUS | Status: DC
  Administered 2014-08-02: 100 mL/h via INTRAVENOUS
  Administered 2014-08-02: 23:00:00 via INTRAVENOUS

## 2014-08-02 MED ORDER — BUTORPHANOL TARTRATE 1 MG/ML IJ SOLN
1.0000 mg | INTRAMUSCULAR | Status: DC | PRN
  Administered 2014-08-02: 1 mg via INTRAVENOUS
  Filled 2014-08-02: qty 1

## 2014-08-02 MED ORDER — MAGNESIUM SULFATE 50 % IJ SOLN
2.0000 g/h | Freq: Once | INTRAVENOUS | Status: AC
  Filled 2014-08-02: qty 80

## 2014-08-02 MED ORDER — EPHEDRINE 5 MG/ML INJ
10.0000 mg | INTRAVENOUS | Status: DC | PRN
  Filled 2014-08-02: qty 2

## 2014-08-02 NOTE — Progress Notes (Signed)
Patient ID: Sharon FriedlanderBrittany R Mijangos, female   DOB: 1990-09-11, 24 y.o.   MRN: [id number] Cervix a fingertip 50% -3 amniotomy performed fluids clear she is on 12 milliunits of Pitocin in contracting every 3 minutes

## 2014-08-02 NOTE — H&P (Signed)
This is Dr. Francoise CeoBernard Sharon Townsend dictating the history and physical on  Sharon Townsend she's a 24 year old gravida 3 para 0020 at 35 weeks and 3 days [facility] patient came to the hospital  her baby was not moving and when she was seen her blood pressure was 152/97 her platelets were 14-6 and her cervix was long closed no sign of ruptured membranes and her  cmet  was normal she was started on  Magnesium sulfate  4 g loading and 2 g an hour to be seen in consult by MFM today Her past medical history was negative she has had uneventful antepartum course Past surgical history was negative Social history she denies smoking drinking or drug use System review she denies headaches shortness of breath chest pain but on admission she had some right upper quadrant pain which is now resolved she has no abdominal tenderness and her extremities are normal for pedal edema and her reflexes a normal otherwise system review negative Physical exam well-developed female in no distress HEENT negative Thyroid negative Breasts negative Heart regular rhythm no murmurs no gallops Lungs clear to P&A Abdomen is soft uterus is 34-36 week size Her cervix was long closed Extremities negative for pedal edema and her reflexes were normal Assessment elevated blood pressure at 35 weeks with epigastric pain The plan 24-hour urine collection for protein her creatinine protein creatinine ratio on admission was 1.7 Continuous monitoring MFM consult today

## 2014-08-02 NOTE — Progress Notes (Signed)
Pt to MFM for testing

## 2014-08-02 NOTE — Progress Notes (Signed)
MATERNAL FETAL MEDICINE CONSULT  Patient Name: Sharon Townsend Medical Record Number:  [id number] Date of Birth: 1990/04/12 Requesting Physician Name:  Sharon CosierBernard A Marshall, MD Date of Service: 08/02/2014  Chief Complaint Preelcampsia  History of Present Illness Sharon Townsend was seen today secondary to preeclampsai at the request of Sharon CosierBernard A Marshall, MD.  The patient is a 24 y.o. [id number],JYG3P0020,at 5529w3d with an EDD of 09/03/2014, by Last Menstrual Period dating method.  She noted decreased fetal movement starting yesterday morning.  She also reports headache and blurry vision at that time as well.  Both have persisted.  She denies vaginal bleeding, loss of fluid, or contractions.  The fetal movement continues to be less than usual.  Review of Systems Pertinent items are noted in HPI.  Patient History OB History  Gravida Para Term Preterm AB SAB TAB Ectopic Multiple Living  3 0   2 2        # Outcome Date GA Lbr Len/2nd Weight Sex Delivery Anes PTL Lv  3 Current           2 SAB           1 SAB               Past Medical History  Diagnosis Date  . Chronic constipation   . Bipolar disorder   . Renal disorder     Past Surgical History  Procedure Laterality Date  . Renal stents    . Tonsillectomy    . Mandible surgery      History   Social History  . Marital Status: Single    Spouse Name: N/A  . Number of Children: N/A  . Years of Education: N/A   Social History Main Topics  . Smoking status: Never Smoker   . Smokeless tobacco: Not on file  . Alcohol Use: No     Comment: before preg  . Drug Use: No  . Sexual Activity: No   Other Topics Concern  . None   Social History Narrative    Family History  Problem Relation Age of Onset  . Diabetes Mother   . Diabetes Father   . Hypertension Father    In addition, the patient has no family history of mental retardation, birth defects, or genetic diseases.  Physical Examination Filed Vitals:   08/02/14 0900  BP:  131/94  Pulse: 66  Temp:   Resp: 16   General appearance - alert, well appearing, and in no distress Abdomen - soft, nontender, nondistended, no masses or organomegaly Extremities - no pedal edema noted  Assessment and Recommendations 1.  Preeclampsia.  Ms. Sharon Townsend has both hypertension and an abnormal protein/creatinine ratio, meeting criteria for preeclampsia.  She has a persistent headache as well as blurry vision which represent severe features.  Thus, at this gestational age the most appropriate course of action is delivery.  She should continue magnesium seizure prophylaxis until 24 hours after delivery.  As Ms. Sharon Townsend fetus is cephalic it is most reasonable to proceed with an induction of labor.  Dr. Gaynell Townsend was informed and will begin induction right away.  [facility].  Please do not hesitate to contact us with questions.   Sharon Townsend,Sharon Derryberry, MD

## 2014-08-02 NOTE — Progress Notes (Signed)
Patient ID: Cristy FriedlanderBrittany R Collazos, female   DOB: 10/12/1990, 24 y.o.   MRN: [id number] mfm says deliver pt cvx 1 cm vx-3 station start pitoctn 2 and 2

## 2014-08-03 ENCOUNTER — Inpatient Hospital Stay (HOSPITAL_COMMUNITY): Payer: Medicaid Other | Admitting: Anesthesiology

## 2014-08-03 ENCOUNTER — Encounter (HOSPITAL_COMMUNITY): Payer: Self-pay | Admitting: Anesthesiology

## 2014-08-03 LAB — CBC
HCT: 34.8 % — ABNORMAL LOW (ref 36.0–46.0)
HEMATOCRIT: 34.8 % — AB (ref 36.0–46.0)
HEMOGLOBIN: 12 g/dL (ref 12.0–15.0)
Hemoglobin: 11.7 g/dL — ABNORMAL LOW (ref 12.0–15.0)
MCH: 28.7 pg (ref 26.0–34.0)
MCH: 29.2 pg (ref 26.0–34.0)
MCHC: 33.6 g/dL (ref 30.0–36.0)
MCHC: 34.5 g/dL (ref 30.0–36.0)
MCV: 84.7 fL (ref 78.0–100.0)
MCV: 85.3 fL (ref 78.0–100.0)
PLATELETS: 143 10*3/uL — AB (ref 150–400)
Platelets: 148 10*3/uL — ABNORMAL LOW (ref 150–400)
RBC: 4.08 MIL/uL (ref 3.87–5.11)
RBC: 4.11 MIL/uL (ref 3.87–5.11)
RDW: 13.6 % (ref 11.5–15.5)
RDW: 13.6 % (ref 11.5–15.5)
WBC: 13 10*3/uL — ABNORMAL HIGH (ref 4.0–10.5)
WBC: 16.3 10*3/uL — ABNORMAL HIGH (ref 4.0–10.5)

## 2014-08-03 LAB — MRSA PCR SCREENING: MRSA by PCR: NEGATIVE

## 2014-08-03 MED ORDER — BENZOCAINE-MENTHOL 20-0.5 % EX AERO: 1.0000 "application " | INHALATION_SPRAY | CUTANEOUS | Status: DC | PRN

## 2014-08-03 MED ORDER — OXYCODONE-ACETAMINOPHEN 5-325 MG PO TABS
2.0000 | ORAL_TABLET | ORAL | Status: DC | PRN
  Administered 2014-08-05: 2 via ORAL
  Filled 2014-08-03: qty 2

## 2014-08-03 MED ORDER — FERROUS SULFATE 325 (65 FE) MG PO TABS
325.0000 mg | ORAL_TABLET | Freq: Two times a day (BID) | ORAL | Status: DC
  Administered 2014-08-03 – 2014-08-05 (×5): 325 mg via ORAL
  Filled 2014-08-03 (×5): qty 1

## 2014-08-03 MED ORDER — SENNOSIDES-DOCUSATE SODIUM 8.6-50 MG PO TABS
2.0000 | ORAL_TABLET | ORAL | Status: DC
  Administered 2014-08-03 – 2014-08-04 (×2): 2 via ORAL
  Filled 2014-08-03 (×2): qty 2

## 2014-08-03 MED ORDER — OXYCODONE-ACETAMINOPHEN 5-325 MG PO TABS
1.0000 | ORAL_TABLET | ORAL | Status: DC | PRN
  Administered 2014-08-03 (×2): 1 via ORAL
  Filled 2014-08-03 (×2): qty 1

## 2014-08-03 MED ORDER — ONDANSETRON HCL 4 MG PO TABS: 4.0000 mg | ORAL_TABLET | ORAL | Status: DC | PRN

## 2014-08-03 MED ORDER — ACETAMINOPHEN 325 MG PO TABS: 650.0000 mg | ORAL_TABLET | ORAL | Status: DC | PRN

## 2014-08-03 MED ORDER — IBUPROFEN 600 MG PO TABS
600.0000 mg | ORAL_TABLET | Freq: Four times a day (QID) | ORAL | Status: DC
  Administered 2014-08-03 – 2014-08-05 (×10): 600 mg via ORAL
  Filled 2014-08-03 (×10): qty 1

## 2014-08-03 MED ORDER — PRENATAL MULTIVITAMIN CH
1.0000 | ORAL_TABLET | Freq: Every day | ORAL | Status: DC
  Administered 2014-08-03 – 2014-08-05 (×3): 1 via ORAL
  Filled 2014-08-03 (×3): qty 1

## 2014-08-03 MED ORDER — EPHEDRINE 5 MG/ML INJ
10.0000 mg | INTRAVENOUS | Status: DC | PRN
  Filled 2014-08-03: qty 2

## 2014-08-03 MED ORDER — LIDOCAINE HCL (PF) 1 % IJ SOLN
INTRAMUSCULAR | Status: DC | PRN
  Administered 2014-08-03 (×2): 8 mL

## 2014-08-03 MED ORDER — SIMETHICONE 80 MG PO CHEW: 80.0000 mg | CHEWABLE_TABLET | ORAL | Status: DC | PRN

## 2014-08-03 MED ORDER — WITCH HAZEL-GLYCERIN EX PADS: 1.0000 "application " | MEDICATED_PAD | CUTANEOUS | Status: DC | PRN

## 2014-08-03 MED ORDER — LANOLIN HYDROUS EX OINT: TOPICAL_OINTMENT | CUTANEOUS | Status: DC | PRN

## 2014-08-03 MED ORDER — FENTANYL 2.5 MCG/ML BUPIVACAINE 1/10 % EPIDURAL INFUSION (WH - ANES): 14.0000 mL/h | INTRAMUSCULAR | Status: DC | PRN

## 2014-08-03 MED ORDER — ONDANSETRON HCL 4 MG/2ML IJ SOLN: 4.0000 mg | INTRAMUSCULAR | Status: DC | PRN

## 2014-08-03 MED ORDER — TETANUS-DIPHTH-ACELL PERTUSSIS 5-2.5-18.5 LF-MCG/0.5 IM SUSP
0.5000 mL | Freq: Once | INTRAMUSCULAR | Status: AC
  Administered 2014-08-04: 0.5 mL via INTRAMUSCULAR
  Filled 2014-08-03: qty 0.5

## 2014-08-03 MED ORDER — DIPHENHYDRAMINE HCL 25 MG PO CAPS: 25.0000 mg | ORAL_CAPSULE | Freq: Four times a day (QID) | ORAL | Status: DC | PRN

## 2014-08-03 MED ORDER — ZOLPIDEM TARTRATE 5 MG PO TABS: 5.0000 mg | ORAL_TABLET | Freq: Every evening | ORAL | Status: DC | PRN

## 2014-08-03 MED ORDER — LACTATED RINGERS IV SOLN
INTRAVENOUS | Status: DC
  Administered 2014-08-03 – 2014-08-04 (×2): via INTRAVENOUS

## 2014-08-03 MED ORDER — PHENYLEPHRINE 40 MCG/ML (10ML) SYRINGE FOR IV PUSH (FOR BLOOD PRESSURE SUPPORT)
80.0000 ug | PREFILLED_SYRINGE | INTRAVENOUS | Status: DC | PRN
  Filled 2014-08-03: qty 2

## 2014-08-03 MED ORDER — DIPHENHYDRAMINE HCL 50 MG/ML IJ SOLN: 12.5000 mg | INTRAMUSCULAR | Status: DC | PRN

## 2014-08-03 MED ORDER — DIBUCAINE 1 % RE OINT: 1.0000 "application " | TOPICAL_OINTMENT | RECTAL | Status: DC | PRN

## 2014-08-03 NOTE — Anesthesia Postprocedure Evaluation (Signed)
  Anesthesia Post-op Note  Patient: [name]  Procedure(s) Performed: * No procedures listed *  Patient Location: Mother/Baby  Anesthesia Type:Epidural  Level of Consciousness: awake and alert   Airway and Oxygen Therapy: Patient Spontanous Breathing  Post-op Pain: mild  Post-op Assessment: Post-op Vital signs reviewed, Patient's Cardiovascular Status Stable, Respiratory Function Stable, No signs of Nausea or vomiting, Pain level controlled, No headache, No residual numbness and No residual motor weakness  Post-op Vital Signs: Reviewed  Last Vitals:  Filed Vitals:   08/03/14 1309  BP:   Pulse: 100  Temp:   Resp:     Complications: No apparent anesthesia complications

## 2014-08-03 NOTE — Lactation Note (Signed)
This note was copied from the chart of Boy South AfricaBrittany Scantling. Lactation Consultation Note  Initial visit done with mom in AICU.  Breastfeeding consultation services and support information given to patient.  Baby is 8 hours old and 35.[redacted] weeks gestation.  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  RN has given mom late preterm information and initiated pumping with DEBP.  Reviewed late preterm feeding behavior and recommended formula supplementation per volume guidelines every 3 hours.  Mom is very sleepy and teaching will need reinforced.  I fed the baby 6 mls of alimentum with a slow flow nipple and he did very well.  Baby has a good seal on nipple.  Instructed mom and family members to breastfeed with feeding cues, pump and hand express every 3 hours x 15 minutes and supplement with 5-10 mls of alimentum/EBM with slow flow nipple every 3 hours.  Discussed the need to increase supplementation amounts daily per LPT handout guidelines.  Encouraged to call with concerns/assist prn.  Patient Name: Boy Toya SmothersBrittany Searles YQMVH'QToday's Date: 08/03/2014 Reason for consult: Initial assessment   Maternal Data    Feeding Feeding Type: Formula Nipple Type: Slow - flow Length of feed: 15 min  LATCH Score/Interventions                      Lactation Tools Discussed/Used     Consult Status      Huston FoleyMOULDEN, Aubrianna Orchard S 08/03/2014, 3:57 PM

## 2014-08-03 NOTE — Anesthesia Preprocedure Evaluation (Signed)
Anesthesia Evaluation  Patient identified by MRN, date of birth, ID band Patient awake    Reviewed: Allergy & Precautions, H&P , NPO status , Patient's Chart, lab work & pertinent test results  Airway Mallampati: II  TM Distance: >3 FB Neck ROM: full    Dental no notable dental hx.    Pulmonary neg pulmonary ROS,    Pulmonary exam normal       Cardiovascular negative cardio ROS Normal cardiovascular exam    Neuro/Psych negative neurological ROS     GI/Hepatic negative GI ROS, Neg liver ROS,   Endo/Other  negative endocrine ROS  Renal/GU      Musculoskeletal   Abdominal (+) + obese,   Peds  Hematology negative hematology ROS (+)   Anesthesia Other Findings   Reproductive/Obstetrics (+) Pregnancy                             Anesthesia Physical Anesthesia Plan  ASA: II  Anesthesia Plan: Epidural   Post-op Pain Management:    Induction:   Airway Management Planned:   Additional Equipment:   Intra-op Plan:   Post-operative Plan:   Informed Consent: I have reviewed the patients History and Physical, chart, labs and discussed the procedure including the risks, benefits and alternatives for the proposed anesthesia with the patient or authorized representative who has indicated his/her understanding and acceptance.     Plan Discussed with:   Anesthesia Plan Comments:         Anesthesia Quick Evaluation

## 2014-08-03 NOTE — Progress Notes (Signed)
UR chart review completed.  

## 2014-08-03 NOTE — Progress Notes (Signed)
Attempted visit two times for emotional support as they process their baby having possible cleft lip, but patient was sleeping.    Please page as needs arise.  [address]Chaplain [place] Pager, [phone] 3:03 PM    08/03/14 1500  Clinical Encounter Type  Visited With Patient not available;Health care provider

## 2014-08-03 NOTE — Anesthesia Procedure Notes (Signed)
Epidural Patient location during procedure: OB Start time: 08/03/2014 2:14 AM End time: 08/03/2014 2:22 AM  Staffing Anesthesiologist: Leilani AbleHATCHETT, Rease Swinson Performed by: anesthesiologist   Preanesthetic Checklist Completed: patient identified, surgical consent, pre-op evaluation, timeout performed, IV checked, risks and benefits discussed and monitors and equipment checked  Epidural Patient position: sitting Prep: site prepped and draped and DuraPrep Patient monitoring: continuous pulse ox and blood pressure Approach: midline Location: L3-L4 Injection technique: LOR air  Needle:  Needle type: Tuohy  Needle gauge: 17 G Needle length: 9 cm and 9 Needle insertion depth: 5 cm cm Catheter type: closed end flexible Catheter size: 19 Gauge Catheter at skin depth: 10 cm Test dose: negative and Other  Assessment Sensory level: T10 Events: blood not aspirated, injection not painful, no injection resistance, negative IV test and no paresthesia  Additional Notes Reason for block:procedure for pain

## 2014-08-04 LAB — CBC
HCT: 32.5 % — ABNORMAL LOW (ref 36.0–46.0)
HEMOGLOBIN: 10.9 g/dL — AB (ref 12.0–15.0)
MCH: 28.8 pg (ref 26.0–34.0)
MCHC: 33.5 g/dL (ref 30.0–36.0)
MCV: 86 fL (ref 78.0–100.0)
Platelets: 140 10*3/uL — ABNORMAL LOW (ref 150–400)
RBC: 3.78 MIL/uL — ABNORMAL LOW (ref 3.87–5.11)
RDW: 14.1 % (ref 11.5–15.5)
WBC: 12.6 10*3/uL — ABNORMAL HIGH (ref 4.0–10.5)

## 2014-08-04 LAB — SYPHILIS: RPR W/REFLEX TO RPR TITER AND TREPONEMAL ANTIBODIES, TRADITIONAL SCREENING AND DIAGNOSIS ALGORITHM: RPR Ser Ql: NONREACTIVE

## 2014-08-04 NOTE — Progress Notes (Signed)
Post Partum Day 1 Subjective: no complaints  Objective: Blood pressure 125/81, pulse 98, temperature 98.4 F (36.9 C), temperature source Oral, resp. rate 18, height 5\' 1"  (1.549 m), weight 168 lb 14.4 oz (76.613 kg), last menstrual period 11/27/2013, SpO2 100 %, unknown if currently breastfeeding.  Physical Exam:  General: alert and no distress Lochia: appropriate Uterine Fundus: firm Incision: none DVT Evaluation: No evidence of DVT seen on physical exam.   Recent Labs  08/03/14 0740 08/04/14 0657  HGB 11.7* 10.9*  HCT 34.8* 32.5*    Assessment/Plan: PIH.  Doing well.  D/C magnesium sulfate and transfer to floor. Plan for discharge tomorrow   LOS: 2 days   HARPER,CHARLES A 08/04/2014, 9:54 AM

## 2014-08-04 NOTE — Lactation Note (Signed)
This note was copied from the chart of Boy South AfricaBrittany Burdi. Lactation Consultation Note  Patient Name: Boy Toya SmothersBrittany Wiatrek ZOXWR'UToday's Date: 08/04/2014 Reason for consult: Follow-up assessment;Infant < 6lbs;Difficult latch Mom reports baby is taking bottle well. He is starting to latch to the right breast but has difficulty with the left breast with latch/flow. Mom is pumping every 3 hours and getting few drops from left breast per her report. Mom had questions about pump for home use, has WIC. Explained loaner program, left WIC BF hotline number for Mom to call of Monday AM, [facility]. Per hours of age advised Mom to be sure baby gets minimum of 10 ml of EBM/formula increasing to 15-20 as tolerated. Call for assist as needed.   Maternal Data    Feeding    LATCH Score/Interventions                      Lactation Tools Discussed/Used WIC Program: Yes   Consult Status Consult Status: Follow-up Date: 08/05/14 Follow-up type: In-patient    Alfred LevinsGranger, Wallie Lagrand Ann 08/04/2014, 11:14 PM

## 2014-08-05 ENCOUNTER — Ambulatory Visit: Payer: Self-pay

## 2014-08-05 MED ORDER — IBUPROFEN 600 MG PO TABS: 600.0000 mg | ORAL_TABLET | Freq: Four times a day (QID) | ORAL | Status: DC | PRN

## 2014-08-05 MED ORDER — OXYCODONE-ACETAMINOPHEN 5-325 MG PO TABS: 1.0000 | ORAL_TABLET | ORAL | Status: DC | PRN

## 2014-08-05 MED ORDER — MEASLES, MUMPS & RUBELLA VAC ~~LOC~~ INJ
0.5000 mL | INJECTION | Freq: Once | SUBCUTANEOUS | Status: AC
  Administered 2014-08-05: 0.5 mL via SUBCUTANEOUS
  Filled 2014-08-05: qty 0.5

## 2014-08-05 MED ORDER — FUSION PLUS PO CAPS: 1.0000 | ORAL_CAPSULE | Freq: Every day | ORAL | Status: DC

## 2014-08-05 NOTE — Progress Notes (Signed)
CLINICAL SOCIAL WORK MATERNAL/CHILD NOTE  Patient Details  Name: Sharon Townsend MRN: [id number] Date of Birth: 08/03/2014  Date: 08/05/2014  Clinical Social Worker Initiating Note: Yarelie Hams, LCSWDate/ Time Initiated: 08/05/14/1000   Child's Name: Sharon Townsend   Legal Guardian: Mother   Need for Interpreter: None   Date of Referral: 08/03/14   Reason for Referral:  (hx of mental illness)   Referral Source: [facility]   Address: [address] [place], [place] [address]  Phone number:     Household Members:  (Resides with maternal grandparents)   Natural Supports (not living in the home): Immediate Family, Extended Family   Professional Supports:Other (Comment) ([facility])   Employment:Unemployed   Type of Work:     Education:     Museum/gallery curator Resources:Medicaid   Other Resources: [facility]   Cultural/Religious Considerations Which May Impact Care: none reported  Strengths: Ability to meet basic needs , Home prepared for child    Risk Factors/Current Problems:  (Mother has hx of bipolar)   Cognitive State: Alert , Able to Concentrate    Mood/Affect: Happy , Relaxed , Calm    CSW Assessment: Acknowledged order for social work consult to assess mother's hx of bipolar. Met with mother who was pleasant and receptive to social work. She is a single parent with no other dependents. Mother states that FOB is uninvolved, and she expects no voluntary support from him. She resides with both her parents and they are her primary support. Mother reports that she was diagnosed with bipolar, and was taking medication prior to becoming pregnant. Informed that her mood has been stable throughout the pregnancy and she reports no current symptoms of depression, anxiety or mood elevation. Informed that she was hospitalized in a psychiatric facility once a couple of years ago. She denies any substance abuse  hx. No acute social concerns noted or reported or noted at this time. Mother informed of social work Fish farm manager.  CSW Plan/Description:    Patient/Family Education - PP Depression information and resources No further intervention required No barriers to discharge  Kiaya Haliburton J, LCSW 08/05/2014, 3:27 PM

## 2014-08-05 NOTE — Progress Notes (Signed)
Post Partum Day 2 Subjective: no complaints  Objective: Blood pressure 122/95, pulse 82, temperature 97.9 F (36.6 C), temperature source Oral, resp. rate 20, height 5\' 1"  (1.549 m), weight 168 lb 14.4 oz (76.613 kg), last menstrual period 11/27/2013, SpO2 100 %, unknown if currently breastfeeding.  Physical Exam:  General: alert and no distress Lochia: appropriate Uterine Fundus: firm Incision: healing well DVT Evaluation: No evidence of DVT seen on physical exam.   Recent Labs  08/03/14 0740 08/04/14 0657  HGB 11.7* 10.9*  HCT 34.8* 32.5*    Assessment/Plan: PIH.  Stable Discharge home   LOS: 3 days   HARPER,CHARLES A 08/05/2014, 8:10 AM

## 2014-08-05 NOTE — Discharge Summary (Signed)
Obstetric Discharge Summary Reason for Admission: Hypertension Prenatal Procedures: NST and ultrasound Intrapartum Procedures: spontaneous vaginal delivery Postpartum Procedures: magnesium sulfate Complications-Operative and Postpartum: none HEMOGLOBIN  Date Value Ref Range Status  08/04/2014 10.9* 12.0 - 15.0 g/dL Final   HCT  Date Value Ref Range Status  08/04/2014 32.5* 36.0 - 46.0 % Final    Physical Exam:  General: alert and no distress Lochia: appropriate Uterine Fundus: firm Incision: none DVT Evaluation: No evidence of DVT seen on physical exam.  Discharge Diagnoses: Preelampsia  Discharge Information: Date: 08/05/2014 Activity: pelvic rest Diet: routine Medications: PNV, Ibuprofen, Colace, Iron and Percocet Condition: improved Instructions: refer to practice specific booklet Discharge to: home Follow-up Information    Follow up with MARSHALL,BERNARD A, MD In 2 weeks.   Specialty:  Obstetrics and Gynecology   Contact information:   [address] [place] [address] [phone]       Newborn Data: Live born female  Birth Weight: 4 lb 7.1 oz (2016 g) APGAR: 7, 9  Home with mother.  HARPER,CHARLES A 08/05/2014, 8:17 AM

## 2014-08-05 NOTE — Lactation Note (Signed)
This note was copied from the chart of Sharon South AfricaBrittany Dyke. Lactation Consultation Note  Patient Name: Sharon Toya SmothersBrittany Yeagle ZOXWR'UToday's Date: 08/05/2014 Reason for consult: Follow-up assessment;Difficult latch;Infant < 6lbs;Late preterm infant Mom reports baby will latch to right breast and she is trying to get baby latched to left breast. At this visit, Mom's nipples are flat, pre-pumped but nipple has short shaft and flattens w/breast compression. Tissue is thick at base of nipple. Mom attempted to latch baby to left breast then right breast but baby cannot obtain any depth. Mom reports baby just falls asleep at the breast. LC advised this is because baby is not latching at this time. Mom's nipple and baby's mouth are fitting together well. LC encouraged Mom to continue to pump/bottle feed. Advised she needs to increase supplement to at least 20 ml each feeding. Mom reports baby usually takes 10 ml then takes 5-10 minutes break then will finish the feeding. Advised to pump every 3 hours for 15 minutes. Mom reports obtaining few drops from left breast so she finger feeds this back to baby. Advised Mom if she wants baby to nuzzle at the breast this would be great but not to use a lot of baby's energy on latching at this point. Advised Mom she can see us to help her with latch as OP if she desires once baby is bigger, her milk is in. Mom to call WIC in am about DEBP for home. [facility]   Maternal Data    Feeding Feeding Type: Breast Fed Length of feed: 10 min  LATCH Score/Interventions Latch: Too sleepy or reluctant, no latch achieved, no sucking elicited. Intervention(s): Adjust position;Assist with latch;Breast massage;Breast compression  Audible Swallowing: None  Type of Nipple: Flat  Comfort (Breast/Nipple): Soft / non-tender     Hold (Positioning): Assistance needed to correctly position infant at breast and maintain latch. Intervention(s): Breastfeeding basics reviewed;Support  Pillows;Position options;Skin to skin  LATCH Score: 4  Lactation Tools Discussed/Used     Consult Status Consult Status: Follow-up Date: 08/06/14 Follow-up type: In-patient    Alfred LevinsGranger, Zephyr Ridley Ann 08/05/2014, 7:20 PM

## 2014-08-06 ENCOUNTER — Ambulatory Visit: Payer: Self-pay

## 2014-08-06 NOTE — Lactation Note (Addendum)
This note was copied from the chart of Sharon South AfricaBrittany Emerick. Lactation Consultation Note: Mother offered assistance with breastfeeding infant. Mother states that she is pumping and bottle feeding. She states that she has her own DEBP, that is her with her. Advised to take home all electric parts as they will work with her pump. Reviewed milk collection, storage from Baby and me book. Discussed treatment to prevent engorgement. Mother states she pumped 5 ml from each breast with last pumping. She was advised to continue to pump every 2-3 hours for 15-20 mins. Mother receptive to all teaching.   Patient Name: Sharon Townsend UJWJX'BToday's Date: 08/06/2014 Reason for consult: Follow-up assessment   Maternal Data    Feeding Feeding Type: Formula Nipple Type: Slow - flow  LATCH Score/Interventions                      Lactation Tools Discussed/Used     Consult Status Consult Status: Follow-up Date: 08/06/14 Follow-up type: In-patient    Stevan BornKendrick, Kodey Xue [facility] 08/06/2014, 10:52 AM

## 2014-08-23 ENCOUNTER — Encounter (HOSPITAL_COMMUNITY): Payer: Self-pay | Admitting: Emergency Medicine

## 2014-08-23 ENCOUNTER — Emergency Department (HOSPITAL_COMMUNITY): Payer: Medicaid Other

## 2014-08-23 ENCOUNTER — Emergency Department (HOSPITAL_COMMUNITY)
Admission: EM | Admit: 2014-08-23 | Discharge: 2014-08-24 | Disposition: A | Discharge status: Home / Self Care | Payer: Medicaid Other | Attending: Emergency Medicine | Admitting: Emergency Medicine

## 2014-08-23 DIAGNOSIS — Z79899 Other long term (current) drug therapy: Secondary | ICD-10-CM | POA: Insufficient documentation

## 2014-08-23 DIAGNOSIS — Z8659 Personal history of other mental and behavioral disorders: Secondary | ICD-10-CM | POA: Insufficient documentation

## 2014-08-23 DIAGNOSIS — R079 Chest pain, unspecified: Secondary | ICD-10-CM

## 2014-08-23 DIAGNOSIS — Z87448 Personal history of other diseases of urinary system: Secondary | ICD-10-CM | POA: Insufficient documentation

## 2014-08-23 DIAGNOSIS — K8020 Calculus of gallbladder without cholecystitis without obstruction: Secondary | ICD-10-CM | POA: Diagnosis not present

## 2014-08-23 DIAGNOSIS — Z91040 Latex allergy status: Secondary | ICD-10-CM | POA: Insufficient documentation

## 2014-08-23 DIAGNOSIS — R1011 Right upper quadrant pain: Secondary | ICD-10-CM

## 2014-08-23 DIAGNOSIS — Z880 Allergy status to penicillin: Secondary | ICD-10-CM | POA: Diagnosis not present

## 2014-08-23 MED ORDER — SODIUM CHLORIDE 0.9 % IV BOLUS (SEPSIS)
1000.0000 mL | Freq: Once | INTRAVENOUS | Status: AC
  Administered 2014-08-23: 1000 mL via INTRAVENOUS

## 2014-08-23 MED ORDER — KETOROLAC TROMETHAMINE 30 MG/ML IJ SOLN
30.0000 mg | Freq: Once | INTRAMUSCULAR | Status: AC
  Administered 2014-08-23: 30 mg via INTRAVENOUS
  Filled 2014-08-23: qty 1

## 2014-08-23 MED ORDER — IOHEXOL 350 MG/ML SOLN
100.0000 mL | Freq: Once | INTRAVENOUS | Status: AC | PRN
  Administered 2014-08-23: 100 mL via INTRAVENOUS

## 2014-08-23 NOTE — ED Notes (Signed)
Per EMS: Pt from home.  C/o chest wall pain to rt side x 2 hrs.  Denies NVD.

## 2014-08-23 NOTE — ED Provider Notes (Signed)
TIME SEEN: 10:40 PM  CHIEF COMPLAINT: Right-sided chest pain, abdominal pain  HPI: Pt is a 24 y.o. email with history of bipolar disorder who presents to the emergency department with right-sided chest pain and upper abdominal pain. Reports pain started as a cramping pain in her right upper quadrant around 4 PM after eating spaghetti and is now a sharp pain in her chest that is worse with breathing. No relieving factors. She states she does feel short of breath. No nausea, vomiting or diarrhea. No fever or cough. No bloody stool or melanoma. Has never had similar symptoms. Denies history of PE or DVT. No lower extremity swelling or pain. No history of abdominal surgery. Patient had a normal spontaneous vaginal delivery 3 weeks ago. Reports her lochia has resolved. No dysuria, hematuria. No vaginal bleeding or discharge.  ROS: See HPI Constitutional: no fever  Eyes: no drainage  ENT: no runny nose   Cardiovascular:   chest pain  Resp:  SOB  GI: no vomiting GU: no dysuria Integumentary: no rash  Allergy: no hives  Musculoskeletal: no leg swelling  Neurological: no slurred speech ROS otherwise negative  PAST MEDICAL HISTORY/PAST SURGICAL HISTORY:  Past Medical History  Diagnosis Date  . Chronic constipation   . Bipolar disorder   . Renal disorder     MEDICATIONS:  Prior to Admission medications   Medication Sig Start Date End Date Taking? Authorizing Provider  ibuprofen (ADVIL,MOTRIN) 600 MG tablet Take 1 tablet (600 mg total) by mouth every 6 (six) hours as needed for mild pain. 08/05/14  Yes Brock Badharles A Harper, MD  Iron-FA-B Cmp-C-Biot-Probiotic (FUSION PLUS) CAPS Take 1 capsule by mouth daily before breakfast. 08/05/14  Yes Brock Badharles A Harper, MD  Prenat w/o A Vit-FeFum-FePo-FA (CONCEPT OB) 130-92.4-1 MG CAPS Take 1 tablet by mouth daily. 12/26/13  Yes Dorathy KinsmanVirginia Smith, CNM  ondansetron (ZOFRAN) 4 MG tablet Take 1 tablet (4 mg total) by mouth every 6 (six) hours. Patient not taking: Reported  on 08/23/2014 07/22/14   Marny LowensteinJulie N Wenzel, PA-C  oxyCODONE-acetaminophen (PERCOCET/ROXICET) 5-325 MG per tablet Take 1-2 tablets by mouth every 4 (four) hours as needed (for pain scale greater than 7). Patient not taking: Reported on 08/23/2014 08/05/14   Brock Badharles A Harper, MD    ALLERGIES:  Allergies  Allergen Reactions  . Peanuts [Peanut Oil] Anaphylaxis  . Latex Swelling  . Penicillins Other (See Comments)    Reaction:  Pt states that it "shuts down her bowel movements".    SOCIAL HISTORY:  History  Substance Use Topics  . Smoking status: Never Smoker   . Smokeless tobacco: Not on file  . Alcohol Use: No     Comment: before preg    FAMILY HISTORY: Family History  Problem Relation Age of Onset  . Diabetes Mother   . Diabetes Father   . Hypertension Father     EXAM: BP 115/75 mmHg  Pulse 78  Temp(Src) 98.5 F (36.9 C)  Resp 18  SpO2 100%  LMP 11/27/2013 CONSTITUTIONAL: Alert and oriented and responds appropriately to questions. Well-appearing; well-nourished, nontoxic but appears uncomfortable HEAD: Normocephalic EYES: Conjunctivae clear, PERRL ENT: normal nose; no rhinorrhea; moist mucous membranes; pharynx without lesions noted NECK: Supple, no meningismus, no LAD  CARD: RRR; S1 and S2 appreciated; no murmurs, no clicks, no rubs, no gallops RESP: Normal chest excursion without splinting or tachypnea; breath sounds clear and equal bilaterally; no wheezes, no rhonchi, no rales, no hypoxia or respiratory distress, speaking full sentences, tender to palpation over  the right lower anterior chest wall without crepitus or ecchymosis or deformity ABD/GI: Normal bowel sounds; non-distended; soft, tender to palpation in the right upper quadrant with positive Piano sign, no rebound, no peritoneal signs BACK:  The back appears normal and is non-tender to palpation, there is no CVA tenderness EXT: Normal ROM in all joints; non-tender to palpation; no edema; normal capillary refill; no  cyanosis, no calf tenderness or swelling    SKIN: Normal color for age and race; warm NEURO: Moves all extremities equally, sensation to light touch intact diffusely, cranial nerves II through XII intact PSYCH: The patient's mood and manner are appropriate. Grooming and personal hygiene are appropriate.  MEDICAL DECISION MAKING: Patient here with right upper quadrant abdominal pain worse after eating as well as right-sided chest pain worse with deep inspiration. Recently had a spontaneous vaginal delivery and is at risk for pulmonary embolus. Will obtain CTA chest given my clinical suspicion is high. Also obtain a right upper quadrant ultrasound for possible gallstones given there is some component of pain worse after eating and she does have abdominal pain on exam with positive Waltner sign. We'll give Toradol for pain as she is currently breast-feeding. EKG shows no ischemic changes but she does have borderline right axis deviation with no old for comparison.  ED PROGRESS: CT chest is unremarkable other than mild cardiomegaly with no pulmonary edema, embolus, infiltrate. She does have gallstones without signs of cholecystitis. LFTs, lipase normal. She does have a mild leukocytosis. Pain has improved with Toradol. She declines any further pain medication as she wants to continue breast-feeding. We'll discharge her with prescriptions for ibuprofen as well as Vicodin but have advised her to avoid Vicodin if she is going to breast-feed. Given surgery outpatient follow-up information. Discussed changes in diet. Discussed return precautions. She verbalizes understanding is comfortable with plan.     EKG Interpretation  Date/Time:  Thursday Aug 23 2014 19:42:18 EDT Ventricular Rate:  72 PR Interval:  147 QRS Duration: 75 QT Interval:  384 QTC Calculation: 420 R Axis:   90 Text Interpretation:  Sinus rhythm Borderline right axis deviation No old tracing to compare Confirmed by KNAPP  MD-J, JON ([id number])  on 08/23/2014 8:30:51 PM         Layla Maw Ward, DO 08/24/14 [id number]

## 2014-08-24 LAB — COMPREHENSIVE METABOLIC PANEL
ALBUMIN: 4.2 g/dL (ref 3.5–5.0)
ALT: 19 U/L (ref 14–54)
AST: 37 U/L (ref 15–41)
Alkaline Phosphatase: 105 U/L (ref 38–126)
Anion gap: 10 (ref 5–15)
BUN: 20 mg/dL (ref 6–20)
CO2: 24 mmol/L (ref 22–32)
CREATININE: 0.83 mg/dL (ref 0.44–1.00)
Calcium: 9.5 mg/dL (ref 8.9–10.3)
Chloride: 103 mmol/L (ref 101–111)
GFR calc Af Amer: >60 mL/min (ref >60)
GFR calc non Af Amer: >60 mL/min (ref >60)
GLUCOSE: 114 mg/dL — AB (ref 65–99)
Potassium: 4.7 mmol/L (ref 3.5–5.1)
Sodium: 137 mmol/L (ref 135–145)
TOTAL PROTEIN: 7.4 g/dL (ref 6.5–8.1)
Total Bilirubin: 0.7 mg/dL (ref 0.3–1.2)

## 2014-08-24 LAB — CBC WITH DIFFERENTIAL/PLATELET
Basophils Absolute: 0 10*3/uL (ref 0.0–0.1)
Basophils Relative: 0 % (ref 0–1)
Eosinophils Absolute: 0 10*3/uL (ref 0.0–0.7)
Eosinophils Relative: 0 % (ref 0–5)
HCT: 40.1 % (ref 36.0–46.0)
Hemoglobin: 12.9 g/dL (ref 12.0–15.0)
LYMPHS PCT: 11 % — AB (ref 12–46)
Lymphs Abs: 1.2 10*3/uL (ref 0.7–4.0)
MCH: 28.4 pg (ref 26.0–34.0)
MCHC: 32.2 g/dL (ref 30.0–36.0)
MCV: 88.3 fL (ref 78.0–100.0)
MONO ABS: 0.6 10*3/uL (ref 0.1–1.0)
Monocytes Relative: 5 % (ref 3–12)
NEUTROS ABS: 9.6 10*3/uL — AB (ref 1.7–7.7)
NEUTROS PCT: 84 % — AB (ref 43–77)
Platelets: 282 10*3/uL (ref 150–400)
RBC: 4.54 MIL/uL (ref 3.87–5.11)
RDW: 13 % (ref 11.5–15.5)
WBC: 11.4 10*3/uL — ABNORMAL HIGH (ref 4.0–10.5)

## 2014-08-24 LAB — LIPASE, BLOOD: Lipase: 30 U/L (ref 22–51)

## 2014-08-24 MED ORDER — ONDANSETRON 4 MG PO TBDP: 4.0000 mg | ORAL_TABLET | Freq: Three times a day (TID) | ORAL | Status: DC | PRN

## 2014-08-24 MED ORDER — IBUPROFEN 800 MG PO TABS: 800.0000 mg | ORAL_TABLET | Freq: Three times a day (TID) | ORAL | Status: DC | PRN

## 2014-08-24 MED ORDER — HYDROCODONE-ACETAMINOPHEN 5-325 MG PO TABS: 1.0000 | ORAL_TABLET | ORAL | Status: DC | PRN

## 2014-08-24 NOTE — Discharge Instructions (Signed)
Cholelithiasis  Cholelithiasis (also called gallstones) is a form of gallbladder disease in which gallstones form in your gallbladder. The gallbladder is an organ that stores bile made in the liver, which helps digest fats. Gallstones begin as small crystals and slowly grow into stones. Gallstone pain occurs when the gallbladder spasms and a gallstone is blocking the duct. Pain can also occur when a stone passes out of the duct.   RISK FACTORS   Being female.    Having multiple pregnancies. Health care providers sometimes advise removing diseased gallbladders before future pregnancies.    Being obese.   Eating a diet heavy in fried foods and fat.    Being older than 60 years and increasing age.    Prolonged use of medicines containing female hormones.    Having diabetes mellitus.    Rapidly losing weight.    Having a family history of gallstones (heredity).   SYMPTOMS   Nausea.    Vomiting.   Abdominal pain.    Yellowing of the skin (jaundice).    Sudden pain. It may persist from several minutes to several hours.   Fever.    Tenderness to the touch.  In some cases, when gallstones do not move into the bile duct, people have no pain or symptoms. These are called "silent" gallstones.   TREATMENT  Silent gallstones do not need treatment. In severe cases, emergency surgery may be required. Options for treatment include:   Surgery to remove the gallbladder. This is the most common treatment.   Medicines. These do not always work and may take 6-12 months or more to work.   Shock wave treatment (extracorporeal biliary lithotripsy). In this treatment an ultrasound machine sends shock waves to the gallbladder to break gallstones into smaller pieces that can pass into the intestines or be dissolved by medicine.  HOME CARE INSTRUCTIONS    Only take over-the-counter or prescription medicines for pain, discomfort, or fever as directed by your health care provider.    Follow a low-fat diet until  seen again by your health care provider. Fat causes the gallbladder to contract, which can result in pain.    Follow up with your health care provider as directed. Attacks are almost always recurrent and surgery is usually required for permanent treatment.   SEEK IMMEDIATE MEDICAL CARE IF:    Your pain increases and is not controlled by medicines.    You have a fever or persistent symptoms for more than 2-3 days.    You have a fever and your symptoms suddenly get worse.    You have persistent nausea and vomiting.   MAKE SURE YOU:    Understand these instructions.   Will watch your condition.   Will get help right away if you are not doing well or get worse.  Document Released: 03/12/2005 Document Revised: 11/16/2012 Document Reviewed: 09/07/2012  ExitCare Patient Information 2015 [employer]. This information is not intended to replace advice given to you by your health care provider. Make sure you discuss any questions you have with your health care provider.    Low-Fat Diet for Pancreatitis or Gallbladder Conditions  A low-fat diet can be helpful if you have pancreatitis or a gallbladder condition. With these conditions, your pancreas and gallbladder have trouble digesting fats. A healthy eating plan with less fat will help rest your pancreas and gallbladder and reduce your symptoms.  WHAT DO I NEED TO KNOW ABOUT THIS DIET?   Eat a low-fat diet.   Reduce your   fat intake to less than 20-30% of your total daily calories. This is less than 50-60 g of fat per day.   Remember that you need some fat in your diet. Ask your dietician what your daily goal should be.   Choose nonfat and low-fat healthy foods. Look for the words "nonfat," "low fat," or "fat free."   As a guide, look on the label and choose foods with less than 3 g of fat per serving. Eat only one serving.   Avoid alcohol.   Do not smoke. If you need help quitting, talk with your health care provider.   Eat small frequent meals  instead of three large heavy meals.  WHAT FOODS CAN I EAT?  Grains  Include healthy grains and starches such as potatoes, wheat bread, fiber-rich cereal, and brown rice. Choose whole grain options whenever possible. In adults, whole grains should account for 45-65% of your daily calories.   Fruits and Vegetables  Eat plenty of fruits and vegetables. Fresh fruits and vegetables add fiber to your diet.  Meats and Other Protein Sources  Eat lean meat such as chicken and pork. Trim any fat off of meat before cooking it. Eggs, fish, and beans are other sources of protein. In adults, these foods should account for 10-35% of your daily calories.  Dairy  Choose low-fat milk and dairy options. Dairy includes fat and protein, as well as calcium.   Fats and Oils  Limit high-fat foods such as fried foods, sweets, baked goods, sugary drinks.   Other  Creamy sauces and condiments, such as mayonnaise, can add extra fat. Think about whether or not you need to use them, or use smaller amounts or low fat options.  WHAT FOODS ARE NOT RECOMMENDED?   High fat foods, such as:   Baked goods.   Ice cream.   French toast.   Sweet rolls.   Pizza.   Cheese bread.   Foods covered with batter, butter, creamy sauces, or cheese.   Fried foods.   Sugary drinks and desserts.   Foods that cause gas or bloating  Document Released: 03/21/2013 Document Reviewed: 03/21/2013  ExitCare Patient Information 2015 [employer]. This information is not intended to replace advice given to you by your health care provider. Make sure you discuss any questions you have with your health care provider.

## 2014-08-24 NOTE — ED Notes (Signed)
Pt ambulating independently w/ steady gait on d/c in no acute distress, A&Ox4. D/c instructions reviewed w/ pt and family - pt and family deny any further questions or concerns at present. Rx given x3  

## 2014-09-03 ENCOUNTER — Encounter (HOSPITAL_COMMUNITY): Payer: Self-pay | Admitting: Emergency Medicine

## 2014-09-03 DIAGNOSIS — Z87448 Personal history of other diseases of urinary system: Secondary | ICD-10-CM | POA: Diagnosis not present

## 2014-09-03 DIAGNOSIS — Z3202 Encounter for pregnancy test, result negative: Secondary | ICD-10-CM | POA: Insufficient documentation

## 2014-09-03 DIAGNOSIS — K8050 Calculus of bile duct without cholangitis or cholecystitis without obstruction: Secondary | ICD-10-CM | POA: Diagnosis not present

## 2014-09-03 DIAGNOSIS — Z880 Allergy status to penicillin: Secondary | ICD-10-CM | POA: Diagnosis not present

## 2014-09-03 DIAGNOSIS — Z91040 Latex allergy status: Secondary | ICD-10-CM | POA: Insufficient documentation

## 2014-09-03 DIAGNOSIS — Z8659 Personal history of other mental and behavioral disorders: Secondary | ICD-10-CM | POA: Diagnosis not present

## 2014-09-03 DIAGNOSIS — R1011 Right upper quadrant pain: Secondary | ICD-10-CM | POA: Diagnosis present

## 2014-09-03 DIAGNOSIS — Z79899 Other long term (current) drug therapy: Secondary | ICD-10-CM | POA: Insufficient documentation

## 2014-09-03 LAB — POC URINE PREG, ED: Preg Test, Ur: NEGATIVE

## 2014-09-03 NOTE — ED Notes (Signed)
Pt. reports RUQ pain onset today with nausea and vomitting , diagnosed with gallstones last month advised to return to ER with recurring pain . Denies diarrhea/ no fever or chills.

## 2014-09-04 ENCOUNTER — Emergency Department (HOSPITAL_COMMUNITY): Payer: Medicaid Other

## 2014-09-04 ENCOUNTER — Emergency Department (HOSPITAL_COMMUNITY)
Admission: EM | Admit: 2014-09-04 | Discharge: 2014-09-04 | Disposition: A | Discharge status: Home / Self Care | Payer: Medicaid Other | Attending: Emergency Medicine | Admitting: Emergency Medicine

## 2014-09-04 DIAGNOSIS — K8050 Calculus of bile duct without cholangitis or cholecystitis without obstruction: Secondary | ICD-10-CM

## 2014-09-04 DIAGNOSIS — R1011 Right upper quadrant pain: Secondary | ICD-10-CM

## 2014-09-04 HISTORY — DX: Calculus of gallbladder without cholecystitis without obstruction: K80.20

## 2014-09-04 LAB — URINALYSIS, ROUTINE W REFLEX MICROSCOPIC
Bilirubin Urine: NEGATIVE
Glucose, UA: NEGATIVE mg/dL
HGB URINE DIPSTICK: NEGATIVE
Ketones, ur: NEGATIVE mg/dL
NITRITE: NEGATIVE
PH: 6 (ref 5.0–8.0)
PROTEIN: NEGATIVE mg/dL
Specific Gravity, Urine: 1.026 (ref 1.005–1.030)
UROBILINOGEN UA: 0.2 mg/dL (ref 0.0–1.0)

## 2014-09-04 LAB — CBC WITH DIFFERENTIAL/PLATELET
BASOS ABS: 0 10*3/uL (ref 0.0–0.1)
Basophils Relative: 0 % (ref 0–1)
Eosinophils Absolute: 0.2 10*3/uL (ref 0.0–0.7)
Eosinophils Relative: 2 % (ref 0–5)
HEMATOCRIT: 37.7 % (ref 36.0–46.0)
Hemoglobin: 12.5 g/dL (ref 12.0–15.0)
Lymphocytes Relative: 29 % (ref 12–46)
Lymphs Abs: 2.4 10*3/uL (ref 0.7–4.0)
MCH: 28.6 pg (ref 26.0–34.0)
MCHC: 33.2 g/dL (ref 30.0–36.0)
MCV: 86.3 fL (ref 78.0–100.0)
Monocytes Absolute: 0.5 10*3/uL (ref 0.1–1.0)
Monocytes Relative: 7 % (ref 3–12)
Neutro Abs: 5 10*3/uL (ref 1.7–7.7)
Neutrophils Relative %: 62 % (ref 43–77)
Platelets: 230 10*3/uL (ref 150–400)
RBC: 4.37 MIL/uL (ref 3.87–5.11)
RDW: 13.2 % (ref 11.5–15.5)
WBC: 8.2 10*3/uL (ref 4.0–10.5)

## 2014-09-04 LAB — COMPREHENSIVE METABOLIC PANEL
ALK PHOS: 94 U/L (ref 38–126)
ALT: 17 U/L (ref 14–54)
ANION GAP: 10 (ref 5–15)
AST: 21 U/L (ref 15–41)
Albumin: 4 g/dL (ref 3.5–5.0)
BUN: 15 mg/dL (ref 6–20)
CALCIUM: 9.1 mg/dL (ref 8.9–10.3)
CO2: 26 mmol/L (ref 22–32)
CREATININE: 1.06 mg/dL — AB (ref 0.44–1.00)
Chloride: 104 mmol/L (ref 101–111)
GFR calc Af Amer: >60 mL/min (ref >60)
GFR calc non Af Amer: >60 mL/min (ref >60)
Glucose, Bld: 87 mg/dL (ref 65–99)
Potassium: 4.2 mmol/L (ref 3.5–5.1)
Sodium: 140 mmol/L (ref 135–145)
TOTAL PROTEIN: 6.6 g/dL (ref 6.5–8.1)
Total Bilirubin: 0.4 mg/dL (ref 0.3–1.2)

## 2014-09-04 LAB — LIPASE, BLOOD: Lipase: 26 U/L (ref 22–51)

## 2014-09-04 LAB — URINE MICROSCOPIC-ADD ON

## 2014-09-04 MED ORDER — HYDROCODONE-ACETAMINOPHEN 5-325 MG PO TABS: 1.0000 | ORAL_TABLET | Freq: Two times a day (BID) | ORAL | Status: DC | PRN

## 2014-09-04 NOTE — ED Notes (Signed)
Pt taken to xray. Rad tech will take pt to room after.

## 2014-09-04 NOTE — ED Provider Notes (Signed)
CSN: [id number]     Arrival date & time 09/03/14  2246 History   This chart was scribed for Tomasita CrumbleAdeleke Aisa Schoeppner, MD by Abel PrestoKara Demonbreun, ED Scribe. This patient was seen in room B16C/B16C and the patient's care was started at 1:53 AM.    Chief Complaint  Patient presents with  . Abdominal Pain    The history is provided by the patient. No language interpreter was used.   HPI Comments: Sharon Townsend is a 24 y.o. female who presents to the Emergency Department complaining of recurrent RUQ abdominal pain with onset today. Pt ate a salad today. Pt notes associated vomiting 3-4 times. Pt was seen in ED on 08/24/14 and dx with gallstones. Pt has taken Zofran with relief. Pt has an appointment with a surgeon on 09/12/14. Pt denies fever, difficulty urinating, vaginal bleeding, and vaginal discharge.   Past Medical History  Diagnosis Date  . Chronic constipation   . Bipolar disorder   . Renal disorder   . Gall bladder stones    Past Surgical History  Procedure Laterality Date  . Renal stents    . Tonsillectomy    . Mandible surgery     Family History  Problem Relation Age of Onset  . Diabetes Mother   . Diabetes Father   . Hypertension Father    History  Substance Use Topics  . Smoking status: Never Smoker   . Smokeless tobacco: Not on file  . Alcohol Use: No     Comment: before preg   OB History    Gravida Para Term Preterm AB TAB SAB Ectopic Multiple Living   3 1  1 2  2   0 1     Review of Systems  Gastrointestinal: Positive for abdominal pain.  A complete 10 system review of systems was obtained and all systems are negative except as noted in the HPI and PMH.      Allergies  Peanuts; Latex; and Penicillins  Home Medications   Prior to Admission medications   Medication Sig Start Date End Date Taking? Authorizing Provider  HYDROcodone-acetaminophen (NORCO/VICODIN) 5-325 MG per tablet Take 1 tablet by mouth every 4 (four) hours as needed. 08/24/14   Kristen N Ward, DO   ibuprofen (ADVIL,MOTRIN) 800 MG tablet Take 1 tablet (800 mg total) by mouth every 8 (eight) hours as needed for mild pain. 08/24/14   Kristen N Ward, DO  Iron-FA-B Cmp-C-Biot-Probiotic (FUSION PLUS) CAPS Take 1 capsule by mouth daily before breakfast. 08/05/14   Brock Badharles A Harper, MD  ondansetron (ZOFRAN ODT) 4 MG disintegrating tablet Take 1 tablet (4 mg total) by mouth every 8 (eight) hours as needed for nausea or vomiting. 08/24/14   Layla MawKristen N Ward, DO  Prenat w/o A Vit-FeFum-FePo-FA (CONCEPT OB) 130-92.4-1 MG CAPS Take 1 tablet by mouth daily. 12/26/13   Virginia Smith, CNM   BP 130/81 mmHg  Pulse 65  Temp(Src) 98.1 F (36.7 C) (Oral)  Resp 14  Ht 5\' 1"  (1.549 m)  Wt 161 lb (73.029 kg)  BMI 30.44 kg/m2  SpO2 98% Physical Exam  Constitutional: She is oriented to person, place, and time. She appears well-developed and well-nourished. No distress.  HENT:  Head: Normocephalic and atraumatic.  Nose: Nose normal.  Mouth/Throat: Oropharynx is clear and moist. No oropharyngeal exudate.  Eyes: Conjunctivae and EOM are normal. Pupils are equal, round, and reactive to light. No scleral icterus.  Neck: Normal range of motion. Neck supple. No JVD present. No tracheal deviation present. No thyromegaly  present.  Cardiovascular: Normal rate, regular rhythm and normal heart sounds.  Exam reveals no gallop and no friction rub.   No murmur heard. Pulmonary/Chest: Effort normal and breath sounds normal. No respiratory distress. She has no wheezes. She exhibits no tenderness.  Abdominal: Soft. Bowel sounds are normal. She exhibits no distension and no mass. There is no tenderness. There is no rebound and no guarding.  Musculoskeletal: Normal range of motion. She exhibits no edema or tenderness.  Lymphadenopathy:    She has no cervical adenopathy.  Neurological: She is alert and oriented to person, place, and time. No cranial nerve deficit. She exhibits normal muscle tone.  Skin: Skin is warm and dry. No  rash noted. No erythema. No pallor.  Nursing note and vitals reviewed.   ED Course  Procedures (including critical care time) DIAGNOSTIC STUDIES: Oxygen Saturation is 98% on room air, normal by my interpretation.    COORDINATION OF CARE: 1:56 AM Discussed treatment plan with patient at beside, the patient agrees with the plan and has no further questions at this time.   Labs Review Labs Reviewed  COMPREHENSIVE METABOLIC PANEL - Abnormal; Notable for the following:    Creatinine, Ser 1.06 (*)    All other components within normal limits  URINALYSIS, ROUTINE W REFLEX MICROSCOPIC (NOT AT [facility]) - Abnormal; Notable for the following:    APPearance CLOUDY (*)    Leukocytes, UA MODERATE (*)    All other components within normal limits  URINE MICROSCOPIC-ADD ON - Abnormal; Notable for the following:    Squamous Epithelial / LPF MANY (*)    All other components within normal limits  CBC WITH DIFFERENTIAL/PLATELET  LIPASE, BLOOD  POC URINE PREG, ED    Imaging Review US Abdomen Limited Ruq  09/04/2014   CLINICAL DATA:  Right upper quadrant pain.  History of gallstones.  EXAM: US ABDOMEN LIMITED - RIGHT UPPER QUADRANT  COMPARISON:  Most recent ultrasound 08/23/2014  FINDINGS: Gallbladder:  Physiologically distended. Multiple small gallstones again seen. No wall thickening visualized. No sonographic Ozga sign noted.  Common bile duct:  Diameter: 2.7 mm, normal.  Liver:  No focal lesion identified. Within normal limits in parenchymal echogenicity. Normal directional flow in the main portal vein.  IMPRESSION: Cholelithiasis without findings of acute cholecystitis.   Electronically Signed   By: Rubye Oaks M.D.   On: 09/04/2014 02:03     EKG Interpretation None      MDM   Final diagnoses:  RUQ pain   Patient presents to the ED for worsening RUQ pain.  This was after a salad which she states did not have any fatty additives to it.  Ultrasound is not reveal cholecystitis, she does  have cholelithiasis. She'll be advised to follow-up with surgery for continued management. Will refill her Norco prescription. She has Zofran at home. She appears well in no acute distress. Her vital signs were within her normal limits and she is safe for discharge.  I personally performed the services described in this documentation, which was scribed in my presence. The recorded information has been reviewed and is accurate.   Tomasita Crumble, MD 09/04/14 [phone]

## 2014-09-04 NOTE — Discharge Instructions (Signed)
Cholelithiasis Sharon Townsend, ultrasound shows gallstones, there is no infection. See surgery on June 15 during your appointment. If any symptoms worsen come back to emergency department immediately. Thank you. Cholelithiasis (also called gallstones) is a form of gallbladder disease. The gallbladder is a small organ that helps you digest fats. Symptoms of gallstones are:  Feeling sick to your stomach (nausea).  Throwing up (vomiting).  Belly pain.  Yellowing of the skin (jaundice).  Sudden pain. You may feel the pain for minutes to hours.  Fever.  Pain to the touch. HOME CARE  Only take medicines as told by your doctor.  Eat a low-fat diet until you see your doctor again. Eating fat can result in pain.  Follow up with your doctor as told. Attacks usually happen time after time. Surgery is usually needed for permanent treatment. GET HELP RIGHT AWAY IF:   Your pain gets worse.  Your pain is not helped by medicines.  You have a fever and lasting symptoms for more than 2-3 days.  You have a fever and your symptoms suddenly get worse.  You keep feeling sick to your stomach and throwing up. MAKE SURE YOU:   Understand these instructions.  Will watch your condition.  Will get help right away if you are not doing well or get worse. Document Released: 09/02/2007 Document Revised: 11/16/2012 Document Reviewed: 09/07/2012 [facility] Patient Information 2015 [employer], [place]. This information is not intended to replace advice given to you by your health care provider. Make sure you discuss any questions you have with your health care provider.

## 2014-09-12 ENCOUNTER — Ambulatory Visit: Payer: Self-pay | Admitting: Surgery

## 2014-09-26 ENCOUNTER — Encounter (HOSPITAL_COMMUNITY): Payer: Self-pay | Admitting: *Deleted

## 2014-09-27 ENCOUNTER — Encounter (HOSPITAL_COMMUNITY): Payer: Self-pay | Admitting: *Deleted

## 2014-10-01 ENCOUNTER — Emergency Department (HOSPITAL_COMMUNITY)
Admission: EM | Admit: 2014-10-01 | Discharge: 2014-10-01 | Disposition: A | Discharge status: Home / Self Care | Payer: No Typology Code available for payment source | Attending: Emergency Medicine | Admitting: Emergency Medicine

## 2014-10-01 ENCOUNTER — Encounter (HOSPITAL_COMMUNITY): Payer: Self-pay | Admitting: Emergency Medicine

## 2014-10-01 ENCOUNTER — Emergency Department (HOSPITAL_COMMUNITY): Payer: No Typology Code available for payment source

## 2014-10-01 DIAGNOSIS — Y9389 Activity, other specified: Secondary | ICD-10-CM | POA: Diagnosis not present

## 2014-10-01 DIAGNOSIS — Y998 Other external cause status: Secondary | ICD-10-CM | POA: Insufficient documentation

## 2014-10-01 DIAGNOSIS — Z880 Allergy status to penicillin: Secondary | ICD-10-CM | POA: Insufficient documentation

## 2014-10-01 DIAGNOSIS — S022XXB Fracture of nasal bones, initial encounter for open fracture: Secondary | ICD-10-CM | POA: Insufficient documentation

## 2014-10-01 DIAGNOSIS — Z8659 Personal history of other mental and behavioral disorders: Secondary | ICD-10-CM | POA: Diagnosis not present

## 2014-10-01 DIAGNOSIS — Z87448 Personal history of other diseases of urinary system: Secondary | ICD-10-CM | POA: Diagnosis not present

## 2014-10-01 DIAGNOSIS — Z3202 Encounter for pregnancy test, result negative: Secondary | ICD-10-CM | POA: Diagnosis not present

## 2014-10-01 DIAGNOSIS — Z91040 Latex allergy status: Secondary | ICD-10-CM | POA: Insufficient documentation

## 2014-10-01 DIAGNOSIS — S0990XA Unspecified injury of head, initial encounter: Secondary | ICD-10-CM | POA: Diagnosis present

## 2014-10-01 DIAGNOSIS — R55 Syncope and collapse: Secondary | ICD-10-CM | POA: Insufficient documentation

## 2014-10-01 DIAGNOSIS — S0121XA Laceration without foreign body of nose, initial encounter: Secondary | ICD-10-CM | POA: Diagnosis not present

## 2014-10-01 DIAGNOSIS — Z8719 Personal history of other diseases of the digestive system: Secondary | ICD-10-CM | POA: Insufficient documentation

## 2014-10-01 DIAGNOSIS — Y92410 Unspecified street and highway as the place of occurrence of the external cause: Secondary | ICD-10-CM | POA: Insufficient documentation

## 2014-10-01 DIAGNOSIS — Z79899 Other long term (current) drug therapy: Secondary | ICD-10-CM | POA: Insufficient documentation

## 2014-10-01 DIAGNOSIS — S161XXA Strain of muscle, fascia and tendon at neck level, initial encounter: Secondary | ICD-10-CM

## 2014-10-01 LAB — CBC
HEMATOCRIT: 39 % (ref 36.0–46.0)
Hemoglobin: 13.4 g/dL (ref 12.0–15.0)
MCH: 28.8 pg (ref 26.0–34.0)
MCHC: 34.4 g/dL (ref 30.0–36.0)
MCV: 83.9 fL (ref 78.0–100.0)
PLATELETS: 268 10*3/uL (ref 150–400)
RBC: 4.65 MIL/uL (ref 3.87–5.11)
RDW: 13.1 % (ref 11.5–15.5)
WBC: 9.3 10*3/uL (ref 4.0–10.5)

## 2014-10-01 LAB — RAPID URINE DRUG SCREEN, HOSP PERFORMED
Amphetamines: NOT DETECTED
BARBITURATES: NOT DETECTED
Benzodiazepines: NOT DETECTED
Cocaine: NOT DETECTED
OPIATES: NOT DETECTED
Tetrahydrocannabinol: NOT DETECTED

## 2014-10-01 LAB — ETHANOL

## 2014-10-01 LAB — BASIC METABOLIC PANEL
Anion gap: 10 (ref 5–15)
BUN: 10 mg/dL (ref 6–20)
CO2: 24 mmol/L (ref 22–32)
CREATININE: 1.03 mg/dL — AB (ref 0.44–1.00)
Calcium: 9.9 mg/dL (ref 8.9–10.3)
Chloride: 107 mmol/L (ref 101–111)
GFR calc non Af Amer: >60 mL/min (ref >60)
GLUCOSE: 103 mg/dL — AB (ref 65–99)
POTASSIUM: 3.7 mmol/L (ref 3.5–5.1)
Sodium: 141 mmol/L (ref 135–145)

## 2014-10-01 LAB — POC URINE PREG, ED: Preg Test, Ur: NEGATIVE

## 2014-10-01 MED ORDER — CEPHALEXIN 500 MG PO CAPS: 500.0000 mg | ORAL_CAPSULE | Freq: Four times a day (QID) | ORAL | Status: DC

## 2014-10-01 MED ORDER — ACETAMINOPHEN 500 MG PO TABS
1000.0000 mg | ORAL_TABLET | Freq: Once | ORAL | Status: AC
  Administered 2014-10-01: 1000 mg via ORAL
  Filled 2014-10-01: qty 2

## 2014-10-01 MED ORDER — CEPHALEXIN 250 MG PO CAPS
500.0000 mg | ORAL_CAPSULE | Freq: Once | ORAL | Status: AC
  Administered 2014-10-01: 500 mg via ORAL
  Filled 2014-10-01: qty 2

## 2014-10-01 MED ORDER — LIDOCAINE-EPINEPHRINE 1 %-1:100000 IJ SOLN
10.0000 mL | Freq: Once | INTRAMUSCULAR | Status: AC
  Administered 2014-10-01: 10 mL
  Filled 2014-10-01: qty 1
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
## 2014-10-01 NOTE — Discharge Instructions (Signed)
1. Medications: Tylenol for pain, Take Keflex as directed and complete the course, usual home medications 2. Treatment: ice for swelling, keep wound clean with warm soap and water and keep bandage dry, do not submerge in water for 24 hours 3. Follow Up: Please f/u with ENT (Dr. Jearld FentonByers)  7 days to have your stitches/staples removed or sooner if you have concerns. Return to the emergency department for increased redness, drainage of pus from the wound   WOUND CARE  Keep area clean and dry for 24 hours. Do not remove bandage, if applied.  After 24 hours, remove bandage and wash wound gently with mild soap and warm water. Reapply a new bandage after cleaning wound, if directed.   Continue daily cleansing with soap and water until stitches/staples are removed.  Do not apply any ointments or creams to the wound while stitches/staples are in place, as this may cause delayed healing. Return if you experience any of the following signs of infection: Swelling, redness, pus drainage, streaking, fever >101.0 F  Return if you experience excessive bleeding that does not stop after 15-20 minutes of constant, firm pressure.
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
## 2014-10-03 ENCOUNTER — Encounter (HOSPITAL_COMMUNITY): Payer: Self-pay | Admitting: Surgery

## 2014-10-03 DIAGNOSIS — K8010 Calculus of gallbladder with chronic cholecystitis without obstruction: Secondary | ICD-10-CM | POA: Diagnosis present

## 2014-10-03 NOTE — H&P (Signed)
General Surgery [facility]  [name] r. Sharon Townsend DOB: 1990/10/05 Single / Language: [place] / Race: Undefined Female  History of Present Illness Patient words: gallbladder.  The patient is a 24 year old female who presents for evaluation of gall stones. Patient referred by Dr. Tomasita Crumble in the ER for management of symptomatic cholelithiasis. Patient developed intermittent right upper quadrant pain following the birth of her first child 4 weeks ago. She is now experiencing pain with each meal. She has had intermittent nausea and vomiting. She has had approximately a 10 pound weight loss. She is breast-feeding. Patient went to the emergency room on 2 occasions. On September 04, 2014 she had an ultrasound of the abdomen which shows multiple small gallstones. There was no biliary dilatation and no sign of infection. Patient is now referred to general surgery for management of symptomatic cholelithiasis. Patient has a family history of gallbladder disease in both her mother and her sister. Patient denies any hepatobiliary or pancreatic disease in the past. She has had no other abdominal surgery.   Other Problems Cholelithiasis Hemorrhoids  Past Surgical History Oral Surgery  Diagnostic Studies History Colonoscopy 1-5 years ago  Allergies Penicillin V *PENICILLINS* Latex  Medication History Hydrocodone-Acetaminophen (5-500MG  Tablet, Oral) Active. Ibuprofen (  Tablet, Oral) Active. Iron-Vitamins (Oral) Active. Ondansetron (  Tablet Disperse, Oral) Active. Prenatabs FA (Oral) Active. Medications Reconciled  Social History Alcohol use Occasional alcohol use. Caffeine use Coffee. No drug use Tobacco use Former smoker.  Family History  Alcohol Abuse Father. Colon Cancer Family Members In General. Diabetes Mellitus Father, Mother. Heart disease in female family member before age 54 Hypertension Father, Mother.  Review of  Systems General Present- Appetite Loss and Fever. Not Present- Chills, Fatigue, Night Sweats, Weight Gain and Weight Loss. Skin Not Present- Change in Wart/Mole, Dryness, Hives, Jaundice, New Lesions, Non-Healing Wounds, Rash and Ulcer. HEENT Not Present- Earache, Hearing Loss, Hoarseness, Nose Bleed, Oral Ulcers, Ringing in the Ears, Seasonal Allergies, Sinus Pain, Sore Throat, Visual Disturbances, Wears glasses/contact lenses and Yellow Eyes. Respiratory Not Present- Bloody sputum, Chronic Cough, Difficulty Breathing, Snoring and Wheezing. Breast Not Present- Breast Mass, Breast Pain, Nipple Discharge and Skin Changes. Cardiovascular Present- Chest Pain and Difficulty Breathing Lying Down. Not Present- Leg Cramps, Palpitations, Rapid Heart Rate, Shortness of Breath and Swelling of Extremities. Gastrointestinal Present- Abdominal Pain, Nausea and Vomiting. Not Present- Bloating, Bloody Stool, Change in Bowel Habits, Chronic diarrhea, Constipation, Difficulty Swallowing, Excessive gas, Gets full quickly at meals, Hemorrhoids, Indigestion and Rectal Pain. Musculoskeletal Not Present- Back Pain, Joint Pain, Joint Stiffness, Muscle Pain, Muscle Weakness and Swelling of Extremities. Neurological Present- Headaches. Not Present- Decreased Memory, Fainting, Numbness, Seizures, Tingling, Tremor, Trouble walking and Weakness. Psychiatric Not Present- Anxiety, Bipolar, Change in Sleep Pattern, Depression, Fearful and Frequent crying. Endocrine Not Present- Cold Intolerance, Excessive Hunger, Hair Changes, Heat Intolerance, Hot flashes and New Diabetes. Hematology Not Present- Easy Bruising, Excessive bleeding, Gland problems, HIV and Persistent Infections.   Vitals  Weight: 155 lb Height: 61in Body Surface Area: 1.74 m Body Mass Index: 29.29 kg/m Temp.: 98.37F(Oral)  Pulse: 102 (Regular)  BP: 128/78 (Sitting, Left Arm, Standard)    Physical Exam  General - appears comfortable, no  distress; not diaphorectic  HEENT - normocephalic; sclerae clear, gaze conjugate; mucous membranes moist, dentition good; voice normal  Neck - symmetric on extension; no palpable anterior or posterior cervical adenopathy; no palpable masses in the thyroid bed  Chest - clear bilaterally without rhonchi, rales, or wheeze  Cor - regular rhythm with normal rate; no significant murmur  Abd - soft without distension; no palpable masses in the right upper quadrant; mild tenderness to deep palpation; negative Ospina sign  Ext - non-tender without significant edema or lymphedema  Neuro - grossly intact; no tremor    Assessment & Plan  CHRONIC CHOLECYSTITIS WITH CALCULUS (574.10  K80.10)  Patient presents accompanied by her mother to discuss cholecystectomy. Written literature on gallbladder surgery is provided to the patient to review at home.  I have recommended that the patient proceed with laparoscopic cholecystectomy for management of symptomatic cholelithiasis. We discussed risk and benefits of the procedure including the potential for open surgery. We have discussed the hospital stay and the postoperative recovery to be anticipated. As the patient is breast-feeding, she will need to pump extra milk for storage. She will need to discard milk on the day of surgery and the day following surgery.  The risks and benefits of the procedure have been discussed at length with the patient. The patient understands the proposed procedure, potential alternative treatments, and the course of recovery to be expected. All of the patient's questions have been answered at this time. The patient wishes to proceed with surgery.  Velora Hecklerodd M. Khoury Siemon, MD, [facility] Office: [phone]

## 2014-10-04 ENCOUNTER — Ambulatory Visit (HOSPITAL_COMMUNITY): Admission: RE | Admit: 2014-10-04 | Payer: Medicaid Other | Source: Ambulatory Visit | Admitting: Surgery

## 2014-10-04 ENCOUNTER — Encounter (HOSPITAL_COMMUNITY): Payer: Self-pay | Admitting: Anesthesiology

## 2014-10-04 SURGERY — LAPAROSCOPIC CHOLECYSTECTOMY WITH INTRAOPERATIVE CHOLANGIOGRAM
Anesthesia: General

## 2014-10-04 NOTE — Anesthesia Preprocedure Evaluation (Deleted)
Anesthesia Evaluation  Patient identified by MRN, date of birth, ID band Patient awake    Reviewed: Allergy & Precautions, H&P , NPO status , Patient's Chart, lab work & pertinent test results, reviewed documented beta blocker date and time , Unable to perform ROS - Chart review only  History of Anesthesia Complications Negative for: history of anesthetic complications  Airway Mallampati: III  TM Distance: >3 FB Neck ROM: full    Dental no notable dental hx.    Pulmonary neg pulmonary ROS,  breath sounds clear to auscultation  Pulmonary exam normal       Cardiovascular Normal cardiovascular examRhythm:regular Rate:Normal     Neuro/Psych PSYCHIATRIC DISORDERS Bipolar Disorder negative neurological ROS  negative psych ROS   GI/Hepatic negative GI ROS, Neg liver ROS,   Endo/Other  negative endocrine ROSMorbid obesity  Renal/GU Renal disease     Musculoskeletal   Abdominal (+) + obese,   Peds  Hematology negative hematology ROS (+)   Anesthesia Other Findings   Reproductive/Obstetrics negative OB ROS                           Anesthesia Physical Anesthesia Plan  ASA: II  Anesthesia Plan: General   Post-op Pain Management:    Induction:   Airway Management Planned:   Additional Equipment:   Intra-op Plan:   Post-operative Plan:   Informed Consent: I have reviewed the patients History and Physical, chart, labs and discussed the procedure including the risks, benefits and alternatives for the proposed anesthesia with the patient or authorized representative who has indicated his/her understanding and acceptance.   Dental Advisory Given  Plan Discussed with: Anesthesiologist  Anesthesia Plan Comments:         Anesthesia Quick Evaluation

## 2015-06-13 ENCOUNTER — Encounter (HOSPITAL_COMMUNITY): Payer: Self-pay

## 2015-06-13 ENCOUNTER — Emergency Department (HOSPITAL_COMMUNITY): Payer: Medicaid Other

## 2015-06-13 ENCOUNTER — Emergency Department (HOSPITAL_COMMUNITY)
Admission: EM | Admit: 2015-06-13 | Discharge: 2015-06-13 | Disposition: A | Discharge status: Home / Self Care | Payer: Medicaid Other | Attending: Emergency Medicine | Admitting: Emergency Medicine

## 2015-06-13 DIAGNOSIS — Y9389 Activity, other specified: Secondary | ICD-10-CM | POA: Insufficient documentation

## 2015-06-13 DIAGNOSIS — Z87448 Personal history of other diseases of urinary system: Secondary | ICD-10-CM | POA: Diagnosis not present

## 2015-06-13 DIAGNOSIS — F319 Bipolar disorder, unspecified: Secondary | ICD-10-CM | POA: Diagnosis not present

## 2015-06-13 DIAGNOSIS — W19XXXA Unspecified fall, initial encounter: Secondary | ICD-10-CM

## 2015-06-13 DIAGNOSIS — S62015A Nondisplaced fracture of distal pole of navicular [scaphoid] bone of left wrist, initial encounter for closed fracture: Secondary | ICD-10-CM | POA: Insufficient documentation

## 2015-06-13 DIAGNOSIS — Y998 Other external cause status: Secondary | ICD-10-CM | POA: Diagnosis not present

## 2015-06-13 DIAGNOSIS — S199XXA Unspecified injury of neck, initial encounter: Secondary | ICD-10-CM | POA: Insufficient documentation

## 2015-06-13 DIAGNOSIS — S62002A Unspecified fracture of navicular [scaphoid] bone of left wrist, initial encounter for closed fracture: Secondary | ICD-10-CM

## 2015-06-13 DIAGNOSIS — Y9289 Other specified places as the place of occurrence of the external cause: Secondary | ICD-10-CM | POA: Diagnosis not present

## 2015-06-13 DIAGNOSIS — W1839XA Other fall on same level, initial encounter: Secondary | ICD-10-CM | POA: Diagnosis not present

## 2015-06-13 DIAGNOSIS — M549 Dorsalgia, unspecified: Secondary | ICD-10-CM

## 2015-06-13 DIAGNOSIS — Z880 Allergy status to penicillin: Secondary | ICD-10-CM | POA: Diagnosis not present

## 2015-06-13 DIAGNOSIS — M542 Cervicalgia: Secondary | ICD-10-CM

## 2015-06-13 DIAGNOSIS — S299XXA Unspecified injury of thorax, initial encounter: Secondary | ICD-10-CM | POA: Diagnosis not present

## 2015-06-13 DIAGNOSIS — S6992XA Unspecified injury of left wrist, hand and finger(s), initial encounter: Secondary | ICD-10-CM | POA: Diagnosis present

## 2015-06-13 DIAGNOSIS — Z91040 Latex allergy status: Secondary | ICD-10-CM | POA: Diagnosis not present

## 2015-06-13 DIAGNOSIS — T162XXA Foreign body in left ear, initial encounter: Secondary | ICD-10-CM | POA: Insufficient documentation

## 2015-06-13 DIAGNOSIS — Z79899 Other long term (current) drug therapy: Secondary | ICD-10-CM | POA: Insufficient documentation

## 2015-06-13 MED ORDER — NAPROXEN 500 MG PO TABS: 500.0000 mg | ORAL_TABLET | Freq: Two times a day (BID) | ORAL | Status: DC

## 2015-06-13 MED ORDER — TRAMADOL HCL 50 MG PO TABS
50.0000 mg | ORAL_TABLET | Freq: Four times a day (QID) | ORAL | Status: DC | PRN
Start: 2015-06-13 — End: 2015-10-22

## 2015-06-13 NOTE — ED Notes (Signed)
[place] and she slipped on ice two days ago and reports pain to shoulders, upper back, neck and left wrist. Denies LOC or head injury. Pt ambulatory.

## 2015-06-13 NOTE — ED Notes (Signed)
Patient able to ambulate independently  

## 2015-06-13 NOTE — Progress Notes (Signed)
Orthopedic Tech Progress Note Patient Details:  Sharon FriedlanderBrittany R Townsend 09-10-1990 [id number]  Ortho Devices Type of Ortho Device: Ace wrap, Thumb spica splint Splint Material: Fiberglass Ortho Device/Splint Location: LUE Ortho Device/Splint Interventions: Ordered, Application   Jennye MoccasinHughes, Nechelle Petrizzo Craig 06/13/2015, 4:57 PM

## 2015-06-13 NOTE — ED Provider Notes (Signed)
CSN: [id number]     Arrival date & time 06/13/15  1426 History  By signing my name below, I, Tanda Rockers, attest that this documentation has been prepared under the direction and in the presence of [employer], PA-C.  Electronically Signed: Tanda Rockers, ED Scribe. 06/13/2015. 3:25 PM.   Chief Complaint  Patient presents with  . Fall   The history is provided by the patient. No language interpreter was used.     HPI Comments: Sharon Townsend is a 25 y.o. female who presents to the Emergency Department complaining of gradual onset, constant, upper back pain, bilateral shoulder pain, neck pain, and left wrist pain s/p ground level fall that occurred 2 days ago. [place] she [place] on ice and fell, landing onto her bilateral shoulders and left wrist. No head injury or LOC.  Pt has been able to ambulate without difficulty. The pain is exacerbated with movement. She has been taking Ibuprofen without relief. Denies weakness, numbness, tingling, or any other associated symptoms.   Past Medical History  Diagnosis Date  . Chronic constipation   . Bipolar disorder (HCC)   . Gall bladder stones   . Renal disorder     two tubes in left kidney going to bladder   Past Surgical History  Procedure Laterality Date  . Renal stents    . Tonsillectomy    . Mandible surgery    . Childbirth  08-03-14  . Cholecystectomy     Family History  Problem Relation Age of Onset  . Diabetes Mother   . Diabetes Father   . Hypertension Father    Social History  Substance Use Topics  . Smoking status: Never Smoker   . Smokeless tobacco: Never Used  . Alcohol Use: No     Comment: before preg   OB History    Gravida Para Term Preterm AB TAB SAB Ectopic Multiple Living   0 1     Review of Systems  A complete 10 system review of systems was obtained and all systems are negative except as noted in the HPI and PMH.   Allergies  Peanuts; Latex; and  Penicillins  Home Medications   Prior to Admission medications   Medication Sig Start Date End Date Taking? Authorizing Provider  cephALEXin (KEFLEX) 500 MG capsule Take 1 capsule (500 mg total) by mouth 4 (four) times daily. 10/01/14   Hannah Muthersbaugh, PA-C  HYDROcodone-acetaminophen (NORCO/VICODIN) 5-325 MG per tablet Take 1 tablet by mouth 2 (two) times daily as needed for severe pain. Patient not taking: Reported on 10/01/2014 09/04/14   Tomasita Crumble, MD  ibuprofen (ADVIL,MOTRIN) 800 MG tablet Take 1 tablet (800 mg total) by mouth every 8 (eight) hours as needed for mild pain. 08/24/14   Kristen N Ward, DO  Iron-FA-B Cmp-C-Biot-Probiotic (FUSION PLUS) CAPS Take 1 capsule by mouth daily before breakfast. 08/05/14   Brock Bad, MD  ondansetron (ZOFRAN ODT) 4 MG disintegrating tablet Take 1 tablet (4 mg total) by mouth every 8 (eight) hours as needed for nausea or vomiting. Patient not taking: Reported on 10/01/2014 08/24/14   Layla Maw Ward, DO  Prenat w/o A Vit-FeFum-FePo-FA (CONCEPT OB) 130-92.4-1 MG CAPS Take 1 tablet by mouth daily. Patient not taking: Reported on 10/01/2014 12/26/13   Dorathy Kinsman, CNM   BP 120/83 mmHg  Pulse 77  Temp(Src) 98.6 F (37 C) (Oral)  Resp 16  Ht 5' (  1.524 m)  Wt 162 lb 8 oz (73.71 kg)  BMI 31.74 kg/m2  SpO2 100%  LMP 06/12/2015   Physical Exam  Constitutional: She is oriented to person, place, and time. She appears well-developed and well-nourished. No distress.  HENT:  Head: Normocephalic and atraumatic.  Eyes: Conjunctivae and EOM are normal.  Neck: Neck supple. No tracheal deviation present.  Cardiovascular: Normal rate, regular rhythm and normal heart sounds.   Pulses:      Radial pulses are 2+ on the right side, and 2+ on the left side.  Pulmonary/Chest: Effort normal and breath sounds normal. No respiratory distress. She has no wheezes. She has no rhonchi. She has no rales.  Musculoskeletal: Normal range of motion. She exhibits tenderness.   TTP of left wrist.  No erythema or edema.  Increased pain with flexion and extension.  Mild snuff box tenderness.  Full ROM of left elbow and left shoulder.  Tenderness of cervical and thoracic spine. No step offs or deformities.  No tenderness of the lumbar spine.   Neurological: She is alert and oriented to person, place, and time.  Distal sensation of both hands intact  Skin: Skin is warm and dry.  Psychiatric: She has a normal mood and affect. Her behavior is normal.  Nursing note and vitals reviewed.   ED Course  Procedures (including critical care time)  DIAGNOSTIC STUDIES: Oxygen Saturation is 100% on RA, normal by my interpretation.    COORDINATION OF CARE: 3:24 PM-Discussed treatment plan which includes DG L Wrist, DG C Spine, DG T Spine with pt at bedside and pt agreed to plan.   Labs Review Labs Reviewed - No data to display  Imaging Review Dg Cervical Spine Complete  06/13/2015  CLINICAL DATA:  Neck pain following fall 2 days ago, initial encounter EXAM: CERVICAL SPINE - COMPLETE 4+ VIEW COMPARISON:  10/01/2014 FINDINGS: There is no evidence of cervical spine fracture or prevertebral soft tissue swelling. Alignment is normal. No other significant bone abnormalities are identified. IMPRESSION: No acute abnormality noted. Electronically Signed   By: Alcide Clever M.D.   On: 06/13/2015 16:19   Dg Thoracic Spine 2 View  06/13/2015  CLINICAL DATA:  Neck pain bilateral shoulder pain.  Fall 2 days ago. EXAM: THORACIC SPINE 2 VIEWS COMPARISON:  CT chest 08/23/2014 FINDINGS: There is no evidence of thoracic spine fracture. Alignment is normal. No other significant bone abnormalities are identified. Mild thoracic dextroscoliosis. IMPRESSION: Negative. Electronically Signed   By: Marlan Palau M.D.   On: 06/13/2015 16:21   Dg Wrist Complete Left  06/13/2015  CLINICAL DATA:  Pain following fall 2 days prior EXAM: LEFT WRIST - COMPLETE 3+ VIEW COMPARISON:  None. FINDINGS: Frontal,  oblique, lateral, and ulnar deviation scaphoid images were obtained. On the ulnar deviation scaphoid view, there is a linear lucency in the distal scaphoid bone. This finding is not apparent on other views. No other areas concerning for potential fracture identified. No dislocation. The joint spaces appear normal. No erosive change. IMPRESSION: Linear lucency in the distal scaphoid bone, seen only on the ulnar deviation scaphoid image. This lucency may represent a prominent nutrient foramen. However, a nondisplaced fracture of the distal scaphoid cannot be excluded. If patient is focally tender at the site of the distal scaphoid on physical examination, would advise immobilization with reimaging in approximately 7 days. No other findings suspicious for potential fracture. No dislocation. No apparent arthropathy. Electronically Signed   By: Bretta Bang III M.D.   On:  06/13/2015 16:28   I have personally reviewed and evaluated these images as part of my medical decision-making.   EKG Interpretation None      MDM   Final diagnoses:  None   Patient presents today with neck pain, upper back pain, and left wrist pain that has been present since falling 2 days ago.  Xray of thoracic spine and cervical spine negative for acute findings.  Xray of wrist showing possible fracture of the scaphoid.  Patient does have mild tenderness of the snuffbox.  Patient given thumb spica and referral to hand surgery.  Stable for discharge.  Return precautions given.  I personally performed the services described in this documentation, which was scribed in my presence. The recorded information has been reviewed and is accurate.      Santiago GladHeather Susen Haskew, PA-C 06/13/15 2303  Bethann BerkshireJoseph Zammit, MD 06/15/15 1537

## 2015-06-13 NOTE — ED Notes (Signed)
This RN went in to discharge patient and patient states she was also concerned about a bug that flew in her ear last night.  PA went in and dead bug was noted.  Two attempts at flushing it did not work to remove bug.

## 2015-06-13 NOTE — ED Notes (Signed)
Ortho at bedside.

## 2015-06-13 NOTE — ED Notes (Signed)
PA at bedside attempt bug removal at this time

## 2015-06-13 NOTE — ED Notes (Signed)
Paged ortho 

## 2015-09-20 ENCOUNTER — Inpatient Hospital Stay (HOSPITAL_COMMUNITY)
Admission: AD | Admit: 2015-09-20 | Discharge: 2015-09-21 | Disposition: A | Discharge status: Home / Self Care | Payer: Medicaid Other | Source: Ambulatory Visit | Attending: Obstetrics | Admitting: Obstetrics

## 2015-09-20 DIAGNOSIS — Z3202 Encounter for pregnancy test, result negative: Secondary | ICD-10-CM | POA: Insufficient documentation

## 2015-09-20 DIAGNOSIS — R110 Nausea: Secondary | ICD-10-CM

## 2015-09-20 DIAGNOSIS — R1030 Lower abdominal pain, unspecified: Secondary | ICD-10-CM

## 2015-09-20 DIAGNOSIS — Z880 Allergy status to penicillin: Secondary | ICD-10-CM | POA: Insufficient documentation

## 2015-09-20 DIAGNOSIS — R109 Unspecified abdominal pain: Secondary | ICD-10-CM | POA: Insufficient documentation

## 2015-09-21 ENCOUNTER — Encounter (HOSPITAL_COMMUNITY): Payer: Self-pay | Admitting: *Deleted

## 2015-09-21 DIAGNOSIS — R1030 Lower abdominal pain, unspecified: Secondary | ICD-10-CM | POA: Diagnosis not present

## 2015-09-21 DIAGNOSIS — R109 Unspecified abdominal pain: Secondary | ICD-10-CM | POA: Diagnosis not present

## 2015-09-21 DIAGNOSIS — Z3202 Encounter for pregnancy test, result negative: Secondary | ICD-10-CM | POA: Diagnosis not present

## 2015-09-21 DIAGNOSIS — R110 Nausea: Secondary | ICD-10-CM | POA: Diagnosis not present

## 2015-09-21 DIAGNOSIS — Z880 Allergy status to penicillin: Secondary | ICD-10-CM | POA: Diagnosis not present

## 2015-09-21 LAB — RAPID URINE DRUG SCREEN, HOSP PERFORMED
Amphetamines: NOT DETECTED
BARBITURATES: NOT DETECTED
Benzodiazepines: NOT DETECTED
COCAINE: NOT DETECTED
Opiates: NOT DETECTED
TETRAHYDROCANNABINOL: NOT DETECTED

## 2015-09-21 LAB — WET PREP, GENITAL
CLUE CELLS WET PREP: NONE SEEN
Sperm: NONE SEEN
TRICH WET PREP: NONE SEEN
Yeast Wet Prep HPF POC: NONE SEEN

## 2015-09-21 LAB — COMPREHENSIVE METABOLIC PANEL
ALBUMIN: 4.3 g/dL (ref 3.5–5.0)
ALK PHOS: 87 U/L (ref 38–126)
ALT: 21 U/L (ref 14–54)
ANION GAP: 6 (ref 5–15)
AST: 22 U/L (ref 15–41)
BILIRUBIN TOTAL: 0.3 mg/dL (ref 0.3–1.2)
BUN: 17 mg/dL (ref 6–20)
CO2: 27 mmol/L (ref 22–32)
CREATININE: 1.19 mg/dL — AB (ref 0.44–1.00)
Calcium: 9.3 mg/dL (ref 8.9–10.3)
Chloride: 104 mmol/L (ref 101–111)
GFR calc non Af Amer: >60 mL/min (ref >60)
GLUCOSE: 98 mg/dL (ref 65–99)
Potassium: 4 mmol/L (ref 3.5–5.1)
SODIUM: 137 mmol/L (ref 135–145)
TOTAL PROTEIN: 7.2 g/dL (ref 6.5–8.1)

## 2015-09-21 LAB — URINALYSIS, ROUTINE W REFLEX MICROSCOPIC
Bilirubin Urine: NEGATIVE
Glucose, UA: NEGATIVE mg/dL
HGB URINE DIPSTICK: NEGATIVE
Ketones, ur: NEGATIVE mg/dL
LEUKOCYTES UA: NEGATIVE
Nitrite: NEGATIVE
PROTEIN: NEGATIVE mg/dL
SPECIFIC GRAVITY, URINE: 1.025 (ref 1.005–1.030)
pH: 6 (ref 5.0–8.0)

## 2015-09-21 LAB — CBC
HEMATOCRIT: 37 % (ref 36.0–46.0)
Hemoglobin: 12.9 g/dL (ref 12.0–15.0)
MCH: 30.1 pg (ref 26.0–34.0)
MCHC: 34.9 g/dL (ref 30.0–36.0)
MCV: 86.2 fL (ref 78.0–100.0)
Platelets: 256 10*3/uL (ref 150–400)
RBC: 4.29 MIL/uL (ref 3.87–5.11)
RDW: 12.1 % (ref 11.5–15.5)
WBC: 12.1 10*3/uL — ABNORMAL HIGH (ref 4.0–10.5)

## 2015-09-21 LAB — POCT PREGNANCY, URINE: Preg Test, Ur: NEGATIVE

## 2015-09-21 NOTE — MAU Note (Signed)
Having abd in lower abd, vomiting, headaches, lower back pain. Symptoms present for a week but worse today. Denies vag bleeding or d/c

## 2015-09-21 NOTE — Discharge Instructions (Signed)
Preparing for Pregnancy Before trying to become pregnant, make an appointment with your health care provider (preconception care). The goal is to help you have a healthy, safe pregnancy. At your first appointment, your health care provider will:   Do a complete physical exam, including a Pap test.  Take a complete medical history.  Give you advice and help you resolve any problems. PRECONCEPTION CHECKLIST Here is a list of the basics to cover with your health care provider at your preconception visit:  Medical history.  Tell your health care provider about any diseases you have had. Many diseases can affect your pregnancy.  Include your partner's medical history and family history.  Make sure you have been tested for sexually transmitted infections (STIs). These can affect your pregnancy. In some cases, they can be passed to your baby. Tell your health care provider about any history of STIs.  Make sure your health care provider knows about any previous problems you have had with conception or pregnancy.  Tell your health care provider about any medicine you take. This includes herbal supplements and over-the-counter medicines.  Make sure all your immunizations are up to date. You may need to make additional appointments.  Ask your health care provider if you need any vaccinations or if there are any you should avoid.  Diet.  It is especially important to eat a healthy, balanced diet with the right nutrients when you are pregnant.  Ask your health care provider to help you get to a healthy weight before pregnancy.  If you are overweight, you are at higher risk for certain complications. These include high blood pressure, diabetes, and preterm birth.  If you are underweight, you are more likely to have a low-birth-weight baby.  Lifestyle.  Tell your health care provider about lifestyle factors such as alcohol use, drug use, or smoking.  Describe any harmful substances you may  be exposed to at work or home. These can include chemicals, pesticides, and radiation.  Mental health.  Let your health care provider know if you have been feeling depressed or anxious.  Let your health care provider know if you have a history of substance abuse.  Let your health care provider know if you do not feel safe at home. HOME INSTRUCTIONS TO PREPARE FOR PREGNANCY Follow your health care provider's advice and instructions.   Keep an accurate record of your menstrual periods. This makes it easier for your health care provider to determine your baby's due date.  Begin taking prenatal vitamins and folic acid supplements daily. Take them as directed by your health care provider.  Eat a balanced diet. Get help from a nutrition counselor if you have questions or need help.  Get regular exercise. Try to be active for at least 30 minutes a day most days of the week.  Quit smoking, if you smoke.  Do not drink alcohol.  Do not take illegal drugs.  Get medical problems, such as diabetes or high blood pressure, under control.  If you have diabetes, make sure you do the following:  Have good blood sugar control. If you have type 1 diabetes, use multiple daily doses of insulin. Do not use split-dose or premixed insulin.  Have an eye exam by a qualified eye care professional trained in caring for people with diabetes.  Get evaluated by your health care provider for cardiovascular disease.  Get to a healthy weight. If you are overweight or obese, reduce your weight with the help of a qualified health   professional such as a registered dietitian. Ask your health care provider what the right weight range is for you. HOW DO I KNOW I AM PREGNANT? You may be pregnant if you have been sexually active and you miss your period. Symptoms of early pregnancy include:   Mild cramping.  Very light vaginal bleeding (spotting).  Feeling unusually tired.  Morning sickness. If you have any of  these symptoms, take a home pregnancy test. These tests look for a hormone called human chorionic gonadotropin (hCG) in your urine. Your body begins to make this hormone during early pregnancy. These tests are very accurate. Wait until at least the first day you miss your period to take one. If you get a positive result, call your health care provider to make appointments for prenatal care. WHAT SHOULD I DO IF I BECOME PREGNANT?  Make an appointment with your health care provider by week 12 of your pregnancy at the latest.  Do not smoke. Smoking can be harmful to your baby.  Do not drink alcoholic beverages. Alcohol is related to a number of birth defects.  Avoid toxic odors and chemicals.  You may continue to have sexual intercourse if it does not cause pain or other problems, such as vaginal bleeding.   This information is not intended to replace advice given to you by your health care provider. Make sure you discuss any questions you have with your health care provider.   Document Released: 02/27/2008 Document Revised: 04/06/2014 Document Reviewed: 02/20/2013 Elsevier Interactive Patient Education 2016 [employer]  

## 2015-09-21 NOTE — MAU Provider Note (Signed)
History     CSN: [id number]  Arrival date and time: 09/20/15 2352   First Provider Initiated Contact with Patient 09/21/15 0159      Chief Complaint  Patient presents with  . Abdominal Pain  . Back Pain  . Emesis     HPI Comments: [name] is a 25 y.o. Female presenting with multiple complaints and all of her symptoms she thinks are related to pregnancy. Her LMP was 08/30/2015. She had all of these same symptoms when she was pregnant and wants to know if she is pregnant. The patient desires pregnancy   Patient is a 25 y.o. female presenting with abdominal pain, back pain, and vomiting. The history is provided by the patient.  Abdominal Pain The primary symptoms of the illness include abdominal pain and vomiting. The current episode started more than 2 days ago. The onset of the illness was gradual. The problem has not changed since onset. The patient states that she believes she is currently not pregnant. The patient has not had a change in bowel habit. Additional symptoms associated with the illness include back pain.  Back Pain  This is a new problem. The current episode started more than 2 days ago. The pain is associated with no known injury. The quality of the pain is described as aching. The pain does not radiate. The pain is at a severity of 8/10. Associated symptoms include abdominal pain.  Emesis  The problem occurs 2 to 4 times per day. There has been no fever. Associated symptoms include abdominal pain.  Patient was able to eat a normal dinner yesterday without emesis after.   OB History    Gravida Para Term Preterm AB TAB SAB Ectopic Multiple Living   3 2  1 1  1   0 1      Past Medical History  Diagnosis Date  . Chronic constipation   . Bipolar disorder ([facility])   . Gall bladder stones   . Renal disorder     two tubes in left kidney going to bladder    Past Surgical History  Procedure Laterality Date  . Renal stents    . Tonsillectomy    . Mandible  surgery    . Childbirth  08-03-14  . Cholecystectomy      Family History  Problem Relation Age of Onset  . Diabetes Mother   . Diabetes Father   . Hypertension Father     Social History  Substance Use Topics  . Smoking status: Never Smoker   . Smokeless tobacco: Never Used  . Alcohol Use: No     Comment: before preg    Allergies:  Allergies  Allergen Reactions  . Peanuts [Peanut Oil] Anaphylaxis  . Latex Swelling  . Penicillins Other (See Comments)    Reaction:  Pt states that it "shuts down her bowel movements".    Prescriptions prior to admission  Medication Sig Dispense Refill Last Dose  . cephALEXin (KEFLEX) 500 MG capsule Take 1 capsule (500 mg total) by mouth 4 (four) times daily. 40 capsule 0 More than a month at Unknown time  . HYDROcodone-acetaminophen (NORCO/VICODIN) 5-325 MG per tablet Take 1 tablet by mouth 2 (two) times daily as needed for severe pain. (Patient not taking: Reported on 10/01/2014) 10 tablet 0 More than a month at Unknown time  . ibuprofen (ADVIL,MOTRIN) 800 MG tablet Take 1 tablet (800 mg total) by mouth every 8 (eight) hours as needed for mild pain. 30 tablet 0 More than  a month at Unknown time  . Iron-FA-B Cmp-C-Biot-Probiotic (FUSION PLUS) CAPS Take 1 capsule by mouth daily before breakfast. 30 capsule 5 More than a month at Unknown time  . naproxen (NAPROSYN) 500 MG tablet Take 1 tablet (500 mg total) by mouth 2 (two) times daily. 30 tablet 0 More than a month at Unknown time  . ondansetron (ZOFRAN ODT) 4 MG disintegrating tablet Take 1 tablet (4 mg total) by mouth every 8 (eight) hours as needed for nausea or vomiting. (Patient not taking: Reported on 10/01/2014) 20 tablet 0 More than a month at Unknown time  . Prenat w/o A Vit-FeFum-FePo-FA (CONCEPT OB) 130-92.4-1 MG CAPS Take 1 tablet by mouth daily. (Patient not taking: Reported on 10/01/2014) 30 capsule 12 More than a month at Unknown time  . traMADol (ULTRAM) 50 MG tablet Take 1 tablet (50 mg  total) by mouth every 6 (six) hours as needed. 10 tablet 0 More than a month at Unknown time   Results for orders placed or performed during the hospital encounter of 09/20/15 (from the past 48 hour(s))  Urinalysis, Routine w reflex microscopic (not at [facility])     Status: None   Collection Time: 09/21/15 12:35 AM  Result Value Ref Range   Color, Urine YELLOW YELLOW   APPearance CLEAR CLEAR   Specific Gravity, Urine 1.025 1.005 - 1.030   pH 6.0 5.0 - 8.0   Glucose, UA NEGATIVE NEGATIVE mg/dL   Hgb urine dipstick NEGATIVE NEGATIVE   Bilirubin Urine NEGATIVE NEGATIVE   Ketones, ur NEGATIVE NEGATIVE mg/dL   Protein, ur NEGATIVE NEGATIVE mg/dL   Nitrite NEGATIVE NEGATIVE   Leukocytes, UA NEGATIVE NEGATIVE    Comment: MICROSCOPIC NOT DONE ON URINES WITH NEGATIVE PROTEIN, BLOOD, LEUKOCYTES, NITRITE, OR GLUCOSE <1000 mg/dL.  Pregnancy, urine POC     Status: None   Collection Time: 09/21/15 12:45 AM  Result Value Ref Range   Preg Test, Ur NEGATIVE NEGATIVE    Comment:        THE SENSITIVITY OF THIS METHODOLOGY IS >24 mIU/mL   CBC     Status: Abnormal   Collection Time: 09/21/15 12:48 AM  Result Value Ref Range   WBC 12.1 (H) 4.0 - 10.5 K/uL   RBC 4.29 3.87 - 5.11 MIL/uL   Hemoglobin 12.9 12.0 - 15.0 g/dL   HCT 37.0 36.0 - 46.0 %   MCV 86.2 78.0 - 100.0 fL   MCH 30.1 26.0 - 34.0 pg   MCHC 34.9 30.0 - 36.0 g/dL   RDW 12.1 11.5 - 15.5 %   Platelets 256 150 - 400 K/uL  Comprehensive metabolic panel     Status: Abnormal   Collection Time: 09/21/15 12:48 AM  Result Value Ref Range   Sodium 137 135 - 145 mmol/L   Potassium 4.0 3.5 - 5.1 mmol/L   Chloride 104 101 - 111 mmol/L   CO2 27 22 - 32 mmol/L   Glucose, Bld 98 65 - 99 mg/dL   BUN 17 6 - 20 mg/dL   Creatinine, Ser 1.19 (H) 0.44 - 1.00 mg/dL   Calcium 9.3 8.9 - 10.3 mg/dL   Total Protein 7.2 6.5 - 8.1 g/dL   Albumin 4.3 3.5 - 5.0 g/dL   AST 22 15 - 41 U/L   ALT 21 14 - 54 U/L   Alkaline Phosphatase 87 38 - 126 U/L   Total  Bilirubin 0.3 0.3 - 1.2 mg/dL   GFR calc non Af Amer >60 >60 mL/min   GFR  calc Af Amer >60 >60 mL/min    Comment: (NOTE) The eGFR has been calculated using the CKD EPI equation. This calculation has not been validated in all clinical situations. eGFR's persistently <60 mL/min signify possible Chronic Kidney Disease.    Anion gap 6 5 - 15    Review of Systems  Gastrointestinal: Positive for vomiting and abdominal pain.  Musculoskeletal: Positive for back pain.   Physical Exam   Blood pressure 126/75, pulse 81, temperature 98.1 F (36.7 C), resp. rate 18, height 5' (1.524 m), weight 161 lb 12.8 oz (73.392 kg), last menstrual period 08/30/2015, currently breastfeeding.  Physical Exam  Constitutional: She is oriented to person, place, and time. She appears well-developed and well-nourished. No distress.  HENT:  Head: Normocephalic.  Respiratory: Effort normal.  GI: Soft. She exhibits no distension. There is no tenderness. There is no rebound and no guarding.  Genitourinary:  Bimanual exam: Cervix closed, no CMT Uterus non tender, normal size Adnexa non tender, no masses bilaterally GC/Chlam, wet prep done Chaperone present for exam.  Musculoskeletal: Normal range of motion.  Neurological: She is alert and oriented to person, place, and time.  Skin: Skin is warm. She is not diaphoretic.  Psychiatric: Her behavior is normal.    MAU Course  Procedures  None  MDM  Urine pregnancy test negative UA normal CBC Wet prep GC pending  Patient drinking fluids in MAU, no vomiting noted   Assessment and Plan   A:  1. Encounter for pregnancy test with result negative   2. Nausea   3. Lower abdominal pain     P:  Discharge home in stable condition Patient instructed to take an at home pregnancy test if her menstrual period is missed in July Return to MAU for emergencies only If symptoms worsen, go to [facility] or [facility] for further workup.    Lezlie Lye, NP 09/21/2015 2:41 AM

## 2015-09-23 LAB — GC/CHLAMYDIA PROBE AMP (~~LOC~~) NOT AT ARMC
Chlamydia: NEGATIVE
Neisseria Gonorrhea: NEGATIVE

## 2015-10-22 ENCOUNTER — Inpatient Hospital Stay (HOSPITAL_COMMUNITY)
Admission: AD | Admit: 2015-10-22 | Discharge: 2015-10-22 | Disposition: A | Discharge status: Home / Self Care | Payer: Medicaid Other | Source: Ambulatory Visit | Attending: Obstetrics & Gynecology | Admitting: Obstetrics & Gynecology

## 2015-10-22 ENCOUNTER — Encounter (HOSPITAL_COMMUNITY): Payer: Self-pay | Admitting: *Deleted

## 2015-10-22 DIAGNOSIS — Z91040 Latex allergy status: Secondary | ICD-10-CM | POA: Insufficient documentation

## 2015-10-22 DIAGNOSIS — K5909 Other constipation: Secondary | ICD-10-CM | POA: Insufficient documentation

## 2015-10-22 DIAGNOSIS — Z91010 Allergy to peanuts: Secondary | ICD-10-CM | POA: Diagnosis not present

## 2015-10-22 DIAGNOSIS — Z3A01 Less than 8 weeks gestation of pregnancy: Secondary | ICD-10-CM | POA: Insufficient documentation

## 2015-10-22 DIAGNOSIS — Z880 Allergy status to penicillin: Secondary | ICD-10-CM | POA: Insufficient documentation

## 2015-10-22 DIAGNOSIS — M549 Dorsalgia, unspecified: Secondary | ICD-10-CM | POA: Diagnosis not present

## 2015-10-22 DIAGNOSIS — O99341 Other mental disorders complicating pregnancy, first trimester: Secondary | ICD-10-CM | POA: Insufficient documentation

## 2015-10-22 DIAGNOSIS — R51 Headache: Secondary | ICD-10-CM | POA: Diagnosis present

## 2015-10-22 DIAGNOSIS — F319 Bipolar disorder, unspecified: Secondary | ICD-10-CM | POA: Diagnosis not present

## 2015-10-22 DIAGNOSIS — O26891 Other specified pregnancy related conditions, first trimester: Secondary | ICD-10-CM | POA: Diagnosis not present

## 2015-10-22 LAB — POCT PREGNANCY, URINE: PREG TEST UR: POSITIVE — AB

## 2015-10-22 LAB — URINALYSIS, ROUTINE W REFLEX MICROSCOPIC
BILIRUBIN URINE: NEGATIVE
Glucose, UA: NEGATIVE mg/dL
Hgb urine dipstick: NEGATIVE
KETONES UR: NEGATIVE mg/dL
LEUKOCYTES UA: NEGATIVE
NITRITE: NEGATIVE
PH: 5.5 (ref 5.0–8.0)
Protein, ur: NEGATIVE mg/dL
SPECIFIC GRAVITY, URINE: 1.02 (ref 1.005–1.030)

## 2015-10-22 MED ORDER — BUTALBITAL-APAP-CAFFEINE 50-325-40 MG PO TABS
2.0000 | ORAL_TABLET | Freq: Once | ORAL | Status: AC
Start: 2015-10-22 — End: 2015-10-22
  Administered 2015-10-22: 2 via ORAL
  Filled 2015-10-22: qty 2

## 2015-10-22 MED ORDER — BUTALBITAL-APAP-CAFFEINE 50-325-40 MG PO TABS: 1.0000 | ORAL_TABLET | Freq: Four times a day (QID) | ORAL | 0 refills | Status: DC | PRN

## 2015-10-22 NOTE — MAU Note (Signed)
Patient presents with 2 +HPT's and c/o headache since this morning unrelieved by tylenol and back pain X 2 days. Denies bleeding or discharge.

## 2015-10-22 NOTE — MAU Provider Note (Signed)
History     CSN: [id number]  Arrival date and time: 10/22/15 9518   First Provider Initiated Contact with Patient 10/22/15 1950      Chief Complaint  Patient presents with  . Headache  . Back Pain   Sharon Townsend is a 25 y.o. [phone] at [redacted]w[redacted]d who presents today with a headache, and backache. She states that she always gets headaches like this during pregnancy, and that was why she took a test at home. She denies any abdominal pain or vaginal bleeding.    Headache   This is a new problem. The current episode started yesterday. The problem occurs constantly. The problem has been unchanged. The pain is located in the frontal region. The pain does not radiate. The pain quality is similar to prior headaches (Patient states that she "always" has headaches like this during pregnancy ). The pain is at a severity of 8/10. Associated symptoms include back pain. Pertinent negatives include no abdominal pain, fever, nausea or vomiting. Nothing aggravates the symptoms. She has tried acetaminophen for the symptoms. The treatment provided mild relief.  Back Pain  This is a new problem. The current episode started yesterday. The problem occurs constantly. The problem is unchanged. The pain is present in the lumbar spine. The quality of the pain is described as aching. The pain is at a severity of 8/10. Associated symptoms include headaches. Pertinent negatives include no abdominal pain, dysuria or fever. She has tried nothing for the symptoms.    Past Medical History:  Diagnosis Date  . Bipolar disorder ([facility])   . Chronic constipation   . Gall bladder stones   . Renal disorder    two tubes in left kidney going to bladder    Past Surgical History:  Procedure Laterality Date  . Childbirth  08-03-14  . CHOLECYSTECTOMY    . MANDIBLE SURGERY    . renal stents    . TONSILLECTOMY      Family History  Problem Relation Age of Onset  . Diabetes Mother   . Diabetes Father   . Hypertension Father      Social History  Substance Use Topics  . Smoking status: Never Smoker  . Smokeless tobacco: Never Used  . Alcohol use No     Comment: before preg    Allergies:  Allergies  Allergen Reactions  . Peanuts [Peanut Oil] Anaphylaxis  . Latex Swelling  . Penicillins Other (See Comments)    Reaction:  Pt states that it "shuts down her bowels and cannot urinate"    Prescriptions Prior to Admission  Medication Sig Dispense Refill Last Dose  . cetirizine (ZYRTEC) 10 MG tablet Take 10 mg by mouth daily as needed for allergies.   Past Week at Unknown time  . Iron-FA-B Cmp-C-Biot-Probiotic (FUSION PLUS) CAPS Take 1 capsule by mouth daily before breakfast. 30 capsule 5 10/21/2015 at Unknown time  . naproxen (NAPROSYN) 500 MG tablet Take 1 tablet (500 mg total) by mouth 2 (two) times daily. (Patient not taking: Reported on 10/22/2015) 30 tablet 0 Not Taking at Unknown time  . traMADol (ULTRAM) 50 MG tablet Take 1 tablet (50 mg total) by mouth every 6 (six) hours as needed. (Patient not taking: Reported on 10/22/2015) 10 tablet 0 Not Taking at Unknown time    Review of Systems  Constitutional: Negative for chills and fever.  Gastrointestinal: Negative for abdominal pain, constipation, diarrhea, nausea and vomiting.  Genitourinary: Negative for dysuria, frequency and urgency.  Musculoskeletal: Positive for back pain.  Neurological: Positive for headaches.   Physical Exam   Blood pressure 120/87, pulse 89, temperature 98.8 F (37.1 C), temperature source Oral, resp. rate 18, height 5' (1.524 m), weight 165 lb (74.8 kg), last menstrual period 09/24/2015, currently breastfeeding.  Physical Exam  Nursing note and vitals reviewed. Constitutional: She is oriented to person, place, and time. She appears well-developed and well-nourished. No distress.  HENT:  Head: Normocephalic.  Cardiovascular: Normal rate.   Respiratory: Effort normal.  GI: Soft. There is no tenderness. There is no rebound.   Neurological: She is alert and oriented to person, place, and time.  Skin: Skin is warm and dry.  Psychiatric: She has a normal mood and affect.   Results for orders placed or performed during the hospital encounter of 10/22/15 (from the past 24 hour(s))  Urinalysis, Routine w reflex microscopic (not at [facility])     Status: None   Collection Time: 10/22/15  7:05 PM  Result Value Ref Range   Color, Urine YELLOW YELLOW   APPearance CLEAR CLEAR   Specific Gravity, Urine 1.020 1.005 - 1.030   pH 5.5 5.0 - 8.0   Glucose, UA NEGATIVE NEGATIVE mg/dL   Hgb urine dipstick NEGATIVE NEGATIVE   Bilirubin Urine NEGATIVE NEGATIVE   Ketones, ur NEGATIVE NEGATIVE mg/dL   Protein, ur NEGATIVE NEGATIVE mg/dL   Nitrite NEGATIVE NEGATIVE   Leukocytes, UA NEGATIVE NEGATIVE  Pregnancy, urine POC     Status: Abnormal   Collection Time: 10/22/15  7:16 PM  Result Value Ref Range   Preg Test, Ur POSITIVE (A) NEGATIVE    MAU Course  Procedures  MDM Patient has had fioricet. She reports that her pain has improved.   Assessment and Plan   1. Headache in pregnancy, antepartum, first trimester    DC home Comfort measures reviewed  1st Trimester precautions  RX: fioricet PRN #20  Return to MAU as needed FU with OB as planned  Follow-up Information    HARPER,CHARLES A, MD. Schedule an appointment as soon as possible for a visit today.   Specialty:  Obstetrics and Gynecology Contact information: [address] [place] [address] [phone]            Tawnya Crook 10/22/2015, 7:59 PM

## 2015-10-22 NOTE — Discharge Instructions (Signed)
[facility]    [facility]  [phone]     Phone: [phone]          [facility]                      [facility]  @[place]     Phone: [phone]  Phone: [phone]         [phone] [facility]     Phone: [phone]  Phone: [phone]           [facility] [place]                hone: [phone]  Phone: [phone]                                                                                                         [facility]                                                                                                    Phone: [phone]          [facility] Dept.                Phone: [phone]  [facility]   [address] [place])          Phone: [phone] [facility]   Phone: [phone]

## 2015-10-31 ENCOUNTER — Inpatient Hospital Stay (HOSPITAL_COMMUNITY): Payer: Medicaid Other

## 2015-10-31 ENCOUNTER — Inpatient Hospital Stay (HOSPITAL_COMMUNITY)
Admission: AD | Admit: 2015-10-31 | Discharge: 2015-11-01 | Disposition: A | Discharge status: Home / Self Care | Payer: Medicaid Other | Source: Ambulatory Visit | Attending: Family Medicine | Admitting: Family Medicine

## 2015-10-31 ENCOUNTER — Encounter (HOSPITAL_COMMUNITY): Payer: Self-pay | Admitting: *Deleted

## 2015-10-31 DIAGNOSIS — F319 Bipolar disorder, unspecified: Secondary | ICD-10-CM | POA: Insufficient documentation

## 2015-10-31 DIAGNOSIS — R102 Pelvic and perineal pain: Secondary | ICD-10-CM | POA: Diagnosis not present

## 2015-10-31 DIAGNOSIS — O99341 Other mental disorders complicating pregnancy, first trimester: Secondary | ICD-10-CM | POA: Diagnosis not present

## 2015-10-31 DIAGNOSIS — Z880 Allergy status to penicillin: Secondary | ICD-10-CM | POA: Diagnosis not present

## 2015-10-31 DIAGNOSIS — O26891 Other specified pregnancy related conditions, first trimester: Secondary | ICD-10-CM | POA: Diagnosis present

## 2015-10-31 DIAGNOSIS — Z3A01 Less than 8 weeks gestation of pregnancy: Secondary | ICD-10-CM | POA: Insufficient documentation

## 2015-10-31 DIAGNOSIS — Z91040 Latex allergy status: Secondary | ICD-10-CM | POA: Diagnosis not present

## 2015-10-31 DIAGNOSIS — O26899 Other specified pregnancy related conditions, unspecified trimester: Secondary | ICD-10-CM

## 2015-10-31 DIAGNOSIS — Z91010 Allergy to peanuts: Secondary | ICD-10-CM | POA: Diagnosis not present

## 2015-10-31 DIAGNOSIS — R109 Unspecified abdominal pain: Secondary | ICD-10-CM

## 2015-10-31 LAB — CBC
HEMATOCRIT: 36.5 % (ref 36.0–46.0)
HEMOGLOBIN: 12.7 g/dL (ref 12.0–15.0)
MCH: 30.1 pg (ref 26.0–34.0)
MCHC: 34.8 g/dL (ref 30.0–36.0)
MCV: 86.5 fL (ref 78.0–100.0)
Platelets: 233 10*3/uL (ref 150–400)
RBC: 4.22 MIL/uL (ref 3.87–5.11)
RDW: 12.1 % (ref 11.5–15.5)
WBC: 10.2 10*3/uL (ref 4.0–10.5)

## 2015-10-31 LAB — URINALYSIS, ROUTINE W REFLEX MICROSCOPIC
BILIRUBIN URINE: NEGATIVE
GLUCOSE, UA: NEGATIVE mg/dL
HGB URINE DIPSTICK: NEGATIVE
KETONES UR: NEGATIVE mg/dL
Leukocytes, UA: NEGATIVE
Nitrite: NEGATIVE
PH: 7 (ref 5.0–8.0)
Protein, ur: NEGATIVE mg/dL
SPECIFIC GRAVITY, URINE: 1.015 (ref 1.005–1.030)
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
## 2015-11-01 DIAGNOSIS — R102 Pelvic and perineal pain: Secondary | ICD-10-CM | POA: Diagnosis not present

## 2015-11-01 DIAGNOSIS — O26891 Other specified pregnancy related conditions, first trimester: Secondary | ICD-10-CM | POA: Diagnosis not present

## 2015-11-01 LAB — HCG, QUANTITATIVE, PREGNANCY: HCG, BETA CHAIN, QUANT, S: 2978 m[IU]/mL — AB (ref <5)

## 2015-11-01 NOTE — Discharge Instructions (Signed)
First Trimester of Pregnancy The first trimester of pregnancy is from week 1 until the end of week 12 (months 1 through 3). A week after a sperm fertilizes an egg, the egg will implant on the wall of the uterus. This embryo will begin to develop into a baby. Genes from you and your partner are forming the baby. The female genes determine whether the baby is a boy or a girl. At 6-8 weeks, the eyes and face are formed, and the heartbeat can be seen on ultrasound. At the end of 12 weeks, all the baby's organs are formed.  Now that you are pregnant, you will want to do everything you can to have a healthy baby. Two of the most important things are to get good prenatal care and to follow your health care provider's instructions. Prenatal care is all the medical care you receive before the baby's birth. This care will help prevent, find, and treat any problems during the pregnancy and childbirth. BODY CHANGES Your body goes through many changes during pregnancy. The changes vary from woman to woman.   You may gain or lose a couple of pounds at first.  You may feel sick to your stomach (nauseous) and throw up (vomit). If the vomiting is uncontrollable, call your health care provider.  You may tire easily.  You may develop headaches that can be relieved by medicines approved by your health care provider.  You may urinate more often. Painful urination may mean you have a bladder infection.  You may develop heartburn as a result of your pregnancy.  You may develop constipation because certain hormones are causing the muscles that push waste through your intestines to slow down.  You may develop hemorrhoids or swollen, bulging veins (varicose veins).  Your breasts may begin to grow larger and become tender. Your nipples may stick out more, and the tissue that surrounds them (areola) may become darker.  Your gums may bleed and may be sensitive to brushing and flossing.  Dark spots or blotches (chloasma,  mask of pregnancy) may develop on your face. This will likely fade after the baby is born.  Your menstrual periods will stop.  You may have a loss of appetite.  You may develop cravings for certain kinds of food.  You may have changes in your emotions from day to day, such as being excited to be pregnant or being concerned that something may go wrong with the pregnancy and baby.  You may have more vivid and strange dreams.  You may have changes in your hair. These can include thickening of your hair, rapid growth, and changes in texture. Some women also have hair loss during or after pregnancy, or hair that feels dry or thin. Your hair will most likely return to normal after your baby is born. WHAT TO EXPECT AT YOUR PRENATAL VISITS During a routine prenatal visit:  You will be weighed to make sure you and the baby are growing normally.  Your blood pressure will be taken.  Your abdomen will be measured to track your baby's growth.  The fetal heartbeat will be listened to starting around week 10 or 12 of your pregnancy.  Test results from any previous visits will be discussed. Your health care provider may ask you:  How you are feeling.  If you are feeling the baby move.  If you have had any abnormal symptoms, such as leaking fluid, bleeding, severe headaches, or abdominal cramping.  If you are using any tobacco products,   including cigarettes, chewing tobacco, and electronic cigarettes.  If you have any questions. Other tests that may be performed during your first trimester include:  Blood tests to find your blood type and to check for the presence of any previous infections. They will also be used to check for low iron levels (anemia) and Rh antibodies. Later in the pregnancy, blood tests for diabetes will be done along with other tests if problems develop.  Urine tests to check for infections, diabetes, or protein in the urine.  An ultrasound to confirm the proper growth  and development of the baby.  An amniocentesis to check for possible genetic problems.  Fetal screens for spina bifida and Down syndrome.  You may need other tests to make sure you and the baby are doing well.  HIV (human immunodeficiency virus) testing. Routine prenatal testing includes screening for HIV, unless you choose not to have this test. HOME CARE INSTRUCTIONS  Medicines  Follow your health care provider's instructions regarding medicine use. Specific medicines may be either safe or unsafe to take during pregnancy.  Take your prenatal vitamins as directed.  If you develop constipation, try taking a stool softener if your health care provider approves. Diet  Eat regular, well-balanced meals. Choose a variety of foods, such as meat or vegetable-based protein, fish, milk and low-fat dairy products, vegetables, fruits, and whole grain breads and cereals. Your health care provider will help you determine the amount of weight gain that is right for you.  Avoid raw meat and uncooked cheese. These carry germs that can cause birth defects in the baby.  Eating four or five small meals rather than three large meals a day may help relieve nausea and vomiting. If you start to feel nauseous, eating a few soda crackers can be helpful. Drinking liquids between meals instead of during meals also seems to help nausea and vomiting.  If you develop constipation, eat more high-fiber foods, such as fresh vegetables or fruit and whole grains. Drink enough fluids to keep your urine clear or pale yellow. Activity and Exercise  Exercise only as directed by your health care provider. Exercising will help you:  Control your weight.  Stay in shape.  Be prepared for labor and delivery.  Experiencing pain or cramping in the lower abdomen or low back is a good sign that you should stop exercising. Check with your health care provider before continuing normal exercises.  Try to avoid standing for long  periods of time. Move your legs often if you must stand in one place for a long time.  Avoid heavy lifting.  Wear low-heeled shoes, and practice good posture.  You may continue to have sex unless your health care provider directs you otherwise. Relief of Pain or Discomfort  Wear a good support bra for breast tenderness.   Take warm sitz baths to soothe any pain or discomfort caused by hemorrhoids. Use hemorrhoid cream if your health care provider approves.   Rest with your legs elevated if you have leg cramps or low back pain.  If you develop varicose veins in your legs, wear support hose. Elevate your feet for 15 minutes, 3-4 times a day. Limit salt in your diet. Prenatal Care  Schedule your prenatal visits by the twelfth week of pregnancy. They are usually scheduled monthly at first, then more often in the last 2 months before delivery.  Write down your questions. Take them to your prenatal visits.  Keep all your prenatal visits as directed by your   health care provider. Safety  Wear your seat belt at all times when driving.  Make a list of emergency phone numbers, including numbers for family, friends, the hospital, and police and fire departments. General Tips  Ask your health care provider for a referral to a local prenatal education class. Begin classes no later than at the beginning of month 6 of your pregnancy.  Ask for help if you have counseling or nutritional needs during pregnancy. Your health care provider can offer advice or refer you to specialists for help with various needs.  Do not use hot tubs, steam rooms, or saunas.  Do not douche or use tampons or scented sanitary pads.  Do not cross your legs for long periods of time.  Avoid cat litter boxes and soil used by cats. These carry germs that can cause birth defects in the baby and possibly loss of the fetus by miscarriage or stillbirth.  Avoid all smoking, herbs, alcohol, and medicines not prescribed by  your health care provider. Chemicals in these affect the formation and growth of the baby.  Do not use any tobacco products, including cigarettes, chewing tobacco, and electronic cigarettes. If you need help quitting, ask your health care provider. You may receive counseling support and other resources to help you quit.  Schedule a dentist appointment. At home, brush your teeth with a soft toothbrush and be gentle when you floss. SEEK MEDICAL CARE IF:   You have dizziness.  You have mild pelvic cramps, pelvic pressure, or nagging pain in the abdominal area.  You have persistent nausea, vomiting, or diarrhea.  You have a bad smelling vaginal discharge.  You have pain with urination.  You notice increased swelling in your face, hands, legs, or ankles. SEEK IMMEDIATE MEDICAL CARE IF:   You have a fever.  You are leaking fluid from your vagina.  You have spotting or bleeding from your vagina.  You have severe abdominal cramping or pain.  You have rapid weight gain or loss.  You vomit blood or material that looks like coffee grounds.  You are exposed to German measles and have never had them.  You are exposed to fifth disease or chickenpox.  You develop a severe headache.  You have shortness of breath.  You have any kind of trauma, such as from a fall or a car accident.   This information is not intended to replace advice given to you by your health care provider. Make sure you discuss any questions you have with your health care provider.   Document Released: 03/10/2001 Document Revised: 04/06/2014 Document Reviewed: 01/24/2013 Elsevier Interactive Patient Education 2016 [employer]  

## 2015-11-12 ENCOUNTER — Encounter (HOSPITAL_COMMUNITY): Payer: Self-pay | Admitting: *Deleted

## 2015-11-12 ENCOUNTER — Inpatient Hospital Stay (HOSPITAL_COMMUNITY)
Admission: AD | Admit: 2015-11-12 | Discharge: 2015-11-12 | Disposition: A | Discharge status: Home / Self Care | Payer: Medicaid Other | Source: Ambulatory Visit | Attending: Family Medicine | Admitting: Family Medicine

## 2015-11-12 DIAGNOSIS — Z833 Family history of diabetes mellitus: Secondary | ICD-10-CM | POA: Diagnosis not present

## 2015-11-12 DIAGNOSIS — N930 Postcoital and contact bleeding: Secondary | ICD-10-CM | POA: Insufficient documentation

## 2015-11-12 DIAGNOSIS — R102 Pelvic and perineal pain: Secondary | ICD-10-CM | POA: Diagnosis not present

## 2015-11-12 DIAGNOSIS — O219 Vomiting of pregnancy, unspecified: Secondary | ICD-10-CM | POA: Diagnosis not present

## 2015-11-12 DIAGNOSIS — Z880 Allergy status to penicillin: Secondary | ICD-10-CM | POA: Insufficient documentation

## 2015-11-12 DIAGNOSIS — O209 Hemorrhage in early pregnancy, unspecified: Secondary | ICD-10-CM | POA: Diagnosis not present

## 2015-11-12 DIAGNOSIS — Z9049 Acquired absence of other specified parts of digestive tract: Secondary | ICD-10-CM | POA: Insufficient documentation

## 2015-11-12 DIAGNOSIS — Z91040 Latex allergy status: Secondary | ICD-10-CM | POA: Diagnosis not present

## 2015-11-12 DIAGNOSIS — F319 Bipolar disorder, unspecified: Secondary | ICD-10-CM | POA: Diagnosis not present

## 2015-11-12 DIAGNOSIS — Z8249 Family history of ischemic heart disease and other diseases of the circulatory system: Secondary | ICD-10-CM | POA: Insufficient documentation

## 2015-11-12 DIAGNOSIS — Z3A01 Less than 8 weeks gestation of pregnancy: Secondary | ICD-10-CM | POA: Insufficient documentation

## 2015-11-12 DIAGNOSIS — N2889 Other specified disorders of kidney and ureter: Secondary | ICD-10-CM | POA: Insufficient documentation

## 2015-11-12 DIAGNOSIS — K5909 Other constipation: Secondary | ICD-10-CM | POA: Insufficient documentation

## 2015-11-12 DIAGNOSIS — Z91010 Allergy to peanuts: Secondary | ICD-10-CM | POA: Insufficient documentation

## 2015-11-12 DIAGNOSIS — Z8719 Personal history of other diseases of the digestive system: Secondary | ICD-10-CM | POA: Diagnosis not present

## 2015-11-12 LAB — CBC
HCT: 35.1 % — ABNORMAL LOW (ref 36.0–46.0)
Hemoglobin: 12.5 g/dL (ref 12.0–15.0)
MCH: 30 pg (ref 26.0–34.0)
MCHC: 35.6 g/dL (ref 30.0–36.0)
MCV: 84.4 fL (ref 78.0–100.0)
PLATELETS: 248 10*3/uL (ref 150–400)
RBC: 4.16 MIL/uL (ref 3.87–5.11)
RDW: 12 % (ref 11.5–15.5)
WBC: 10.4 10*3/uL (ref 4.0–10.5)

## 2015-11-12 LAB — URINALYSIS, ROUTINE W REFLEX MICROSCOPIC
Bilirubin Urine: NEGATIVE
GLUCOSE, UA: 100 mg/dL — AB
HGB URINE DIPSTICK: NEGATIVE
KETONES UR: NEGATIVE mg/dL
LEUKOCYTES UA: NEGATIVE
Nitrite: NEGATIVE
PROTEIN: NEGATIVE mg/dL
Specific Gravity, Urine: 1.01 (ref 1.005–1.030)
pH: 5.5 (ref 5.0–8.0)

## 2015-11-12 LAB — OB RESULTS CONSOLE ABO/RH: RH Type: NEGATIVE

## 2015-11-12 LAB — OB RESULTS CONSOLE RUBELLA ANTIBODY, IGM: Rubella: IMMUNE

## 2015-11-12 LAB — OB RESULTS CONSOLE HIV ANTIBODY (ROUTINE TESTING): HIV: NONREACTIVE

## 2015-11-12 LAB — OB RESULTS CONSOLE HEPATITIS B SURFACE ANTIGEN: Hepatitis B Surface Ag: NEGATIVE

## 2015-11-12 LAB — OB RESULTS CONSOLE RPR: RPR: NONREACTIVE

## 2015-11-12 LAB — OB RESULTS CONSOLE ANTIBODY SCREEN: ANTIBODY SCREEN: NEGATIVE

## 2015-11-12 MED ORDER — PROMETHAZINE HCL 25 MG PO TABS: 12.5000 mg | ORAL_TABLET | Freq: Four times a day (QID) | ORAL | 0 refills | Status: DC | PRN

## 2015-11-12 NOTE — MAU Provider Note (Signed)
History     CSN: [id number]  Arrival date and time: 11/12/15 2114   First Provider Initiated Contact with Patient 11/12/15 2149      Chief Complaint  Patient presents with  . Abdominal Pain  . Vaginal Bleeding   Vaginal Bleeding  The patient's primary symptoms include pelvic pain and vaginal discharge. This is a new problem. The current episode started today. The problem occurs constantly. The problem has been unchanged. The pain is severe. The problem affects both sides. She is pregnant. Associated symptoms include abdominal pain and nausea. Pertinent negatives include no chills, constipation, diarrhea, dysuria, fever, frequency, urgency or vomiting. The vaginal bleeding is spotting. She has not been passing clots. She has not been passing tissue. The symptoms are aggravated by intercourse. She has tried nothing for the symptoms. She is sexually active.     Past Medical History:  Diagnosis Date  . Bipolar disorder (HCC)   . Chronic constipation   . Gall bladder stones   . Renal disorder    two tubes in left kidney going to bladder    Past Surgical History:  Procedure Laterality Date  . Childbirth  08-03-14  . CHOLECYSTECTOMY    . MANDIBLE SURGERY    . renal stents    . TONSILLECTOMY      Family History  Problem Relation Age of Onset  . Diabetes Mother   . Diabetes Father   . Hypertension Father     Social History  Substance Use Topics  . Smoking status: Never Smoker  . Smokeless tobacco: Never Used  . Alcohol use No     Comment: before preg    Allergies:  Allergies  Allergen Reactions  . Peanuts [Peanut Oil] Anaphylaxis  . Latex Swelling  . Penicillins Other (See Comments)    Reaction:  Pt states that it "shuts down her bowels and cannot urinate"    Prescriptions Prior to Admission  Medication Sig Dispense Refill Last Dose  . butalbital-acetaminophen-caffeine (FIORICET) 50-325-40 MG tablet Take 1-2 tablets by mouth every 6 (six) hours as needed for  headache. 20 tablet 0   . cetirizine (ZYRTEC) 10 MG tablet Take 10 mg by mouth daily as needed for allergies.   Past Week at Unknown time  . Iron-FA-B Cmp-C-Biot-Probiotic (FUSION PLUS) CAPS Take 1 capsule by mouth daily before breakfast. 30 capsule 5 10/21/2015 at Unknown time    Review of Systems  Constitutional: Negative for chills and fever.  Gastrointestinal: Positive for abdominal pain and nausea. Negative for constipation, diarrhea and vomiting.  Genitourinary: Positive for pelvic pain, vaginal bleeding and vaginal discharge. Negative for dysuria, frequency and urgency.   Physical Exam   Blood pressure 125/75, pulse 79, temperature 98 F (36.7 C), temperature source Oral, resp. rate 16, last menstrual period 09/24/2015, currently breastfeeding.  Physical Exam  Nursing note and vitals reviewed. Constitutional: She is oriented to person, place, and time. She appears well-developed and well-nourished. No distress.  HENT:  Head: Normocephalic.  Cardiovascular: Normal rate.   Respiratory: Effort normal.  GI: Soft. There is no tenderness. There is no rebound.  Genitourinary:  Genitourinary Comments:  External: no lesion Vagina: small amount of white discharge Cervix: pink, smooth, no CMT Uterus: NSSC Adnexa: NT   Neurological: She is alert and oriented to person, place, and time.  Skin: Skin is warm and dry.  Psychiatric: She has a normal mood and affect.  Bedside US: +cardiac activity, 5946w5d  MAU Course  Procedures  MDM   Assessment and Plan  1. Postcoital bleeding   2. Vaginal bleeding in pregnancy, first trimester   3. [redacted] weeks gestation of pregnancy   4. Nausea/vomiting in pregnancy    DC home Comfort measures reviewed  1st Trimester precautions  Bleeding precautions Pelvic rest  RX: phenergan PRN #[id number]  Return to MAU as needed FU with OB as planned  Follow-up Information    [facility] .   Contact information: [address] [place] [id number] [phone]            Tawnya CrookHogan, Gevorg Brum Donovan 11/12/2015, 9:50 PM

## 2015-11-12 NOTE — MAU Note (Signed)
Pt presents complaining of lower abdominal pain and vaginal bleeding that started this morning. States it was heavy at first but has been on and off throughout the day. Last intercourse last night. Denies abnormal discharge. Has not tried anything for the pain.

## 2015-11-12 NOTE — Discharge Instructions (Signed)
First Trimester of Pregnancy The first trimester of pregnancy is from week 1 until the end of week 12 (months 1 through 3). A week after a sperm fertilizes an egg, the egg will implant on the wall of the uterus. This embryo will begin to develop into a baby. Genes from you and your partner are forming the baby. The female genes determine whether the baby is a boy or a girl. At 6-8 weeks, the eyes and face are formed, and the heartbeat can be seen on ultrasound. At the end of 12 weeks, all the baby's organs are formed.  Now that you are pregnant, you will want to do everything you can to have a healthy baby. Two of the most important things are to get good prenatal care and to follow your health care provider's instructions. Prenatal care is all the medical care you receive before the baby's birth. This care will help prevent, find, and treat any problems during the pregnancy and childbirth. BODY CHANGES Your body goes through many changes during pregnancy. The changes vary from woman to woman.   You may gain or lose a couple of pounds at first.  You may feel sick to your stomach (nauseous) and throw up (vomit). If the vomiting is uncontrollable, call your health care provider.  You may tire easily.  You may develop headaches that can be relieved by medicines approved by your health care provider.  You may urinate more often. Painful urination may mean you have a bladder infection.  You may develop heartburn as a result of your pregnancy.  You may develop constipation because certain hormones are causing the muscles that push waste through your intestines to slow down.  You may develop hemorrhoids or swollen, bulging veins (varicose veins).  Your breasts may begin to grow larger and become tender. Your nipples may stick out more, and the tissue that surrounds them (areola) may become darker.  Your gums may bleed and may be sensitive to brushing and flossing.  Dark spots or blotches (chloasma,  mask of pregnancy) may develop on your face. This will likely fade after the baby is born.  Your menstrual periods will stop.  You may have a loss of appetite.  You may develop cravings for certain kinds of food.  You may have changes in your emotions from day to day, such as being excited to be pregnant or being concerned that something may go wrong with the pregnancy and baby.  You may have more vivid and strange dreams.  You may have changes in your hair. These can include thickening of your hair, rapid growth, and changes in texture. Some women also have hair loss during or after pregnancy, or hair that feels dry or thin. Your hair will most likely return to normal after your baby is born. WHAT TO EXPECT AT YOUR PRENATAL VISITS During a routine prenatal visit:  You will be weighed to make sure you and the baby are growing normally.  Your blood pressure will be taken.  Your abdomen will be measured to track your baby's growth.  The fetal heartbeat will be listened to starting around week 10 or 12 of your pregnancy.  Test results from any previous visits will be discussed. Your health care provider may ask you:  How you are feeling.  If you are feeling the baby move.  If you have had any abnormal symptoms, such as leaking fluid, bleeding, severe headaches, or abdominal cramping.  If you are using any tobacco products,   including cigarettes, chewing tobacco, and electronic cigarettes.  If you have any questions. Other tests that may be performed during your first trimester include:  Blood tests to find your blood type and to check for the presence of any previous infections. They will also be used to check for low iron levels (anemia) and Rh antibodies. Later in the pregnancy, blood tests for diabetes will be done along with other tests if problems develop.  Urine tests to check for infections, diabetes, or protein in the urine.  An ultrasound to confirm the proper growth  and development of the baby.  An amniocentesis to check for possible genetic problems.  Fetal screens for spina bifida and Down syndrome.  You may need other tests to make sure you and the baby are doing well.  HIV (human immunodeficiency virus) testing. Routine prenatal testing includes screening for HIV, unless you choose not to have this test. HOME CARE INSTRUCTIONS  Medicines  Follow your health care provider's instructions regarding medicine use. Specific medicines may be either safe or unsafe to take during pregnancy.  Take your prenatal vitamins as directed.  If you develop constipation, try taking a stool softener if your health care provider approves. Diet  Eat regular, well-balanced meals. Choose a variety of foods, such as meat or vegetable-based protein, fish, milk and low-fat dairy products, vegetables, fruits, and whole grain breads and cereals. Your health care provider will help you determine the amount of weight gain that is right for you.  Avoid raw meat and uncooked cheese. These carry germs that can cause birth defects in the baby.  Eating four or five small meals rather than three large meals a day may help relieve nausea and vomiting. If you start to feel nauseous, eating a few soda crackers can be helpful. Drinking liquids between meals instead of during meals also seems to help nausea and vomiting.  If you develop constipation, eat more high-fiber foods, such as fresh vegetables or fruit and whole grains. Drink enough fluids to keep your urine clear or pale yellow. Activity and Exercise  Exercise only as directed by your health care provider. Exercising will help you:  Control your weight.  Stay in shape.  Be prepared for labor and delivery.  Experiencing pain or cramping in the lower abdomen or low back is a good sign that you should stop exercising. Check with your health care provider before continuing normal exercises.  Try to avoid standing for long  periods of time. Move your legs often if you must stand in one place for a long time.  Avoid heavy lifting.  Wear low-heeled shoes, and practice good posture.  You may continue to have sex unless your health care provider directs you otherwise. Relief of Pain or Discomfort  Wear a good support bra for breast tenderness.   Take warm sitz baths to soothe any pain or discomfort caused by hemorrhoids. Use hemorrhoid cream if your health care provider approves.   Rest with your legs elevated if you have leg cramps or low back pain.  If you develop varicose veins in your legs, wear support hose. Elevate your feet for 15 minutes, 3-4 times a day. Limit salt in your diet. Prenatal Care  Schedule your prenatal visits by the twelfth week of pregnancy. They are usually scheduled monthly at first, then more often in the last 2 months before delivery.  Write down your questions. Take them to your prenatal visits.  Keep all your prenatal visits as directed by your   health care provider. Safety  Wear your seat belt at all times when driving.  Make a list of emergency phone numbers, including numbers for family, friends, the hospital, and police and fire departments. General Tips  Ask your health care provider for a referral to a local prenatal education class. Begin classes no later than at the beginning of month 6 of your pregnancy.  Ask for help if you have counseling or nutritional needs during pregnancy. Your health care provider can offer advice or refer you to specialists for help with various needs.  Do not use hot tubs, steam rooms, or saunas.  Do not douche or use tampons or scented sanitary pads.  Do not cross your legs for long periods of time.  Avoid cat litter boxes and soil used by cats. These carry germs that can cause birth defects in the baby and possibly loss of the fetus by miscarriage or stillbirth.  Avoid all smoking, herbs, alcohol, and medicines not prescribed by  your health care provider. Chemicals in these affect the formation and growth of the baby.  Do not use any tobacco products, including cigarettes, chewing tobacco, and electronic cigarettes. If you need help quitting, ask your health care provider. You may receive counseling support and other resources to help you quit.  Schedule a dentist appointment. At home, brush your teeth with a soft toothbrush and be gentle when you floss. SEEK MEDICAL CARE IF:   You have dizziness.  You have mild pelvic cramps, pelvic pressure, or nagging pain in the abdominal area.  You have persistent nausea, vomiting, or diarrhea.  You have a bad smelling vaginal discharge.  You have pain with urination.  You notice increased swelling in your face, hands, legs, or ankles. SEEK IMMEDIATE MEDICAL CARE IF:   You have a fever.  You are leaking fluid from your vagina.  You have spotting or bleeding from your vagina.  You have severe abdominal cramping or pain.  You have rapid weight gain or loss.  You vomit blood or material that looks like coffee grounds.  You are exposed to German measles and have never had them.  You are exposed to fifth disease or chickenpox.  You develop a severe headache.  You have shortness of breath.  You have any kind of trauma, such as from a fall or a car accident.   This information is not intended to replace advice given to you by your health care provider. Make sure you discuss any questions you have with your health care provider.   Document Released: 03/10/2001 Document Revised: 04/06/2014 Document Reviewed: 01/24/2013 Elsevier Interactive Patient Education 2016 [employer]  

## 2015-11-18 ENCOUNTER — Encounter: Payer: Self-pay | Admitting: Obstetrics

## 2015-11-18 DIAGNOSIS — Z3490 Encounter for supervision of normal pregnancy, unspecified, unspecified trimester: Secondary | ICD-10-CM | POA: Insufficient documentation

## 2015-12-20 IMAGING — US US ABDOMEN LIMITED
1 series · 14 of 25 positions shown · non-contrast
Comparison: None.

CLINICAL DATA: Right upper quadrant pain

EXAM:
US ABDOMEN LIMITED - RIGHT UPPER QUADRANT

[Series 1: us abdomen limited · 0.21mm/px · 14 of 50 slices shown]
[im 1/50]
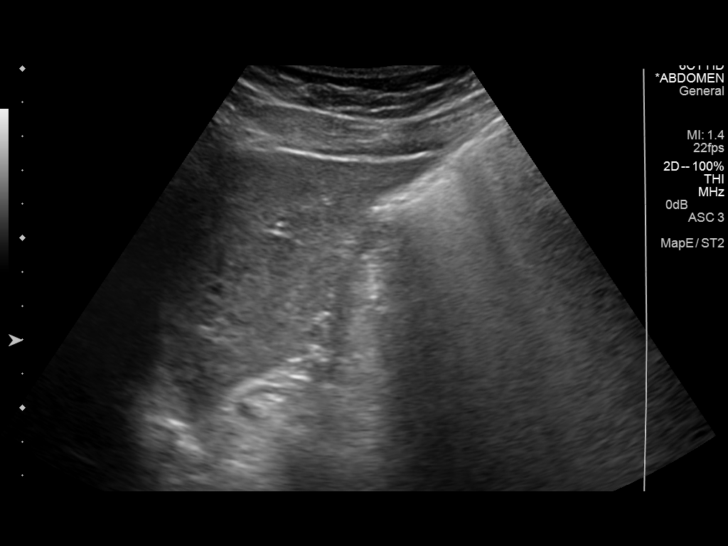
[im 5/50]
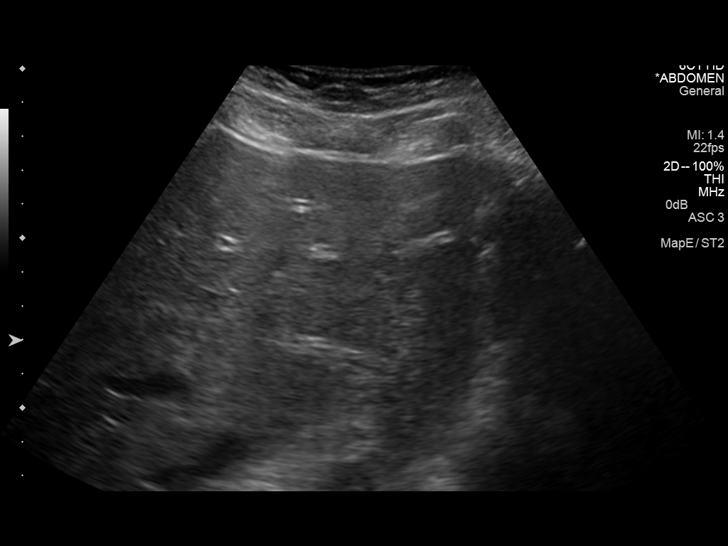
[im 9/50]
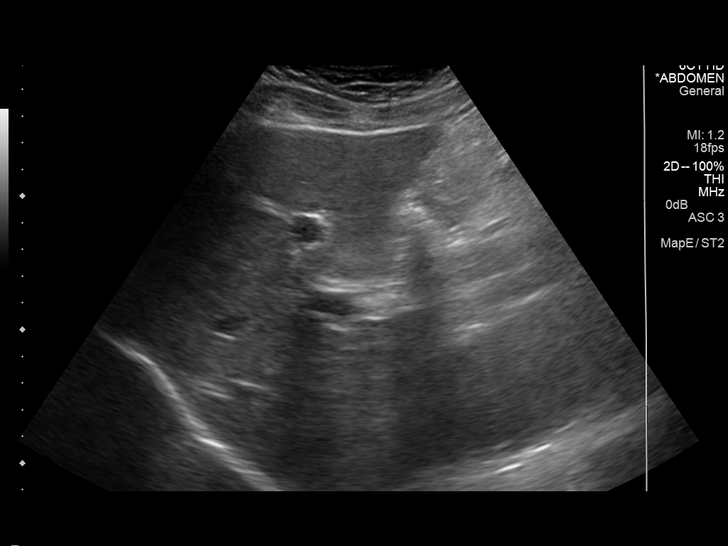
[im 13/50]
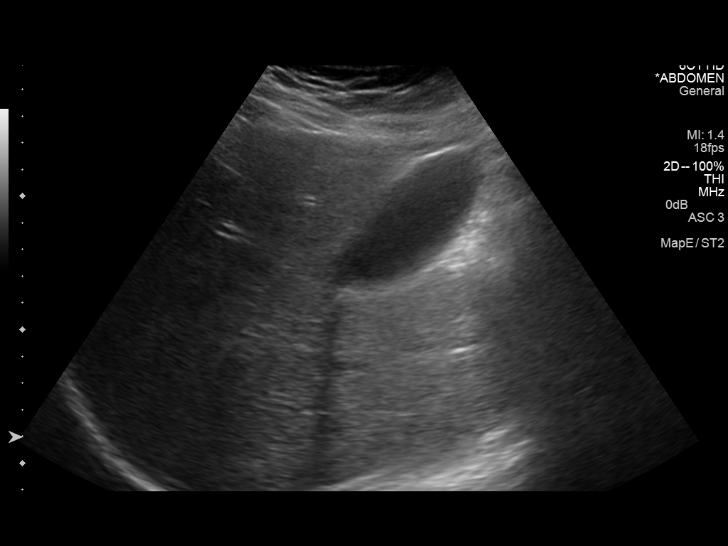
[im 17/50]
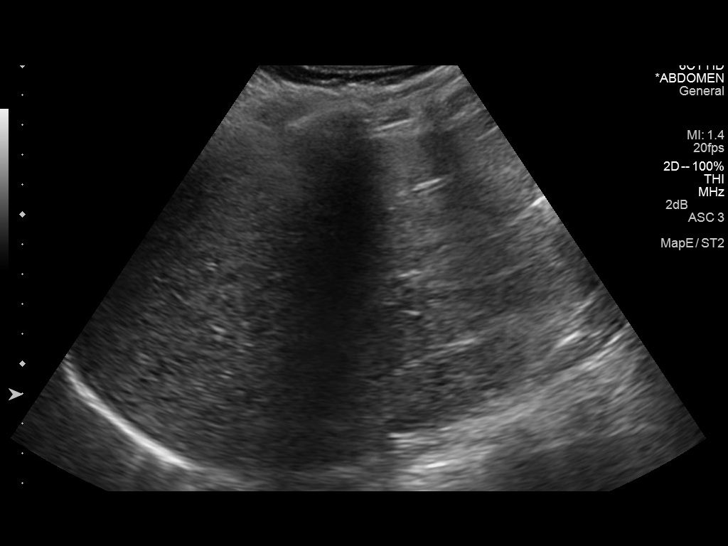
[im 19/50]
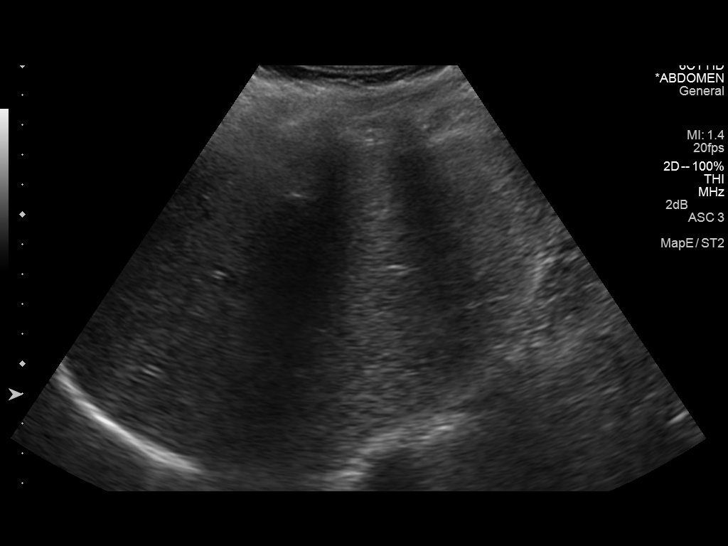
[im 23/50]
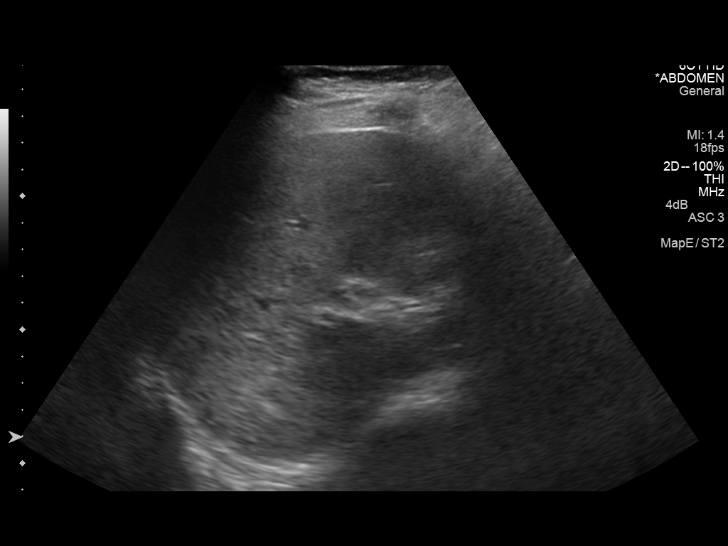
[im 27/50]
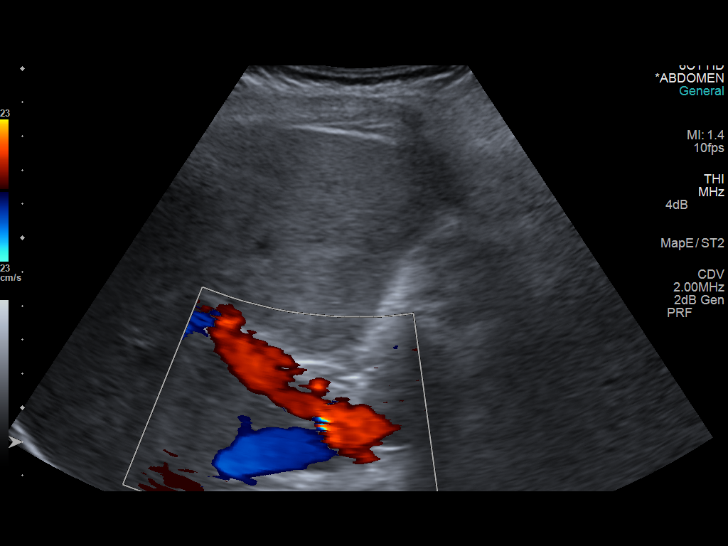
[im 31/50]
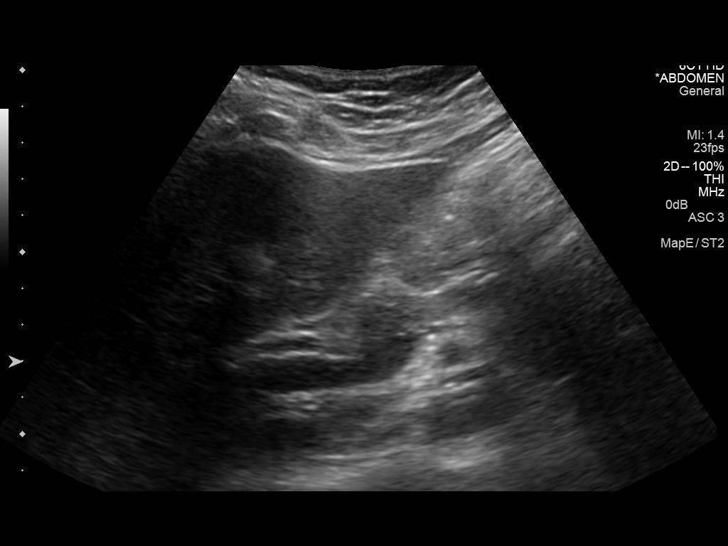
[im 33/50]
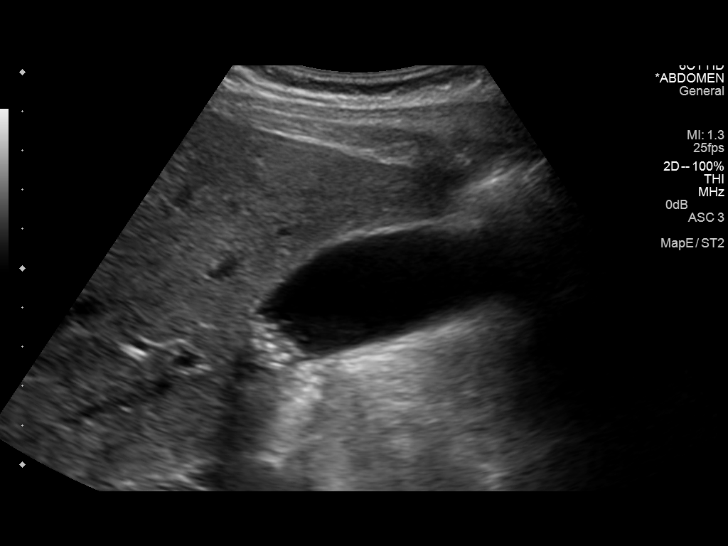
[im 37/50]
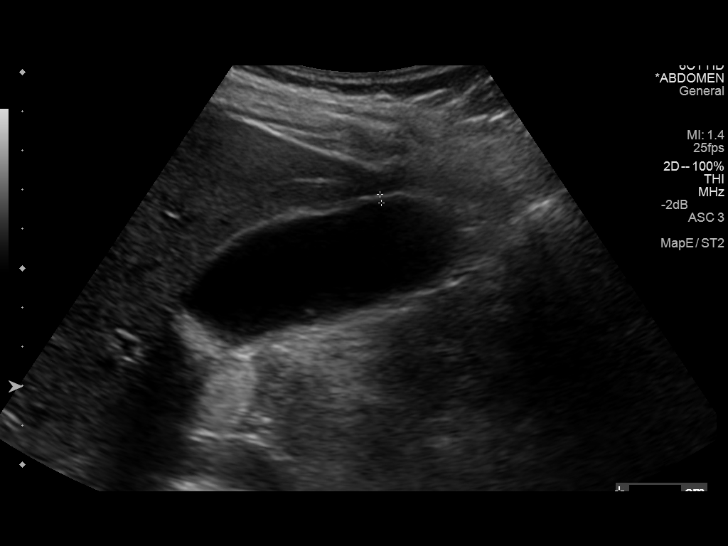
[im 41/50]
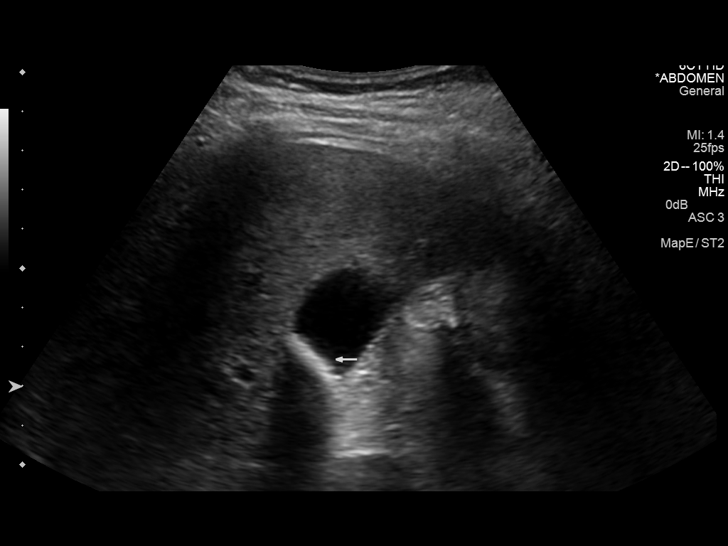
[im 45/50]
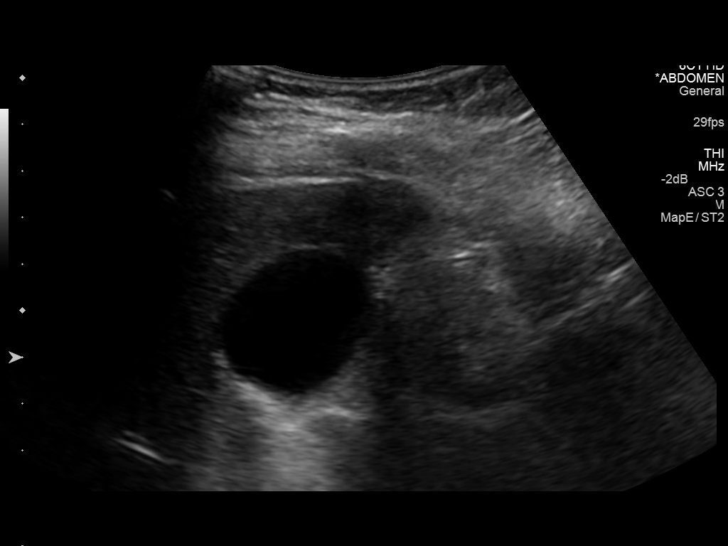
[im 50/50]
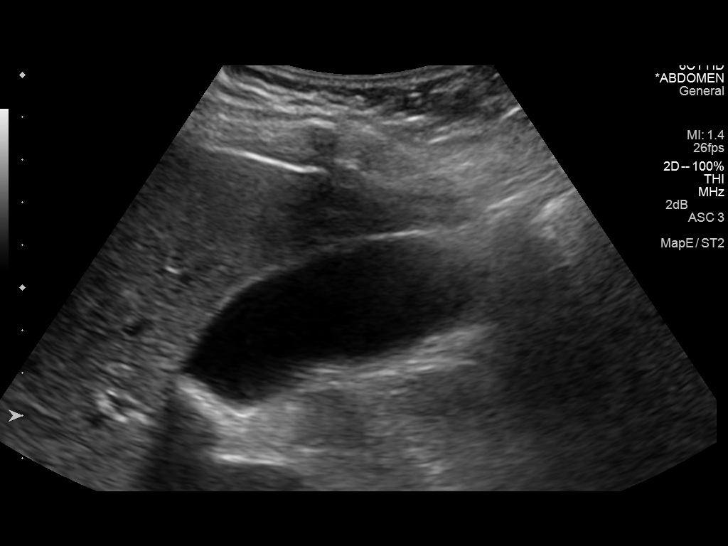

[14 of 25 positions shown; findings below may reference images not displayed]

FINDINGS: Gallbladder:

Numerous shadowing gallstones in the gallbladder neck, not clearly
mobile. No wall thickening or focal tenderness suggestive of acute
cholecystitis.

Common bile duct:

Diameter: Up to 5 mm.Where visualized, no filling defect.

Liver:

No focal lesion identified. Within normal limits in parenchymal
echogenicity. Antegrade flow in the imaged portal venous system.
IMPRESSION: Multiple small gallstones.  No indication of acute cholecystitis.

## 2015-12-27 LAB — PROCEDURE REPORT - SCANNED: Pap: NEGATIVE

## 2016-01-06 ENCOUNTER — Encounter (HOSPITAL_COMMUNITY): Payer: Self-pay

## 2016-01-06 ENCOUNTER — Inpatient Hospital Stay (HOSPITAL_COMMUNITY)
Admission: AD | Admit: 2016-01-06 | Discharge: 2016-01-06 | Disposition: A | Discharge status: Home / Self Care | Payer: Medicaid Other | Source: Ambulatory Visit | Attending: Obstetrics | Admitting: Obstetrics

## 2016-01-06 DIAGNOSIS — A09 Infectious gastroenteritis and colitis, unspecified: Secondary | ICD-10-CM

## 2016-01-06 DIAGNOSIS — O99611 Diseases of the digestive system complicating pregnancy, first trimester: Secondary | ICD-10-CM

## 2016-01-06 DIAGNOSIS — R519 Headache, unspecified: Secondary | ICD-10-CM

## 2016-01-06 DIAGNOSIS — O99612 Diseases of the digestive system complicating pregnancy, second trimester: Secondary | ICD-10-CM | POA: Insufficient documentation

## 2016-01-06 DIAGNOSIS — R51 Headache: Secondary | ICD-10-CM | POA: Insufficient documentation

## 2016-01-06 DIAGNOSIS — K529 Noninfective gastroenteritis and colitis, unspecified: Secondary | ICD-10-CM

## 2016-01-06 DIAGNOSIS — O26892 Other specified pregnancy related conditions, second trimester: Secondary | ICD-10-CM

## 2016-01-06 DIAGNOSIS — Z880 Allergy status to penicillin: Secondary | ICD-10-CM | POA: Diagnosis not present

## 2016-01-06 DIAGNOSIS — Z3A14 14 weeks gestation of pregnancy: Secondary | ICD-10-CM | POA: Diagnosis not present

## 2016-01-06 DIAGNOSIS — Z3A11 11 weeks gestation of pregnancy: Secondary | ICD-10-CM

## 2016-01-06 LAB — COMPREHENSIVE METABOLIC PANEL
ALBUMIN: 3.7 g/dL (ref 3.5–5.0)
ALT: 14 U/L (ref 14–54)
ANION GAP: 7 (ref 5–15)
AST: 20 U/L (ref 15–41)
Alkaline Phosphatase: 61 U/L (ref 38–126)
BUN: 6 mg/dL (ref 6–20)
CHLORIDE: 107 mmol/L (ref 101–111)
CO2: 22 mmol/L (ref 22–32)
Calcium: 9.4 mg/dL (ref 8.9–10.3)
Creatinine, Ser: 0.62 mg/dL (ref 0.44–1.00)
GFR calc Af Amer: >60 mL/min (ref >60)
GFR calc non Af Amer: >60 mL/min (ref >60)
GLUCOSE: 85 mg/dL (ref 65–99)
POTASSIUM: 3.7 mmol/L (ref 3.5–5.1)
SODIUM: 136 mmol/L (ref 135–145)
Total Bilirubin: 0.4 mg/dL (ref 0.3–1.2)
Total Protein: 6.9 g/dL (ref 6.5–8.1)

## 2016-01-06 LAB — CBC
HCT: 35.9 % — ABNORMAL LOW (ref 36.0–46.0)
Hemoglobin: 12.8 g/dL (ref 12.0–15.0)
MCH: 30.8 pg (ref 26.0–34.0)
MCHC: 35.7 g/dL (ref 30.0–36.0)
MCV: 86.5 fL (ref 78.0–100.0)
Platelets: 197 10*3/uL (ref 150–400)
RBC: 4.15 MIL/uL (ref 3.87–5.11)
RDW: 12.4 % (ref 11.5–15.5)
WBC: 10.4 10*3/uL (ref 4.0–10.5)

## 2016-01-06 LAB — URINALYSIS, ROUTINE W REFLEX MICROSCOPIC
Bilirubin Urine: NEGATIVE
GLUCOSE, UA: 100 mg/dL — AB
Hgb urine dipstick: NEGATIVE
Ketones, ur: NEGATIVE mg/dL
Nitrite: NEGATIVE
PROTEIN: NEGATIVE mg/dL
pH: 5.5 (ref 5.0–8.0)

## 2016-01-06 LAB — URINE MICROSCOPIC-ADD ON

## 2016-01-06 MED ORDER — METOCLOPRAMIDE HCL 5 MG/ML IJ SOLN
10.0000 mg | Freq: Once | INTRAMUSCULAR | Status: AC
  Administered 2016-01-06: 10 mg via INTRAVENOUS
  Filled 2016-01-06: qty 2

## 2016-01-06 MED ORDER — BUTALBITAL-APAP-CAFFEINE 50-325-40 MG PO TABS: 1.0000 | ORAL_TABLET | Freq: Four times a day (QID) | ORAL | 1 refills | Status: DC | PRN

## 2016-01-06 MED ORDER — DIPHENHYDRAMINE HCL 50 MG/ML IJ SOLN
25.0000 mg | Freq: Once | INTRAMUSCULAR | Status: AC
  Administered 2016-01-06: 25 mg via INTRAVENOUS
  Filled 2016-01-06: qty 1

## 2016-01-06 MED ORDER — DEXAMETHASONE SODIUM PHOSPHATE 10 MG/ML IJ SOLN
10.0000 mg | Freq: Once | INTRAMUSCULAR | Status: AC
  Administered 2016-01-06: 10 mg via INTRAVENOUS
  Filled 2016-01-06: qty 1

## 2016-01-06 MED ORDER — PROMETHAZINE HCL 25 MG PO TABS: 25.0000 mg | ORAL_TABLET | Freq: Four times a day (QID) | ORAL | 1 refills | Status: DC | PRN

## 2016-01-06 MED ORDER — LACTATED RINGERS IV BOLUS (SEPSIS)
1000.0000 mL | Freq: Once | INTRAVENOUS | Status: AC
  Administered 2016-01-06: 1000 mL via INTRAVENOUS

## 2016-01-06 NOTE — MAU Provider Note (Signed)
History     CSN: [id number]  Arrival date and time: 01/06/16 [id number]   First Provider Initiated Contact with Patient 01/06/16 1940      Chief Complaint  Patient presents with  . Emesis During Pregnancy  . Diarrhea  . Headache   HPI Sharon Townsend is a 25 y.o. [phone] [id number] at 3731w6d who presents with n/v/d and headache. N/v/d x 2 days. Son & sister have been sick with similar symptoms. Reports 4 episodes of brown/clear watery stool today. Vomited 4 times today; reports being unable to keep down food or fluids today. Denies fever/chills, abdominal pain, vaginal bleeding, or LOF. Took diclegis today at 5 pm; no relief of symptoms.  Also reports headache today. Similar to previous headaches. Reports pain 8/10. Took tylenol#3 today without relief.   OB History    Gravida Para Term Preterm AB Living   4 2   2 1 1    SAB TAB Ectopic Multiple Live Births   1     0 1      Past Medical History:  Diagnosis Date  . Bipolar disorder (HCC)   . Chronic constipation   . Gall bladder stones   . Renal disorder    two tubes in left kidney going to bladder    Past Surgical History:  Procedure Laterality Date  . Childbirth  08-03-14  . CHOLECYSTECTOMY    . MANDIBLE SURGERY    . renal stents    . TONSILLECTOMY      Family History  Problem Relation Age of Onset  . Diabetes Mother   . Diabetes Father   . Hypertension Father     Social History  Substance Use Topics  . Smoking status: Never Smoker  . Smokeless tobacco: Never Used  . Alcohol use No     Comment: before preg    Allergies:  Allergies  Allergen Reactions  . Peanuts [Peanut Oil] Anaphylaxis  . Latex Swelling  . Penicillins Other (See Comments)    Reaction:  Pt states that it "shuts down her bowels and cannot urinate"    Prescriptions Prior to Admission  Medication Sig Dispense Refill Last Dose  . butalbital-acetaminophen-caffeine (FIORICET) 50-325-40 MG tablet Take 1-2 tablets by mouth every 6 (six) hours as needed  for headache. 20 tablet 0   . cetirizine (ZYRTEC) 10 MG tablet Take 10 mg by mouth daily as needed for allergies.   Past Week at Unknown time  . Iron-FA-B Cmp-C-Biot-Probiotic (FUSION PLUS) CAPS Take 1 capsule by mouth daily before breakfast. 30 capsule 5 10/21/2015 at Unknown time  . promethazine (PHENERGAN) 25 MG tablet Take 0.5-1 tablets (12.5-25 mg total) by mouth every 6 (six) hours as needed. 30 tablet 0     Review of Systems  Constitutional: Negative for chills and fever.  Gastrointestinal: Positive for diarrhea, nausea and vomiting. Negative for abdominal pain, blood in stool and constipation.  Genitourinary: Negative.    Physical Exam   Blood pressure 113/69, pulse 94, temperature 98.6 F (37 C), temperature source Oral, resp. rate 16, height 5\' 1"  (1.549 m), weight 161 lb (73 kg), last menstrual period 09/24/2015, SpO2 100 %, currently breastfeeding.  Physical Exam  Nursing note and vitals reviewed. Constitutional: She is oriented to person, place, and time. She appears well-developed and well-nourished. No distress.  HENT:  Head: Normocephalic and atraumatic.  Eyes: Conjunctivae are normal. Right eye exhibits no discharge. Left eye exhibits no discharge. No scleral icterus.  Neck: Normal range of motion.  Cardiovascular: Normal rate, regular  rhythm and normal heart sounds.   No murmur heard. Respiratory: Effort normal and breath sounds normal. No respiratory distress. She has no wheezes.  GI: Soft. Bowel sounds are normal. There is no tenderness.  Neurological: She is alert and oriented to person, place, and time.  Skin: Skin is warm and dry. She is not diaphoretic.  Psychiatric: She has a normal mood and affect. Her behavior is normal. Judgment and thought content normal.    MAU Course  Procedures Results for orders placed or performed during the hospital encounter of 01/06/16 (from the past 24 hour(s))  Urinalysis, Routine w reflex microscopic (not at [facility])     Status:  Abnormal   Collection Time: 01/06/16  7:21 PM  Result Value Ref Range   Color, Urine YELLOW YELLOW   APPearance CLEAR CLEAR   Specific Gravity, Urine <1.005 (L) 1.005 - 1.030   pH 5.5 5.0 - 8.0   Glucose, UA 100 (A) NEGATIVE mg/dL   Hgb urine dipstick NEGATIVE NEGATIVE   Bilirubin Urine NEGATIVE NEGATIVE   Ketones, ur NEGATIVE NEGATIVE mg/dL   Protein, ur NEGATIVE NEGATIVE mg/dL   Nitrite NEGATIVE NEGATIVE   Leukocytes, UA SMALL (A) NEGATIVE  Urine microscopic-add on     Status: Abnormal   Collection Time: 01/06/16  7:21 PM  Result Value Ref Range   Squamous Epithelial / LPF 0-5 (A) NONE SEEN   WBC, UA 0-5 0 - 5 WBC/hpf   RBC / HPF 0-5 0 - 5 RBC/hpf   Bacteria, UA FEW (A) NONE SEEN    MDM FHT 150 by doppler Headache cocktail (reglan, decadron, benadryl) CBC, CMP Care turned over to [facility]         Judeth Horn, NP 01/06/2016 8:51 PM   HA resolved. Wants to go home. Discussed Hx, exam, interventions and response w/ Dr. Chestine Spore. OK for D/C.   Assessment and Plan   1. Gastroenteritis presumed infectious   2. Headache in pregnancy, antepartum, second trimester    D/C home in stable condition per consult w/ Dr. Chestine Spore. Advance diet slowly HA precautions.  Follow-up Information    [facility] .   Why:  as scheduled or sooner as needed if symptoms worsen. Contact information: [address] [place] [address] [phone]        [facility] [place] [facility] AD[facility] .   Contact information: [address] [id number] mc [place] [address] [phone]           Medication List    TAKE these medications   butalbital-acetaminophen-caffeine 50-325-40 MG tablet Commonly known as:  FIORICET Take 1-2 tablets by mouth every 6 (six) hours as needed for headache.   cetirizine 10 MG tablet Commonly known as:  ZYRTEC Take 10 mg by mouth daily as needed for allergies.   FUSION PLUS Caps Take 1  capsule by mouth daily before breakfast.   promethazine 25 MG tablet Commonly known as:  PHENERGAN Take 1 tablet (25 mg total) by mouth every 6 (six) hours as needed for nausea or vomiting. What changed:  how much to take  reasons to take this      [place], CNM 01/06/2016 10:40 PM

## 2016-01-06 NOTE — Discharge Instructions (Signed)

## 2016-01-06 NOTE — MAU Note (Signed)
Pt reports for the last 3 days she has had vomiting and diarrhea and today has been unable to keep water down. Also reports a headache x 2 hours.

## 2016-01-11 ENCOUNTER — Inpatient Hospital Stay (HOSPITAL_COMMUNITY)
Admission: AD | Admit: 2016-01-11 | Discharge: 2016-01-11 | Disposition: A | Discharge status: Home / Self Care | Payer: Medicaid Other | Source: Ambulatory Visit | Attending: Obstetrics and Gynecology | Admitting: Obstetrics and Gynecology

## 2016-01-11 ENCOUNTER — Encounter (HOSPITAL_COMMUNITY): Payer: Self-pay | Admitting: *Deleted

## 2016-01-11 DIAGNOSIS — O210 Mild hyperemesis gravidarum: Secondary | ICD-10-CM | POA: Diagnosis present

## 2016-01-11 DIAGNOSIS — A084 Viral intestinal infection, unspecified: Secondary | ICD-10-CM

## 2016-01-11 DIAGNOSIS — O219 Vomiting of pregnancy, unspecified: Secondary | ICD-10-CM | POA: Diagnosis not present

## 2016-01-11 DIAGNOSIS — Z7982 Long term (current) use of aspirin: Secondary | ICD-10-CM | POA: Insufficient documentation

## 2016-01-11 DIAGNOSIS — Z880 Allergy status to penicillin: Secondary | ICD-10-CM | POA: Diagnosis not present

## 2016-01-11 DIAGNOSIS — O99612 Diseases of the digestive system complicating pregnancy, second trimester: Secondary | ICD-10-CM | POA: Insufficient documentation

## 2016-01-11 DIAGNOSIS — Z3A15 15 weeks gestation of pregnancy: Secondary | ICD-10-CM | POA: Diagnosis not present

## 2016-01-11 LAB — COMPREHENSIVE METABOLIC PANEL
ALBUMIN: 3.5 g/dL (ref 3.5–5.0)
ALT: 17 U/L (ref 14–54)
AST: 16 U/L (ref 15–41)
Alkaline Phosphatase: 60 U/L (ref 38–126)
Anion gap: 10 (ref 5–15)
BUN: 8 mg/dL (ref 6–20)
CHLORIDE: 103 mmol/L (ref 101–111)
CO2: 20 mmol/L — ABNORMAL LOW (ref 22–32)
CREATININE: 0.61 mg/dL (ref 0.44–1.00)
Calcium: 8.8 mg/dL — ABNORMAL LOW (ref 8.9–10.3)
GFR calc Af Amer: >60 mL/min (ref >60)
GLUCOSE: 82 mg/dL (ref 65–99)
POTASSIUM: 3.3 mmol/L — AB (ref 3.5–5.1)
Sodium: 133 mmol/L — ABNORMAL LOW (ref 135–145)
Total Bilirubin: 0.7 mg/dL (ref 0.3–1.2)
Total Protein: 6.7 g/dL (ref 6.5–8.1)

## 2016-01-11 LAB — URINALYSIS, ROUTINE W REFLEX MICROSCOPIC
BILIRUBIN URINE: NEGATIVE
GLUCOSE, UA: NEGATIVE mg/dL
HGB URINE DIPSTICK: NEGATIVE
Leukocytes, UA: NEGATIVE
Nitrite: NEGATIVE
PROTEIN: NEGATIVE mg/dL
Specific Gravity, Urine: 1.025 (ref 1.005–1.030)
pH: 6 (ref 5.0–8.0)

## 2016-01-11 LAB — CBC
HEMATOCRIT: 35.7 % — AB (ref 36.0–46.0)
Hemoglobin: 13 g/dL (ref 12.0–15.0)
MCH: 30.8 pg (ref 26.0–34.0)
MCHC: 36.4 g/dL — ABNORMAL HIGH (ref 30.0–36.0)
MCV: 84.6 fL (ref 78.0–100.0)
PLATELETS: 206 10*3/uL (ref 150–400)
RBC: 4.22 MIL/uL (ref 3.87–5.11)
RDW: 12.8 % (ref 11.5–15.5)
WBC: 9.3 10*3/uL (ref 4.0–10.5)

## 2016-01-11 MED ORDER — SODIUM CHLORIDE 0.9 % IV SOLN
Freq: Once | INTRAVENOUS | Status: AC
  Administered 2016-01-11: 04:00:00 via INTRAVENOUS
  Filled 2016-01-11: qty 1000

## 2016-01-11 MED ORDER — LACTATED RINGERS IV SOLN
Freq: Once | INTRAVENOUS | Status: DC
  Filled 2016-01-11: qty 10

## 2016-01-11 MED ORDER — SODIUM CHLORIDE 0.9 % IV BOLUS (SEPSIS)
1000.0000 mL | Freq: Once | INTRAVENOUS | Status: AC
  Administered 2016-01-11: 1000 mL via INTRAVENOUS

## 2016-01-11 MED ORDER — ONDANSETRON 4 MG PO TBDP: 4.0000 mg | ORAL_TABLET | Freq: Four times a day (QID) | ORAL | 0 refills | Status: DC | PRN

## 2016-01-11 MED ORDER — ONDANSETRON HCL 4 MG/2ML IJ SOLN
4.0000 mg | INTRAMUSCULAR | Status: AC
  Administered 2016-01-11: 4 mg via INTRAVENOUS
  Filled 2016-01-11: qty 2

## 2016-01-11 NOTE — MAU Note (Addendum)
Was seen MAU 10/9 with same symptoms of n/v/d and headache. HAving abd cramping. Everything continues and is getting worse. Denies vag bleeding or d/c. Taking promethazine and Tylenol but not helping. Reports 11 watery stools in last 24hrs

## 2016-01-11 NOTE — Discharge Instructions (Signed)

## 2016-01-11 NOTE — MAU Provider Note (Signed)
Chief Complaint: Emesis During Pregnancy; Abdominal Cramping; Headache; and Diarrhea   First Provider Initiated Contact with Patient 01/11/16 0133      SUBJECTIVE HPI: Sharon Townsend is a 25 y.o. [id number] at 4624w4d by LMP who presents to maternity admissions reporting continued n/v/d and headache since she was seen in MAU on 01/11/16. She reports acute onset of vomiting on 10/9 that brought her in to the hospital. She was treated with IV fluids and given Phenergan for nausea. She reports her last dose was 3-4 hours ago.  It is not helping.  She reports 11 episodes of diarrhea that is watery in last 24 hours and 10-11 episodes of vomiting. She reports associated chills and body aches also, starting today.  Her h/a is intermittent but daily, unchanged since onset. She is taking Fioricet as prescribed which reduced pain but does not resolve her h/a.  She denies sick contacts or changes to her diet prior to symptoms.  Symptoms are constant and unchanged since onset.   She denies vaginal bleeding, vaginal itching/burning, urinary symptoms, or dizziness.   HPI  Past Medical History:  Diagnosis Date  . Bipolar disorder (HCC)   . Chronic constipation   . Gall bladder stones   . Renal disorder    two tubes in left kidney going to bladder   Past Surgical History:  Procedure Laterality Date  . CHOLECYSTECTOMY    . MANDIBLE SURGERY    . renal stents    . TONSILLECTOMY     Social History   Social History  . Marital status: Single    Spouse name: N/A  . Number of children: N/A  . Years of education: N/A   Occupational History  . Not on file.   Social History Main Topics  . Smoking status: Never Smoker  . Smokeless tobacco: Never Used  . Alcohol use No     Comment: before preg  . Drug use: No  . Sexual activity: Yes    Birth control/ protection: None   Other Topics Concern  . Not on file   Social History Narrative  . No narrative on file   No current facility-administered  medications on file prior to encounter.    Current Outpatient Prescriptions on File Prior to Encounter  Medication Sig Dispense Refill  . butalbital-acetaminophen-caffeine (FIORICET) 50-325-40 MG tablet Take 1-2 tablets by mouth every 6 (six) hours as needed for headache. 30 tablet 1  . cetirizine (ZYRTEC) 10 MG tablet Take 10 mg by mouth daily as needed for allergies.    . promethazine (PHENERGAN) 25 MG tablet Take 1 tablet (25 mg total) by mouth every 6 (six) hours as needed for nausea or vomiting. 30 tablet 1  . Iron-FA-B Cmp-C-Biot-Probiotic (FUSION PLUS) CAPS Take 1 capsule by mouth daily before breakfast. 30 capsule 5   Allergies  Allergen Reactions  . Peanuts [Peanut Oil] Anaphylaxis  . Latex Swelling  . Penicillins Other (See Comments)    Reaction:  Pt states that it "shuts down her bowels and cannot urinate"    ROS:  Review of Systems  Constitutional: Positive for chills. Negative for fatigue and fever.  Respiratory: Negative for shortness of breath.   Cardiovascular: Negative for chest pain.  Gastrointestinal: Positive for diarrhea, nausea and vomiting.  Genitourinary: Negative for difficulty urinating, dysuria, flank pain, pelvic pain, vaginal bleeding, vaginal discharge and vaginal pain.  Musculoskeletal: Positive for myalgias.  Neurological: Positive for headaches. Negative for dizziness.  Psychiatric/Behavioral: Negative.      I have  reviewed patient's Past Medical Hx, Surgical Hx, Family Hx, Social Hx, medications and allergies.   Physical Exam   Patient Vitals for the past 24 hrs:  BP Temp Pulse Resp SpO2 Height Weight  01/11/16 0134 - - (!) 0 - 98 % - -  01/11/16 0056 120/73 98.8 F (37.1 C) 119 18 - 5' (1.524 m) 163 lb (73.9 kg)   Constitutional: Well-developed, well-nourished female in no acute distress.  Cardiovascular: normal rate Respiratory: normal effort GI: Abd soft, non-tender. Pos BS x 4 MS: Extremities nontender, no edema, normal  ROM Neurologic: Alert and oriented x 4.  GU: Neg CVAT.  FHT 150 by doppler  LAB RESULTS Results for orders placed or performed during the hospital encounter of 01/11/16 (from the past 24 hour(s))  Urinalysis, Routine w reflex microscopic (not at [facility])     Status: Abnormal   Collection Time: 01/11/16  1:05 AM  Result Value Ref Range   Color, Urine YELLOW YELLOW   APPearance CLEAR CLEAR   Specific Gravity, Urine 1.025 1.005 - 1.030   pH 6.0 5.0 - 8.0   Glucose, UA NEGATIVE NEGATIVE mg/dL   Hgb urine dipstick NEGATIVE NEGATIVE   Bilirubin Urine NEGATIVE NEGATIVE   Ketones, ur >80 (A) NEGATIVE mg/dL   Protein, ur NEGATIVE NEGATIVE mg/dL   Nitrite NEGATIVE NEGATIVE   Leukocytes, UA NEGATIVE NEGATIVE  CBC     Status: Abnormal   Collection Time: 01/11/16  2:55 AM  Result Value Ref Range   WBC 9.3 4.0 - 10.5 K/uL   RBC 4.22 3.87 - 5.11 MIL/uL   Hemoglobin 13.0 12.0 - 15.0 g/dL   HCT 96.0 (L) 45.4 - 09.8 %   MCV 84.6 78.0 - 100.0 fL   MCH 30.8 26.0 - 34.0 pg   MCHC 36.4 (H) 30.0 - 36.0 g/dL   RDW 11.9 14.7 - 82.9 %   Platelets 206 150 - 400 K/uL  Comprehensive metabolic panel     Status: Abnormal   Collection Time: 01/11/16  2:55 AM  Result Value Ref Range   Sodium 133 (L) 135 - 145 mmol/L   Potassium 3.3 (L) 3.5 - 5.1 mmol/L   Chloride 103 101 - 111 mmol/L   CO2 20 (L) 22 - 32 mmol/L   Glucose, Bld 82 65 - 99 mg/dL   BUN 8 6 - 20 mg/dL   Creatinine, Ser 5.62 0.44 - 1.00 mg/dL   Calcium 8.8 (L) 8.9 - 10.3 mg/dL   Total Protein 6.7 6.5 - 8.1 g/dL   Albumin 3.5 3.5 - 5.0 g/dL   AST 16 15 - 41 U/L   ALT 17 14 - 54 U/L   Alkaline Phosphatase 60 38 - 126 U/L   Total Bilirubin 0.7 0.3 - 1.2 mg/dL   GFR calc non Af Amer >60 >60 mL/min   GFR calc Af Amer >60 >60 mL/min   Anion gap 10 5 - 15       IMAGING No results found.  MAU Management/MDM: Ordered labs and reviewed results.  Dehydration/ketones noted so fluid replaced with NS x 1000 ml and MVI plus Potassium 10 meqs in  NS x 1000 ml. Zofran 4 mg IV given with complete resolution of symptoms. Pt took Zofran in previous pregnancy.  Discussed possible risks of first trimester use of Zofran, unlikely a risk for her at 15 weeks. Pt aware and does agree to use of Zofran today.   Originally stool culture and C Diff culture ordered but cancelled because pt did  not need to stool during 4 hours in MAU.  Pt given supplies and may call office to provide stool sample if symptoms persist.  Return to MAU if symptoms worsen.  Pt stable at time of discharge.  ASSESSMENT 1. Nausea and vomiting during pregnancy prior to [redacted] weeks gestation   2. Viral gastroenteritis     PLAN Discharge home   Medication List    TAKE these medications   aspirin EC 81 MG tablet Take 81 mg by mouth daily.   butalbital-acetaminophen-caffeine 50-325-40 MG tablet Commonly known as:  FIORICET Take 1-2 tablets by mouth every 6 (six) hours as needed for headache.   cetirizine 10 MG tablet Commonly known as:  ZYRTEC Take 10 mg by mouth daily as needed for allergies.   FUSION PLUS Caps Take 1 capsule by mouth daily before breakfast.   ondansetron 4 MG disintegrating tablet Commonly known as:  ZOFRAN ODT Take 1 tablet (4 mg total) by mouth every 6 (six) hours as needed for nausea.   prenatal multivitamin Tabs tablet Take 1 tablet by mouth daily at 12 noon.   promethazine 25 MG tablet Commonly known as:  PHENERGAN Take 1 tablet (25 mg total) by mouth every 6 (six) hours as needed for nausea or vomiting.      Follow-up Information    Almon Hercules., MD .   Specialty:  Obstetrics and Gynecology Why:  As scheduled, Return to MAU as needed for emergencies Contact information: [address] [place] [address] [phone]           Sharen Counter Certified Nurse-Midwife 01/11/2016  4:46 AM

## 2016-01-11 NOTE — Progress Notes (Signed)
Sharen CounterLisa Leftwich-Kirby CNM in earlier to discuss test results and d/c plan. Written and verbal d/c instructions given and understanding voiced. Given 'hat' and 2 specimen containers to collect stool at home and take to office Monday if diarrhea continues.

## 2016-02-12 ENCOUNTER — Encounter (HOSPITAL_COMMUNITY): Payer: Self-pay

## 2016-02-12 ENCOUNTER — Inpatient Hospital Stay (HOSPITAL_COMMUNITY)
Admission: AD | Admit: 2016-02-12 | Discharge: 2016-02-12 | Disposition: A | Discharge status: Home / Self Care | Payer: Medicaid Other | Source: Ambulatory Visit | Attending: Obstetrics and Gynecology | Admitting: Obstetrics and Gynecology

## 2016-02-12 DIAGNOSIS — Y92099 Unspecified place in other non-institutional residence as the place of occurrence of the external cause: Secondary | ICD-10-CM

## 2016-02-12 DIAGNOSIS — O99342 Other mental disorders complicating pregnancy, second trimester: Secondary | ICD-10-CM | POA: Diagnosis not present

## 2016-02-12 DIAGNOSIS — R109 Unspecified abdominal pain: Secondary | ICD-10-CM

## 2016-02-12 DIAGNOSIS — Z3A20 20 weeks gestation of pregnancy: Secondary | ICD-10-CM | POA: Diagnosis not present

## 2016-02-12 DIAGNOSIS — O26892 Other specified pregnancy related conditions, second trimester: Secondary | ICD-10-CM | POA: Insufficient documentation

## 2016-02-12 DIAGNOSIS — F319 Bipolar disorder, unspecified: Secondary | ICD-10-CM | POA: Insufficient documentation

## 2016-02-12 DIAGNOSIS — W19XXXA Unspecified fall, initial encounter: Secondary | ICD-10-CM | POA: Diagnosis not present

## 2016-02-12 DIAGNOSIS — R1030 Lower abdominal pain, unspecified: Secondary | ICD-10-CM | POA: Insufficient documentation

## 2016-02-12 DIAGNOSIS — W1830XA Fall on same level, unspecified, initial encounter: Secondary | ICD-10-CM | POA: Insufficient documentation

## 2016-02-12 DIAGNOSIS — Z7982 Long term (current) use of aspirin: Secondary | ICD-10-CM | POA: Diagnosis not present

## 2016-02-12 DIAGNOSIS — O9989 Other specified diseases and conditions complicating pregnancy, childbirth and the puerperium: Secondary | ICD-10-CM | POA: Diagnosis not present

## 2016-02-12 DIAGNOSIS — Z79899 Other long term (current) drug therapy: Secondary | ICD-10-CM | POA: Insufficient documentation

## 2016-02-12 DIAGNOSIS — Y92009 Unspecified place in unspecified non-institutional (private) residence as the place of occurrence of the external cause: Secondary | ICD-10-CM

## 2016-02-12 LAB — URINALYSIS, ROUTINE W REFLEX MICROSCOPIC
BILIRUBIN URINE: NEGATIVE
Glucose, UA: NEGATIVE mg/dL
Hgb urine dipstick: NEGATIVE
Ketones, ur: NEGATIVE mg/dL
Leukocytes, UA: NEGATIVE
NITRITE: NEGATIVE
PROTEIN: NEGATIVE mg/dL
SPECIFIC GRAVITY, URINE: 1.01 (ref 1.005–1.030)
pH: 6 (ref 5.0–8.0)

## 2016-02-12 MED ORDER — ACETAMINOPHEN 500 MG PO TABS
1000.0000 mg | ORAL_TABLET | Freq: Once | ORAL | Status: AC
  Administered 2016-02-12: 1000 mg via ORAL
  Filled 2016-02-12: qty 2

## 2016-02-12 NOTE — Discharge Instructions (Signed)
What Do I Need to Know About Injuries During Pregnancy? °Injuries can happen during pregnancy. Minor falls and accidents usually do not harm you or your baby. However, any injury should be reported to your doctor. °What can I do to protect myself from injuries? °· Remove rugs and loose objects on the floor. °· Wear comfortable shoes that have a good grip. Do not wear high-heeled shoes. °· Always wear your seat belt. The lap belt should be below your belly. Always practice safe driving. °· Do not ride on a motorcycle. °· Do not participate in high-impact activities or sports. °· Avoid: °¨ Walking on wet or slippery floors. °¨ Fires. °¨ Starting fires. °¨ Lifting heavy pots of boiling or hot liquids. °¨ Fixing electrical problems. °· Only take medicine as told by your doctor. °· Know your blood type and the blood type of the baby's father. °· Call your local emergency services (911 in the [place] if you are a victim of domestic violence or assault. For help and support, contact the National Domestic Violence Hotline. °Get help right away if: °· You fall on your belly or have any high-impact accident or injury. °· You have been a victim of domestic violence or any kind of violence. °· You have been in a car accident. °· You have bleeding from your vagina. °· Fluid is leaking from your vagina. °· You start to have belly cramping (contractions) or pain. °· You feel weak or pass out (faint). °· You start to throw up (vomit) after an injury. °· You have been burned. °· You have a stiff neck or neck pain. °· You get a headache or have vision problems after an injury. °· You do not feel the baby move or the baby is not moving as much as normal. °This information is not intended to replace advice given to you by your health care provider. Make sure you discuss any questions you have with your health care provider. °Document Released: 04/18/2010 Document Revised: 08/22/2015 Document Reviewed: 12/21/2012 °Elsevier Interactive  Patient Education © 2017 [employer]. ° °

## 2016-02-12 NOTE — MAU Note (Signed)
Larey SeatFell about an hour, stepped on something, slipped and fell flat on abd and left hand.  Reports baby moved right after. Is having some cramping now

## 2016-02-12 NOTE — MAU Provider Note (Signed)
History     CSN: [id number]  Arrival date and time: 02/12/16 1721   First Provider Initiated Contact with Patient 02/12/16 1754      Chief Complaint  Patient presents with  . Fall   HPI  Ms. Sharon FriedlanderBrittany R Townsend is a 25 y.o. [phone] at 3369w1d who presents to MAU today with complaint of a fall at home today. The patient states that she tripped and fell onto her abdomen on a carpeted surface. She has had constant lower abdominal pain since then rated at 9/10 now. She denies vaginal bleeding, discharge or LOF. She has not taken anything for pain. She reports good fetal movement and denies complications with the pregnancy.   OB History    Gravida Para Term Preterm AB Living   4 2   2 1 1    SAB TAB Ectopic Multiple Live Births   1     0 1      Past Medical History:  Diagnosis Date  . Bipolar disorder (HCC)   . Chronic constipation   . Gall bladder stones   . Renal disorder    two tubes in left kidney going to bladder    Past Surgical History:  Procedure Laterality Date  . CHOLECYSTECTOMY    . MANDIBLE SURGERY    . renal stents    . TONSILLECTOMY      Family History  Problem Relation Age of Onset  . Diabetes Mother   . Diabetes Father   . Hypertension Father     Social History  Substance Use Topics  . Smoking status: Never Smoker  . Smokeless tobacco: Never Used  . Alcohol use No     Comment: before preg    Allergies:  Allergies  Allergen Reactions  . Peanuts [Peanut Oil] Anaphylaxis  . Latex Swelling  . Penicillins Other (See Comments)    Reaction:  Pt states that it "shuts down her bowels and cannot urinate"    Prescriptions Prior to Admission  Medication Sig Dispense Refill Last Dose  . aspirin EC 81 MG tablet Take 81 mg by mouth daily.   01/10/2016 at Unknown time  . butalbital-acetaminophen-caffeine (FIORICET) 50-325-40 MG tablet Take 1-2 tablets by mouth every 6 (six) hours as needed for headache. 30 tablet 1 01/10/2016 at Unknown time  . cetirizine  (ZYRTEC) 10 MG tablet Take 10 mg by mouth daily as needed for allergies.   01/10/2016 at Unknown time  . Iron-FA-B Cmp-C-Biot-Probiotic (FUSION PLUS) CAPS Take 1 capsule by mouth daily before breakfast. 30 capsule 5 10/21/2015 at Unknown time  . ondansetron (ZOFRAN ODT) 4 MG disintegrating tablet Take 1 tablet (4 mg total) by mouth every 6 (six) hours as needed for nausea. 20 tablet 0   . Prenatal Vit-Fe Fumarate-FA (PRENATAL MULTIVITAMIN) TABS tablet Take 1 tablet by mouth daily at 12 noon.   01/10/2016 at Unknown time  . promethazine (PHENERGAN) 25 MG tablet Take 1 tablet (25 mg total) by mouth every 6 (six) hours as needed for nausea or vomiting. 30 tablet 1 01/10/2016 at Unknown time    Review of Systems  Gastrointestinal: Positive for abdominal pain.  Genitourinary:       Ng - vaginal bleeding, discharge, LOF   Physical Exam   Blood pressure 117/68, pulse 88, temperature 98.1 F (36.7 C), temperature source Oral, resp. rate 16, height 5' (1.524 m), weight 166 lb 9.6 oz (75.6 kg), last menstrual period 09/24/2015, currently breastfeeding.  Physical Exam  Nursing note and vitals reviewed. Constitutional: She  is oriented to person, place, and time. She appears well-developed and well-nourished. No distress.  HENT:  Head: Normocephalic and atraumatic.  Cardiovascular: Normal rate.   Respiratory: Effort normal.  GI: Soft. She exhibits no distension. There is tenderness (mild tenderness to palpation of the lower abdomen bilaterally).  Neurological: She is alert and oriented to person, place, and time.  Skin: Skin is warm and dry. No erythema.  Psychiatric: She has a normal mood and affect.    Results for orders placed or performed during the hospital encounter of 02/12/16 (from the past 24 hour(s))  Urinalysis, Routine w reflex microscopic (not [facility])     Status: None   Collection Time: 02/12/16  5:40 PM  Result Value Ref Range   Color, Urine YELLOW YELLOW   APPearance CLEAR CLEAR    Specific Gravity, Urine 1.010 1.005 - 1.030   pH 6.0 5.0 - 8.0   Glucose, UA NEGATIVE NEGATIVE mg/dL   Hgb urine dipstick NEGATIVE NEGATIVE   Bilirubin Urine NEGATIVE NEGATIVE   Ketones, ur NEGATIVE NEGATIVE mg/dL   Protein, ur NEGATIVE NEGATIVE mg/dL   Nitrite NEGATIVE NEGATIVE   Leukocytes, UA NEGATIVE NEGATIVE    MAU Course  Procedures None  MDM Discussed patient with Dr. Claiborne Billingsallahan. Ok to offer Tylenol for pain. US not needed today.   Assessment and Plan  A: SIUP at 5274w1d  Fall at home  P: Discharge home Tylenol PRN for pain advised Warning signs for worsening condition discussed Patient advised [facility] for routine prenatal care or sooner if symptoms change or worsen Patient may return to MAU as needed or if her condition were to change or worsen   Marny LowensteinJulie N Wenzel, PA-C  02/12/2016, 5:54 PM

## 2016-02-27 ENCOUNTER — Encounter (HOSPITAL_COMMUNITY): Payer: Self-pay

## 2016-02-27 ENCOUNTER — Inpatient Hospital Stay (HOSPITAL_COMMUNITY): Payer: Medicaid Other

## 2016-02-27 ENCOUNTER — Inpatient Hospital Stay (HOSPITAL_COMMUNITY)
Admission: AD | Admit: 2016-02-27 | Discharge: 2016-02-27 | Disposition: A | Discharge status: Home / Self Care | Payer: Medicaid Other | Source: Ambulatory Visit | Attending: Obstetrics and Gynecology | Admitting: Obstetrics and Gynecology

## 2016-02-27 DIAGNOSIS — O99342 Other mental disorders complicating pregnancy, second trimester: Secondary | ICD-10-CM | POA: Insufficient documentation

## 2016-02-27 DIAGNOSIS — Z3A22 22 weeks gestation of pregnancy: Secondary | ICD-10-CM

## 2016-02-27 DIAGNOSIS — O09212 Supervision of pregnancy with history of pre-term labor, second trimester: Secondary | ICD-10-CM

## 2016-02-27 DIAGNOSIS — O26899 Other specified pregnancy related conditions, unspecified trimester: Secondary | ICD-10-CM

## 2016-02-27 DIAGNOSIS — F319 Bipolar disorder, unspecified: Secondary | ICD-10-CM | POA: Diagnosis not present

## 2016-02-27 DIAGNOSIS — Z880 Allergy status to penicillin: Secondary | ICD-10-CM | POA: Insufficient documentation

## 2016-02-27 DIAGNOSIS — Z79899 Other long term (current) drug therapy: Secondary | ICD-10-CM | POA: Diagnosis not present

## 2016-02-27 DIAGNOSIS — O26892 Other specified pregnancy related conditions, second trimester: Secondary | ICD-10-CM | POA: Diagnosis not present

## 2016-02-27 DIAGNOSIS — Z91010 Allergy to peanuts: Secondary | ICD-10-CM | POA: Diagnosis not present

## 2016-02-27 DIAGNOSIS — Z7982 Long term (current) use of aspirin: Secondary | ICD-10-CM | POA: Diagnosis not present

## 2016-02-27 DIAGNOSIS — R109 Unspecified abdominal pain: Secondary | ICD-10-CM | POA: Diagnosis present

## 2016-02-27 DIAGNOSIS — O09892 Supervision of other high risk pregnancies, second trimester: Secondary | ICD-10-CM

## 2016-02-27 LAB — URINALYSIS, ROUTINE W REFLEX MICROSCOPIC
Bilirubin Urine: NEGATIVE
GLUCOSE, UA: 250 mg/dL — AB
Hgb urine dipstick: NEGATIVE
Ketones, ur: NEGATIVE mg/dL
LEUKOCYTES UA: NEGATIVE
Nitrite: NEGATIVE
PROTEIN: NEGATIVE mg/dL
pH: 6 (ref 5.0–8.0)

## 2016-02-27 NOTE — MAU Note (Signed)
Patient presents with c/o pressure that started last night. Patient also states that she had 4 ctx yesterday. Patient denies any bleeding or LOF.

## 2016-02-27 NOTE — MAU Provider Note (Signed)
History     CSN: [id number]  Arrival date and time: 02/27/16 [id number]   First Provider Initiated Contact with Patient 02/27/16 1937        Chief Complaint  Patient presents with  . Abdominal Pain   HPI Sharon Townsend is a 25 y.o. [id number] at 3242w2d who presents with abdominal pain. History significant for 2 preterm deliveries. Reports 4 contractions yesterday and 5 contractions per hour for the last few hours. Rates pain 8/10 & describes as lower abdominal pressure. Denies n/v/d, constipation, dysuria, vaginal bleeding, LOF, or vaginal discharge. No recent intercourse. Positive fetal movement. States she thought she was told during her anatomy ultrasound that her cervix was 0.5 cm but unsure if that was dilation or cervical length.   OB History    Gravida Para Term Preterm AB Living   4 2   2 1 1    SAB TAB Ectopic Multiple Live Births   1     0 1      Past Medical History:  Diagnosis Date  . Bipolar disorder (HCC)   . Chronic constipation   . Gall bladder stones   . Renal disorder    two tubes in left kidney going to bladder    Past Surgical History:  Procedure Laterality Date  . CHOLECYSTECTOMY    . MANDIBLE SURGERY    . renal stents    . TONSILLECTOMY      Family History  Problem Relation Age of Onset  . Diabetes Mother   . Diabetes Father   . Hypertension Father     Social History  Substance Use Topics  . Smoking status: Never Smoker  . Smokeless tobacco: Never Used  . Alcohol use No     Comment: before preg    Allergies:  Allergies  Allergen Reactions  . Peanuts [Peanut Oil] Anaphylaxis  . Latex Swelling  . Penicillins Other (See Comments)    Reaction:  Pt states that it "shuts down her bowels and cannot urinate"    Prescriptions Prior to Admission  Medication Sig Dispense Refill Last Dose  . aspirin EC 81 MG tablet Take 81 mg by mouth daily.   01/10/2016 at Unknown time  . butalbital-acetaminophen-caffeine (FIORICET) 50-325-40 MG tablet Take 1-2  tablets by mouth every 6 (six) hours as needed for headache. 30 tablet 1 01/10/2016 at Unknown time  . cetirizine (ZYRTEC) 10 MG tablet Take 10 mg by mouth daily as needed for allergies.   01/10/2016 at Unknown time  . Iron-FA-B Cmp-C-Biot-Probiotic (FUSION PLUS) CAPS Take 1 capsule by mouth daily before breakfast. 30 capsule 5 10/21/2015 at Unknown time  . ondansetron (ZOFRAN ODT) 4 MG disintegrating tablet Take 1 tablet (4 mg total) by mouth every 6 (six) hours as needed for nausea. 20 tablet 0   . Prenatal Vit-Fe Fumarate-FA (PRENATAL MULTIVITAMIN) TABS tablet Take 1 tablet by mouth daily at 12 noon.   01/10/2016 at Unknown time  . promethazine (PHENERGAN) 25 MG tablet Take 1 tablet (25 mg total) by mouth every 6 (six) hours as needed for nausea or vomiting. 30 tablet 1 01/10/2016 at Unknown time    Review of Systems  Constitutional: Negative.   Gastrointestinal: Positive for abdominal pain. Negative for constipation, diarrhea, nausea and vomiting.  Genitourinary: Negative.    Physical Exam   Blood pressure 107/71, pulse 93, temperature 98.4 F (36.9 C), temperature source Oral, resp. rate 18, last menstrual period 09/24/2015, currently breastfeeding.  Physical Exam  Nursing note and vitals reviewed. Constitutional: She  is oriented to person, place, and time. She appears well-developed and well-nourished. No distress.  HENT:  Head: Normocephalic and atraumatic.  Eyes: Conjunctivae are normal. Right eye exhibits no discharge. Left eye exhibits no discharge. No scleral icterus.  Neck: Normal range of motion.  Cardiovascular: Normal rate, regular rhythm and normal heart sounds.   No murmur heard. Respiratory: Effort normal and breath sounds normal. No respiratory distress. She has no wheezes.  GI: Soft. There is no tenderness.  Neurological: She is alert and oriented to person, place, and time.  Skin: Skin is warm and dry. She is not diaphoretic.  Psychiatric: She has a normal mood and  affect. Her behavior is normal. Judgment and thought content normal.   Dilation: Closed Exam by:: Sharon HornErin Lawrence NP   MAU Course  Procedures Results for orders placed or performed during the hospital encounter of 02/27/16 (from the past 24 hour(s))  Urinalysis, Routine w reflex microscopic (not at [facility])     Status: Abnormal   Collection Time: 02/27/16  7:17 PM  Result Value Ref Range   Color, Urine YELLOW YELLOW   APPearance CLEAR CLEAR   Specific Gravity, Urine >1.030 (H) 1.005 - 1.030   pH 6.0 5.0 - 8.0   Glucose, UA 250 (A) NEGATIVE mg/dL   Hgb urine dipstick NEGATIVE NEGATIVE   Bilirubin Urine NEGATIVE NEGATIVE   Ketones, ur NEGATIVE NEGATIVE mg/dL   Protein, ur NEGATIVE NEGATIVE mg/dL   Nitrite NEGATIVE NEGATIVE   Leukocytes, UA NEGATIVE NEGATIVE    MDM FHT 156 by doppler VSS, NAD Abd soft & non tender; SVE closed/thick (ext fingertip) Prenatal records not scanned -- will get u/a for cervical length--- CL 4.6 cm S/w Dr. Henderson CloudHorvath regarding SVE & ultrasound -- ok to discharge home  Assessment and Plan  A: 1. [redacted] weeks gestation of pregnancy   2. History of preterm delivery, currently pregnant in second trimester   3. Abdominal pain affecting pregnancy    P: Discharge home Preterm labor precautions Keep scheduled OB appts  Sharon Townsend 02/27/2016, 7:37 PM

## 2016-02-27 NOTE — Discharge Instructions (Signed)
. °Preterm Labor and Birth Information °The normal length of a pregnancy is 39-41 weeks. Preterm labor is when labor starts before 37 completed weeks of pregnancy. °What are the risk factors for preterm labor? °Preterm labor is more likely to occur in women who: °· Have certain infections during pregnancy such as a bladder infection, sexually transmitted infection, or infection inside the uterus (chorioamnionitis). °· Have a shorter-than-normal cervix. °· Have gone into preterm labor before. °· Have had surgery on their cervix. °· Are younger than age 17 or older than age 35. °· Are African American. °· Are pregnant with twins or multiple babies (multiple gestation). °· Take street drugs or smoke while pregnant. °· Do not gain enough weight while pregnant. °· Became pregnant shortly after having been pregnant. °What are the symptoms of preterm labor? °Symptoms of preterm labor include: °· Cramps similar to those that can happen during a menstrual period. The cramps may happen with diarrhea. °· Pain in the abdomen or lower back. °· Regular uterine contractions that may feel like tightening of the abdomen. °· A feeling of increased pressure in the pelvis. °· Increased watery or bloody mucus discharge from the vagina. °· Water breaking (ruptured amniotic sac). °Why is it important to recognize signs of preterm labor? °It is important to recognize signs of preterm labor because babies who are born prematurely may not be fully developed. This can put them at an increased risk for: °· Long-term (chronic) heart and lung problems. °· Difficulty immediately after birth with regulating body systems, including blood sugar, body temperature, heart rate, and breathing rate. °· Bleeding in the brain. °· Cerebral palsy. °· Learning difficulties. °· Death. °These risks are highest for babies who are born before 34 weeks of pregnancy. °How is preterm labor treated? °Treatment depends on the length of your pregnancy, your condition,  and the health of your baby. It may involve: °· Having a stitch (suture) placed in your cervix to prevent your cervix from opening too early (cerclage). °· Taking or being given medicines, such as: °¨ Hormone medicines. These may be given early in pregnancy to help support the pregnancy. °¨ Medicine to stop contractions. °¨ Medicines to help mature the baby’s lungs. These may be prescribed if the risk of delivery is high. °¨ Medicines to prevent your baby from developing cerebral palsy. °If the labor happens before 34 weeks of pregnancy, you may need to stay in the hospital. °What should I do if I think I am in preterm labor? °If you think that you are going into preterm labor, call your health care provider right away. °How can I prevent preterm labor in future pregnancies? °To increase your chance of having a full-term pregnancy: °· Do not use any tobacco products, such as cigarettes, chewing tobacco, and e-cigarettes. If you need help quitting, ask your health care provider. °· Do not use street drugs or medicines that have not been prescribed to you during your pregnancy. °· Talk with your health care provider before taking any herbal supplements, even if you have been taking them regularly. °· Make sure you gain a healthy amount of weight during your pregnancy. °· Watch for infection. If you think that you might have an infection, get it checked right away. °· Make sure to tell your health care provider if you have gone into preterm labor before. °This information is not intended to replace advice given to you by your health care provider. Make sure you discuss any questions you have with your   health care provider. °Document Released: 06/06/2003 Document Revised: 08/27/2015 Document Reviewed: 08/07/2015 °Elsevier Interactive Patient Education © 2017 [employer] ° °

## 2016-03-09 ENCOUNTER — Inpatient Hospital Stay (HOSPITAL_COMMUNITY)
Admission: AD | Admit: 2016-03-09 | Discharge: 2016-03-09 | Disposition: A | Discharge status: Home / Self Care | Payer: Medicaid Other | Source: Ambulatory Visit | Attending: Obstetrics and Gynecology | Admitting: Obstetrics and Gynecology

## 2016-03-09 ENCOUNTER — Encounter (HOSPITAL_COMMUNITY): Payer: Self-pay

## 2016-03-09 DIAGNOSIS — O219 Vomiting of pregnancy, unspecified: Secondary | ICD-10-CM

## 2016-03-09 DIAGNOSIS — Z7982 Long term (current) use of aspirin: Secondary | ICD-10-CM | POA: Diagnosis not present

## 2016-03-09 DIAGNOSIS — Z3A23 23 weeks gestation of pregnancy: Secondary | ICD-10-CM | POA: Insufficient documentation

## 2016-03-09 DIAGNOSIS — R109 Unspecified abdominal pain: Secondary | ICD-10-CM | POA: Diagnosis not present

## 2016-03-09 DIAGNOSIS — Z880 Allergy status to penicillin: Secondary | ICD-10-CM | POA: Diagnosis not present

## 2016-03-09 DIAGNOSIS — O26892 Other specified pregnancy related conditions, second trimester: Secondary | ICD-10-CM | POA: Insufficient documentation

## 2016-03-09 DIAGNOSIS — O210 Mild hyperemesis gravidarum: Secondary | ICD-10-CM | POA: Diagnosis not present

## 2016-03-09 DIAGNOSIS — O26899 Other specified pregnancy related conditions, unspecified trimester: Secondary | ICD-10-CM

## 2016-03-09 LAB — URINALYSIS, ROUTINE W REFLEX MICROSCOPIC
BILIRUBIN URINE: NEGATIVE
Glucose, UA: 50 mg/dL — AB
Hgb urine dipstick: NEGATIVE
Ketones, ur: 5 mg/dL — AB
NITRITE: NEGATIVE
PH: 6 (ref 5.0–8.0)
Protein, ur: NEGATIVE mg/dL
SPECIFIC GRAVITY, URINE: 1.018 (ref 1.005–1.030)

## 2016-03-09 MED ORDER — ONDANSETRON 8 MG PO TBDP
8.0000 mg | ORAL_TABLET | Freq: Once | ORAL | Status: AC
  Administered 2016-03-09: 8 mg via ORAL
  Filled 2016-03-09: qty 1

## 2016-03-09 MED ORDER — ONDANSETRON 4 MG PO TBDP: 4.0000 mg | ORAL_TABLET | Freq: Three times a day (TID) | ORAL | 0 refills | Status: DC | PRN

## 2016-03-09 NOTE — MAU Provider Note (Signed)
History     CSN: [id number]  Arrival date and time: 03/09/16 [id number]   First Provider Initiated Contact with Patient 03/09/16 1958      Chief Complaint  Patient presents with  . Abdominal Pain  . Back Pain  . Emesis   HPI Sharon Townsend is a 25 y.o. [phone] at 5268w6d who presents with abdominal pain & n/v. Reports intermittent lower abdominal pain since this afternoon after picking up her nieces. Pain is cramp like & radiates to lower back pain. Pain occurs 6 times per hour. Rates pain 9/10. Has not treated. Also reports n/v today. Has had n/v throughout the pregnancy & has recently run out of her antiemetics. Has vomited twice today. Denies fever, LOF, vaginal bleeding, recent intercourse, diarrhea or constipation. Positive fetal movement.   OB History    Gravida Para Term Preterm AB Living   4 2   2 1 1    SAB TAB Ectopic Multiple Live Births   1     0 1      Past Medical History:  Diagnosis Date  . Bipolar disorder (HCC)   . Chronic constipation   . Gall bladder stones   . Renal disorder    two tubes in left kidney going to bladder    Past Surgical History:  Procedure Laterality Date  . CHOLECYSTECTOMY    . MANDIBLE SURGERY    . renal stents    . TONSILLECTOMY      Family History  Problem Relation Age of Onset  . Diabetes Mother   . Diabetes Father   . Hypertension Father     Social History  Substance Use Topics  . Smoking status: Never Smoker  . Smokeless tobacco: Never Used  . Alcohol use No     Comment: before preg    Allergies:  Allergies  Allergen Reactions  . Peanuts [Peanut Oil] Anaphylaxis  . Latex Swelling  . Penicillins Other (See Comments)    Reaction:  Pt states that it "shuts down her bowels and cannot urinate"    Prescriptions Prior to Admission  Medication Sig Dispense Refill Last Dose  . acetaminophen (TYLENOL) 500 MG tablet Take 500 mg by mouth every 6 (six) hours as needed for mild pain.   Past Week at Unknown time  . aspirin  EC 81 MG tablet Take 81 mg by mouth daily.   03/09/2016 at Unknown time  . butalbital-acetaminophen-caffeine (FIORICET) 50-325-40 MG tablet Take 1-2 tablets by mouth every 6 (six) hours as needed for headache. 30 tablet 1 Past Month at Unknown time  . Iron-FA-B Cmp-C-Biot-Probiotic (FUSION PLUS) CAPS Take 1 capsule by mouth daily before breakfast. 30 capsule 5 03/09/2016 at Unknown time  . Prenatal Vit-Fe Fumarate-FA (PRENATAL MULTIVITAMIN) TABS tablet Take 1 tablet by mouth daily at 12 noon.   03/09/2016 at Unknown time  . promethazine (PHENERGAN) 25 MG tablet Take 1 tablet (25 mg total) by mouth every 6 (six) hours as needed for nausea or vomiting. 30 tablet 1 03/08/2016 at Unknown time  . ondansetron (ZOFRAN ODT) 4 MG disintegrating tablet Take 1 tablet (4 mg total) by mouth every 6 (six) hours as needed for nausea. (Patient not taking: Reported on 03/09/2016) 20 tablet 0 Not Taking at Unknown time    Review of Systems  Constitutional: Negative.   Gastrointestinal: Positive for abdominal pain, nausea and vomiting. Negative for constipation and diarrhea.  Genitourinary: Negative.   Musculoskeletal: Positive for back pain.   Physical Exam   Blood pressure 120/72,  pulse 95, temperature 98.8 F (37.1 C), temperature source Oral, resp. rate 18, weight 172 lb 6.4 oz (78.2 kg), last menstrual period 09/24/2015, currently breastfeeding.  Physical Exam  Nursing note and vitals reviewed. Constitutional: She is oriented to person, place, and time. She appears well-developed and well-nourished. No distress.  HENT:  Head: Normocephalic and atraumatic.  Eyes: Conjunctivae are normal. Right eye exhibits no discharge. Left eye exhibits no discharge. No scleral icterus.  Neck: Normal range of motion.  Respiratory: Effort normal and breath sounds normal. No respiratory distress.  GI: Soft. There is no tenderness.  No ctx palpated  Neurological: She is alert and oriented to person, place, and time.   Skin: Skin is warm and dry. She is not diaphoretic.  Psychiatric: She has a normal mood and affect. Her behavior is normal. Judgment and thought content normal.   Dilation: Closed Effacement (%): Thick Cervical Position: Posterior Station: -3 Exam by:: Judeth HornErin Rosy Estabrook NP  Fetal Tracing:  Baseline: 140 Variability: moderate Accelerations: 10x10 Decelerations: none  Toco: ? UI  MAU Course  Procedures Results for orders placed or performed during the hospital encounter of 03/09/16 (from the past 24 hour(s))  Urinalysis, Routine w reflex microscopic     Status: Abnormal   Collection Time: 03/09/16  6:30 PM  Result Value Ref Range   Color, Urine YELLOW YELLOW   APPearance HAZY (A) CLEAR   Specific Gravity, Urine 1.018 1.005 - 1.030   pH 6.0 5.0 - 8.0   Glucose, UA 50 (A) NEGATIVE mg/dL   Hgb urine dipstick NEGATIVE NEGATIVE   Bilirubin Urine NEGATIVE NEGATIVE   Ketones, ur 5 (A) NEGATIVE mg/dL   Protein, ur NEGATIVE NEGATIVE mg/dL   Nitrite NEGATIVE NEGATIVE   Leukocytes, UA TRACE (A) NEGATIVE   RBC / HPF 0-5 0 - 5 RBC/hpf   WBC, UA 0-5 0 - 5 WBC/hpf   Bacteria, UA RARE (A) NONE SEEN   Squamous Epithelial / LPF 0-5 (A) NONE SEEN   Mucous PRESENT     MDM Category 1 fetal tracing -- no ctx palpated. Artifact vs ctx on toco Cervix unchanged since previous visit last week Zofran 8 mg odt No vomiting in MAU VSS & NAD S/w Dr. Tenny Crawoss. Ok to discharge home  Assessment and Plan  A: 1. Abdominal pain affecting pregnancy   2. Vomiting pregnancy    P: Discharge home Rx zofran Preterm labor precautions Keep f/u with OB  Judeth HornErin Mellany Dinsmore 03/09/2016, 7:54 PM

## 2016-03-09 NOTE — Discharge Instructions (Signed)
. °Preterm Labor and Birth Information °The normal length of a pregnancy is 39-41 weeks. Preterm labor is when labor starts before 37 completed weeks of pregnancy. °What are the risk factors for preterm labor? °Preterm labor is more likely to occur in women who: °· Have certain infections during pregnancy such as a bladder infection, sexually transmitted infection, or infection inside the uterus (chorioamnionitis). °· Have a shorter-than-normal cervix. °· Have gone into preterm labor before. °· Have had surgery on their cervix. °· Are younger than age 17 or older than age 35. °· Are African American. °· Are pregnant with twins or multiple babies (multiple gestation). °· Take street drugs or smoke while pregnant. °· Do not gain enough weight while pregnant. °· Became pregnant shortly after having been pregnant. °What are the symptoms of preterm labor? °Symptoms of preterm labor include: °· Cramps similar to those that can happen during a menstrual period. The cramps may happen with diarrhea. °· Pain in the abdomen or lower back. °· Regular uterine contractions that may feel like tightening of the abdomen. °· A feeling of increased pressure in the pelvis. °· Increased watery or bloody mucus discharge from the vagina. °· Water breaking (ruptured amniotic sac). °Why is it important to recognize signs of preterm labor? °It is important to recognize signs of preterm labor because babies who are born prematurely may not be fully developed. This can put them at an increased risk for: °· Long-term (chronic) heart and lung problems. °· Difficulty immediately after birth with regulating body systems, including blood sugar, body temperature, heart rate, and breathing rate. °· Bleeding in the brain. °· Cerebral palsy. °· Learning difficulties. °· Death. °These risks are highest for babies who are born before 34 weeks of pregnancy. °How is preterm labor treated? °Treatment depends on the length of your pregnancy, your condition,  and the health of your baby. It may involve: °· Having a stitch (suture) placed in your cervix to prevent your cervix from opening too early (cerclage). °· Taking or being given medicines, such as: °¨ Hormone medicines. These may be given early in pregnancy to help support the pregnancy. °¨ Medicine to stop contractions. °¨ Medicines to help mature the baby’s lungs. These may be prescribed if the risk of delivery is high. °¨ Medicines to prevent your baby from developing cerebral palsy. °If the labor happens before 34 weeks of pregnancy, you may need to stay in the hospital. °What should I do if I think I am in preterm labor? °If you think that you are going into preterm labor, call your health care provider right away. °How can I prevent preterm labor in future pregnancies? °To increase your chance of having a full-term pregnancy: °· Do not use any tobacco products, such as cigarettes, chewing tobacco, and e-cigarettes. If you need help quitting, ask your health care provider. °· Do not use street drugs or medicines that have not been prescribed to you during your pregnancy. °· Talk with your health care provider before taking any herbal supplements, even if you have been taking them regularly. °· Make sure you gain a healthy amount of weight during your pregnancy. °· Watch for infection. If you think that you might have an infection, get it checked right away. °· Make sure to tell your health care provider if you have gone into preterm labor before. °This information is not intended to replace advice given to you by your health care provider. Make sure you discuss any questions you have with your   health care provider. °Document Released: 06/06/2003 Document Revised: 08/27/2015 Document Reviewed: 08/07/2015 °Elsevier Interactive Patient Education © 2017 [employer] ° °

## 2016-03-09 NOTE — MAU Note (Signed)
Took her nieces to the dr today, ever since then has been having pain in low back and abd.  (lifted them up and down from exam table).  Thought eating, drinking and resiting would help, then threw all that up.

## 2016-03-11 LAB — CULTURE, OB URINE

## 2016-03-25 ENCOUNTER — Inpatient Hospital Stay (HOSPITAL_COMMUNITY)
Admission: AD | Admit: 2016-03-25 | Discharge: 2016-03-25 | Disposition: A | Discharge status: Home / Self Care | Payer: Medicaid Other | Source: Ambulatory Visit | Attending: Obstetrics and Gynecology | Admitting: Obstetrics and Gynecology

## 2016-03-25 ENCOUNTER — Encounter (HOSPITAL_COMMUNITY): Payer: Self-pay | Admitting: *Deleted

## 2016-03-25 DIAGNOSIS — Z7982 Long term (current) use of aspirin: Secondary | ICD-10-CM | POA: Diagnosis not present

## 2016-03-25 DIAGNOSIS — Z79899 Other long term (current) drug therapy: Secondary | ICD-10-CM | POA: Insufficient documentation

## 2016-03-25 DIAGNOSIS — Z880 Allergy status to penicillin: Secondary | ICD-10-CM | POA: Insufficient documentation

## 2016-03-25 DIAGNOSIS — Z3A26 26 weeks gestation of pregnancy: Secondary | ICD-10-CM | POA: Insufficient documentation

## 2016-03-25 DIAGNOSIS — Z91040 Latex allergy status: Secondary | ICD-10-CM | POA: Diagnosis not present

## 2016-03-25 DIAGNOSIS — G43009 Migraine without aura, not intractable, without status migrainosus: Secondary | ICD-10-CM

## 2016-03-25 DIAGNOSIS — M7989 Other specified soft tissue disorders: Secondary | ICD-10-CM | POA: Diagnosis not present

## 2016-03-25 DIAGNOSIS — O26892 Other specified pregnancy related conditions, second trimester: Secondary | ICD-10-CM | POA: Diagnosis not present

## 2016-03-25 DIAGNOSIS — R51 Headache: Secondary | ICD-10-CM | POA: Insufficient documentation

## 2016-03-25 DIAGNOSIS — H538 Other visual disturbances: Secondary | ICD-10-CM | POA: Insufficient documentation

## 2016-03-25 DIAGNOSIS — Z91010 Allergy to peanuts: Secondary | ICD-10-CM | POA: Diagnosis not present

## 2016-03-25 DIAGNOSIS — F319 Bipolar disorder, unspecified: Secondary | ICD-10-CM | POA: Insufficient documentation

## 2016-03-25 DIAGNOSIS — O99343 Other mental disorders complicating pregnancy, third trimester: Secondary | ICD-10-CM | POA: Insufficient documentation

## 2016-03-25 LAB — URINALYSIS, ROUTINE W REFLEX MICROSCOPIC
BILIRUBIN URINE: NEGATIVE
Hgb urine dipstick: NEGATIVE
KETONES UR: NEGATIVE mg/dL
NITRITE: NEGATIVE
PH: 7 (ref 5.0–8.0)
Protein, ur: NEGATIVE mg/dL
SPECIFIC GRAVITY, URINE: 1.009 (ref 1.005–1.030)

## 2016-03-25 LAB — COMPREHENSIVE METABOLIC PANEL
ALK PHOS: 79 U/L (ref 38–126)
ALT: 9 U/L — AB (ref 14–54)
ANION GAP: 8 (ref 5–15)
AST: 48 U/L — ABNORMAL HIGH (ref 15–41)
Albumin: 3.2 g/dL — ABNORMAL LOW (ref 3.5–5.0)
BILIRUBIN TOTAL: 1.9 mg/dL — AB (ref 0.3–1.2)
BUN: 7 mg/dL (ref 6–20)
CALCIUM: 8.9 mg/dL (ref 8.9–10.3)
CO2: 19 mmol/L — AB (ref 22–32)
CREATININE: 0.64 mg/dL (ref 0.44–1.00)
Chloride: 106 mmol/L (ref 101–111)
Glucose, Bld: 64 mg/dL — ABNORMAL LOW (ref 65–99)
Potassium: 5.4 mmol/L — ABNORMAL HIGH (ref 3.5–5.1)
SODIUM: 133 mmol/L — AB (ref 135–145)
TOTAL PROTEIN: 6.5 g/dL (ref 6.5–8.1)

## 2016-03-25 LAB — GLUCOSE, CAPILLARY: Glucose-Capillary: 69 mg/dL (ref 65–99)

## 2016-03-25 LAB — CBC WITH DIFFERENTIAL/PLATELET
Basophils Absolute: 0 10*3/uL (ref 0.0–0.1)
Basophils Relative: 0 %
EOS ABS: 0.2 10*3/uL (ref 0.0–0.7)
Eosinophils Relative: 2 %
HCT: 32.6 % — ABNORMAL LOW (ref 36.0–46.0)
HEMOGLOBIN: 10.9 g/dL — AB (ref 12.0–15.0)
LYMPHS ABS: 1.9 10*3/uL (ref 0.7–4.0)
LYMPHS PCT: 20 %
MCH: 28.3 pg (ref 26.0–34.0)
MCHC: 33.4 g/dL (ref 30.0–36.0)
MCV: 84.7 fL (ref 78.0–100.0)
MONOS PCT: 4 %
Monocytes Absolute: 0.4 10*3/uL (ref 0.1–1.0)
NEUTROS PCT: 74 %
Neutro Abs: 7.3 10*3/uL (ref 1.7–7.7)
Platelets: 182 10*3/uL (ref 150–400)
RBC: 3.85 MIL/uL — AB (ref 3.87–5.11)
RDW: 12.3 % (ref 11.5–15.5)
WBC: 9.9 10*3/uL (ref 4.0–10.5)

## 2016-03-25 LAB — PROTEIN / CREATININE RATIO, URINE
CREATININE, URINE: 73 mg/dL
PROTEIN CREATININE RATIO: 0.19 mg/mg{creat} — AB (ref 0.00–0.15)
TOTAL PROTEIN, URINE: 14 mg/dL

## 2016-03-25 MED ORDER — DIPHENHYDRAMINE HCL 50 MG/ML IJ SOLN
25.0000 mg | Freq: Once | INTRAMUSCULAR | Status: AC
  Administered 2016-03-25: 25 mg via INTRAVENOUS
  Filled 2016-03-25: qty 1

## 2016-03-25 MED ORDER — DEXAMETHASONE SODIUM PHOSPHATE 10 MG/ML IJ SOLN
10.0000 mg | Freq: Once | INTRAMUSCULAR | Status: AC
  Administered 2016-03-25: 10 mg via INTRAVENOUS
  Filled 2016-03-25: qty 1

## 2016-03-25 MED ORDER — CYCLOBENZAPRINE HCL 10 MG PO TABS: 10.0000 mg | ORAL_TABLET | Freq: Two times a day (BID) | ORAL | 0 refills | Status: DC | PRN

## 2016-03-25 MED ORDER — METOCLOPRAMIDE HCL 5 MG/ML IJ SOLN
10.0000 mg | Freq: Once | INTRAMUSCULAR | Status: AC
  Administered 2016-03-25: 10 mg via INTRAVENOUS
  Filled 2016-03-25: qty 2

## 2016-03-25 MED ORDER — LACTATED RINGERS IV BOLUS (SEPSIS)
1000.0000 mL | Freq: Once | INTRAVENOUS | Status: AC
  Administered 2016-03-25: 1000 mL via INTRAVENOUS

## 2016-03-25 NOTE — MAU Note (Signed)
Patient resting and states she feels better. Rates HA pain 6/10, down from 9.

## 2016-03-25 NOTE — MAU Note (Signed)
Has a headache for the past 2 days, took E.S. Tylenol and Fioricet, neither is helping. Was seeing stars, now is having blurring. Denies epigastric pain.  Reports swelling in hands and feet.

## 2016-03-25 NOTE — MAU Provider Note (Signed)
[facility] Provider Note   CSN: [id number] Arrival date & time: 03/25/16  1352     History   Chief Complaint Chief Complaint  Patient presents with  . Headache  . Blurred Vision  . Leg Swelling    HPI Sharon Townsend is a 25 y.o. [phone] @ [redacted]w[redacted]d gestation who presents to the MAU with complaint of headache that started yesterday. She reports associated symptoms of blurry vision. Patient states she took ES tylenol without relief. She then took Fioricet  Without relief. She reports vomiting x4 which she contributes to the pain. Patient states that early in the pregnancy she had headaches and was treated with the Fioricet at that time. Patient also reports swelling of hands and feet since last night.   The history is provided by the patient. No language interpreter was used.  Headache  The primary symptoms include headaches, visual change and vomiting. Primary symptoms do not include loss of consciousness, seizures, focal weakness, speech change, fever or nausea. The symptoms began yesterday. The symptoms are worsening. The symptoms occurred during pregnancy.  The headache began yesterday. The pain from the headache is at a severity of 9/10. Location/region(s) of the headache: frontal. The headache is associated with visual change. The headache is not associated with neck stiffness.  Additional symptoms do not include neck stiffness.    Past Medical History:  Diagnosis Date  . Bipolar disorder (HCC)   . Chronic constipation   . Gall bladder stones   . Renal disorder    two tubes in left kidney going to bladder    Patient Active Problem List   Diagnosis Date Noted  . Chronic cholecystitis with calculus 10/03/2014  . NVD (normal vaginal delivery) 08/03/2014  . Hypertension in pregnancy, antepartum 08/02/2014  . [redacted] weeks gestation of pregnancy   . Encounter for fetal anatomic survey   . Severe pre-eclampsia in third trimester     Past Surgical History:  Procedure  Laterality Date  . CHOLECYSTECTOMY    . MANDIBLE SURGERY    . renal stents    . TONSILLECTOMY      OB History    Gravida Para Term Preterm AB Living   4 2   2 1 1    SAB TAB Ectopic Multiple Live Births   1     0 1       Home Medications    Prior to Admission medications   Medication Sig Start Date End Date Taking? Authorizing Provider  acetaminophen (TYLENOL) 500 MG tablet Take 500 mg by mouth every 6 (six) hours as needed for mild pain.   Yes Historical Provider, MD  aspirin EC 81 MG tablet Take 81 mg by mouth daily.   Yes Historical Provider, MD  butalbital-acetaminophen-caffeine (FIORICET) 50-325-40 MG tablet Take 1-2 tablets by mouth every 6 (six) hours as needed for headache. 01/06/16 01/05/17 Yes Dorathy Kinsman, CNM  Prenatal Vit-Fe Fumarate-FA (PRENATAL MULTIVITAMIN) TABS tablet Take 1 tablet by mouth daily at 12 noon.   Yes Historical Provider, MD  promethazine (PHENERGAN) 25 MG tablet Take 1 tablet (25 mg total) by mouth every 6 (six) hours as needed for nausea or vomiting. 01/06/16  Yes Dorathy Kinsman, CNM  Iron-FA-B Cmp-C-Biot-Probiotic (FUSION PLUS) CAPS Take 1 capsule by mouth daily before breakfast. Patient not taking: Reported on 03/25/2016 08/05/14   Brock Bad, MD  ondansetron (ZOFRAN ODT) 4 MG disintegrating tablet Take 1 tablet (4 mg total) by mouth every 8 (eight) hours as needed for nausea or  vomiting. Patient not taking: Reported on 03/25/2016 03/09/16   [name], NP    Family History Family History  Problem Relation Age of Onset  . Diabetes Mother   . Diabetes Father   . Hypertension Father     Social History Social History  Substance Use Topics  . Smoking status: Never Smoker  . Smokeless tobacco: Never Used  . Alcohol use No     Comment: before preg     Allergies   Peanuts [peanut oil]; Latex; and Penicillins   Review of Systems Review of Systems  Constitutional: Negative for chills and fever.  HENT: Negative.   Eyes: Positive  for visual disturbance.  Respiratory: Negative for cough, wheezing and stridor.   Cardiovascular: Negative for chest pain.  Gastrointestinal: Positive for vomiting. Negative for nausea.  Genitourinary: Negative for dysuria, frequency, urgency, vaginal bleeding and vaginal discharge.  Musculoskeletal: Negative for back pain and neck stiffness.  Skin: Negative for rash.  Neurological: Positive for headaches. Negative for speech change, focal weakness, seizures and loss of consciousness.  Psychiatric/Behavioral: Negative for confusion. The patient is not nervous/anxious.      Physical Exam Updated Vital Signs BP 116/67   Pulse 115   Temp 98.2 F (36.8 C) (Oral)   Resp 16   Wt 177 lb 3.2 oz (80.4 kg)   LMP 09/24/2015 Comment: Needs pregnancy test  BMI 34.61 kg/m   Physical Exam  Constitutional: She is oriented to person, place, and time. She appears well-developed and well-nourished. No distress.  HENT:  Head: Normocephalic and atraumatic.  Right Ear: Tympanic membrane and external ear normal.  Left Ear: Tympanic membrane normal.  Nose: Nose normal.  Mouth/Throat: Uvula is midline, oropharynx is clear and moist and mucous membranes are normal.  Frontal headache  Eyes: Conjunctivae and EOM are normal. Pupils are equal, round, and reactive to light.  Neck: Trachea normal and normal range of motion. Neck supple.  No meningeal signs.  Cardiovascular: Normal rate, regular rhythm and intact distal pulses.   Pulmonary/Chest: Effort normal. She has no wheezes. She has no rales.  Abdominal: Soft. Bowel sounds are normal. There is no tenderness.  Gravid, consistent with dates  Musculoskeletal: Normal range of motion. She exhibits no edema or deformity.  Radial and pedal pulses strong, adequate circulation, good touch sensation. Minimal edema of hands noted, no edema of lower extremities appreciated.   Neurological: She is alert and oriented to person, place, and time. She has normal  strength. No cranial nerve deficit or sensory deficit. She displays a negative Romberg sign. Gait normal. GCS eye subscore is 4. GCS verbal subscore is 5. GCS motor subscore is 6.  Reflex Scores:      Bicep reflexes are 2+ on the right side and 3+ on the left side.      Brachioradialis reflexes are 2+ on the right side and 3+ on the left side.      Patellar reflexes are 2+ on the right side and 3+ on the left side.      Achilles reflexes are 2+ on the right side and 2+ on the left side. Rapid alternating movement without difficulty. Stands on one foot without difficulty.  Skin: Skin is warm and dry.  Psychiatric: She has a normal mood and affect. Her behavior is normal. Thought content normal.  Nursing note and vitals reviewed.    ED Treatments / Results  Labs (all labs ordered are listed, but only abnormal results are displayed) Labs Reviewed  URINALYSIS, ROUTINE W  REFLEX MICROSCOPIC - Abnormal; Notable for the following:       Result Value   APPearance HAZY (*)    Glucose, UA >=500 (*)    Leukocytes, UA LARGE (*)    Bacteria, UA RARE (*)    Squamous Epithelial / LPF 0-5 (*)    Non Squamous Epithelial 6-30 (*)    All other components within normal limits    Radiology No results found.  Procedures Procedures (including critical care time)  Medications Ordered in ED Medications - No data to display   Initial Impression / Assessment and Plan / ED Course  I have reviewed the triage vital signs and the nursing notes. Clinical Course   25 y.o. [id number] @ 2065w1d with headache. Care turned over to Dr. Jolayne Pantheronstant @ 5pm. IV fluids infusing and patient states she is feeling some better.   Patient evaluated at 5:40pm. She reports feeling better.  Discharge patient with diagnosis of migraine headache in pregnancy. Labs reviewed and were normal. BP normal throughout her stay. No evidence of preeclampsia NST appropriate for a [redacted] weeks GA  Advised to continue Fioricet as previously  prescribed Rx for Flexeril also provided Follow up as scheduled for routine prenatal care  Attestation of Attending Supervision of Advanced Practitioner (CNM/NP): Evaluation and management procedures were performed by the Advanced Practitioner under my supervision and collaboration.  I have reviewed the Advanced Practitioner's note and chart, and I agree with the management and plan.  Deunte Bledsoe 03/25/2016 5:44 PM   New Prescriptions Current Discharge Medication List

## 2016-03-25 NOTE — Discharge Instructions (Signed)
Migraine Headache A migraine headache is an intense, throbbing pain on one side or both sides of the head. Migraines may also cause other symptoms, such as nausea, vomiting, and sensitivity to light and noise. What are the causes? Doing or taking certain things may also trigger migraines, such as:  Alcohol.  Smoking.  Medicines, such as: ? Medicine used to treat chest pain (nitroglycerine). ? Birth control pills. ? Estrogen pills. ? Certain blood pressure medicines.  Aged cheeses, chocolate, or caffeine.  Foods or drinks that contain nitrates, glutamate, aspartame, or tyramine.  Physical activity.  Other things that may trigger a migraine include:  Menstruation.  Pregnancy.  Hunger.  Stress, lack of sleep, too much sleep, or fatigue.  Weather changes.  What increases the risk? The following factors may make you more likely to experience migraine headaches:  Age. Risk increases with age.  Family history of migraine headaches.  Being Caucasian.  Depression and anxiety.  Obesity.  Being a woman.  Having a hole in the heart (patent foramen ovale) or other heart problems.  What are the signs or symptoms? The main symptom of this condition is pulsating or throbbing pain. Pain may:  Happen in any area of the head, such as on one side or both sides.  Interfere with daily activities.  Get worse with physical activity.  Get worse with exposure to bright lights or loud noises.  Other symptoms may include:  Nausea.  Vomiting.  Dizziness.  General sensitivity to bright lights, loud noises, or smells.  Before you get a migraine, you may get warning signs that a migraine is developing (aura). An aura may include:  Seeing flashing lights or having blind spots.  Seeing bright spots, halos, or zigzag lines.  Having tunnel vision or blurred vision.  Having numbness or a tingling feeling.  Having trouble talking.  Having muscle weakness.  How is this  diagnosed? A migraine headache can be diagnosed based on:  Your symptoms.  A physical exam.  Tests, such as CT scan or MRI of the head. These imaging tests can help rule out other causes of headaches.  Taking fluid from the spine (lumbar puncture) and analyzing it (cerebrospinal fluid analysis, or CSF analysis).  How is this treated? A migraine headache is usually treated with medicines that:  Relieve pain.  Relieve nausea.  Prevent migraines from coming back.  Treatment may also include:  Acupuncture.  Lifestyle changes like avoiding foods that trigger migraines.  Follow these instructions at home: Medicines  Take over-the-counter and prescription medicines only as told by your health care provider.  Do not drive or use heavy machinery while taking prescription pain medicine.  To prevent or treat constipation while you are taking prescription pain medicine, your health care provider may recommend that you: ? Drink enough fluid to keep your urine clear or pale yellow. ? Take over-the-counter or prescription medicines. ? Eat foods that are high in fiber, such as fresh fruits and vegetables, whole grains, and beans. ? Limit foods that are high in fat and processed sugars, such as fried and sweet foods. Lifestyle  Avoid alcohol use.  Do not use any products that contain nicotine or tobacco, such as cigarettes and e-cigarettes. If you need help quitting, ask your health care provider.  Get at least 8 hours of sleep every night.  Limit your stress. General instructions   Keep a journal to find out what may trigger your migraine headaches. For example, write down: ? What you eat and   drink. ? How much sleep you get. ? Any change to your diet or medicines.  If you have a migraine: ? Avoid things that make your symptoms worse, such as bright lights. ? It may help to lie down in a dark, quiet room. ? Do not drive or use heavy machinery. ? Ask your health care provider  what activities are safe for you while you are experiencing symptoms.  Keep all follow-up visits as told by your health care provider. This is important. Contact a health care provider if:  You develop symptoms that are different or more severe than your usual migraine symptoms. Get help right away if:  Your migraine becomes severe.  You have a fever.  You have a stiff neck.  You have vision loss.  Your muscles feel weak or like you cannot control them.  You start to lose your balance often.  You develop trouble walking.  You faint. This information is not intended to replace advice given to you by your health care provider. Make sure you discuss any questions you have with your health care provider. Document Released: 03/16/2005 Document Revised: 10/04/2015 Document Reviewed: 09/02/2015 Elsevier Interactive Patient Education  2017 [employer]   

## 2016-04-05 ENCOUNTER — Encounter (HOSPITAL_COMMUNITY): Payer: Self-pay

## 2016-04-05 ENCOUNTER — Inpatient Hospital Stay (HOSPITAL_COMMUNITY)
Admission: AD | Admit: 2016-04-05 | Discharge: 2016-04-05 | Disposition: A | Discharge status: Home / Self Care | Payer: Medicaid Other | Source: Ambulatory Visit | Attending: Obstetrics and Gynecology | Admitting: Obstetrics and Gynecology

## 2016-04-05 DIAGNOSIS — O99342 Other mental disorders complicating pregnancy, second trimester: Secondary | ICD-10-CM | POA: Insufficient documentation

## 2016-04-05 DIAGNOSIS — O26892 Other specified pregnancy related conditions, second trimester: Secondary | ICD-10-CM | POA: Insufficient documentation

## 2016-04-05 DIAGNOSIS — Z7982 Long term (current) use of aspirin: Secondary | ICD-10-CM | POA: Insufficient documentation

## 2016-04-05 DIAGNOSIS — Z3A27 27 weeks gestation of pregnancy: Secondary | ICD-10-CM | POA: Diagnosis not present

## 2016-04-05 DIAGNOSIS — Z880 Allergy status to penicillin: Secondary | ICD-10-CM | POA: Insufficient documentation

## 2016-04-05 DIAGNOSIS — F319 Bipolar disorder, unspecified: Secondary | ICD-10-CM | POA: Insufficient documentation

## 2016-04-05 DIAGNOSIS — N898 Other specified noninflammatory disorders of vagina: Secondary | ICD-10-CM | POA: Insufficient documentation

## 2016-04-05 LAB — URINALYSIS, ROUTINE W REFLEX MICROSCOPIC
BILIRUBIN URINE: NEGATIVE
Hgb urine dipstick: NEGATIVE
Ketones, ur: NEGATIVE mg/dL
NITRITE: NEGATIVE
Protein, ur: NEGATIVE mg/dL
SPECIFIC GRAVITY, URINE: 1.002 — AB (ref 1.005–1.030)
pH: 6 (ref 5.0–8.0)

## 2016-04-05 LAB — WET PREP, GENITAL
Clue Cells Wet Prep HPF POC: NONE SEEN
SPERM: NONE SEEN
Trich, Wet Prep: NONE SEEN
Yeast Wet Prep HPF POC: NONE SEEN

## 2016-04-05 LAB — GLUCOSE, CAPILLARY: Glucose-Capillary: 111 mg/dL — ABNORMAL HIGH (ref 65–99)

## 2016-04-05 LAB — AMNISURE RUPTURE OF MEMBRANE (ROM) NOT AT ARMC: Amnisure ROM: NEGATIVE

## 2016-04-05 LAB — POCT FERN TEST: POCT FERN TEST: NEGATIVE

## 2016-04-05 NOTE — MAU Provider Note (Signed)
History   [id number]   Chief Complaint  Patient presents with  . Rupture of Membranes    HPI Sharon Townsend is a 26 y.o. female  [phone] at [redacted]w[redacted]d IUP here with report of gush of clear fluid at 2200 yesterday.  Reports continued leaking throughout the day.  +cramping that started this morning and has continued.  Denies feeling contractions or having vaginal bleeding.  Fetal movements feel less than usual per patient.  Movements have increased since arrival to the unit.    Patient's last menstrual period was 09/24/2015.  OB History  Gravida Para Term Preterm AB Living  4 2   2 1 1   SAB TAB Ectopic Multiple Live Births  1     0 1    # Outcome Date GA Lbr Len/2nd Weight Sex Delivery Anes PTL Lv  4 Current           3 Preterm 08/03/14 [redacted]w[redacted]d 09:30 / 00:21 4 lb 7.1 oz (2.016 kg) M Vag-Spont EPI  LIV     Birth Comments: cleft lip  2 SAB 2011 [redacted]w[redacted]d         1 Preterm 2009 [redacted]w[redacted]d   F Vag-Spont  N FD      Past Medical History:  Diagnosis Date  . Bipolar disorder (HCC)   . Chronic constipation   . Gall bladder stones   . Renal disorder    two tubes in left kidney going to bladder    Family History  Problem Relation Age of Onset  . Diabetes Mother   . Diabetes Father   . Hypertension Father     Social History   Social History  . Marital status: Single    Spouse name: N/A  . Number of children: N/A  . Years of education: N/A   Social History Main Topics  . Smoking status: Never Smoker  . Smokeless tobacco: Never Used  . Alcohol use No     Comment: before preg  . Drug use: No  . Sexual activity: Yes    Birth control/ protection: None   Other Topics Concern  . None   Social History Narrative  . None    Allergies  Allergen Reactions  . Peanuts [Peanut Oil] Anaphylaxis  . Latex Swelling  . Penicillins Other (See Comments)    Reaction:  Pt states that it "shuts down her bowels and cannot urinate" Has patient had a PCN reaction causing immediate rash,  facial/tongue/throat swelling, SOB or lightheadedness with hypotension: No Has patient had a PCN reaction causing severe rash involving mucus membranes or skin necrosis: No Has patient had a PCN reaction that required hospitalization No Has patient had a PCN reaction occurring within the last 10 years: No If all of the above answers are "NO", then may proceed with Cephalosp    No current facility-administered medications on file prior to encounter.    Current Outpatient Prescriptions on File Prior to Encounter  Medication Sig Dispense Refill  . acetaminophen (TYLENOL) 500 MG tablet Take 500 mg by mouth every 6 (six) hours as needed for mild pain.    Marland Kitchen aspirin EC 81 MG tablet Take 81 mg by mouth daily.    . butalbital-acetaminophen-caffeine (FIORICET) 50-325-40 MG tablet Take 1-2 tablets by mouth every 6 (six) hours as needed for headache. 30 tablet 1  . cyclobenzaprine (FLEXERIL) 10 MG tablet Take 1 tablet (10 mg total) by mouth 2 (two) times daily as needed for muscle spasms. 20 tablet 0  . Iron-FA-B Cmp-C-Biot-Probiotic (  FUSION PLUS) CAPS Take 1 capsule by mouth daily before breakfast. (Patient not taking: Reported on 03/25/2016) 30 capsule 5  . ondansetron (ZOFRAN ODT) 4 MG disintegrating tablet Take 1 tablet (4 mg total) by mouth every 8 (eight) hours as needed for nausea or vomiting. (Patient not taking: Reported on 03/25/2016) 15 tablet 0  . Prenatal Vit-Fe Fumarate-FA (PRENATAL MULTIVITAMIN) TABS tablet Take 1 tablet by mouth daily at 12 noon.    . promethazine (PHENERGAN) 25 MG tablet Take 1 tablet (25 mg total) by mouth every 6 (six) hours as needed for nausea or vomiting. 30 tablet 1     Review of Systems  Constitutional: Negative for chills and fever.  Genitourinary: Positive for pelvic pain (cramping) and vaginal discharge. Negative for dysuria, vaginal bleeding and vaginal pain.     Physical Exam   Vitals:   04/05/16 2021  BP: 127/71  Pulse: 112  Resp: 17  Temp: 98.9  F (37.2 C)  TempSrc: Oral  SpO2: 98%  Weight: 181 lb (82.1 kg)  Height: 5\' 1"  (1.549 m)    Physical Exam  Constitutional: She is oriented to person, place, and time. She appears well-developed and well-nourished. No distress.  HENT:  Head: Normocephalic.  Neck: Normal range of motion. Neck supple.  Cardiovascular: Normal rate, regular rhythm and normal heart sounds.   Respiratory: Effort normal and breath sounds normal.  GI: Soft. There is no tenderness.  Genitourinary: No bleeding in the vagina. Vaginal discharge found.  Genitourinary Comments: Pooling negative; ferning negative; glistening appearance to cervix  Neurological: She is alert and oriented to person, place, and time.  Skin: Skin is warm and dry.   Dilation: Closed Effacement (%): Thick Exam by:: Margarita MailW. Karim, CNM  FHR 147, 10x10 accel Toco - none MAU Course  Procedures  MDM Results for orders placed or performed during the hospital encounter of 04/05/16 (from the past 24 hour(s))  Urinalysis, Routine w reflex microscopic     Status: Abnormal   Collection Time: 04/05/16  8:11 PM  Result Value Ref Range   Color, Urine YELLOW YELLOW   APPearance HAZY (A) CLEAR   Specific Gravity, Urine 1.002 (L) 1.005 - 1.030   pH 6.0 5.0 - 8.0   Glucose, UA >=500 (A) NEGATIVE mg/dL   Hgb urine dipstick NEGATIVE NEGATIVE   Bilirubin Urine NEGATIVE NEGATIVE   Ketones, ur NEGATIVE NEGATIVE mg/dL   Protein, ur NEGATIVE NEGATIVE mg/dL   Nitrite NEGATIVE NEGATIVE   Leukocytes, UA MODERATE (A) NEGATIVE   RBC / HPF 0-5 0 - 5 RBC/hpf   WBC, UA 6-30 0 - 5 WBC/hpf   Bacteria, UA RARE (A) NONE SEEN   Squamous Epithelial / LPF 0-5 (A) NONE SEEN  Wet prep, genital     Status: Abnormal   Collection Time: 04/05/16  8:39 PM  Result Value Ref Range   Yeast Wet Prep HPF POC NONE SEEN NONE SEEN   Trich, Wet Prep NONE SEEN NONE SEEN   Clue Cells Wet Prep HPF POC NONE SEEN NONE SEEN   WBC, Wet Prep HPF POC FEW (A) NONE SEEN   Sperm NONE  SEEN   Amnisure rupture of membrane (rom)[facility]     Status: None   Collection Time: 04/05/16  8:51 PM  Result Value Ref Range   Amnisure ROM NEGATIVE   Fern Test     Status: None   Collection Time: 04/05/16  8:55 PM  Result Value Ref Range   POCT Fern Test Negative =  intact amniotic membranes   Glucose, capillary     Status: Abnormal   Collection Time: 04/05/16  9:20 PM  Result Value Ref Range   Glucose-Capillary 111 (H) 65 - 99 mg/dL   [id number] Dr. Tenny Craw notified and given report of HPI/exam/labs > discharge home with follow-up in office  Assessment and Plan  25 y.o. [id number] at [redacted]w[redacted]d IUP  Vaginal Discharge in Pregnancy - Normal exam  Plan: Discharge home Keep scheduled appointment  Reviewed pregnancy precautions  Marlis Edelson, CNM 04/05/2016 9:47 PM

## 2016-04-05 NOTE — MAU Note (Signed)
Pt states she felt a gush of clear fluid at 10pm yesterday. Pt states the leaking has continued throughout the day today. Pt states she started feeling some cramping this morning that has continued throughout the day. Pt states the baby hasn't been moving as much as normal today. Pt denies bleeding.

## 2016-04-11 ENCOUNTER — Inpatient Hospital Stay (HOSPITAL_COMMUNITY)
Admission: AD | Admit: 2016-04-11 | Discharge: 2016-04-11 | Disposition: A | Discharge status: Home / Self Care | Payer: Medicaid Other | Source: Ambulatory Visit | Attending: Obstetrics & Gynecology | Admitting: Obstetrics & Gynecology

## 2016-04-11 ENCOUNTER — Encounter (HOSPITAL_COMMUNITY): Payer: Self-pay | Admitting: *Deleted

## 2016-04-11 DIAGNOSIS — Z91010 Allergy to peanuts: Secondary | ICD-10-CM | POA: Insufficient documentation

## 2016-04-11 DIAGNOSIS — R221 Localized swelling, mass and lump, neck: Secondary | ICD-10-CM | POA: Diagnosis not present

## 2016-04-11 DIAGNOSIS — T781XXA Other adverse food reactions, not elsewhere classified, initial encounter: Secondary | ICD-10-CM | POA: Diagnosis not present

## 2016-04-11 DIAGNOSIS — X58XXXA Exposure to other specified factors, initial encounter: Secondary | ICD-10-CM | POA: Insufficient documentation

## 2016-04-11 DIAGNOSIS — T7840XA Allergy, unspecified, initial encounter: Secondary | ICD-10-CM | POA: Diagnosis present

## 2016-04-11 DIAGNOSIS — O9A213 Injury, poisoning and certain other consequences of external causes complicating pregnancy, third trimester: Secondary | ICD-10-CM | POA: Diagnosis not present

## 2016-04-11 DIAGNOSIS — T7801XD Anaphylactic reaction due to peanuts, subsequent encounter: Secondary | ICD-10-CM | POA: Diagnosis not present

## 2016-04-11 DIAGNOSIS — Z3A28 28 weeks gestation of pregnancy: Secondary | ICD-10-CM | POA: Diagnosis not present

## 2016-04-11 DIAGNOSIS — T7840XD Allergy, unspecified, subsequent encounter: Secondary | ICD-10-CM

## 2016-04-11 MED ORDER — EPINEPHRINE 0.3 MG/0.3ML IJ SOAJ
0.3000 mg | Freq: Once | INTRAMUSCULAR | Status: DC
  Administered 2016-04-11: 0.3 mg via INTRAMUSCULAR

## 2016-04-11 MED ORDER — EPINEPHRINE 0.3 MG/0.3ML IJ SOAJ: 0.3000 mg | Freq: Once | INTRAMUSCULAR | Status: DC

## 2016-04-11 MED ORDER — EPINEPHRINE PF 1 MG/ML IJ SOLN
1.0000 mg | Freq: Once | INTRAMUSCULAR | Status: DC
  Filled 2016-04-11: qty 1

## 2016-04-11 MED ORDER — DIPHENHYDRAMINE HCL 50 MG/ML IJ SOLN
INTRAMUSCULAR | Status: AC
  Filled 2016-04-11: qty 1

## 2016-04-11 NOTE — MAU Note (Addendum)
Patient presents to mau for allergic reaction to peanuts; states she was eating at [employer] and her son who was eating peanuts shared her drink. States hives on arms, tightness in throat, and itchy face and lips.

## 2016-04-11 NOTE — Discharge Instructions (Signed)
Anaphylactic Reaction An anaphylactic reaction (anaphylaxis) is a sudden allergic reaction that is very bad (severe). It also affects more than one part of the body. This condition can be life-threatening. If you have an anaphylactic reaction, you need to get medical help right away. You may need to stay in the hospital. Your doctor may teach you how to use an allergy kit (anaphylaxis kit) and how to give yourself an allergy shot (epinephrine injection). You can give yourself an allergy shot with what is commonly called an auto-injector "pen." Symptoms of an anaphylactic reaction may include:  A stuffy nose (nasal congestion).  Headache.  Tingling in your mouth.  A flushed face.  An itchy, red rash.  Swelling of your eyes, lips, face, or tongue.  Swelling of the back of your mouth and your throat.  Breathing loudly (wheezing).  A hoarse voice.  Itchy, red, swollen areas of skin (hives).  Dizziness or light-headedness.  Passing out (fainting).  Feeling worried or nervous (anxiety).  Feeling confused.  Pain in your belly (abdomen) or chest.  Trouble with breathing, talking, or swallowing.  A tight feeling in your chest or throat.  Fast or uneven heartbeats (palpitations).  Throwing up (vomiting).  Watery poop (diarrhea).  Follow these instructions at home: Safety  Always keep an auto-injector pen or your allergy kit with you. These could save your life. Use them as told by your doctor.  Do not drive until your doctor says that it is safe.  Make sure that you, the people who live with you, and your employer know: ? How to use your allergy kit. ? How to use an auto-injector pen to give you an allergy shot.  If you used your auto-injector pen: ? Get more medicine for it right away. This is important in case you have another reaction. ? Get help right away.  Wear a bracelet or necklace that says you have an allergy, if your doctor tells you to do this.  Learn  the signs of a very bad allergic reaction.  Work with your doctors to make a plan for what to do if you have a very bad allergic reaction. Being prepared is important. General instructions  Take over-the-counter and prescription medicines only as told by your doctor.  If you have itchy, red, swollen areas of skin or a rash: ? Use over-the-counter medicine (antihistamine) as told by your doctor. ? Put cold, wet cloths (cold compresses) on your skin. ? Take baths or showers in cool water. Avoid hot water.  If you had tests done, it is up to you to get your test results. Ask your doctor when your results will be ready.  Tell any doctors who care for you that you have an allergy.  Keep all follow-up visits as told by your doctor. This is important. How is this prevented?  Avoid things (allergens) that gave you a very bad allergic reaction before.  If you have a food allergy and you go to a restaurant, tell your server about your allergy. If you are not sure if your meal was made with food that you are allergic to, ask your server before you eat it. Contact a doctor if:  You have symptoms of an allergic reaction. You may notice them soon after being around whatever it is that you are allergic to. Symptoms may include: ? A rash. ? A headache. ? Sneezing or a runny nose. ? Swelling. ? Feeling sick to your stomach. ? Watery poop. Get help right   away if:  You had to use your auto-injector pen. You must go to the emergency room even if the medicine seems to be working.  You have any of these: ? A tight feeling in your chest or your throat. ? Loud breathing. ? Trouble with breathing. ? Itchy, red, swollen areas of skin. ? Red skin or itching all over your body. ? Swelling in your lips, tongue, or the back of your throat.  You have throwing up that gets very bad.  You have watery poop that gets very bad.  You pass out or feel like you might pass out. These symptoms may be an  emergency. Do not wait to see if the symptoms will go away. Use your auto-injector pen or allergy kit as you have been told. Get medical help right away. Call your local emergency services (911 in the [place] Do not drive yourself to the hospital. Summary  An anaphylactic reaction (anaphylaxis) is a sudden allergic reaction that is very bad (severe).  This condition can be life-threatening. If you have an anaphylactic reaction, you need to get medical help right away.  Your doctor may teach you how to use an allergy kit (anaphylaxis kit) and how to give yourself an allergy shot (epinephrine injection) with an auto-injector "pen."  Always keep an auto-injector pen or your allergy kit with you. These could save your life. Use them as told by your doctor.  If you had to use your auto-injector pen, you must go to the emergency room even if the medicine seems to be working. This information is not intended to replace advice given to you by your health care provider. Make sure you discuss any questions you have with your health care provider. Document Released: 09/02/2007 Document Revised: 11/08/2015 Document Reviewed: 11/08/2015 Elsevier Interactive Patient Education  2017 [employer]  

## 2016-04-11 NOTE — MAU Provider Note (Signed)
History     CSN: [id number]  Arrival date and time: 04/11/16 1823   None     Chief Complaint  Patient presents with  . Allergic Reaction   HPI   [name] is a 26 y.o. female [phone] @ [redacted]w[redacted]d with a history of a severe peanut allergy here in MAU with an allergic reaction.  She was in her normal state of health up until she ate dinner at [employer] this evening. Shortly after eating she felt her throat become tight and her skin became itchy. She does not have an epipen at home because she cannot afford the cost. She has had a peanut allergy since childhood.   + fetal movements She denies vaginal bleeding or leaking of fluid.   OB History    Gravida Para Term Preterm AB Living   4 2   2 1 1    SAB TAB Ectopic Multiple Live Births   1     0 1      Past Medical History:  Diagnosis Date  . Bipolar disorder (HCC)   . Chronic constipation   . Gall bladder stones   . Renal disorder    two tubes in left kidney going to bladder    Past Surgical History:  Procedure Laterality Date  . CHOLECYSTECTOMY    . MANDIBLE SURGERY    . renal stents    . TONSILLECTOMY      Family History  Problem Relation Age of Onset  . Diabetes Mother   . Diabetes Father   . Hypertension Father     Social History  Substance Use Topics  . Smoking status: Never Smoker  . Smokeless tobacco: Never Used  . Alcohol use No     Comment: before preg    Allergies:  Allergies  Allergen Reactions  . Peanuts [Peanut Oil] Anaphylaxis  . Latex Swelling  . Penicillins Other (See Comments)    Reaction:  Pt states that it "shuts down her bowels and cannot urinate" Has patient had a PCN reaction causing immediate rash, facial/tongue/throat swelling, SOB or lightheadedness with hypotension: No Has patient had a PCN reaction causing severe rash involving mucus membranes or skin necrosis: No Has patient had a PCN reaction that required hospitalization No Has patient had a PCN reaction  occurring within the last 10 years: No If all of the above answers are "NO", then may proceed with Cephalosp    Prescriptions Prior to Admission  Medication Sig Dispense Refill Last Dose  . acetaminophen (TYLENOL) 500 MG tablet Take 500 mg by mouth every 6 (six) hours as needed for mild pain.   03/25/2016 at Unknown time  . aspirin EC 81 MG tablet Take 81 mg by mouth daily.   03/25/2016 at Unknown time  . butalbital-acetaminophen-caffeine (FIORICET) 50-325-40 MG tablet Take 1-2 tablets by mouth every 6 (six) hours as needed for headache. 30 tablet 1 03/25/2016 at Unknown time  . cyclobenzaprine (FLEXERIL) 10 MG tablet Take 1 tablet (10 mg total) by mouth 2 (two) times daily as needed for muscle spasms. 20 tablet 0   . Iron-FA-B Cmp-C-Biot-Probiotic (FUSION PLUS) CAPS Take 1 capsule by mouth daily before breakfast. (Patient not taking: Reported on 03/25/2016) 30 capsule 5 Not Taking at Unknown time  . Prenatal Vit-Fe Fumarate-FA (PRENATAL MULTIVITAMIN) TABS tablet Take 1 tablet by mouth daily at 12 noon.   03/25/2016 at Unknown time  . promethazine (PHENERGAN) 25 MG tablet Take 1 tablet (25 mg total) by mouth every 6 (  six) hours as needed for nausea or vomiting. 30 tablet 1 03/24/2016 at Unknown time   No results found for this or any previous visit (from the past 48 hour(s)).  Review of Systems  HENT: Positive for trouble swallowing.   Respiratory: Negative for chest tightness, shortness of breath, wheezing and stridor.   Gastrointestinal: Negative for abdominal pain.  Genitourinary: Negative for vaginal bleeding.   Physical Exam   Blood pressure 119/59, pulse 115, temperature 97.7 F (36.5 C), temperature source Oral, resp. rate 18, weight 182 lb (82.6 kg), last menstrual period 09/24/2015, SpO2 99 %, currently breastfeeding.  Physical Exam  Constitutional: She is oriented to person, place, and time. Vital signs are normal. She appears well-developed and well-nourished.  Non-toxic  appearance. She does not have a sickly appearance. She does not appear ill. No distress.  HENT:  Head: Normocephalic.  Mouth/Throat: No oropharyngeal exudate, posterior oropharyngeal edema, posterior oropharyngeal erythema or tonsillar abscesses.  Eyes: Pupils are equal, round, and reactive to light.  Respiratory: Effort normal and breath sounds normal. No respiratory distress. She has no wheezes. She has no rales. She exhibits no tenderness.  Musculoskeletal: Normal range of motion.  Neurological: She is alert and oriented to person, place, and time.  Skin: Skin is warm. She is not diaphoretic.  Psychiatric: Her behavior is normal.   Fetal Tracing: Baseline: 135 bpm  Variability: Moderate  Accelerations: 15x15 Decelerations: none Toco: quiet    Procedures  None  MDM  Discussed patient with Dr. Mora ApplPinn. Prior to DC  Pulse ox 99%-100% on RA Epi 0.3 mg IM: symptoms resolved. Patient declines throat tightness at this time   Assessment and Plan   A:  1. Allergic reaction, subsequent encounter     P:  Discharge home in stable condition  Strict return precautions Rx: epipen  Return to MAU if symptoms worsen  Duane LopeJennifer I Aneita Kiger, NP 04/11/2016 7:50 PM

## 2016-04-11 NOTE — MAU Note (Signed)
Patient states she is feeling better. NP made aware.

## 2016-04-12 ENCOUNTER — Inpatient Hospital Stay (HOSPITAL_COMMUNITY)
Admission: AD | Admit: 2016-04-12 | Discharge: 2016-04-12 | Disposition: A | Discharge status: Home / Self Care | Payer: Medicaid Other | Source: Ambulatory Visit | Attending: Obstetrics & Gynecology | Admitting: Obstetrics & Gynecology

## 2016-04-12 ENCOUNTER — Encounter (HOSPITAL_COMMUNITY): Payer: Self-pay | Admitting: Certified Nurse Midwife

## 2016-04-12 DIAGNOSIS — N930 Postcoital and contact bleeding: Secondary | ICD-10-CM | POA: Diagnosis not present

## 2016-04-12 DIAGNOSIS — Z91010 Allergy to peanuts: Secondary | ICD-10-CM | POA: Insufficient documentation

## 2016-04-12 DIAGNOSIS — O368120 Decreased fetal movements, second trimester, not applicable or unspecified: Secondary | ICD-10-CM

## 2016-04-12 DIAGNOSIS — Z7982 Long term (current) use of aspirin: Secondary | ICD-10-CM | POA: Insufficient documentation

## 2016-04-12 DIAGNOSIS — O4693 Antepartum hemorrhage, unspecified, third trimester: Secondary | ICD-10-CM

## 2016-04-12 DIAGNOSIS — Z3A28 28 weeks gestation of pregnancy: Secondary | ICD-10-CM | POA: Insufficient documentation

## 2016-04-12 DIAGNOSIS — O368130 Decreased fetal movements, third trimester, not applicable or unspecified: Secondary | ICD-10-CM | POA: Diagnosis present

## 2016-04-12 DIAGNOSIS — F319 Bipolar disorder, unspecified: Secondary | ICD-10-CM | POA: Insufficient documentation

## 2016-04-12 DIAGNOSIS — Z79899 Other long term (current) drug therapy: Secondary | ICD-10-CM | POA: Diagnosis not present

## 2016-04-12 DIAGNOSIS — O99343 Other mental disorders complicating pregnancy, third trimester: Secondary | ICD-10-CM | POA: Diagnosis not present

## 2016-04-12 DIAGNOSIS — Z880 Allergy status to penicillin: Secondary | ICD-10-CM | POA: Insufficient documentation

## 2016-04-12 DIAGNOSIS — Z3689 Encounter for other specified antenatal screening: Secondary | ICD-10-CM

## 2016-04-12 LAB — WET PREP, GENITAL
CLUE CELLS WET PREP: NONE SEEN
Sperm: NONE SEEN
Trich, Wet Prep: NONE SEEN
Yeast Wet Prep HPF POC: NONE SEEN

## 2016-04-12 LAB — URINALYSIS, ROUTINE W REFLEX MICROSCOPIC
Bilirubin Urine: NEGATIVE
GLUCOSE, UA: 150 mg/dL — AB
HGB URINE DIPSTICK: NEGATIVE
Ketones, ur: NEGATIVE mg/dL
LEUKOCYTES UA: NEGATIVE
Nitrite: NEGATIVE
PH: 7 (ref 5.0–8.0)
Protein, ur: NEGATIVE mg/dL
Specific Gravity, Urine: 1.006 (ref 1.005–1.030)

## 2016-04-12 NOTE — MAU Provider Note (Signed)
Chief Complaint:  Vaginal Bleeding; Contractions; and Decreased Fetal Movement   First Provider Initiated Contact with Patient 04/12/16 2043      HPI: Sharon Townsend is a 26 y.o. [phone] at 75w5dwho presents to maternity admissions reporting light vaginal bleeding when wiping and irregular but uncomfortable contractions/cramping today. She also reports decreased fetal movement today but is feeling movement in MAU. She reports an increase in white/yellow discharge recently but no itching or odor.  She had intercourse this morning and reports the bleeding and cramping began a couple of hours afterwards. She has been drinking plenty of water today. She denies trying any other treatments for the cramping/contractions or decreased movement. She has not tried any treatments for bleeding or discharge. She denies LOF, vaginal itching/burning, urinary symptoms, h/a, dizziness, n/v, or fever/chills.    HPI  Past Medical History: Past Medical History:  Diagnosis Date  . Bipolar disorder (HCC)   . Chronic constipation   . Gall bladder stones   . Renal disorder    two tubes in left kidney going to bladder    Past obstetric history: OB History  Gravida Para Term Preterm AB Living  4 2   2 1 1   SAB TAB Ectopic Multiple Live Births  1     0 1    # Outcome Date GA Lbr Len/2nd Weight Sex Delivery Anes PTL Lv  4 Current           3 Preterm 08/03/14 [redacted]w[redacted]d 09:30 / 00:21 4 lb 7.1 oz (2.016 kg) M Vag-Spont EPI  LIV     Birth Comments: cleft lip  2 SAB 2011 [redacted]w[redacted]d         1 Preterm 2009 [redacted]w[redacted]d   F Vag-Spont  N FD      Past Surgical History: Past Surgical History:  Procedure Laterality Date  . CHOLECYSTECTOMY    . MANDIBLE SURGERY    . renal stents    . TONSILLECTOMY      Family History: Family History  Problem Relation Age of Onset  . Diabetes Mother   . Diabetes Father   . Hypertension Father     Social History: Social History  Substance Use Topics  . Smoking status: Never Smoker   . Smokeless tobacco: Never Used  . Alcohol use No     Comment: before preg    Allergies:  Allergies  Allergen Reactions  . Peanuts [Peanut Oil] Anaphylaxis  . Latex Swelling  . Penicillins Other (See Comments)    Reaction:  Pt states that it "shuts down her bowels and cannot urinate" Has patient had a PCN reaction causing immediate rash, facial/tongue/throat swelling, SOB or lightheadedness with hypotension: No Has patient had a PCN reaction causing severe rash involving mucus membranes or skin necrosis: No Has patient had a PCN reaction that required hospitalization No Has patient had a PCN reaction occurring within the last 10 years: No If all of the above answers are "NO", then may proceed with Cephalosp    Meds:  Facility-Administered Medications Prior to Admission  Medication Dose Route Frequency Provider Last Rate Last Dose  . EPINEPHrine (EPI-PEN) injection 0.3 mg  0.3 mg Subcutaneous Once Duane Lope, NP       Prescriptions Prior to Admission  Medication Sig Dispense Refill Last Dose  . aspirin EC 81 MG tablet Take 81 mg by mouth daily.   04/12/2016 at Unknown time  . Prenatal Vit-Fe Fumarate-FA (PRENATAL MULTIVITAMIN) TABS tablet Take 1 tablet by mouth daily at 12  noon.   04/12/2016 at Unknown time  . acetaminophen (TYLENOL) 500 MG tablet Take 500 mg by mouth every 6 (six) hours as needed for mild pain.   03/25/2016 at Unknown time  . butalbital-acetaminophen-caffeine (FIORICET) 50-325-40 MG tablet Take 1-2 tablets by mouth every 6 (six) hours as needed for headache. 30 tablet 1 03/25/2016 at Unknown time  . cyclobenzaprine (FLEXERIL) 10 MG tablet Take 1 tablet (10 mg total) by mouth 2 (two) times daily as needed for muscle spasms. 20 tablet 0   . Iron-FA-B Cmp-C-Biot-Probiotic (FUSION PLUS) CAPS Take 1 capsule by mouth daily before breakfast. (Patient not taking: Reported on 03/25/2016) 30 capsule 5 Not Taking at Unknown time  . promethazine (PHENERGAN) 25 MG tablet  Take 1 tablet (25 mg total) by mouth every 6 (six) hours as needed for nausea or vomiting. 30 tablet 1 03/24/2016 at Unknown time    ROS:  Review of Systems  Constitutional: Negative for chills, fatigue and fever.  Eyes: Negative for visual disturbance.  Respiratory: Negative for shortness of breath.   Cardiovascular: Negative for chest pain.  Gastrointestinal: Positive for abdominal pain. Negative for nausea and vomiting.  Genitourinary: Positive for vaginal bleeding and vaginal discharge. Negative for difficulty urinating, dysuria, flank pain, pelvic pain and vaginal pain.  Neurological: Negative for dizziness and headaches.  Psychiatric/Behavioral: Negative.      I have reviewed patient's Past Medical Hx, Surgical Hx, Family Hx, Social Hx, medications and allergies.   Physical Exam   Patient Vitals for the past 24 hrs:  BP Temp Pulse Resp Height Weight  04/12/16 1950 117/72 98.6 F (37 C) 101 17 - -  04/12/16 1944 - - - - 5\' 1"  (1.549 m) 180 lb (81.6 kg)   Constitutional: Well-developed, well-nourished female in no acute distress.  Cardiovascular: normal rate Respiratory: normal effort GI: Abd soft, non-tender, gravid appropriate for gestational age.  MS: Extremities nontender, no edema, normal ROM Neurologic: Alert and oriented x 4.  GU: Neg CVAT.  PELVIC EXAM: Cervix pink, visually closed, without lesion, scant white creamy discharge, no bleeding noted, vaginal walls and external genitalia normal    Cervix 0/thick/high, posterior   FHT:  Baseline 140 , moderate variability, accelerations present, no decelerations Contractions: None on toco or to palpation   Labs: Results for orders placed or performed during the hospital encounter of 04/12/16 (from the past 24 hour(s))  Urinalysis, Routine w reflex microscopic     Status: Abnormal   Collection Time: 04/12/16  7:42 PM  Result Value Ref Range   Color, Urine YELLOW YELLOW   APPearance HAZY (A) CLEAR   Specific  Gravity, Urine 1.006 1.005 - 1.030   pH 7.0 5.0 - 8.0   Glucose, UA 150 (A) NEGATIVE mg/dL   Hgb urine dipstick NEGATIVE NEGATIVE   Bilirubin Urine NEGATIVE NEGATIVE   Ketones, ur NEGATIVE NEGATIVE mg/dL   Protein, ur NEGATIVE NEGATIVE mg/dL   Nitrite NEGATIVE NEGATIVE   Leukocytes, UA NEGATIVE NEGATIVE  Wet prep, genital     Status: Abnormal   Collection Time: 04/12/16 10:15 PM  Result Value Ref Range   Yeast Wet Prep HPF POC NONE SEEN NONE SEEN   Trich, Wet Prep NONE SEEN NONE SEEN   Clue Cells Wet Prep HPF POC NONE SEEN NONE SEEN   WBC, Wet Prep HPF POC MODERATE (A) NONE SEEN   Sperm NONE SEEN       Imaging:  No results found.  MAU Course/MDM: I have ordered labs and reviewed results.  NST reviewed and reactive Consult Dr Mora ApplPinn with presentation, exam findings and test results.  Likely intercourse caused light spotting/cramping. No evidence of active bleeding or preterm labor on today's exam. Preterm labor and bleeding precautions reviewed Pt to f/u in office as scheduled   Pt stable at time of discharge.   Assessment: 1. Postcoital bleeding   2. Vaginal bleeding in pregnancy, third trimester   3. Decreased fetal movements in third trimester, single or unspecified fetus   4. NST (non-stress test) reactive     Plan: Discharge home with bleeding precautions Labor precautions and fetal kick counts  Follow-up Information    PINN, Sanjuana MaeWALDA STACIA, MD Follow up.   Specialty:  Obstetrics and Gynecology Why:  As scheduled, return to MAU as needed for emergencies Contact information: [address] [place] [id number] [phone]          Allergies as of 04/12/2016      Reactions   Peanuts [peanut Oil] Anaphylaxis   Latex Swelling   Penicillins Other (See Comments)   Reaction:  Pt states that it "shuts down her bowels and cannot urinate" Has patient had a PCN reaction causing immediate rash, facial/tongue/throat swelling, SOB or lightheadedness  with hypotension: No Has patient had a PCN reaction causing severe rash involving mucus membranes or skin necrosis: No Has patient had a PCN reaction that required hospitalization No Has patient had a PCN reaction occurring within the last 10 years: No If all of the above answers are "NO", then may proceed with Cephalosp      Medication List    TAKE these medications   acetaminophen 500 MG tablet Commonly known as:  TYLENOL Take 500 mg by mouth every 6 (six) hours as needed for mild pain.   aspirin EC 81 MG tablet Take 81 mg by mouth daily.   butalbital-acetaminophen-caffeine 50-325-40 MG tablet Commonly known as:  FIORICET Take 1-2 tablets by mouth every 6 (six) hours as needed for headache.   cyclobenzaprine 10 MG tablet Commonly known as:  FLEXERIL Take 1 tablet (10 mg total) by mouth 2 (two) times daily as needed for muscle spasms.   FUSION PLUS Caps Take 1 capsule by mouth daily before breakfast.   prenatal multivitamin Tabs tablet Take 1 tablet by mouth daily at 12 noon.   promethazine 25 MG tablet Commonly known as:  PHENERGAN Take 1 tablet (25 mg total) by mouth every 6 (six) hours as needed for nausea or vomiting.       Sharen CounterLisa Leftwich-Kirby Certified Nurse-Midwife 04/12/2016 11:10 PM

## 2016-04-12 NOTE — MAU Note (Addendum)
Pt reports contractions that started last night-every 15 mins. Started having some spotting around 4pm today-did not see any blood on tissue when she used bathroom here. Pt reports having intercourse last night and that is when the contractions started. Pt reports decreased fetal movement- audible fetal movement heard in triage and pt reports feeling movement.

## 2016-04-12 NOTE — Discharge Instructions (Signed)
Vaginal Bleeding During Pregnancy, Third Trimester A small amount of bleeding (spotting) from the vagina is relatively common in pregnancy. Various things can cause bleeding or spotting in pregnancy. Sometimes the bleeding is normal and is not a problem. However, bleeding during the third trimester can also be a sign of something serious for the mother and the baby. Be sure to tell your health care provider about any vaginal bleeding right away. Some possible causes of vaginal bleeding during the third trimester include:  The placenta may be partially or completely covering the opening to the cervix (placenta previa).  The placenta may have separated from the uterus (abruption of the placenta).  There may be an infection or growth on the cervix.  You may be starting labor, called discharging of the mucus plug.  The placenta may grow into the muscle layer of the uterus (placenta accreta). Follow these instructions at home: Watch your condition for any changes. The following actions may help to lessen any discomfort you are feeling:  Follow your health care provider's instructions for limiting your activity. If your health care provider orders bed rest, you may need to stay in bed and only get up to use the bathroom. However, your health care provider may allow you to continue light activity.  If needed, make plans for someone to help with your regular activities and responsibilities while you are on bed rest.  Keep track of the number of pads you use each day, how often you change pads, and how soaked (saturated) they are. Write this down.  Do not use tampons. Do not douche.  Do not have sexual intercourse or orgasms until approved by your health care provider.  Follow your health care provider's advice about lifting, driving, and physical activities.  If you pass any tissue from your vagina, save the tissue so you can show it to your health care provider.  Only take over-the-counter or  prescription medicines as directed by your health care provider.  Do not take aspirin because it can make you bleed.  Keep all follow-up appointments as directed by your health care provider. Contact a health care provider if:  You have any vaginal bleeding during any part of your pregnancy.  You have cramps or labor pains.  You have a fever, not controlled by medicine. Get help right away if:  You have severe cramps or pain in your back or belly (abdomen).  You have chills.  You have a gush of fluid from the vagina.  You pass large clots or tissue from your vagina.  Your bleeding increases.  You feel light-headed or weak.  You pass out.  You feel less movement or no movement of the baby. This information is not intended to replace advice given to you by your health care provider. Make sure you discuss any questions you have with your health care provider. Document Released: 06/06/2002 Document Revised: 08/22/2015 Document Reviewed: 11/21/2012 Elsevier Interactive Patient Education  2017 [employer] Inc[employer]  

## 2016-05-01 ENCOUNTER — Inpatient Hospital Stay (HOSPITAL_COMMUNITY)
Admission: AD | Admit: 2016-05-01 | Discharge: 2016-05-02 | Disposition: A | Discharge status: Home / Self Care | Payer: Medicaid Other | Source: Ambulatory Visit | Attending: Obstetrics and Gynecology | Admitting: Obstetrics and Gynecology

## 2016-05-01 ENCOUNTER — Encounter (HOSPITAL_COMMUNITY): Payer: Self-pay

## 2016-05-01 DIAGNOSIS — Z880 Allergy status to penicillin: Secondary | ICD-10-CM | POA: Diagnosis not present

## 2016-05-01 DIAGNOSIS — O99513 Diseases of the respiratory system complicating pregnancy, third trimester: Secondary | ICD-10-CM | POA: Insufficient documentation

## 2016-05-01 DIAGNOSIS — O9989 Other specified diseases and conditions complicating pregnancy, childbirth and the puerperium: Secondary | ICD-10-CM

## 2016-05-01 DIAGNOSIS — J09X2 Influenza due to identified novel influenza A virus with other respiratory manifestations: Secondary | ICD-10-CM | POA: Diagnosis present

## 2016-05-01 DIAGNOSIS — Z3A31 31 weeks gestation of pregnancy: Secondary | ICD-10-CM | POA: Insufficient documentation

## 2016-05-01 DIAGNOSIS — O99343 Other mental disorders complicating pregnancy, third trimester: Secondary | ICD-10-CM | POA: Insufficient documentation

## 2016-05-01 DIAGNOSIS — Z7982 Long term (current) use of aspirin: Secondary | ICD-10-CM | POA: Insufficient documentation

## 2016-05-01 DIAGNOSIS — F319 Bipolar disorder, unspecified: Secondary | ICD-10-CM | POA: Insufficient documentation

## 2016-05-01 DIAGNOSIS — Z79899 Other long term (current) drug therapy: Secondary | ICD-10-CM | POA: Diagnosis not present

## 2016-05-01 LAB — URINALYSIS, ROUTINE W REFLEX MICROSCOPIC
Bilirubin Urine: NEGATIVE
GLUCOSE, UA: NEGATIVE mg/dL
HGB URINE DIPSTICK: NEGATIVE
KETONES UR: 20 mg/dL — AB
Leukocytes, UA: NEGATIVE
Nitrite: NEGATIVE
PH: 5 (ref 5.0–8.0)
Protein, ur: 30 mg/dL — AB
Specific Gravity, Urine: 1.024 (ref 1.005–1.030)

## 2016-05-01 LAB — CBC
HEMATOCRIT: 30.9 % — AB (ref 36.0–46.0)
HEMOGLOBIN: 9.8 g/dL — AB (ref 12.0–15.0)
MCH: 25.3 pg — ABNORMAL LOW (ref 26.0–34.0)
MCHC: 31.7 g/dL (ref 30.0–36.0)
MCV: 79.6 fL (ref 78.0–100.0)
Platelets: 146 10*3/uL — ABNORMAL LOW (ref 150–400)
RBC: 3.88 MIL/uL (ref 3.87–5.11)
RDW: 14.1 % (ref 11.5–15.5)
WBC: 8.2 10*3/uL (ref 4.0–10.5)

## 2016-05-01 LAB — INFLUENZA PANEL BY PCR (TYPE A & B): Influenza B By PCR: NEGATIVE

## 2016-05-01 LAB — FETAL FIBRONECTIN: Fetal Fibronectin: NEGATIVE

## 2016-05-01 LAB — RAPID STREP SCREEN (MED CTR MEBANE ONLY): STREPTOCOCCUS, GROUP A SCREEN (DIRECT): NEGATIVE

## 2016-05-01 MED ORDER — SODIUM CHLORIDE 0.9 % IV SOLN
INTRAVENOUS | Status: DC
  Administered 2016-05-01: 22:00:00 via INTRAVENOUS

## 2016-05-01 MED ORDER — OSELTAMIVIR PHOSPHATE 75 MG PO CAPS
75.0000 mg | ORAL_CAPSULE | Freq: Once | ORAL | Status: AC
  Administered 2016-05-01: 75 mg via ORAL
  Filled 2016-05-01: qty 1

## 2016-05-01 MED ORDER — DEXTROSE IN LACTATED RINGERS 5 % IV SOLN
INTRAVENOUS | Status: DC
  Administered 2016-05-01: 23:00:00 via INTRAVENOUS

## 2016-05-01 MED ORDER — ACETAMINOPHEN 325 MG PO TABS
650.0000 mg | ORAL_TABLET | Freq: Four times a day (QID) | ORAL | Status: DC | PRN
  Administered 2016-05-01: 650 mg via ORAL
  Filled 2016-05-01: qty 2

## 2016-05-01 NOTE — MAU Note (Addendum)
Pt C/O chest pain since this morning, center of chest, does not radiate.  Also general body aches, has had a fever since last night - temp has been up to 102.  Vomiting since noon today, no diarrhea.  Has cough & sore throat.  Had one uc today, denies bleeding or LOF.  Last took tylenol @ 1300.

## 2016-05-01 NOTE — MAU Note (Signed)
Assumed care of pt

## 2016-05-01 NOTE — Progress Notes (Signed)
FFN collected and sent

## 2016-05-01 NOTE — MAU Provider Note (Signed)
History   Patient  Sharon Townsend is a 26 year old [id number] at 31 weeks and 3 days here withj complaints of sore throat, body ache and chills since this morning. Denies bleeding, leaking of fluid, feels positive fetal movements.   CSN: [id number]  Arrival date and time: 05/01/16 1613   None     Chief Complaint  Patient presents with  . Chest Pain  . Generalized Body Aches  . Fever   Fever   This is a new problem. The current episode started today. The problem occurs constantly. The problem has been resolved. The maximum temperature noted was 101 to 101.9 F. The temperature was taken using an oral thermometer. Associated symptoms include chest pain, congestion, coughing, headaches and a sore throat. Pertinent negatives include no abdominal pain, diarrhea, ear pain, muscle aches, nausea, rash, sleepiness, urinary pain, vomiting or wheezing. She has tried nothing for the symptoms. The treatment provided no relief.    OB History    Gravida Para Term Preterm AB Living   4 2   2 1 1    SAB TAB Ectopic Multiple Live Births   1     0 1      Past Medical History:  Diagnosis Date  . Bipolar disorder (HCC)   . Chronic constipation   . Gall bladder stones   . Renal disorder    two tubes in left kidney going to bladder    Past Surgical History:  Procedure Laterality Date  . CHOLECYSTECTOMY    . MANDIBLE SURGERY    . renal stents    . TONSILLECTOMY      Family History  Problem Relation Age of Onset  . Diabetes Mother   . Diabetes Father   . Hypertension Father     Social History  Substance Use Topics  . Smoking status: Never Smoker  . Smokeless tobacco: Never Used  . Alcohol use No     Comment: before preg    Allergies:  Allergies  Allergen Reactions  . Peanuts [Peanut Oil] Anaphylaxis  . Latex Swelling  . Penicillins Other (See Comments)    Reaction:  Pt states that it "shuts down her bowels and cannot urinate" Has patient had a PCN reaction causing immediate rash,  facial/tongue/throat swelling, SOB or lightheadedness with hypotension: No Has patient had a PCN reaction causing severe rash involving mucus membranes or skin necrosis: No Has patient had a PCN reaction that required hospitalization No Has patient had a PCN reaction occurring within the last 10 years: No If all of the above answers are "NO", then may proceed with Cephalosp    Facility-Administered Medications Prior to Admission  Medication Dose Route Frequency Provider Last Rate Last Dose  . EPINEPHrine (EPI-PEN) injection 0.3 mg  0.3 mg Subcutaneous Once Duane Lope, NP       Prescriptions Prior to Admission  Medication Sig Dispense Refill Last Dose  . acetaminophen (TYLENOL) 500 MG tablet Take 500 mg by mouth every 6 (six) hours as needed for mild pain.   05/01/2016 at Unknown time  . aspirin EC 81 MG tablet Take 81 mg by mouth daily.   05/01/2016 at Unknown time  . butalbital-acetaminophen-caffeine (FIORICET) 50-325-40 MG tablet Take 1-2 tablets by mouth every 6 (six) hours as needed for headache. 30 tablet 1 04/30/2016 at Unknown time  . cyclobenzaprine (FLEXERIL) 10 MG tablet Take 1 tablet (10 mg total) by mouth 2 (two) times daily as needed for muscle spasms. 20 tablet 0 04/30/2016 at Unknown  time  . Prenatal Vit-Fe Fumarate-FA (PRENATAL MULTIVITAMIN) TABS tablet Take 1 tablet by mouth daily at 12 noon.   05/01/2016 at Unknown time  . promethazine (PHENERGAN) 25 MG tablet Take 1 tablet (25 mg total) by mouth every 6 (six) hours as needed for nausea or vomiting. 30 tablet 1 05/01/2016 at Unknown time    Review of Systems  Constitutional: Positive for fever.  HENT: Positive for congestion and sore throat. Negative for ear pain.   Eyes: Negative.   Respiratory: Positive for cough. Negative for wheezing.   Cardiovascular: Positive for chest pain.  Gastrointestinal: Negative for abdominal pain, diarrhea, nausea and vomiting.  Endocrine: Negative.   Genitourinary: Negative for dysuria.   Musculoskeletal: Negative.   Skin: Negative for rash.  Neurological: Positive for headaches.  Hematological: Negative.   Psychiatric/Behavioral: Negative.    Physical Exam   Blood pressure 113/67, pulse (!) 134, temperature 99.8 F (37.7 C), temperature source Oral, resp. rate 18, last menstrual period 09/24/2015, SpO2 100 %, currently breastfeeding.  Physical Exam  Constitutional: She is oriented to person, place, and time. She appears well-developed and well-nourished.  HENT:  Head: Normocephalic.  Eyes: Pupils are equal, round, and reactive to light.  Neck: Normal range of motion.  Cardiovascular: Normal rate and regular rhythm.   Respiratory: Effort normal and breath sounds normal. No respiratory distress. She has no wheezes. She has no rales. She exhibits no tenderness.  CTA bilaterally  GI: Soft. Bowel sounds are normal. She exhibits no distension and no mass. There is no tenderness. There is no rebound and no guarding.  Genitourinary: Vagina normal. Rectal exam shows guaiac negative stool. No vaginal discharge found.  Musculoskeletal: Normal range of motion.  Neurological: She is alert and oriented to person, place, and time.  Skin: Skin is warm and dry.  Psychiatric: She has a normal mood and affect.    MAU Course  Procedures  MDM -EKG: sinus tachy with non-specific T wave abnormality -Tamiflu 75 mg, Tylenol -NST: reactive, baseline 150 with accelerations and moderate variability, occasional non-recurrent decelerations Discussed patient status and tracing with Dr. Claiborne Billingsallahan. Patient to be hydrated and then discharged once her pulse lowers.  Assessment and Plan   1. Influenza due to identified novel influenza A virus with other respiratory manifestations     2.  Sharon Townsend CNM 05/01/2016, 8:40 PM   Assumed care Gave a second bag of D5LR to give her some sugar for energy States feels much better HR came from 130s to 115-120 Will discharge home with  Tamiflu Follow up in office for recheck of status Encouraged to return here or to other Urgent Care/ED if she develops worsening of symptoms, increase in pain, fever, or other concerning symptoms.   Sharon Townsend, CNM

## 2016-05-02 MED ORDER — OSELTAMIVIR PHOSPHATE 75 MG PO CAPS: 75.0000 mg | ORAL_CAPSULE | Freq: Two times a day (BID) | ORAL | 0 refills | Status: DC

## 2016-05-02 NOTE — Discharge Instructions (Signed)
Pregnancy and Influenza Influenza, also called the flu, is an infection of the respiratory tract. If you are pregnant, you are more likely to catch the flu. You are also more likely to have a more serious case of the flu. This is because pregnancy lowers your body's ability to fight off infections (it weakens your immune system). It also puts additional stress on your heart and lungs, which makes you more likely to have complications. Having a bad case of the flu, especially with a high fever, can be dangerous for your developing baby. It can cause you to go into early labor. How do people get the flu? The flu is caused by the influenza virus. This virus is common every year in the fall and winter. It spreads when virus particles get passed from person to person. You can get the virus if you are near a sick person who is coughing or sneezing. You can also get the virus if you touch something that has the virus on it and then touch your face. How can I protect myself against the flu?  Get a flu shot. The best way to prevent the flu is to get a flu shot before flu season starts. The flu shot is not dangerous for your developing baby. It may even help protect your baby from the flu for up to 6 months after birth. The flu shot is one type of flu vaccine. Another type is a nasal spray vaccine. Do not get the nasal spray vaccine. It is not approved for pregnancy.  Do not come in close contact with sick people.  Do not share food, drinks, or utensils with other people.  Wash your hands often. Use hand sanitizer when soap and water are not available. What should I do if I have flu symptoms? If you have any flu symptoms, call your health care provider right away. Flu symptoms include:  Fever or chills.  Muscle aches.  Headache.  Sore throat.  Nasal congestion.  Cough.  Feeling tired.  Loss of appetite.  Vomiting.  Diarrhea.  You may be able to take an antiviral medicine to keep the flu  from becoming severe and to shorten how long it lasts. What should I do at home if I am diagnosed with the flu?  Do not take any medicine, including cold or flu medicine, unless directed by your health care provider.  If you take antiviral medicine, make sure you finish it even if you start to feel better.  Drink enough fluid to keep your urine clear or pale yellow.  Get plenty of rest. When would I seek immediate medical care if I have the flu?  You have trouble breathing.  You have chest pain.  You begin to have labor pains.  You have a high fever that does not go down after you take medicine.  You do not feel your baby move.  You have diarrhea or vomiting that will not go away. This information is not intended to replace advice given to you by your health care provider. Make sure you discuss any questions you have with your health care provider. Document Released: 01/17/2008 Document Revised: 08/22/2015 Document Reviewed: 02/10/2013 Elsevier Interactive Patient Education  2017 [employer] Inc.  

## 2016-05-02 NOTE — Progress Notes (Signed)
Written and verbal d/c instructions given and understanding voiced. 

## 2016-05-04 LAB — CULTURE, GROUP A STREP (THRC)

## 2016-05-19 ENCOUNTER — Encounter (HOSPITAL_COMMUNITY): Payer: Self-pay | Admitting: *Deleted

## 2016-05-19 ENCOUNTER — Inpatient Hospital Stay (HOSPITAL_COMMUNITY)
Admission: AD | Admit: 2016-05-19 | Discharge: 2016-05-19 | Disposition: A | Discharge status: Home / Self Care | Payer: Medicaid Other | Source: Ambulatory Visit | Attending: Obstetrics | Admitting: Obstetrics

## 2016-05-19 DIAGNOSIS — R51 Headache: Secondary | ICD-10-CM | POA: Insufficient documentation

## 2016-05-19 DIAGNOSIS — Z7982 Long term (current) use of aspirin: Secondary | ICD-10-CM | POA: Insufficient documentation

## 2016-05-19 DIAGNOSIS — O26893 Other specified pregnancy related conditions, third trimester: Secondary | ICD-10-CM | POA: Diagnosis not present

## 2016-05-19 DIAGNOSIS — Z3689 Encounter for other specified antenatal screening: Secondary | ICD-10-CM

## 2016-05-19 DIAGNOSIS — G4489 Other headache syndrome: Secondary | ICD-10-CM | POA: Diagnosis not present

## 2016-05-19 DIAGNOSIS — Z880 Allergy status to penicillin: Secondary | ICD-10-CM | POA: Insufficient documentation

## 2016-05-19 DIAGNOSIS — Z3A34 34 weeks gestation of pregnancy: Secondary | ICD-10-CM | POA: Insufficient documentation

## 2016-05-19 LAB — URINALYSIS, ROUTINE W REFLEX MICROSCOPIC
Bilirubin Urine: NEGATIVE
Glucose, UA: >=500 mg/dL — AB
HGB URINE DIPSTICK: NEGATIVE
KETONES UR: NEGATIVE mg/dL
NITRITE: NEGATIVE
PH: 6 (ref 5.0–8.0)
Protein, ur: NEGATIVE mg/dL
RBC / HPF: NONE SEEN RBC/hpf (ref 0–5)
Specific Gravity, Urine: 1.007 (ref 1.005–1.030)

## 2016-05-19 LAB — COMPREHENSIVE METABOLIC PANEL
ALBUMIN: 2.8 g/dL — AB (ref 3.5–5.0)
ALK PHOS: 124 U/L (ref 38–126)
ALT: 11 U/L — ABNORMAL LOW (ref 14–54)
ANION GAP: 10 (ref 5–15)
AST: 17 U/L (ref 15–41)
BUN: 6 mg/dL (ref 6–20)
CHLORIDE: 105 mmol/L (ref 101–111)
CO2: 20 mmol/L — AB (ref 22–32)
Calcium: 8.7 mg/dL — ABNORMAL LOW (ref 8.9–10.3)
Creatinine, Ser: 0.54 mg/dL (ref 0.44–1.00)
GFR calc non Af Amer: >60 mL/min (ref >60)
GLUCOSE: 131 mg/dL — AB (ref 65–99)
POTASSIUM: 3.7 mmol/L (ref 3.5–5.1)
SODIUM: 135 mmol/L (ref 135–145)
Total Bilirubin: 0.1 mg/dL — ABNORMAL LOW (ref 0.3–1.2)
Total Protein: 6.5 g/dL (ref 6.5–8.1)

## 2016-05-19 LAB — CBC WITH DIFFERENTIAL/PLATELET
BASOS PCT: 0 %
Basophils Absolute: 0 10*3/uL (ref 0.0–0.1)
EOS ABS: 0.1 10*3/uL (ref 0.0–0.7)
EOS PCT: 1 %
HCT: 30.1 % — ABNORMAL LOW (ref 36.0–46.0)
HEMOGLOBIN: 9.5 g/dL — AB (ref 12.0–15.0)
Lymphocytes Relative: 18 %
Lymphs Abs: 1.6 10*3/uL (ref 0.7–4.0)
MCH: 24.1 pg — AB (ref 26.0–34.0)
MCHC: 31.6 g/dL (ref 30.0–36.0)
MCV: 76.4 fL — ABNORMAL LOW (ref 78.0–100.0)
Monocytes Absolute: 0.3 10*3/uL (ref 0.1–1.0)
Monocytes Relative: 4 %
Neutro Abs: 6.7 10*3/uL (ref 1.7–7.7)
Neutrophils Relative %: 78 %
PLATELETS: 170 10*3/uL (ref 150–400)
RBC: 3.94 MIL/uL (ref 3.87–5.11)
RDW: 14.6 % (ref 11.5–15.5)
WBC: 8.6 10*3/uL (ref 4.0–10.5)

## 2016-05-19 LAB — LACTATE DEHYDROGENASE: LDH: 143 U/L (ref 98–192)

## 2016-05-19 LAB — URIC ACID: Uric Acid, Serum: 4.5 mg/dL (ref 2.3–6.6)

## 2016-05-19 LAB — PROTEIN / CREATININE RATIO, URINE
Creatinine, Urine: 41 mg/dL
PROTEIN CREATININE RATIO: 0.27 mg/mg{creat} — AB (ref 0.00–0.15)
TOTAL PROTEIN, URINE: 11 mg/dL

## 2016-05-19 MED ORDER — ACETAMINOPHEN 500 MG PO TABS
1000.0000 mg | ORAL_TABLET | Freq: Once | ORAL | Status: AC
  Administered 2016-05-19: 1000 mg via ORAL
  Filled 2016-05-19: qty 2

## 2016-05-19 MED ORDER — OXYCODONE HCL 5 MG PO TABS
10.0000 mg | ORAL_TABLET | Freq: Once | ORAL | Status: AC
  Administered 2016-05-19: 10 mg via ORAL
  Filled 2016-05-19: qty 2

## 2016-05-19 NOTE — Discharge Instructions (Signed)

## 2016-05-19 NOTE — MAU Provider Note (Signed)
History     CSN: [id number]  Arrival date and time: 05/19/16 1559   First Provider Initiated Contact with Patient 05/19/16 1631      Chief Complaint  Patient presents with  . Headache   HPI Sharon Townsend is a 26 y.o. [phone] at [redacted]w[redacted]d who presents to MAU today with complaint of headache, visual changes and decreased fetal movement. The patient states headache since last night. She has tried Fioricet and Flexeril without relief. She rates pain at 9/10 now. She has also have blurred vision, floaters and RUQ abdominal pain. She has mild swelling in her hands, but denies LE edema. She states fetal movement was decreased, but has been normal since in MAU. She denies vaginal bleeding, LOF or contractions. She was induced at 35 weeks with last pregnancy due to pre-eclampsia.   OB History    Gravida Para Term Preterm AB Living   4 2   2 1 1    SAB TAB Ectopic Multiple Live Births   1     0 1      Past Medical History:  Diagnosis Date  . Bipolar disorder (HCC)   . Chronic constipation   . Gall bladder stones   . Renal disorder    two tubes in left kidney going to bladder    Past Surgical History:  Procedure Laterality Date  . CHOLECYSTECTOMY    . MANDIBLE SURGERY    . renal stents    . TONSILLECTOMY      Family History  Problem Relation Age of Onset  . Diabetes Mother   . Diabetes Father   . Hypertension Father     Social History  Substance Use Topics  . Smoking status: Never Smoker  . Smokeless tobacco: Never Used  . Alcohol use No     Comment: before preg    Allergies:  Allergies  Allergen Reactions  . Peanuts [Peanut Oil] Anaphylaxis  . Latex Swelling  . Penicillins Other (See Comments)    Reaction:  Pt states that it "shuts down her bowels and cannot urinate" Has patient had a PCN reaction causing immediate rash, facial/tongue/throat swelling, SOB or lightheadedness with hypotension: No Has patient had a PCN reaction causing severe rash involving  mucus membranes or skin necrosis: No Has patient had a PCN reaction that required hospitalization No Has patient had a PCN reaction occurring within the last 10 years: No If all of the above answers are "NO", then may proceed with Cephalosp    Facility-Administered Medications Prior to Admission  Medication Dose Route Frequency Provider Last Rate Last Dose  . EPINEPHrine (EPI-PEN) injection 0.3 mg  0.3 mg Subcutaneous Once Duane Lope, NP       Prescriptions Prior to Admission  Medication Sig Dispense Refill Last Dose  . aspirin EC 81 MG tablet Take 81 mg by mouth daily.   05/19/2016 at Unknown time  . butalbital-acetaminophen-caffeine (FIORICET) 50-325-40 MG tablet Take 1-2 tablets by mouth every 6 (six) hours as needed for headache. 30 tablet 1 05/19/2016 at Unknown time  . cyclobenzaprine (FLEXERIL) 10 MG tablet Take 1 tablet (10 mg total) by mouth 2 (two) times daily as needed for muscle spasms. 20 tablet 0 05/19/2016 at Unknown time  . Prenatal Vit-Fe Fumarate-FA (PRENATAL MULTIVITAMIN) TABS tablet Take 1 tablet by mouth daily at 12 noon.   05/19/2016 at Unknown time  . oseltamivir (TAMIFLU) 75 MG capsule Take 1 capsule (75 mg total) by mouth 2 (two) times daily. (Patient not taking:  Reported on 05/19/2016) 10 capsule 0 Completed Course at Unknown time  . promethazine (PHENERGAN) 25 MG tablet Take 1 tablet (25 mg total) by mouth every 6 (six) hours as needed for nausea or vomiting. (Patient not taking: Reported on 05/19/2016) 30 tablet 1 Not Taking at Unknown time    Review of Systems  Constitutional: Negative for fever.  Eyes: Positive for visual disturbance.  Cardiovascular: Negative for leg swelling.  Gastrointestinal: Positive for abdominal pain.  Genitourinary: Negative for vaginal bleeding and vaginal discharge.  Neurological: Positive for headaches.   Physical Exam   Blood pressure 113/65, pulse 105, temperature 99.1 F (37.3 C), temperature source Oral, resp. rate 18,  weight 185 lb 6.4 oz (84.1 kg), last menstrual period 09/24/2015, currently breastfeeding.  Physical Exam  Nursing note and vitals reviewed. Constitutional: She is oriented to person, place, and time. She appears well-developed and well-nourished. No distress.  HENT:  Head: Normocephalic and atraumatic.  Cardiovascular: Normal rate.   Respiratory: Effort normal.  GI: Soft. She exhibits no distension and no mass. There is tenderness (mild tenderness to palpation of the RUQ). There is no rebound and no guarding.  Musculoskeletal: She exhibits no edema.  Neurological: She is alert and oriented to person, place, and time. She has normal reflexes.  No clonus  Skin: Skin is warm and dry. No erythema.  Psychiatric: She has a normal mood and affect.    Results for orders placed or performed during the hospital encounter of 05/19/16 (from the past 24 hour(s))  Urinalysis, Routine w reflex microscopic     Status: Abnormal   Collection Time: 05/19/16  4:13 PM  Result Value Ref Range   Color, Urine YELLOW YELLOW   APPearance HAZY (A) CLEAR   Specific Gravity, Urine 1.007 1.005 - 1.030   pH 6.0 5.0 - 8.0   Glucose, UA >=500 (A) NEGATIVE mg/dL   Hgb urine dipstick NEGATIVE NEGATIVE   Bilirubin Urine NEGATIVE NEGATIVE   Ketones, ur NEGATIVE NEGATIVE mg/dL   Protein, ur NEGATIVE NEGATIVE mg/dL   Nitrite NEGATIVE NEGATIVE   Leukocytes, UA TRACE (A) NEGATIVE   RBC / HPF NONE SEEN 0 - 5 RBC/hpf   WBC, UA 0-5 0 - 5 WBC/hpf   Bacteria, UA RARE (A) NONE SEEN   Squamous Epithelial / LPF 0-5 (A) NONE SEEN   Mucous PRESENT   Protein / creatinine ratio, urine     Status: Abnormal   Collection Time: 05/19/16  4:30 PM  Result Value Ref Range   Creatinine, Urine 41.00 mg/dL   Total Protein, Urine 11 mg/dL   Protein Creatinine Ratio 0.27 (H) 0.00 - 0.15 mg/mg[Cre]  CBC with Differential/Platelet     Status: Abnormal   Collection Time: 05/19/16  4:47 PM  Result Value Ref Range   WBC 8.6 4.0 - 10.5  K/uL   RBC 3.94 3.87 - 5.11 MIL/uL   Hemoglobin 9.5 (L) 12.0 - 15.0 g/dL   HCT 13.230.1 (L) 44.036.0 - 10.246.0 %   MCV 76.4 (L) 78.0 - 100.0 fL   MCH 24.1 (L) 26.0 - 34.0 pg   MCHC 31.6 30.0 - 36.0 g/dL   RDW 72.514.6 36.611.5 - 44.015.5 %   Platelets 170 150 - 400 K/uL   Neutrophils Relative % 78 %   Neutro Abs 6.7 1.7 - 7.7 K/uL   Lymphocytes Relative 18 %   Lymphs Abs 1.6 0.7 - 4.0 K/uL   Monocytes Relative 4 %   Monocytes Absolute 0.3 0.1 - 1.0 K/uL  Eosinophils Relative 1 %   Eosinophils Absolute 0.1 0.0 - 0.7 K/uL   Basophils Relative 0 %   Basophils Absolute 0.0 0.0 - 0.1 K/uL  Comprehensive metabolic panel     Status: Abnormal   Collection Time: 05/19/16  4:47 PM  Result Value Ref Range   Sodium 135 135 - 145 mmol/L   Potassium 3.7 3.5 - 5.1 mmol/L   Chloride 105 101 - 111 mmol/L   CO2 20 (L) 22 - 32 mmol/L   Glucose, Bld 131 (H) 65 - 99 mg/dL   BUN 6 6 - 20 mg/dL   Creatinine, Ser 1.61 0.44 - 1.00 mg/dL   Calcium 8.7 (L) 8.9 - 10.3 mg/dL   Total Protein 6.5 6.5 - 8.1 g/dL   Albumin 2.8 (L) 3.5 - 5.0 g/dL   AST 17 15 - 41 U/L   ALT 11 (L) 14 - 54 U/L   Alkaline Phosphatase 124 38 - 126 U/L   Total Bilirubin 0.1 (L) 0.3 - 1.2 mg/dL   GFR calc non Af Amer >60 >60 mL/min   GFR calc Af Amer >60 >60 mL/min   Anion gap 10 5 - 15  Uric acid     Status: None   Collection Time: 05/19/16  4:47 PM  Result Value Ref Range   Uric Acid, Serum 4.5 2.3 - 6.6 mg/dL  Lactate dehydrogenase     Status: None   Collection Time: 05/19/16  4:47 PM  Result Value Ref Range   LDH 143 98 - 192 U/L    Fetal Monitoring: Baseline: 130 bpm Variability: moderate Accelerations: 15 x 15 Decelerations: none Contractions: few, irregular  Patient Vitals for the past 24 hrs:  BP Temp Temp src Pulse Resp Weight  05/19/16 1745 113/65 - - 105 - -  05/19/16 1730 110/69 - - 109 - -  05/19/16 1715 112/68 - - 116 - -  05/19/16 1700 121/75 - - 107 - -  05/19/16 1645 102/63 - - 112 - -  05/19/16 1630 115/69 - - 115  - -  05/19/16 1628 123/68 - - 105 - -  05/19/16 1610 120/77 99.1 F (37.3 C) Oral 104 18 185 lb 6.4 oz (84.1 kg)    MAU Course  Procedures None  MDM CBC, CMP, Uric Acid, LDH, UA and urine protein/creatinine  Serial BPs Discussed patient and results with Dr. Chestine Spore. If headache responds to Tylenol, ok for discharge. If not can offer Percocet or headache cocktail.  Oxycodone given. Patient reports improvement in headache. Reassured of normal fetal movement while in MAU.   Assessment and Plan  A: SIUP at [redacted]w[redacted]d Headache Reactive NST   P: Discharge home Continue Flexeril and Fioricet for headaches PRN Pre-eclampsia and preterm labor precautions discussed Patient advised to follow-up with [facility] as scheduled on Thursday or sooner PRN Patient may return to MAU as needed or if her condition were to change or worsen   Marny Lowenstein, PA-C  05/19/2016, 8:13 PM

## 2016-05-19 NOTE — MAU Note (Signed)
Has had a headache since last night, took meds that were prescribed, not helping.eyes blurring since this morning. Having pain in RUQ, denies increase in swelling. Reports decrease in fetal movement

## 2016-05-20 ENCOUNTER — Encounter (HOSPITAL_COMMUNITY): Payer: Self-pay | Admitting: *Deleted

## 2016-05-20 ENCOUNTER — Inpatient Hospital Stay (HOSPITAL_COMMUNITY)
Admission: AD | Admit: 2016-05-20 | Discharge: 2016-05-20 | Disposition: A | Discharge status: Home / Self Care | Payer: Medicaid Other | Source: Ambulatory Visit | Attending: Obstetrics and Gynecology | Admitting: Obstetrics and Gynecology

## 2016-05-20 DIAGNOSIS — Z3A00 Weeks of gestation of pregnancy not specified: Secondary | ICD-10-CM | POA: Insufficient documentation

## 2016-05-20 DIAGNOSIS — R519 Headache, unspecified: Secondary | ICD-10-CM

## 2016-05-20 DIAGNOSIS — R51 Headache: Secondary | ICD-10-CM | POA: Diagnosis not present

## 2016-05-20 DIAGNOSIS — O26893 Other specified pregnancy related conditions, third trimester: Secondary | ICD-10-CM | POA: Diagnosis not present

## 2016-05-20 LAB — URINALYSIS, ROUTINE W REFLEX MICROSCOPIC
BILIRUBIN URINE: NEGATIVE
Glucose, UA: >=500 mg/dL — AB
HGB URINE DIPSTICK: NEGATIVE
KETONES UR: NEGATIVE mg/dL
Nitrite: NEGATIVE
PROTEIN: NEGATIVE mg/dL
Specific Gravity, Urine: 1.015 (ref 1.005–1.030)
pH: 6 (ref 5.0–8.0)

## 2016-05-20 MED ORDER — OXYCODONE-ACETAMINOPHEN 2.5-325 MG PO TABS: 1.0000 | ORAL_TABLET | ORAL | 0 refills | Status: DC | PRN

## 2016-05-20 MED ORDER — OXYCODONE-ACETAMINOPHEN 5-325 MG PO TABS: 1.0000 | ORAL_TABLET | ORAL | Status: DC | PRN

## 2016-05-20 NOTE — MAU Provider Note (Signed)
History     CSN: [id number]  Arrival date and time: 05/20/16 1752   None     Chief Complaint  Patient presents with  . Headache  . Generalized Body Aches   HPI Sharon Townsend is 26 y.o. [phone] [redacted]w[redacted]d weeks presenting with worsening headache.  She reports black spots in her vision.  + for pain under her right ribs and general body aches.  She was seen yesterday in MAU for headache, visual changes and decreased fetal movement. Has Fioricet and Flexeril for headaches, neither have helped.  Last took med at 11 am and took Tylenol at 2pm.  Rates h/a 10/10.  Positive for nausea. Neg for Vomiting, fever or chills.  Has had only a waffle this am. States the baby is more active today.   States her sxs feel like pre-eclampsia, the same as she felt with her previous pregnancy.  Hx of preeclampsia with last pregnancy and was delivered at 35 weeks.  Labs drawn at 18:13 yesterday were all WNL except low Hgb.     Past Medical History:  Diagnosis Date  . Bipolar disorder (HCC)   . Chronic constipation   . Gall bladder stones   . Renal disorder    two tubes in left kidney going to bladder    Past Surgical History:  Procedure Laterality Date  . CHOLECYSTECTOMY    . MANDIBLE SURGERY    . renal stents    . TONSILLECTOMY      Family History  Problem Relation Age of Onset  . Diabetes Mother   . Diabetes Father   . Hypertension Father     Social History  Substance Use Topics  . Smoking status: Never Smoker  . Smokeless tobacco: Never Used  . Alcohol use No     Comment: before preg    Allergies:  Allergies  Allergen Reactions  . Peanuts [Peanut Oil] Anaphylaxis  . Latex Swelling  . Penicillins Other (See Comments)    Reaction:  Pt states that it "shuts down her bowels and cannot urinate" Has patient had a PCN reaction causing immediate rash, facial/tongue/throat swelling, SOB or lightheadedness with hypotension: No Has patient had a PCN reaction causing severe rash involving  mucus membranes or skin necrosis: No Has patient had a PCN reaction that required hospitalization No Has patient had a PCN reaction occurring within the last 10 years: No If all of the above answers are "NO", then may proceed with Cephalosp    Facility-Administered Medications Prior to Admission  Medication Dose Route Frequency Provider Last Rate Last Dose  . EPINEPHrine (EPI-PEN) injection 0.3 mg  0.3 mg Subcutaneous Once Duane Lope, NP       Prescriptions Prior to Admission  Medication Sig Dispense Refill Last Dose  . aspirin EC 81 MG tablet Take 81 mg by mouth daily.   05/19/2016 at Unknown time  . butalbital-acetaminophen-caffeine (FIORICET) 50-325-40 MG tablet Take 1-2 tablets by mouth every 6 (six) hours as needed for headache. 30 tablet 1 05/19/2016 at Unknown time  . cyclobenzaprine (FLEXERIL) 10 MG tablet Take 1 tablet (10 mg total) by mouth 2 (two) times daily as needed for muscle spasms. 20 tablet 0 05/19/2016 at Unknown time  . Prenatal Vit-Fe Fumarate-FA (PRENATAL MULTIVITAMIN) TABS tablet Take 1 tablet by mouth daily at 12 noon.   05/19/2016 at Unknown time    Review of Systems  Constitutional: Positive for appetite change. Negative for chills and fever.  Eyes: Positive for visual disturbance (black spots).  Respiratory: Negative for shortness of breath.   Cardiovascular: Negative for chest pain.  Gastrointestinal: Positive for abdominal pain (right upper quadrant) and nausea. Negative for vomiting.  Genitourinary: Negative for difficulty urinating, dysuria, hematuria, vaginal bleeding and vaginal discharge.  Neurological: Positive for headaches. Negative for dizziness.   Physical Exam   Blood pressure 113/71, pulse 102, temperature 98.9 F (37.2 C), temperature source Oral, resp. rate 16, weight 183 lb 12.8 oz (83.4 kg), last menstrual period 09/24/2015, currently breastfeeding.  Physical Exam  Nursing note and vitals reviewed. Constitutional: She is oriented to  person, place, and time. She appears well-developed and well-nourished. No distress.  HENT:  Head: Normocephalic.  Cardiovascular: Regular rhythm.   Respiratory: Effort normal and breath sounds normal.  GI: Soft. There is tenderness (mild bilaterally upper abdominal pain). There is no rebound and no guarding.  Musculoskeletal: She exhibits no edema.  Neurological: She is alert and oriented to person, place, and time. No cranial nerve deficit.  Skin: Skin is warm and dry.  Psychiatric: She has a normal mood and affect. Her behavior is normal. Thought content normal.    NST Baseline 135; mod variability, 15X15 accels, no decels  Patient Vitals for the past 24 hrs:  BP Temp Temp src Pulse Resp Weight  05/20/16 1901 113/71 - - 104 - -  05/20/16 1846 114/71 - - 100 - -  05/20/16 1826 113/71 - - 102 - -  05/20/16 1804 125/74 98.9 F (37.2 C) Oral 104 16 183 lb 12.8 oz (83.4 kg)   Results for orders placed or performed during the hospital encounter of 05/20/16 (from the past 72 hour(s))  Urinalysis, Routine w reflex microscopic     Status: Abnormal   Collection Time: 05/20/16  6:05 PM  Result Value Ref Range   Color, Urine YELLOW YELLOW   APPearance CLEAR CLEAR   Specific Gravity, Urine 1.015 1.005 - 1.030   pH 6.0 5.0 - 8.0   Glucose, UA >=500 (A) NEGATIVE mg/dL   Hgb urine dipstick NEGATIVE NEGATIVE   Bilirubin Urine NEGATIVE NEGATIVE   Ketones, ur NEGATIVE NEGATIVE mg/dL   Protein, ur NEGATIVE NEGATIVE mg/dL   Nitrite NEGATIVE NEGATIVE   Leukocytes, UA TRACE (A) NEGATIVE   RBC / HPF 0-5 0 - 5 RBC/hpf   WBC, UA 0-5 0 - 5 WBC/hpf   Bacteria, UA RARE (A) NONE SEEN   Squamous Epithelial / LPF 0-5 (A) NONE SEEN   Mucous PRESENT    MAU Course  Procedures  MDM MSE NST EXAM Reported MSE, exam, yesterday labs and NST to Dr. Dareen PianoAnderson.  He is coming in to see her.   Assessment and Plan  A:  Headache at 7486w1d gestation      History of preeclampsia with previous pregnancy P:   Dr. Dareen PianoAnderson in to see patient and to decide plan of care  [name] 05/20/2016, 6:34 PM

## 2016-05-20 NOTE — MAU Note (Signed)
Headache, seeing black spots, pain under rt ribs.  Pain is worse than yesterday.  Also having general body aches.

## 2016-05-21 ENCOUNTER — Other Ambulatory Visit: Payer: Self-pay | Admitting: Advanced Practice Midwife

## 2016-05-26 ENCOUNTER — Other Ambulatory Visit: Payer: Self-pay | Admitting: Obstetrics and Gynecology

## 2016-05-26 LAB — OB RESULTS CONSOLE GC/CHLAMYDIA
Chlamydia: NEGATIVE
Gonorrhea: NEGATIVE

## 2016-05-26 LAB — OB RESULTS CONSOLE GBS: STREP GROUP B AG: NEGATIVE

## 2016-05-31 ENCOUNTER — Inpatient Hospital Stay (HOSPITAL_COMMUNITY)
Admission: AD | Admit: 2016-05-31 | Discharge: 2016-06-01 | Disposition: A | Discharge status: Home / Self Care | Payer: Medicaid Other | Source: Ambulatory Visit | Attending: Obstetrics | Admitting: Obstetrics

## 2016-05-31 ENCOUNTER — Encounter (HOSPITAL_COMMUNITY): Payer: Self-pay

## 2016-05-31 DIAGNOSIS — O479 False labor, unspecified: Secondary | ICD-10-CM

## 2016-05-31 DIAGNOSIS — M79604 Pain in right leg: Secondary | ICD-10-CM

## 2016-05-31 DIAGNOSIS — Z3A35 35 weeks gestation of pregnancy: Secondary | ICD-10-CM | POA: Insufficient documentation

## 2016-05-31 DIAGNOSIS — Z7982 Long term (current) use of aspirin: Secondary | ICD-10-CM | POA: Diagnosis not present

## 2016-05-31 DIAGNOSIS — O4703 False labor before 37 completed weeks of gestation, third trimester: Secondary | ICD-10-CM | POA: Diagnosis not present

## 2016-05-31 DIAGNOSIS — O368130 Decreased fetal movements, third trimester, not applicable or unspecified: Secondary | ICD-10-CM | POA: Insufficient documentation

## 2016-05-31 DIAGNOSIS — Z880 Allergy status to penicillin: Secondary | ICD-10-CM | POA: Diagnosis not present

## 2016-05-31 LAB — URINALYSIS, ROUTINE W REFLEX MICROSCOPIC
Bilirubin Urine: NEGATIVE
Glucose, UA: 250 mg/dL — AB
Hgb urine dipstick: NEGATIVE
Ketones, ur: NEGATIVE mg/dL
LEUKOCYTES UA: NEGATIVE
NITRITE: NEGATIVE
PH: 6 (ref 5.0–8.0)
Protein, ur: NEGATIVE mg/dL

## 2016-05-31 NOTE — MAU Note (Addendum)
Pt c/o contractions every 4-6 mins x1 hour. Denies LOF or vag bleeding. Has had decreased fetal movement today- last felt movement around 7pm. Last ate/drank around 7pm. Pt also complaining of leg spams x2 days. Denies urinary s/s.

## 2016-06-01 DIAGNOSIS — O4703 False labor before 37 completed weeks of gestation, third trimester: Secondary | ICD-10-CM | POA: Diagnosis not present

## 2016-06-01 DIAGNOSIS — O479 False labor, unspecified: Secondary | ICD-10-CM

## 2016-06-01 NOTE — MAU Provider Note (Signed)
Chief Complaint:  Contractions and Decreased Fetal Movement   First Provider Initiated Contact with Patient 06/01/16 0013      HPI: Sharon Townsend is a 26 y.o. [phone] at [redacted]w[redacted]d who presents to maternity admissions reporting contractions, less fetal movement today than usual, and pain in her right leg. She reports contractions are not new but have become more painful today than usual.  She is feeling fetal movement in MAU. Her leg pain started over 1 week ago. She started magnesium tablets for h/a just before her pain started. It is intermittent pain in her right calf. Nothing makes it worse or better, it is not affected by standing or walking.  There are no associated symptoms. She denies LOF, vaginal bleeding, vaginal itching/burning, urinary symptoms, h/a, dizziness, n/v, or fever/chills.    HPI  Past Medical History: Past Medical History:  Diagnosis Date  . Bipolar disorder (HCC)   . Chronic constipation   . Gall bladder stones   . Renal disorder    two tubes in left kidney going to bladder    Past obstetric history: OB History  Gravida Para Term Preterm AB Living  4 2   2 1 1   SAB TAB Ectopic Multiple Live Births  1     0 1    # Outcome Date GA Lbr Len/2nd Weight Sex Delivery Anes PTL Lv  4 Current           3 Preterm 08/03/14 [redacted]w[redacted]d 09:30 / 00:21 4 lb 7.1 oz (2.016 kg) M Vag-Spont EPI  LIV     Birth Comments: cleft lip  2 SAB 2011 [redacted]w[redacted]d         1 Preterm 2009 [redacted]w[redacted]d   F Vag-Spont  N FD      Past Surgical History: Past Surgical History:  Procedure Laterality Date  . CHOLECYSTECTOMY    . MANDIBLE SURGERY    . renal stents    . TONSILLECTOMY      Family History: Family History  Problem Relation Age of Onset  . Diabetes Mother   . Diabetes Father   . Hypertension Father     Social History: Social History  Substance Use Topics  . Smoking status: Never Smoker  . Smokeless tobacco: Never Used  . Alcohol use No     Comment: before preg    Allergies:   Allergies  Allergen Reactions  . Peanuts [Peanut Oil] Anaphylaxis  . Latex Swelling  . Penicillins Other (See Comments)    Reaction:  Pt states that it "shuts down her bowels and cannot urinate" Has patient had a PCN reaction causing immediate rash, facial/tongue/throat swelling, SOB or lightheadedness with hypotension: No Has patient had a PCN reaction causing severe rash involving mucus membranes or skin necrosis: No Has patient had a PCN reaction that required hospitalization No Has patient had a PCN reaction occurring within the last 10 years: No If all of the above answers are "NO", then may proceed with Cephalosp    Meds:  Facility-Administered Medications Prior to Admission  Medication Dose Route Frequency Provider Last Rate Last Dose  . EPINEPHrine (EPI-PEN) injection 0.3 mg  0.3 mg Subcutaneous Once Duane Lope, NP       Prescriptions Prior to Admission  Medication Sig Dispense Refill Last Dose  . aspirin EC 81 MG tablet Take 81 mg by mouth daily.   05/31/2016 at Unknown time  . butalbital-acetaminophen-caffeine (FIORICET) 50-325-40 MG tablet Take 1-2 tablets by mouth every 6 (six) hours as needed for  headache. 30 tablet 1 05/31/2016 at Unknown time  . cyclobenzaprine (FLEXERIL) 10 MG tablet Take 1 tablet (10 mg total) by mouth 2 (two) times daily as needed for muscle spasms. 20 tablet 0 05/31/2016 at Unknown time  . oxycodone-acetaminophen (PERCOCET) 2.5-325 MG tablet Take 1 tablet by mouth every 4 (four) hours as needed for pain. 30 tablet 0 05/31/2016 at Unknown time  . acetaminophen (TYLENOL) 500 MG tablet Take 1,000 mg by mouth every 6 (six) hours as needed for headache.   05/20/2016 at Unknown time  . Prenatal Vit-Fe Fumarate-FA (PRENATAL MULTIVITAMIN) TABS tablet Take 1 tablet by mouth daily at 12 noon.   05/20/2016 at Unknown time    ROS:  Review of Systems  Constitutional: Negative for chills, fatigue and fever.  Eyes: Negative for visual disturbance.  Respiratory:  Negative for shortness of breath.   Cardiovascular: Negative for chest pain.  Gastrointestinal: Positive for abdominal pain. Negative for nausea and vomiting.  Genitourinary: Positive for pelvic pain. Negative for difficulty urinating, dysuria, flank pain, vaginal bleeding, vaginal discharge and vaginal pain.  Musculoskeletal: Positive for myalgias.  Neurological: Negative for dizziness and headaches.  Psychiatric/Behavioral: Negative.      I have reviewed patient's Past Medical Hx, Surgical Hx, Family Hx, Social Hx, medications and allergies.   Physical Exam  Patient Vitals for the past 24 hrs:  BP Temp Temp src Pulse Resp SpO2 Height Weight  05/31/16 2210 129/80 97.9 F (36.6 C) Oral (!) 133 18 97 % - -  05/31/16 2204 - - - - - - 5' (1.524 m) 189 lb (85.7 kg)   Constitutional: Well-developed, well-nourished female in no acute distress.  Cardiovascular: normal rate Respiratory: normal effort GI: Abd soft, non-tender, gravid appropriate for gestational age.  MS: Extremities nontender, no edema, normal ROM.  Negative Homan's sign BLE.  Right and left calves symmetric in size (38.5 and 30 cm respectively).  Both cool to touch, no visible edema or erythema. Neurologic: Alert and oriented x 4.  GU: Neg CVAT.   Dilation: Fingertip Effacement (%): Thick Cervical Position: Posterior Exam by:: Sharen CounterLisa Leftwich Kirby CNM  FHT:  Baseline 140, moderate variability, accelerations present, no decelerations Contractions: rare, mild to palpation   Labs: Results for orders placed or performed during the hospital encounter of 05/31/16 (from the past 24 hour(s))  Urinalysis, Routine w reflex microscopic     Status: Abnormal   Collection Time: 05/31/16  9:59 PM  Result Value Ref Range   Color, Urine YELLOW YELLOW   APPearance CLEAR CLEAR   Specific Gravity, Urine <1.005 (L) 1.005 - 1.030   pH 6.0 5.0 - 8.0   Glucose, UA 250 (A) NEGATIVE mg/dL   Hgb urine dipstick NEGATIVE NEGATIVE    Bilirubin Urine NEGATIVE NEGATIVE   Ketones, ur NEGATIVE NEGATIVE mg/dL   Protein, ur NEGATIVE NEGATIVE mg/dL   Nitrite NEGATIVE NEGATIVE   Leukocytes, UA NEGATIVE NEGATIVE      Imaging:  No results found.  MAU Course/MDM: I have ordered labs and reviewed results.  NST reviewed No evidence of preterm labor today.  Pain in right leg c/w leg cramps.  No evidence of DVT with symmetric calves, no heat or swelling, neg Homan's.  Pt only risk factor for DVT is pregnancy, no recent bedrest, smoking, or surgery. Consult Dr Chestine Sporelark with presentation, exam findings and test results.  DVT precautions and preterm labor precautions reviewed Pt may stop magnesium supplements if they are not improving her h/a and she thinks they are causing  leg cramps Pt stable at time of discharge.  Today's evaluation included a work-up for preterm labor which can be life-threatening for both mom and baby.  Assessment: 1. Braxton Hicks contractions   2. Right leg pain     Plan: Discharge home Labor precautions and fetal kick counts  Follow-up Information    [facility] GEFFEL Chestine Spore, MD Follow up.   Specialty:  Obstetrics Why:  You may discontinue magnesium tablets if they do not help your headaches.  Keep scheduled appointments in the office. Return to MAU as needed for emergencies. Contact information: [address] [place] [address] [phone]          Allergies as of 06/01/2016      Reactions   Peanuts [peanut Oil] Anaphylaxis   Latex Swelling   Penicillins Other (See Comments)   Reaction:  Pt states that it "shuts down her bowels and cannot urinate" Has patient had a PCN reaction causing immediate rash, facial/tongue/throat swelling, SOB or lightheadedness with hypotension: No Has patient had a PCN reaction causing severe rash involving mucus membranes or skin necrosis: No Has patient had a PCN reaction that required hospitalization No Has patient had a PCN reaction occurring  within the last 10 years: No If all of the above answers are "NO", then may proceed with Cephalosp      Medication List    TAKE these medications   acetaminophen 500 MG tablet Commonly known as:  TYLENOL Take 1,000 mg by mouth every 6 (six) hours as needed for headache.   aspirin EC 81 MG tablet Take 81 mg by mouth daily.   butalbital-acetaminophen-caffeine 50-325-40 MG tablet Commonly known as:  FIORICET Take 1-2 tablets by mouth every 6 (six) hours as needed for headache.   cyclobenzaprine 10 MG tablet Commonly known as:  FLEXERIL Take 1 tablet (10 mg total) by mouth 2 (two) times daily as needed for muscle spasms.   oxycodone-acetaminophen 2.5-325 MG tablet Commonly known as:  PERCOCET Take 1 tablet by mouth every 4 (four) hours as needed for pain.   prenatal multivitamin Tabs tablet Take 1 tablet by mouth daily at 12 noon.       Sharen Counter Certified Nurse-Midwife 06/01/2016 12:35 AM

## 2016-06-02 ENCOUNTER — Inpatient Hospital Stay (HOSPITAL_COMMUNITY)
Admission: AD | Admit: 2016-06-02 | Discharge: 2016-06-02 | Disposition: A | Discharge status: Home / Self Care | Payer: Medicaid Other | Source: Ambulatory Visit | Attending: Obstetrics & Gynecology | Admitting: Obstetrics & Gynecology

## 2016-06-02 ENCOUNTER — Encounter (HOSPITAL_COMMUNITY): Payer: Self-pay

## 2016-06-02 DIAGNOSIS — Z3A36 36 weeks gestation of pregnancy: Secondary | ICD-10-CM | POA: Insufficient documentation

## 2016-06-02 DIAGNOSIS — O479 False labor, unspecified: Secondary | ICD-10-CM

## 2016-06-02 LAB — URINALYSIS, ROUTINE W REFLEX MICROSCOPIC
Bilirubin Urine: NEGATIVE
Glucose, UA: 150 mg/dL — AB
HGB URINE DIPSTICK: NEGATIVE
Ketones, ur: NEGATIVE mg/dL
Leukocytes, UA: NEGATIVE
NITRITE: NEGATIVE
PROTEIN: NEGATIVE mg/dL
Specific Gravity, Urine: 1.003 — ABNORMAL LOW (ref 1.005–1.030)
pH: 6 (ref 5.0–8.0)

## 2016-06-02 NOTE — MAU Note (Signed)
I have communicated with Dr. Mora ApplPinn and reviewed vital signs: There were no vitals filed for this visit.  Vaginal exam:  Dilation: Fingertip Effacement (%): 50 Cervical Position: Posterior Station: Ballotable Exam by:: Nancee LiterLynette Weston RN ,   Also reviewed contraction pattern and that non-stress test is reactive. It has been documented that patient is contracting irregularly with no cervical change since yesterday not indicating active labor.  Patient denies any other complaints. Based on this report provider has given order for discharge. A discharge order and diagnosis entered by a provider.   Labor discharge instructions reviewed with patient.

## 2016-06-02 NOTE — MAU Note (Signed)
Pt presents complaining of contractions every 4-6 minutes for the last 2 hours. Denies leaking or bleeding. Reports good fetal movement.

## 2016-06-02 NOTE — Discharge Instructions (Signed)
Braxton Hicks Contractions °Contractions of the uterus can occur throughout pregnancy, but they are not always a sign that you are in labor. You may have practice contractions called Braxton Hicks contractions. These false labor contractions are sometimes confused with true labor. °What are Braxton Hicks contractions? °Braxton Hicks contractions are tightening movements that occur in the muscles of the uterus before labor. Unlike true labor contractions, these contractions do not result in opening (dilation) and thinning of the cervix. Toward the end of pregnancy (32-34 weeks), Braxton Hicks contractions can happen more often and may become stronger. These contractions are sometimes difficult to tell apart from true labor because they can be very uncomfortable. You should not feel embarrassed if you go to the hospital with false labor. °Sometimes, the only way to tell if you are in true labor is for your health care provider to look for changes in the cervix. The health care provider will do a physical exam and may monitor your contractions. If you are not in true labor, the exam should show that your cervix is not dilating and your water has not broken. °If there are no prenatal problems or other health problems associated with your pregnancy, it is completely safe for you to be sent home with false labor. You may continue to have Braxton Hicks contractions until you go into true labor. °How can I tell the difference between true labor and false labor? °· Differences °¨ False labor °¨ Contractions last 30-70 seconds.: Contractions are usually shorter and not as strong as true labor contractions. °¨ Contractions become very regular.: Contractions are usually irregular. °¨ Discomfort is usually felt in the top of the uterus, and it spreads to the lower abdomen and low back.: Contractions are often felt in the front of the lower abdomen and in the groin. °¨ Contractions do not go away with walking.: Contractions may  go away when you walk around or change positions while lying down. °¨ Contractions usually become more intense and increase in frequency.: Contractions get weaker and are shorter-lasting as time goes on. °¨ The cervix dilates and gets thinner.: The cervix usually does not dilate or become thin. °Follow these instructions at home: °¨ Take over-the-counter and prescription medicines only as told by your health care provider. °¨ Keep up with your usual exercises and follow other instructions from your health care provider. °¨ Eat and drink lightly if you think you are going into labor. °¨ If Braxton Hicks contractions are making you uncomfortable: °¨ Change your position from lying down or resting to walking, or change from walking to resting. °¨ Sit and rest in a tub of warm water. °¨ Drink enough fluid to keep your urine clear or pale yellow. Dehydration may cause these contractions. °¨ Do slow and deep breathing several times an hour. °¨ Keep all follow-up prenatal visits as told by your health care provider. This is important. °Contact a health care provider if: °¨ You have a fever. °¨ You have continuous pain in your abdomen. °Get help right away if: °¨ Your contractions become stronger, more regular, and closer together. °¨ You have fluid leaking or gushing from your vagina. °¨ You pass blood-tinged mucus (bloody show). °¨ You have bleeding from your vagina. °¨ You have low back pain that you never had before. °¨ You feel your baby’s head pushing down and causing pelvic pressure. °¨ Your baby is not moving inside you as much as it used to. °Summary °¨ Contractions that occur before labor are   called Braxton Hicks contractions, false labor, or practice contractions. °¨ Braxton Hicks contractions are usually shorter, weaker, farther apart, and less regular than true labor contractions. True labor contractions usually become progressively stronger and regular and they become more frequent. °¨ Manage discomfort from  Braxton Hicks contractions by changing position, resting in a warm bath, drinking plenty of water, or practicing deep breathing. °This information is not intended to replace advice given to you by your health care provider. Make sure you discuss any questions you have with your health care provider. °Document Released: 03/16/2005 Document Revised: 02/03/2016 Document Reviewed: 02/03/2016 °Elsevier Interactive Patient Education © 2017 [employer] °Fetal Movement Counts °Patient Name: ________________________________________________ Patient Due Date: ____________________ °What is a fetal movement count? °A fetal movement count is the number of times that you feel your baby move during a certain amount of time. This may also be called a fetal kick count. A fetal movement count is recommended for every pregnant woman. You may be asked to start counting fetal movements as early as week 28 of your pregnancy. °Pay attention to when your baby is most active. You may notice your baby's sleep and wake cycles. You may also notice things that make your baby move more. You should do a fetal movement count: °· When your baby is normally most active. °· At the same time each day. °A good time to count movements is while you are resting, after having something to eat and drink. °How do I count fetal movements? °1. Find a quiet, comfortable area. Sit, or lie down on your side. °2. Write down the date, the start time and stop time, and the number of movements that you felt between those two times. Take this information with you to your health care visits. °3. For 2 hours, count kicks, flutters, swishes, rolls, and jabs. You should feel at least 10 movements during 2 hours. °4. You may stop counting after you have felt 10 movements. °5. If you do not feel 10 movements in 2 hours, have something to eat and drink. Then, keep resting and counting for 1 hour. If you feel at least 4 movements during that hour, you may stop  counting. °Contact a health care provider if: °· You feel fewer than 4 movements in 2 hours. °· Your baby is not moving like he or she usually does. °Date: ____________ Start time: ____________ Stop time: ____________ Movements: ____________ °Date: ____________ Start time: ____________ Stop time: ____________ Movements: ____________ °Date: ____________ Start time: ____________ Stop time: ____________ Movements: ____________ °Date: ____________ Start time: ____________ Stop time: ____________ Movements: ____________ °Date: ____________ Start time: ____________ Stop time: ____________ Movements: ____________ °Date: ____________ Start time: ____________ Stop time: ____________ Movements: ____________ °Date: ____________ Start time: ____________ Stop time: ____________ Movements: ____________ °Date: ____________ Start time: ____________ Stop time: ____________ Movements: ____________ °Date: ____________ Start time: ____________ Stop time: ____________ Movements: ____________ °This information is not intended to replace advice given to you by your health care provider. Make sure you discuss any questions you have with your health care provider. °Document Released: 04/15/2006 Document Revised: 11/13/2015 Document Reviewed: 04/25/2015 °Elsevier Interactive Patient Education © 2017 [employer] ° °

## 2016-06-04 ENCOUNTER — Inpatient Hospital Stay (HOSPITAL_COMMUNITY)
Admission: AD | Admit: 2016-06-04 | Discharge: 2016-06-04 | Disposition: A | Discharge status: Home / Self Care | Payer: Medicaid Other | Source: Ambulatory Visit | Attending: Obstetrics and Gynecology | Admitting: Obstetrics and Gynecology

## 2016-06-04 ENCOUNTER — Encounter (HOSPITAL_COMMUNITY): Payer: Self-pay

## 2016-06-04 DIAGNOSIS — Z3A36 36 weeks gestation of pregnancy: Secondary | ICD-10-CM | POA: Insufficient documentation

## 2016-06-04 DIAGNOSIS — O26893 Other specified pregnancy related conditions, third trimester: Secondary | ICD-10-CM

## 2016-06-04 DIAGNOSIS — Z880 Allergy status to penicillin: Secondary | ICD-10-CM | POA: Insufficient documentation

## 2016-06-04 DIAGNOSIS — Z7982 Long term (current) use of aspirin: Secondary | ICD-10-CM | POA: Insufficient documentation

## 2016-06-04 DIAGNOSIS — O4703 False labor before 37 completed weeks of gestation, third trimester: Secondary | ICD-10-CM | POA: Insufficient documentation

## 2016-06-04 DIAGNOSIS — O368130 Decreased fetal movements, third trimester, not applicable or unspecified: Secondary | ICD-10-CM | POA: Insufficient documentation

## 2016-06-04 DIAGNOSIS — N898 Other specified noninflammatory disorders of vagina: Secondary | ICD-10-CM | POA: Diagnosis not present

## 2016-06-04 LAB — URINALYSIS, ROUTINE W REFLEX MICROSCOPIC
BACTERIA UA: NONE SEEN
Bilirubin Urine: NEGATIVE
Glucose, UA: >=500 mg/dL — AB
Hgb urine dipstick: NEGATIVE
Ketones, ur: NEGATIVE mg/dL
Leukocytes, UA: NEGATIVE
Nitrite: NEGATIVE
PROTEIN: NEGATIVE mg/dL
SPECIFIC GRAVITY, URINE: 1.023 (ref 1.005–1.030)
pH: 6 (ref 5.0–8.0)

## 2016-06-04 LAB — GLUCOSE, CAPILLARY: GLUCOSE-CAPILLARY: 149 mg/dL — AB (ref 65–99)

## 2016-06-04 LAB — AMNISURE RUPTURE OF MEMBRANE (ROM) NOT AT ARMC: AMNISURE: NEGATIVE

## 2016-06-04 LAB — POCT FERN TEST: POCT FERN TEST: NEGATIVE

## 2016-06-04 NOTE — MAU Provider Note (Signed)
History     CSN: [id number]  Arrival date and time: 06/04/16 1400    First Provider Initiated Contact with Patient 06/04/16 1448      Chief Complaint  Patient presents with  . Contractions  . Rupture of Membranes  . Decreased Fetal Movement   HPI Sharon Townsend is a 26 y.o. [phone] at [redacted]w[redacted]d who presents with contraction, LOF, & decreased fetal movement. Reports feeling a gush of watery fluid this morning that has continue to trickle out. Describes as clear & watery, without odor. Also reports contractions every 6 minutes since she woke up this morning. Rates pain 9/10. Decreased fetal movement today. Has felt baby move 4 times this afternoon.  Denies vaginal bleeding.   OB History    Gravida Para Term Preterm AB Living   4 2   2 1 1    SAB TAB Ectopic Multiple Live Births   1     0 1      Past Medical History:  Diagnosis Date  . Bipolar disorder (HCC)   . Chronic constipation   . Gall bladder stones   . Renal disorder    two tubes in left kidney going to bladder    Past Surgical History:  Procedure Laterality Date  . CHOLECYSTECTOMY    . MANDIBLE SURGERY    . renal stents    . TONSILLECTOMY      Family History  Problem Relation Age of Onset  . Diabetes Mother   . Diabetes Father   . Hypertension Father     Social History  Substance Use Topics  . Smoking status: Never Smoker  . Smokeless tobacco: Never Used  . Alcohol use No     Comment: before preg    Allergies:  Allergies  Allergen Reactions  . Peanuts [Peanut Oil] Anaphylaxis  . Latex Swelling  . Penicillins Other (See Comments)    Reaction:  Pt states that it "shuts down her bowels and cannot urinate" Has patient had a PCN reaction causing immediate rash, facial/tongue/throat swelling, SOB or lightheadedness with hypotension: No Has patient had a PCN reaction causing severe rash involving mucus membranes or skin necrosis: No Has patient had a PCN reaction that required hospitalization No Has  patient had a PCN reaction occurring within the last 10 years: No If all of the above answers are "NO", then may proceed with Cephalosp    Facility-Administered Medications Prior to Admission  Medication Dose Route Frequency Provider Last Rate Last Dose  . EPINEPHrine (EPI-PEN) injection 0.3 mg  0.3 mg Subcutaneous Once Duane Lope, NP       Prescriptions Prior to Admission  Medication Sig Dispense Refill Last Dose  . acetaminophen (TYLENOL) 500 MG tablet Take 1,000 mg by mouth every 6 (six) hours as needed for headache.   05/20/2016 at Unknown time  . aspirin EC 81 MG tablet Take 81 mg by mouth daily.   05/31/2016 at Unknown time  . butalbital-acetaminophen-caffeine (FIORICET) 50-325-40 MG tablet Take 1-2 tablets by mouth every 6 (six) hours as needed for headache. 30 tablet 1 05/31/2016 at Unknown time  . cyclobenzaprine (FLEXERIL) 10 MG tablet Take 1 tablet (10 mg total) by mouth 2 (two) times daily as needed for muscle spasms. 20 tablet 0 05/31/2016 at Unknown time  . oxycodone-acetaminophen (PERCOCET) 2.5-325 MG tablet Take 1 tablet by mouth every 4 (four) hours as needed for pain. 30 tablet 0 05/31/2016 at Unknown time  . Prenatal Vit-Fe Fumarate-FA (PRENATAL MULTIVITAMIN) TABS tablet Take 1  tablet by mouth daily at 12 noon.   05/20/2016 at Unknown time    Review of Systems  Constitutional: Negative.   Gastrointestinal: Positive for abdominal pain. Negative for constipation, diarrhea, nausea and vomiting.  Genitourinary: Positive for vaginal discharge. Negative for vaginal bleeding.   Physical Exam   Blood pressure 116/70, pulse 100, temperature 98.1 F (36.7 C), temperature source Oral, resp. rate 18, last menstrual period 09/24/2015, SpO2 100 %, currently breastfeeding.  Physical Exam  Nursing note and vitals reviewed. Constitutional: She is oriented to person, place, and time. She appears well-developed and well-nourished. No distress.  HENT:  Head: Normocephalic and atraumatic.   Eyes: Conjunctivae are normal. Right eye exhibits no discharge. Left eye exhibits no discharge. No scleral icterus.  Neck: Normal range of motion.  Respiratory: Effort normal. No respiratory distress.  Genitourinary: No bleeding in the vagina.  Genitourinary Comments: Small amount of white mucoid discharge. No pooling.   Neurological: She is alert and oriented to person, place, and time.  Skin: Skin is warm and dry. She is not diaphoretic.  Psychiatric: She has a normal mood and affect. Her behavior is normal. Judgment and thought content normal.   Dilation: 1 Effacement (%): 50 Station: -3 Exam by:: Sheryn Bisononya Austin RN  Fetal Tracing:  Baseline: 135 Variability: moderate Accelerations: 15x15 Decelerations: none  Toco: irr ctx MAU Course  Procedures Results for orders placed or performed during the hospital encounter of 06/04/16 (from the past 24 hour(s))  Urinalysis, Routine w reflex microscopic     Status: Abnormal   Collection Time: 06/04/16  2:15 PM  Result Value Ref Range   Color, Urine YELLOW YELLOW   APPearance HAZY (A) CLEAR   Specific Gravity, Urine 1.023 1.005 - 1.030   pH 6.0 5.0 - 8.0   Glucose, UA >=500 (A) NEGATIVE mg/dL   Hgb urine dipstick NEGATIVE NEGATIVE   Bilirubin Urine NEGATIVE NEGATIVE   Ketones, ur NEGATIVE NEGATIVE mg/dL   Protein, ur NEGATIVE NEGATIVE mg/dL   Nitrite NEGATIVE NEGATIVE   Leukocytes, UA NEGATIVE NEGATIVE   RBC / HPF 0-5 0 - 5 RBC/hpf   WBC, UA 0-5 0 - 5 WBC/hpf   Bacteria, UA NONE SEEN NONE SEEN   Squamous Epithelial / LPF 0-5 (A) NONE SEEN  POCT fern test     Status: None   Collection Time: 06/04/16  3:13 PM  Result Value Ref Range   POCT Fern Test Negative = intact amniotic membranes   Glucose, capillary     Status: Abnormal   Collection Time: 06/04/16  3:16 PM  Result Value Ref Range   Glucose-Capillary 149 (H) 65 - 99 mg/dL  [facility]     Status: None   Collection Time: 06/04/16  3:26 PM   Result Value Ref Range   Amnisure ROM NEGATIVE     MDM Reactive fetal tracing Irr ctx on toco SVE 1/thick No pooling, fern & amnisure negative S/w Dr. Tenny Crawoss. Discussed assessment, labs, & FHT. Ok to discharge home Assessment and Plan  A: 1. Vaginal discharge during pregnancy in third trimester   2. False labor before 37 completed weeks of gestation in third trimester    P: Discharge home Discussed reasons to return to MAU Keep follow up appointment with OB/PCP   Judeth HornErin Lory Nowaczyk 06/04/2016, 2:47 PM

## 2016-06-04 NOTE — Discharge Instructions (Signed)
Braxton Hicks Contractions °Contractions of the uterus can occur throughout pregnancy, but they are not always a sign that you are in labor. You may have practice contractions called Braxton Hicks contractions. These false labor contractions are sometimes confused with true labor. °What are Braxton Hicks contractions? °Braxton Hicks contractions are tightening movements that occur in the muscles of the uterus before labor. Unlike true labor contractions, these contractions do not result in opening (dilation) and thinning of the cervix. Toward the end of pregnancy (32-34 weeks), Braxton Hicks contractions can happen more often and may become stronger. These contractions are sometimes difficult to tell apart from true labor because they can be very uncomfortable. You should not feel embarrassed if you go to the hospital with false labor. °Sometimes, the only way to tell if you are in true labor is for your health care provider to look for changes in the cervix. The health care provider will do a physical exam and may monitor your contractions. If you are not in true labor, the exam should show that your cervix is not dilating and your water has not broken. °If there are no prenatal problems or other health problems associated with your pregnancy, it is completely safe for you to be sent home with false labor. You may continue to have Braxton Hicks contractions until you go into true labor. °How can I tell the difference between true labor and false labor? °· Differences °¨ False labor °¨ Contractions last 30-70 seconds.: Contractions are usually shorter and not as strong as true labor contractions. °¨ Contractions become very regular.: Contractions are usually irregular. °¨ Discomfort is usually felt in the top of the uterus, and it spreads to the lower abdomen and low back.: Contractions are often felt in the front of the lower abdomen and in the groin. °¨ Contractions do not go away with walking.: Contractions may  go away when you walk around or change positions while lying down. °¨ Contractions usually become more intense and increase in frequency.: Contractions get weaker and are shorter-lasting as time goes on. °¨ The cervix dilates and gets thinner.: The cervix usually does not dilate or become thin. °Follow these instructions at home: °¨ Take over-the-counter and prescription medicines only as told by your health care provider. °¨ Keep up with your usual exercises and follow other instructions from your health care provider. °¨ Eat and drink lightly if you think you are going into labor. °¨ If Braxton Hicks contractions are making you uncomfortable: °¨ Change your position from lying down or resting to walking, or change from walking to resting. °¨ Sit and rest in a tub of warm water. °¨ Drink enough fluid to keep your urine clear or pale yellow. Dehydration may cause these contractions. °¨ Do slow and deep breathing several times an hour. °¨ Keep all follow-up prenatal visits as told by your health care provider. This is important. °Contact a health care provider if: °¨ You have a fever. °¨ You have continuous pain in your abdomen. °Get help right away if: °¨ Your contractions become stronger, more regular, and closer together. °¨ You have fluid leaking or gushing from your vagina. °¨ You pass blood-tinged mucus (bloody show). °¨ You have bleeding from your vagina. °¨ You have low back pain that you never had before. °¨ You feel your baby’s head pushing down and causing pelvic pressure. °¨ Your baby is not moving inside you as much as it used to. °Summary °¨ Contractions that occur before labor are   called Braxton Hicks contractions, false labor, or practice contractions. °¨ Braxton Hicks contractions are usually shorter, weaker, farther apart, and less regular than true labor contractions. True labor contractions usually become progressively stronger and regular and they become more frequent. °¨ Manage discomfort from  Braxton Hicks contractions by changing position, resting in a warm bath, drinking plenty of water, or practicing deep breathing. °This information is not intended to replace advice given to you by your health care provider. Make sure you discuss any questions you have with your health care provider. °Document Released: 03/16/2005 Document Revised: 02/03/2016 Document Reviewed: 02/03/2016 °Elsevier Interactive Patient Education © 2017 [employer] °Fetal Movement Counts °Patient Name: ________________________________________________ Patient Due Date: ____________________ °What is a fetal movement count? °A fetal movement count is the number of times that you feel your baby move during a certain amount of time. This may also be called a fetal kick count. A fetal movement count is recommended for every pregnant woman. You may be asked to start counting fetal movements as early as week 28 of your pregnancy. °Pay attention to when your baby is most active. You may notice your baby's sleep and wake cycles. You may also notice things that make your baby move more. You should do a fetal movement count: °· When your baby is normally most active. °· At the same time each day. °A good time to count movements is while you are resting, after having something to eat and drink. °How do I count fetal movements? °1. Find a quiet, comfortable area. Sit, or lie down on your side. °2. Write down the date, the start time and stop time, and the number of movements that you felt between those two times. Take this information with you to your health care visits. °3. For 2 hours, count kicks, flutters, swishes, rolls, and jabs. You should feel at least 10 movements during 2 hours. °4. You may stop counting after you have felt 10 movements. °5. If you do not feel 10 movements in 2 hours, have something to eat and drink. Then, keep resting and counting for 1 hour. If you feel at least 4 movements during that hour, you may stop  counting. °Contact a health care provider if: °· You feel fewer than 4 movements in 2 hours. °· Your baby is not moving like he or she usually does. °Date: ____________ Start time: ____________ Stop time: ____________ Movements: ____________ °Date: ____________ Start time: ____________ Stop time: ____________ Movements: ____________ °Date: ____________ Start time: ____________ Stop time: ____________ Movements: ____________ °Date: ____________ Start time: ____________ Stop time: ____________ Movements: ____________ °Date: ____________ Start time: ____________ Stop time: ____________ Movements: ____________ °Date: ____________ Start time: ____________ Stop time: ____________ Movements: ____________ °Date: ____________ Start time: ____________ Stop time: ____________ Movements: ____________ °Date: ____________ Start time: ____________ Stop time: ____________ Movements: ____________ °Date: ____________ Start time: ____________ Stop time: ____________ Movements: ____________ °This information is not intended to replace advice given to you by your health care provider. Make sure you discuss any questions you have with your health care provider. °Document Released: 04/15/2006 Document Revised: 11/13/2015 Document Reviewed: 04/25/2015 °Elsevier Interactive Patient Education © 2017 [employer] ° °

## 2016-06-04 NOTE — MAU Note (Signed)
Pt C/O contractions since 0600 this a.m., had gush of clear fluid around 0800, has been trickling since.  Denies bleeding.  Decreased FM for the last 2-3 hours.

## 2016-06-05 ENCOUNTER — Encounter (HOSPITAL_COMMUNITY): Payer: Self-pay

## 2016-06-05 ENCOUNTER — Inpatient Hospital Stay (HOSPITAL_COMMUNITY)
Admission: AD | Admit: 2016-06-05 | Discharge: 2016-06-05 | Disposition: A | Discharge status: Home / Self Care | Payer: Medicaid Other | Source: Ambulatory Visit | Attending: Obstetrics & Gynecology | Admitting: Obstetrics & Gynecology

## 2016-06-05 DIAGNOSIS — F319 Bipolar disorder, unspecified: Secondary | ICD-10-CM | POA: Insufficient documentation

## 2016-06-05 DIAGNOSIS — R51 Headache: Secondary | ICD-10-CM | POA: Insufficient documentation

## 2016-06-05 DIAGNOSIS — Z79899 Other long term (current) drug therapy: Secondary | ICD-10-CM | POA: Diagnosis not present

## 2016-06-05 DIAGNOSIS — Z880 Allergy status to penicillin: Secondary | ICD-10-CM | POA: Insufficient documentation

## 2016-06-05 DIAGNOSIS — O479 False labor, unspecified: Secondary | ICD-10-CM

## 2016-06-05 DIAGNOSIS — O99343 Other mental disorders complicating pregnancy, third trimester: Secondary | ICD-10-CM | POA: Insufficient documentation

## 2016-06-05 DIAGNOSIS — R42 Dizziness and giddiness: Secondary | ICD-10-CM | POA: Insufficient documentation

## 2016-06-05 DIAGNOSIS — O4703 False labor before 37 completed weeks of gestation, third trimester: Secondary | ICD-10-CM | POA: Diagnosis not present

## 2016-06-05 DIAGNOSIS — Z3A36 36 weeks gestation of pregnancy: Secondary | ICD-10-CM | POA: Diagnosis not present

## 2016-06-05 LAB — PROTEIN / CREATININE RATIO, URINE
CREATININE, URINE: 73 mg/dL
PROTEIN CREATININE RATIO: 0.21 mg/mg{creat} — AB (ref 0.00–0.15)
TOTAL PROTEIN, URINE: 15 mg/dL

## 2016-06-05 LAB — COMPREHENSIVE METABOLIC PANEL
ALT: 12 U/L — ABNORMAL LOW (ref 14–54)
AST: 19 U/L (ref 15–41)
Albumin: 2.9 g/dL — ABNORMAL LOW (ref 3.5–5.0)
Alkaline Phosphatase: 149 U/L — ABNORMAL HIGH (ref 38–126)
Anion gap: 10 (ref 5–15)
BUN: 7 mg/dL (ref 6–20)
CHLORIDE: 103 mmol/L (ref 101–111)
CO2: 20 mmol/L — ABNORMAL LOW (ref 22–32)
Calcium: 8.7 mg/dL — ABNORMAL LOW (ref 8.9–10.3)
Creatinine, Ser: 0.67 mg/dL (ref 0.44–1.00)
Glucose, Bld: 133 mg/dL — ABNORMAL HIGH (ref 65–99)
POTASSIUM: 3.6 mmol/L (ref 3.5–5.1)
Sodium: 133 mmol/L — ABNORMAL LOW (ref 135–145)
Total Bilirubin: 0.5 mg/dL (ref 0.3–1.2)
Total Protein: 6.8 g/dL (ref 6.5–8.1)

## 2016-06-05 LAB — URINALYSIS, ROUTINE W REFLEX MICROSCOPIC
Bilirubin Urine: NEGATIVE
Glucose, UA: >=500 mg/dL — AB
Hgb urine dipstick: NEGATIVE
KETONES UR: NEGATIVE mg/dL
LEUKOCYTES UA: NEGATIVE
Nitrite: NEGATIVE
PH: 6 (ref 5.0–8.0)
Protein, ur: NEGATIVE mg/dL
Specific Gravity, Urine: 1.008 (ref 1.005–1.030)

## 2016-06-05 LAB — CBC
HCT: 31.2 % — ABNORMAL LOW (ref 36.0–46.0)
Hemoglobin: 9.6 g/dL — ABNORMAL LOW (ref 12.0–15.0)
MCH: 23.2 pg — ABNORMAL LOW (ref 26.0–34.0)
MCHC: 30.8 g/dL (ref 30.0–36.0)
MCV: 75.4 fL — AB (ref 78.0–100.0)
PLATELETS: 125 10*3/uL — AB (ref 150–400)
RBC: 4.14 MIL/uL (ref 3.87–5.11)
RDW: 15.8 % — ABNORMAL HIGH (ref 11.5–15.5)
WBC: 8.1 10*3/uL (ref 4.0–10.5)

## 2016-06-05 LAB — GLUCOSE, CAPILLARY: Glucose-Capillary: 135 mg/dL — ABNORMAL HIGH (ref 65–99)

## 2016-06-05 MED ORDER — BUTALBITAL-APAP-CAFFEINE 50-325-40 MG PO TABS: 1.0000 | ORAL_TABLET | Freq: Four times a day (QID) | ORAL | 0 refills | Status: DC | PRN

## 2016-06-05 MED ORDER — BUTALBITAL-APAP-CAFFEINE 50-325-40 MG PO TABS
1.0000 | ORAL_TABLET | ORAL | Status: DC | PRN
  Administered 2016-06-05: 1 via ORAL
  Filled 2016-06-05: qty 1

## 2016-06-05 NOTE — MAU Note (Signed)
Patient presents with contractions every 3 minutes, more pressure, onset of dizziness and headache today.

## 2016-06-05 NOTE — Discharge Instructions (Signed)

## 2016-06-05 NOTE — MAU Provider Note (Signed)
Patient Toya SmothersBrittany Cruise is a 26 year old [id number] at 36 weeks and 3 days here with complaints of headache and contractions. She was seen in the MAU yesterday afternoon and discharged home after she was found to be 1 cm, 50/-3. Amnisure negative. She has 16 MAU visits in the past 6 months.  She denies bleeding, leaking of fluid and decreased fetal movements.   She says she has felt nauseated today; ate a [employer] sandwich on the way to MAU.  Patient is sitting calmly in bed using her phone. Does not appear in any distress or pain.  History     CSN: [id number]  Arrival date and time: 06/05/16 1813   First Provider Initiated Contact with Patient 06/05/16 1854      Chief Complaint  Patient presents with  . Contractions  . Dizziness  . Headache   Abdominal Pain  This is a new problem. The current episode started yesterday. The onset quality is gradual. The problem occurs intermittently. The problem has been gradually worsening. The pain is located in the periumbilical region and suprapubic region. The pain is at a severity of 9/10. The pain is moderate. The quality of the pain is cramping. The abdominal pain does not radiate. Associated symptoms include headaches. Pertinent negatives include no constipation, diarrhea, dysuria, fever, hematuria, nausea or vomiting. Nothing aggravates the pain. The pain is relieved by nothing. She has tried nothing for the symptoms.  Headache   This is a new problem. The current episode started today. The problem occurs constantly. The pain is located in the frontal region. The pain does not radiate. The pain is at a severity of 9/10. Associated symptoms include abdominal pain. Pertinent negatives include no blurred vision, dizziness, fever, nausea, photophobia, seizures, visual change or vomiting.    OB History    Gravida Para Term Preterm AB Living   4 2   2 1 1    SAB TAB Ectopic Multiple Live Births   1     0 1      Past Medical History:  Diagnosis Date  .  Bipolar disorder (HCC)   . Chronic constipation   . Gall bladder stones   . Renal disorder    two tubes in left kidney going to bladder    Past Surgical History:  Procedure Laterality Date  . CHOLECYSTECTOMY    . MANDIBLE SURGERY    . renal stents    . TONSILLECTOMY      Family History  Problem Relation Age of Onset  . Diabetes Mother   . Diabetes Father   . Hypertension Father     Social History  Substance Use Topics  . Smoking status: Never Smoker  . Smokeless tobacco: Never Used  . Alcohol use No     Comment: before preg    Allergies:  Allergies  Allergen Reactions  . Peanuts [Peanut Oil] Anaphylaxis  . Latex Swelling  . Penicillins Other (See Comments)    Reaction:  Pt states that it "shuts down her bowels and cannot urinate" Has patient had a PCN reaction causing immediate rash, facial/tongue/throat swelling, SOB or lightheadedness with hypotension: No Has patient had a PCN reaction causing severe rash involving mucus membranes or skin necrosis: No Has patient had a PCN reaction that required hospitalization No Has patient had a PCN reaction occurring within the last 10 years: No If all of the above answers are "NO", then may proceed with Cephalosp    Facility-Administered Medications Prior to Admission  Medication Dose  Route Frequency Provider Last Rate Last Dose  . EPINEPHrine (EPI-PEN) injection 0.3 mg  0.3 mg Subcutaneous Once Duane Lope, NP       Prescriptions Prior to Admission  Medication Sig Dispense Refill Last Dose  . butalbital-acetaminophen-caffeine (FIORICET) 50-325-40 MG tablet Take 1-2 tablets by mouth every 6 (six) hours as needed for headache. (Patient not taking: Reported on 06/04/2016) 30 tablet 1 Not Taking at Unknown time  . cyclobenzaprine (FLEXERIL) 10 MG tablet Take 1 tablet (10 mg total) by mouth 2 (two) times daily as needed for muscle spasms. (Patient not taking: Reported on 06/04/2016) 20 tablet 0 Not Taking at Unknown time  .  oxycodone-acetaminophen (PERCOCET) 2.5-325 MG tablet Take 1 tablet by mouth every 4 (four) hours as needed for pain. 30 tablet 0 06/02/2016  . Prenatal Vit-Fe Fumarate-FA (PRENATAL MULTIVITAMIN) TABS tablet Take 1 tablet by mouth daily at 12 noon.   06/04/2016 at Unknown time    Review of Systems  Constitutional: Negative.  Negative for fever.  HENT: Negative.   Eyes: Negative.  Negative for blurred vision and photophobia.  Respiratory: Negative.   Cardiovascular: Negative.   Gastrointestinal: Positive for abdominal pain. Negative for constipation, diarrhea, nausea and vomiting.  Endocrine: Negative.   Genitourinary: Negative.  Negative for dysuria and hematuria.  Musculoskeletal: Negative.   Allergic/Immunologic: Negative.   Neurological: Positive for headaches. Negative for dizziness and seizures.  Hematological: Negative.   Psychiatric/Behavioral: Negative.    Physical Exam   Blood pressure 123/83, pulse 107, temperature 98.1 F (36.7 C), resp. rate 18, height 5' (1.524 m), weight 86.2 kg (190 lb), last menstrual period 09/24/2015, SpO2 100 %, currently breastfeeding.  Physical Exam  Constitutional: She is oriented to person, place, and time. She appears well-developed and well-nourished.  HENT:  Head: Normocephalic.  Neck: Normal range of motion.  Respiratory: Effort normal.  GI: Soft. Bowel sounds are normal.  Genitourinary:  Genitourinary Comments: NEFG; cervix is 2/50/-2. No CMT, no suprapubic tenderness.   Musculoskeletal: Normal range of motion.  Neurological: She is alert and oriented to person, place, and time.  Skin: Skin is warm and dry.  Contractions are moderate to palpation.   MAU Course  Procedures  MDM -fioricet for HA; patient feels better after fioricet.  -CBC and CMP negative -UPC 0.21 -Blood pressures normal in MAU -NST: 135 bpm, + acel, - decl, moderate variability. Reactive.  Vitals:   06/05/16 1905 06/05/16 1920  BP: 120/77 123/83  Pulse: 115 107   Resp:    Temp:      Assessment and Plan   1. False labor    2. Patient stable for discharge. Encouraged rest and hydration.  Reviewed when to retrurn to the MAU (bleeding, leaking of fluid, decreased fetal movements). Plan of care discussed with Dr. Mora Appl, who agrees.   Charlesetta Garibaldi Eivan Gallina CNM 06/05/2016, 7:30 PM

## 2016-06-06 ENCOUNTER — Inpatient Hospital Stay (HOSPITAL_COMMUNITY)
Admission: AD | Admit: 2016-06-06 | Discharge: 2016-06-07 | Disposition: A | Discharge status: Home / Self Care | Payer: Medicaid Other | Source: Ambulatory Visit | Attending: Obstetrics & Gynecology | Admitting: Obstetrics & Gynecology

## 2016-06-06 ENCOUNTER — Encounter (HOSPITAL_COMMUNITY): Payer: Self-pay | Admitting: *Deleted

## 2016-06-06 DIAGNOSIS — O4703 False labor before 37 completed weeks of gestation, third trimester: Secondary | ICD-10-CM | POA: Diagnosis present

## 2016-06-06 DIAGNOSIS — O479 False labor, unspecified: Secondary | ICD-10-CM

## 2016-06-06 DIAGNOSIS — Z3A00 Weeks of gestation of pregnancy not specified: Secondary | ICD-10-CM | POA: Insufficient documentation

## 2016-06-06 HISTORY — DX: Headache, unspecified: R51.9

## 2016-06-06 HISTORY — DX: Headache: R51

## 2016-06-06 NOTE — Progress Notes (Addendum)
Dr Mora ApplPinn notiifed of pt's admission and status. Aware of ctx pattern, sve, b/p readings. REactive FHR. Aware pt c/o of headache but has hx of headaches and has take Fioricet today but did not help. Pt stable for d/c home

## 2016-06-06 NOTE — MAU Note (Signed)
I have communicated with Dr Mora ApplPinn and reviewed vital signs:  Vitals:   06/06/16 2309 06/06/16 2323  BP: 124/82   Pulse: 117 112  Resp: 20   Temp: 98.1 F (36.7 C)     Vaginal exam:  Dilation: 2 Effacement (%): Thick Cervical Position: Posterior Station: -3 Presentation: Vertex Exam by:: Quintella BatonJo Arabell Neria RNC,   Also reviewed contraction pattern and that non-stress test is reactive.  It has been documented that patient is contracting every 2-4 minutes with no cervical change not indicating active labor.    Based on this report provider has given order for discharge.  A discharge order and diagnosis entered by a provider.   Labor discharge instructions reviewed with patient.

## 2016-06-06 NOTE — MAU Note (Signed)
Contractions since 0130. Denies LOF or bleeding

## 2016-06-07 DIAGNOSIS — O4703 False labor before 37 completed weeks of gestation, third trimester: Secondary | ICD-10-CM | POA: Diagnosis present

## 2016-06-07 NOTE — Discharge Instructions (Signed)

## 2016-06-07 NOTE — Progress Notes (Signed)
Written and verbal d/c instructions given and understanding voiced. 

## 2016-06-08 ENCOUNTER — Inpatient Hospital Stay (HOSPITAL_COMMUNITY)
Admission: AD | Admit: 2016-06-08 | Discharge: 2016-06-08 | Disposition: A | Discharge status: Home / Self Care | Payer: Medicaid Other | Source: Ambulatory Visit | Attending: Obstetrics and Gynecology | Admitting: Obstetrics and Gynecology

## 2016-06-08 ENCOUNTER — Encounter (HOSPITAL_COMMUNITY): Payer: Self-pay | Admitting: *Deleted

## 2016-06-08 DIAGNOSIS — O479 False labor, unspecified: Secondary | ICD-10-CM

## 2016-06-08 DIAGNOSIS — O4703 False labor before 37 completed weeks of gestation, third trimester: Secondary | ICD-10-CM

## 2016-06-08 MED ORDER — ZOLPIDEM TARTRATE 5 MG PO TABS
5.0000 mg | ORAL_TABLET | Freq: Once | ORAL | Status: AC
  Administered 2016-06-08: 5 mg via ORAL
  Filled 2016-06-08: qty 1

## 2016-06-08 NOTE — MAU Note (Signed)
I have communicated with Dr. Claiborne Billingsallahan and reviewed vital signs:  Vitals:   06/08/16 1920  BP: 126/85  Pulse: 99  Resp: 16  Temp: 98.4 F (36.9 C)    Vaginal exam:  Dilation: 2 Effacement (%): 50 Cervical Position: Posterior Station: -3 Presentation: Vertex Exam by:: K. Marijo FileWeissRn,   Also reviewed contraction pattern and that non-stress test is reactive.  It has been documented that patient is contracting every 2-2.5 minutes with no cervical change over past one hour, not indicating active labor.  Patient denies any other complaints.  Based on this report provider has given order for discharge.  A discharge order and diagnosis entered by a provider.   Labor discharge instructions reviewed with patient.

## 2016-06-08 NOTE — Discharge Instructions (Signed)

## 2016-06-09 ENCOUNTER — Encounter (HOSPITAL_COMMUNITY): Payer: Self-pay | Admitting: *Deleted

## 2016-06-09 ENCOUNTER — Other Ambulatory Visit (HOSPITAL_COMMUNITY): Payer: Self-pay | Admitting: Obstetrics and Gynecology

## 2016-06-09 ENCOUNTER — Inpatient Hospital Stay (HOSPITAL_COMMUNITY)
Admission: AD | Admit: 2016-06-09 | Discharge: 2016-06-09 | DRG: 781 | Disposition: A | Discharge status: Home / Self Care | Payer: Medicaid Other | Source: Ambulatory Visit | Attending: Obstetrics and Gynecology | Admitting: Obstetrics and Gynecology

## 2016-06-09 ENCOUNTER — Inpatient Hospital Stay (HOSPITAL_COMMUNITY)
Admission: AD | Admit: 2016-06-09 | Discharge: 2016-06-09 | Disposition: A | Discharge status: Home / Self Care | Payer: Medicaid Other | Source: Ambulatory Visit | Attending: Obstetrics and Gynecology | Admitting: Obstetrics and Gynecology

## 2016-06-09 ENCOUNTER — Encounter (HOSPITAL_COMMUNITY): Payer: Medicaid Other

## 2016-06-09 DIAGNOSIS — O26893 Other specified pregnancy related conditions, third trimester: Secondary | ICD-10-CM | POA: Diagnosis not present

## 2016-06-09 DIAGNOSIS — Z3A37 37 weeks gestation of pregnancy: Secondary | ICD-10-CM

## 2016-06-09 DIAGNOSIS — O1493 Unspecified pre-eclampsia, third trimester: Secondary | ICD-10-CM | POA: Diagnosis present

## 2016-06-09 DIAGNOSIS — R51 Headache: Secondary | ICD-10-CM | POA: Diagnosis present

## 2016-06-09 DIAGNOSIS — O4703 False labor before 37 completed weeks of gestation, third trimester: Secondary | ICD-10-CM | POA: Diagnosis not present

## 2016-06-09 DIAGNOSIS — Z79899 Other long term (current) drug therapy: Secondary | ICD-10-CM | POA: Insufficient documentation

## 2016-06-09 DIAGNOSIS — O9989 Other specified diseases and conditions complicating pregnancy, childbirth and the puerperium: Secondary | ICD-10-CM | POA: Diagnosis not present

## 2016-06-09 DIAGNOSIS — K5909 Other constipation: Secondary | ICD-10-CM | POA: Diagnosis present

## 2016-06-09 DIAGNOSIS — G4452 New daily persistent headache (NDPH): Secondary | ICD-10-CM

## 2016-06-09 DIAGNOSIS — Z880 Allergy status to penicillin: Secondary | ICD-10-CM | POA: Insufficient documentation

## 2016-06-09 DIAGNOSIS — H538 Other visual disturbances: Secondary | ICD-10-CM | POA: Insufficient documentation

## 2016-06-09 LAB — COMPREHENSIVE METABOLIC PANEL
ALBUMIN: 2.8 g/dL — AB (ref 3.5–5.0)
ALK PHOS: 154 U/L — AB (ref 38–126)
ALT: 12 U/L — AB (ref 14–54)
ANION GAP: 11 (ref 5–15)
AST: 22 U/L (ref 15–41)
BUN: 8 mg/dL (ref 6–20)
CALCIUM: 8.9 mg/dL (ref 8.9–10.3)
CO2: 19 mmol/L — AB (ref 22–32)
Chloride: 105 mmol/L (ref 101–111)
Creatinine, Ser: 0.67 mg/dL (ref 0.44–1.00)
GFR calc Af Amer: >60 mL/min (ref >60)
GFR calc non Af Amer: >60 mL/min (ref >60)
GLUCOSE: 164 mg/dL — AB (ref 65–99)
Potassium: 3.9 mmol/L (ref 3.5–5.1)
SODIUM: 135 mmol/L (ref 135–145)
Total Bilirubin: 0.2 mg/dL — ABNORMAL LOW (ref 0.3–1.2)
Total Protein: 6.2 g/dL — ABNORMAL LOW (ref 6.5–8.1)

## 2016-06-09 LAB — URINALYSIS, COMPLETE (UACMP) WITH MICROSCOPIC
BILIRUBIN URINE: NEGATIVE
HGB URINE DIPSTICK: NEGATIVE
KETONES UR: NEGATIVE mg/dL
NITRITE: NEGATIVE
PH: 6 (ref 5.0–8.0)
Protein, ur: NEGATIVE mg/dL
SPECIFIC GRAVITY, URINE: 1.006 (ref 1.005–1.030)

## 2016-06-09 LAB — CBC
HCT: 31.2 % — ABNORMAL LOW (ref 36.0–46.0)
HEMOGLOBIN: 9.8 g/dL — AB (ref 12.0–15.0)
MCH: 23.3 pg — ABNORMAL LOW (ref 26.0–34.0)
MCHC: 31.4 g/dL (ref 30.0–36.0)
MCV: 74.1 fL — ABNORMAL LOW (ref 78.0–100.0)
Platelets: 150 10*3/uL (ref 150–400)
RBC: 4.21 MIL/uL (ref 3.87–5.11)
RDW: 15.9 % — ABNORMAL HIGH (ref 11.5–15.5)
WBC: 8.5 10*3/uL (ref 4.0–10.5)

## 2016-06-09 LAB — PROTEIN / CREATININE RATIO, URINE
Creatinine, Urine: 152 mg/dL
PROTEIN CREATININE RATIO: 0.35 mg/mg{creat} — AB (ref 0.00–0.15)
TOTAL PROTEIN, URINE: 53 mg/dL

## 2016-06-09 MED ORDER — LACTATED RINGERS IV SOLN: INTRAVENOUS | Status: DC

## 2016-06-09 MED ORDER — LACTATED RINGERS IV SOLN
INTRAVENOUS | Status: DC
  Administered 2016-06-09: 22:00:00 via INTRAVENOUS

## 2016-06-09 MED ORDER — OXYCODONE-ACETAMINOPHEN 5-325 MG PO TABS: 2.0000 | ORAL_TABLET | ORAL | Status: DC | PRN

## 2016-06-09 MED ORDER — LACTATED RINGERS IV SOLN: 500.0000 mL | INTRAVENOUS | Status: DC | PRN

## 2016-06-09 MED ORDER — ZOLPIDEM TARTRATE ER 12.5 MG PO TBCR: 12.5000 mg | EXTENDED_RELEASE_TABLET | Freq: Every evening | ORAL | 0 refills | Status: DC | PRN

## 2016-06-09 MED ORDER — BUTALBITAL-APAP-CAFFEINE 50-325-40 MG PO TABS
2.0000 | ORAL_TABLET | ORAL | Status: DC | PRN
  Administered 2016-06-09: 2 via ORAL
  Filled 2016-06-09: qty 2

## 2016-06-09 MED ORDER — ACETAMINOPHEN 325 MG PO TABS: 650.0000 mg | ORAL_TABLET | ORAL | Status: DC | PRN

## 2016-06-09 MED ORDER — OXYTOCIN BOLUS FROM INFUSION: 500.0000 mL | Freq: Once | INTRAVENOUS | Status: DC

## 2016-06-09 MED ORDER — SOD CITRATE-CITRIC ACID 500-334 MG/5ML PO SOLN: 30.0000 mL | ORAL | Status: DC | PRN

## 2016-06-09 MED ORDER — LACTATED RINGERS IV SOLN
INTRAVENOUS | Status: DC
  Filled 2016-06-09: qty 1000

## 2016-06-09 MED ORDER — BUTORPHANOL TARTRATE 1 MG/ML IJ SOLN
1.0000 mg | Freq: Once | INTRAMUSCULAR | Status: AC
  Administered 2016-06-09: 1 mg via INTRAMUSCULAR
  Filled 2016-06-09: qty 1

## 2016-06-09 MED ORDER — OXYCODONE-ACETAMINOPHEN 5-325 MG PO TABS: 1.0000 | ORAL_TABLET | ORAL | Status: DC | PRN

## 2016-06-09 MED ORDER — FLEET ENEMA 7-19 GM/118ML RE ENEM: 1.0000 | ENEMA | RECTAL | Status: DC | PRN

## 2016-06-09 MED ORDER — LIDOCAINE HCL (PF) 1 % IJ SOLN: 30.0000 mL | INTRAMUSCULAR | Status: DC | PRN

## 2016-06-09 MED ORDER — OXYTOCIN 40 UNITS IN LACTATED RINGERS INFUSION - SIMPLE MED: 2.5000 [IU]/h | INTRAVENOUS | Status: DC

## 2016-06-09 MED ORDER — ONDANSETRON HCL 4 MG/2ML IJ SOLN: 4.0000 mg | Freq: Four times a day (QID) | INTRAMUSCULAR | Status: DC | PRN

## 2016-06-09 NOTE — Discharge Instructions (Signed)
General Headache Without Cause A headache is pain or discomfort felt around the head or neck area. There are many causes and types of headaches. In some cases, the cause may not be found. Follow these instructions at home: Managing pain   Take over-the-counter and prescription medicines only as told by your doctor.  Lie down in a dark, quiet room when you have a headache.  If directed, apply ice to the head and neck area:  Put ice in a plastic bag.  Place a towel between your skin and the bag.  Leave the ice on for 20 minutes, 2-3 times per day.  Use a heating pad or hot shower to apply heat to the head and neck area as told by your doctor.  Keep lights dim if bright lights bother you or make your headaches worse. Eating and drinking   Eat meals on a regular schedule.  Lessen how much alcohol you drink.  Lessen how much caffeine you drink, or stop drinking caffeine. General instructions   Keep all follow-up visits as told by your doctor. This is important.  Keep a journal to find out if certain things bring on headaches. For example, write down:  What you eat and drink.  How much sleep you get.  Any change to your diet or medicines.  Relax by getting a massage or doing other relaxing activities.  Lessen stress.  Sit up straight. Do not tighten (tense) your muscles.  Do not use tobacco products. This includes cigarettes, chewing tobacco, or e-cigarettes. If you need help quitting, ask your doctor.  Exercise regularly as told by your doctor.  Get enough sleep. This often means 7-9 hours of sleep. Contact a doctor if:  Your symptoms are not helped by medicine.  You have a headache that feels different than the other headaches.  You feel sick to your stomach (nauseous) or you throw up (vomit).  You have a fever. Get help right away if:  Your headache becomes really bad.  You keep throwing up.  You have a stiff neck.  You have trouble seeing.  You have  trouble speaking.  You have pain in the eye or ear.  Your muscles are weak or you lose muscle control.  You lose your balance or have trouble walking.  You feel like you will pass out (faint) or you pass out.  You have confusion. This information is not intended to replace advice given to you by your health care provider. Make sure you discuss any questions you have with your health care provider. Document Released: 12/24/2007 Document Revised: 08/22/2015 Document Reviewed: 07/09/2014 Elsevier Interactive Patient Education  2017 [employer]  

## 2016-06-09 NOTE — MAU Note (Addendum)
Pt reports headache for one week no relief w/ oxycodone, Fioricet or tylenol. Pt reports seeing black spots today. @ 4 and blurred vision around 7 pm. Pt states that her abdominal pain started at 4. The pain "feels like gall bladder attack, but no gall bladder is there". Pt also has lower back pain for one week. Pt was discharged from birthing suites this morning

## 2016-06-09 NOTE — Progress Notes (Signed)
Dr. Ezzard StandingNewman notified of pt's lab results, given Dr. Kittie PlaterHorvath's phone number to discuss further plan of care.

## 2016-06-09 NOTE — Progress Notes (Signed)
Dr. Henderson CloudHorvath called and spoke with Azzie RoupKoran, [facility] (charge nurse) and based on Dr. Allene PyoNewman's recommendations orders received to discharge pt home and follow up in office on Thursday, 06/11/16

## 2016-06-09 NOTE — MAU Provider Note (Signed)
Patient Sharon Townsend is a 26 year old G4P0211 at 37 weeks and 0 days here with complaints of headache with blurry vision and spots. She denies bleeding, leaking of fluid. She endorses positive fetal movements. Patient has had multiple MAU visits in the past 6 months for many different complaints. She says that she is very tired of being pregnant and wants the baby out. She is currently using her phone and talking with visitors and in no visible distress.   She was discharged from birthing suites this morning after she found not to meet criteria for pre-eclampsia induction.  Patient  History     CSN: [id number]  Arrival date and time: 06/09/16 2109   None     Chief Complaint  Patient presents with  . Headache  . Abdominal Pain   Headache   This is a chronic problem. The current episode started in the past 7 days. The problem occurs constantly. The problem has been gradually worsening. The pain is located in the frontal region. The pain does not radiate. The pain quality is similar to prior headaches (Patient says taht she has been having a headache for a week). The quality of the pain is described as aching. The pain is at a severity of 9/10. Associated symptoms include blurred vision and a visual change. Pertinent negatives include no abdominal pain, coughing, dizziness, nausea, phonophobia, photophobia, seizures, sinus pressure, sore throat, swollen glands or vomiting. Nothing aggravates the symptoms. She has tried nothing for the symptoms.  She is also complaining of occasional flashes of lights and blurred vision, however patient is texting on her phone during our conversation.   OB History    Gravida Para Term Preterm AB Living   4 2   2 1 1    SAB TAB Ectopic Multiple Live Births   1     0 1      Past Medical History:  Diagnosis Date  . Bipolar disorder (HCC)   . Chronic constipation   . Gall bladder stones   . Headache   . Renal disorder    two tubes in left kidney going  to bladder    Past Surgical History:  Procedure Laterality Date  . CHOLECYSTECTOMY    . MANDIBLE SURGERY    . renal stents    . TONSILLECTOMY      Family History  Problem Relation Age of Onset  . Diabetes Mother   . Diabetes Father   . Hypertension Father     Social History  Substance Use Topics  . Smoking status: Never Smoker  . Smokeless tobacco: Never Used  . Alcohol use No     Comment: before preg    Allergies:  Allergies  Allergen Reactions  . Peanuts [Peanut Oil] Anaphylaxis  . Latex Swelling  . Penicillins Other (See Comments)    Reaction:  Pt states that it "shuts down her bowels and cannot urinate" Has patient had a PCN reaction causing immediate rash, facial/tongue/throat swelling, SOB or lightheadedness with hypotension: No Has patient had a PCN reaction causing severe rash involving mucus membranes or skin necrosis: No Has patient had a PCN reaction that required hospitalization No Has patient had a PCN reaction occurring within the last 10 years: No If all of the above answers are "NO", then may proceed with Cephalosp    Prescriptions Prior to Admission  Medication Sig Dispense Refill Last Dose  . acetaminophen (TYLENOL) 500 MG tablet Take 1,000 mg by mouth every 6 (six) hours as needed  for moderate pain.   06/09/2016 at Unknown time  . butalbital-acetaminophen-caffeine (FIORICET, ESGIC) 50-325-40 MG tablet Take 1-2 tablets by mouth every 6 (six) hours as needed for headache. 20 tablet 0 06/09/2016 at Unknown time  . cyclobenzaprine (FLEXERIL) 10 MG tablet Take 1 tablet (10 mg total) by mouth 2 (two) times daily as needed for muscle spasms. 20 tablet 0 Past Month at Unknown time  . Doxylamine-Pyridoxine (DICLEGIS) 10-10 MG TBEC Take 1 tablet by mouth daily as needed (nausea).   06/09/2016 at Unknown time  . Prenatal Vit-Fe Fumarate-FA (PRENATAL MULTIVITAMIN) TABS tablet Take 1 tablet by mouth daily at 12 noon.   06/09/2016 at Unknown time    Review of Systems   HENT: Negative for sinus pressure and sore throat.   Eyes: Positive for blurred vision. Negative for photophobia.  Respiratory: Negative for cough.   Gastrointestinal: Negative for abdominal pain, nausea and vomiting.  Neurological: Positive for headaches. Negative for dizziness and seizures.   Physical Exam   Blood pressure 116/79, pulse 118, temperature 98.8 F (37.1 C), temperature source Oral, resp. rate 18, last menstrual period 09/24/2015, currently breastfeeding.  Physical Exam  Constitutional: She is oriented to person, place, and time. She appears well-developed.  HENT:  Head: Normocephalic.  Eyes: Pupils are equal, round, and reactive to light.  No eye deviation, pupils are equal and reactive to light. Face is symmetric with normal eye closure and smile.   Neck: Normal range of motion.  Respiratory: Effort normal.  Genitourinary: Vagina normal.  Genitourinary Comments: 2-3 cm, posterior, 50%.   Neurological: She is alert and oriented to person, place, and time. She has normal reflexes. She displays normal reflexes. No cranial nerve deficit. She exhibits normal muscle tone. Coordination normal.  Equal strength in both hands; able to lift both legs and arms against resistance. DTR +1. No pronator drift with outstretched arms.   Skin: Skin is warm and dry.  Psychiatric: She has a normal mood and affect.    MAU Course  Procedures  MDM -NST: 135 with moderate variability, present accelerations and no decelerations; regular mild contractions that the patient reports as uncomfortable but normal for her.   -Neuro exam: no abnormal findings -Headache is improved with stadol injection; patient wants to go home and go to bed. Says that she will feel a lot better if she gets a chance to sleep.  - pre-eclamptic lab results reviewed from today: normal  Assessment and Plan   1. New daily persistent headache   2. False labor before 37 completed weeks of gestation during pregnancy  in third trimester, antepartum    2. Patient stable for discharge with RX for 15 ambien and instructions on when to return to the MAU (bleeding, leaking of fluid, decreased fetal movements.) 3. Plan of care reviewed with Dr. Henderson Cloud who agrees.   Charlesetta Garibaldi Kooistra CNM 06/09/2016, 9:54 PM

## 2016-06-09 NOTE — Consult Note (Signed)
I have reviewed the patient's prenatal records and hospital records prior to my examnination. In summary she is a 25yo P0211 at 37 weeks with a 1 week history of headache. She was seen by Dr. Henderson CloudHorvath in the office with a BP of 140/90 and 1+ proteinuria. Her history is significant for severe preeclampsia in a previous pregnancy Since admission she has had Bps 110s-120s/70s. Headache continues, with no nausea, vomiting, RUQ pain or epigastic pain. Labs show normal LFTs and platlets. A urinary protein/creatinine ratio was done and this was <0.5. On my exam there is no facial or hand edema, trace pedal edema.  DTRs are 1+ biceps, 2+patella, and 0-1 beats clonus at the ankle The patient does not meet criteria for preeclampsia requiring early delivery. This of course may develop over the next several weeks but at this time delivery at <39 weeks is not indicated. Discussed with Dr. Henderson CloudHorvath and the nursing staff  >50% of the 15 minute visit was spent in counseling the patient  Lynford HumphreyMark G. Ezzard StandingNewman MD

## 2016-06-09 NOTE — Anesthesia Pain Management Evaluation Note (Signed)
  [facility] Pain Management Visit Note  Patient: Sharon FriedlanderBrittany R Proano, 26 y.o., female  "Hello [facility]. We have an anesthesia team available at all times to provide care throughout the hospital, including epidural management and anesthesia for C-section. I don't know your plan for the delivery whether it a natural birth, water birth, IV sedation, nitrous supplementation, doula or epidural, but we want to meet your pain goals."   1.Was your pain managed to your expectations on prior hospitalizations?   Yes   2.What is your expectation for pain management during this hospitalization?     Epidural  3.How can we help you reach that goal?   Record the patient's initial score and the patient's pain goal.   Pain: 0  Pain Goal: 8 [facility] wants you to be able to say your pain was always managed very well.  Laban EmperorMalinova,Alexsus Papadopoulos Hristova 06/09/2016

## 2016-06-09 NOTE — Progress Notes (Unsigned)
26 y.o. 3579w0d  [id number] comes into office today still c/o HA.  Last night at MAU she had normal BPS and normal labs.  Today the severe HA is persisting and her BP was 150/90, then 132/84.   She also has 1+ proteinuria.  She had severe preeclampsia and preterm delivery for it in past pregnancy.  She also had hx of 27 week IUFD although that may have been secondary anatomic defect.  Otherwise has good fetal movement and no bleeding.  Past Medical History:  Diagnosis Date  . Bipolar disorder (HCC)   . Chronic constipation   . Gall bladder stones   . Headache   . Renal disorder    two tubes in left kidney going to bladder    Past Surgical History:  Procedure Laterality Date  . CHOLECYSTECTOMY    . MANDIBLE SURGERY    . renal stents    . TONSILLECTOMY      OB History  Gravida Para Term Preterm AB Living  4 2   2 1 1   SAB TAB Ectopic Multiple Live Births  1     0 1    # Outcome Date GA Lbr Len/2nd Weight Sex Delivery Anes PTL Lv  4 Current           3 Preterm 08/03/14 7225w4d 09:30 / 00:21 4 lb 7.1 oz (2.016 kg) M Vag-Spont EPI  LIV     Birth Comments: cleft lip  2 SAB 2011 6351w0d         1 Preterm 2009 4738w0d   F Vag-Spont  N FD      Social History   Social History  . Marital status: Single    Spouse name: N/A  . Number of children: N/A  . Years of education: N/A   Occupational History  . Not on file.   Social History Main Topics  . Smoking status: Never Smoker  . Smokeless tobacco: Never Used  . Alcohol use No     Comment: before preg  . Drug use: No  . Sexual activity: Yes    Birth control/ protection: None     Comment: last sex Mar 9th 2018   Other Topics Concern  . Not on file   Social History Narrative  . No narrative on file   Peanuts [peanut oil]; Latex; and Penicillins    Prenatal Transfer Tool  Maternal Diabetes: No Genetic Screening: Normal Maternal Ultrasounds/Referrals: Normal Fetal Ultrasounds or other Referrals:  None Maternal Substance Abuse:   No Significant Maternal Medications:  None Significant Maternal Lab Results: None  Other PNC: uncomplicated.    There were no vitals filed for this visit.   Lungs/Cor:  NAD Abdomen:  soft, gravid Ex:  no cords, erythema SVE:  3/60/-3 FHTs:  120, good STV, NST R Toco:  q occ   A/P   Term induction for preeclampsia, possible severe secondary HA.  Will reeval labs, BP and HA to decide if needs Magnesium sulfate.  Pitocin induction.    GBS neg.  Sharon Townsend A

## 2016-06-09 NOTE — H&P (Signed)
26 y.o. [redacted]w[redacted]d  G4P0211 comes into office today still c/o HA.  Last night at MAU she had normal BPS and normal labs.  Today the severe HA is persisting and her BP was 150/90, then 132/84.   She also has 1+ proteinuria.  She had severe preeclampsia and preterm delivery for it in past pregnancy.  She also had hx of 27 week IUFD although that may have been secondary anatomic defect.  Otherwise has good fetal movement and no bleeding.  Past Medical History:  Diagnosis Date  . Bipolar disorder (HCC)   . Chronic constipation   . Gall bladder stones   . Headache   . Renal disorder    two tubes in left kidney going to bladder    Past Surgical History:  Procedure Laterality Date  . CHOLECYSTECTOMY    . MANDIBLE SURGERY    . renal stents    . TONSILLECTOMY      OB History  Gravida Para Term Preterm AB Living  4 2   2 1 1  SAB TAB Ectopic Multiple Live Births  1     0 1    # Outcome Date GA Lbr Len/2nd Weight Sex Delivery Anes PTL Lv  4 Current           3 Preterm 08/03/14 [redacted]w[redacted]d 09:30 / 00:21 4 lb 7.1 oz (2.016 kg) M Vag-Spont EPI  LIV     Birth Comments: cleft lip  2 SAB 2011 [redacted]w[redacted]d         1 Preterm 2009 [redacted]w[redacted]d   F Vag-Spont  N FD      Social History   Social History  . Marital status: Single    Spouse name: N/A  . Number of children: N/A  . Years of education: N/A   Occupational History  . Not on file.   Social History Main Topics  . Smoking status: Never Smoker  . Smokeless tobacco: Never Used  . Alcohol use No     Comment: before preg  . Drug use: No  . Sexual activity: Yes    Birth control/ protection: None     Comment: last sex Mar 9th 2018   Other Topics Concern  . Not on file   Social History Narrative  . No narrative on file   Peanuts [peanut oil]; Latex; and Penicillins    Prenatal Transfer Tool  Maternal Diabetes: No Genetic Screening: Normal Maternal Ultrasounds/Referrals: Normal Fetal Ultrasounds or other Referrals:  None Maternal Substance Abuse:   No Significant Maternal Medications:  None Significant Maternal Lab Results: None  Other PNC: uncomplicated.    There were no vitals filed for this visit.   Lungs/Cor:  NAD Abdomen:  soft, gravid Ex:  no cords, erythema SVE:  3/60/-3 FHTs:  120, good STV, NST R Toco:  q occ   A/P   Term induction for preeclampsia, possible severe secondary HA.  Will reeval labs, BP and HA to decide if needs Magnesium sulfate.  Pitocin induction.    GBS neg.  Erling Arrazola A 

## 2016-06-09 NOTE — Discharge Instructions (Signed)
Braxton Hicks Contractions °Contractions of the uterus can occur throughout pregnancy, but they are not always a sign that you are in labor. You may have practice contractions called Braxton Hicks contractions. These false labor contractions are sometimes confused with true labor. °What are Braxton Hicks contractions? °Braxton Hicks contractions are tightening movements that occur in the muscles of the uterus before labor. Unlike true labor contractions, these contractions do not result in opening (dilation) and thinning of the cervix. Toward the end of pregnancy (32-34 weeks), Braxton Hicks contractions can happen more often and may become stronger. These contractions are sometimes difficult to tell apart from true labor because they can be very uncomfortable. You should not feel embarrassed if you go to the hospital with false labor. °Sometimes, the only way to tell if you are in true labor is for your health care provider to look for changes in the cervix. The health care provider will do a physical exam and may monitor your contractions. If you are not in true labor, the exam should show that your cervix is not dilating and your water has not broken. °If there are no prenatal problems or other health problems associated with your pregnancy, it is completely safe for you to be sent home with false labor. You may continue to have Braxton Hicks contractions until you go into true labor. °How can I tell the difference between true labor and false labor? °· Differences °¨ False labor °¨ Contractions last 30-70 seconds.: Contractions are usually shorter and not as strong as true labor contractions. °¨ Contractions become very regular.: Contractions are usually irregular. °¨ Discomfort is usually felt in the top of the uterus, and it spreads to the lower abdomen and low back.: Contractions are often felt in the front of the lower abdomen and in the groin. °¨ Contractions do not go away with walking.: Contractions may  go away when you walk around or change positions while lying down. °¨ Contractions usually become more intense and increase in frequency.: Contractions get weaker and are shorter-lasting as time goes on. °¨ The cervix dilates and gets thinner.: The cervix usually does not dilate or become thin. °Follow these instructions at home: °¨ Take over-the-counter and prescription medicines only as told by your health care provider. °¨ Keep up with your usual exercises and follow other instructions from your health care provider. °¨ Eat and drink lightly if you think you are going into labor. °¨ If Braxton Hicks contractions are making you uncomfortable: °¨ Change your position from lying down or resting to walking, or change from walking to resting. °¨ Sit and rest in a tub of warm water. °¨ Drink enough fluid to keep your urine clear or pale yellow. Dehydration may cause these contractions. °¨ Do slow and deep breathing several times an hour. °¨ Keep all follow-up prenatal visits as told by your health care provider. This is important. °Contact a health care provider if: °¨ You have a fever. °¨ You have continuous pain in your abdomen. °Get help right away if: °¨ Your contractions become stronger, more regular, and closer together. °¨ You have fluid leaking or gushing from your vagina. °¨ You pass blood-tinged mucus (bloody show). °¨ You have bleeding from your vagina. °¨ You have low back pain that you never had before. °¨ You feel your baby’s head pushing down and causing pelvic pressure. °¨ Your baby is not moving inside you as much as it used to. °Summary °¨ Contractions that occur before labor are   called Braxton Hicks contractions, false labor, or practice contractions.  Braxton Hicks contractions are usually shorter, weaker, farther apart, and less regular than true labor contractions. True labor contractions usually become progressively stronger and regular and they become more frequent.  Manage discomfort from  [facility] contractions by changing position, resting in a warm bath, drinking plenty of water, or practicing deep breathing. This information is not intended to replace advice given to you by your health care provider. Make sure you discuss any questions you have with your health care provider. Document Released: 03/16/2005 Document Revised: 02/03/2016 Document Reviewed: 02/03/2016 Elsevier Interactive Patient Education  2017 [employer]  Fetal Movement Counts Patient Name: ________________________________________________ Patient Due Date: ____________________ What is a fetal movement count? A fetal movement count is the number of times that you feel your baby move during a certain amount of time. This may also be called a fetal kick count. A fetal movement count is recommended for every pregnant woman. You may be asked to start counting fetal movements as early as week 28 of your pregnancy. Pay attention to when your baby is most active. You may notice your baby's sleep and wake cycles. You may also notice things that make your baby move more. You should do a fetal movement count:  When your baby is normally most active.  At the same time each day. A good time to count movements is while you are resting, after having something to eat and drink. How do I count fetal movements? 1. Find a quiet, comfortable area. Sit, or lie down on your side. 2. Write down the date, the start time and stop time, and the number of movements that you felt between those two times. Take this information with you to your health care visits. 3. For 2 hours, count kicks, flutters, swishes, rolls, and jabs. You should feel at least 10 movements during 2 hours. 4. You may stop counting after you have felt 10 movements. 5. If you do not feel 10 movements in 2 hours, have something to eat and drink. Then, keep resting and counting for 1 hour. If you feel at least 4 movements during that hour, you may stop  counting. Contact a health care provider if:  You feel fewer than 4 movements in 2 hours.  Your baby is not moving like he or she usually does. Date: ____________ Start time: ____________ Stop time: ____________ Movements: ____________ Date: ____________ Start time: ____________ Stop time: ____________ Movements: ____________ Date: ____________ Start time: ____________ Stop time: ____________ Movements: ____________ Date: ____________ Start time: ____________ Stop time: ____________ Movements: ____________ Date: ____________ Start time: ____________ Stop time: ____________ Movements: ____________ Date: ____________ Start time: ____________ Stop time: ____________ Movements: ____________ Date: ____________ Start time: ____________ Stop time: ____________ Movements: ____________ Date: ____________ Start time: ____________ Stop time: ____________ Movements: ____________ Date: ____________ Start time: ____________ Stop time: ____________ Movements: ____________ This information is not intended to replace advice given to you by your health care provider. Make sure you discuss any questions you have with your health care provider. Document Released: 04/15/2006 Document Revised: 11/13/2015 Document Reviewed: 04/25/2015 Elsevier Interactive Patient Education  2017 [employer] Inc.  Hypertension During Pregnancy Hypertension is also called high blood pressure. High blood pressure means that the force of your blood moving in your body is too strong. When you are pregnant, this condition should be watched carefully. It can cause problems for you and your baby. Follow these instructions at home: Eating and drinking   Drink enough  fluid to keep your pee (urine) clear or pale yellow.  Eat healthy foods that are low in salt (sodium).  Do not add salt to your food.  Check labels on foods and drinks to see much salt is in them. Look on the label where you see "Sodium." Lifestyle   Do not use any  products that contain nicotine or tobacco, such as cigarettes and e-cigarettes. If you need help quitting, ask your doctor.  Do not use alcohol.  Avoid caffeine.  Avoid stress. Rest and get plenty of sleep. General instructions   Take over-the-counter and prescription medicines only as told by your doctor.  While lying down, lie on your left side. This keeps pressure off your baby.  While sitting or lying down, raise (elevate) your feet. Try putting some pillows under your lower legs.  Exercise regularly. Ask your doctor what kinds of exercise are best for you.  Keep all prenatal and follow-up visits as told by your doctor. This is important. Contact a doctor if:  You have symptoms that your doctor told you to watch for, such as:  Fever.  Throwing up (vomiting).  Headache. Get help right away if:  You have very bad pain in your belly (abdomen).  You are throwing up, and this does not get better with treatment.  You suddenly get swelling in your hands, ankles, or face.  You gain 4 lb (1.8 kg) or more in 1 week.  You get bleeding from your vagina.  You have blood in your pee.  You do not feel your baby moving as much as normal.  You have a change in vision.  You have muscle twitching or sudden tightening (spasms).  You have trouble breathing.  Your lips or fingernails turn blue. This information is not intended to replace advice given to you by your health care provider. Make sure you discuss any questions you have with your health care provider. Document Released: 04/18/2010 Document Revised: 11/26/2015 Document Reviewed: 11/26/2015 Elsevier Interactive Patient Education  2017 [employer]

## 2016-06-10 LAB — RPR: RPR: NONREACTIVE

## 2016-06-11 ENCOUNTER — Inpatient Hospital Stay (HOSPITAL_COMMUNITY)
Admission: AD | Admit: 2016-06-11 | Discharge: 2016-06-16 | DRG: 766 | Disposition: A | Discharge status: Home / Self Care | Payer: Medicaid Other | Source: Ambulatory Visit | Attending: Obstetrics and Gynecology | Admitting: Obstetrics and Gynecology

## 2016-06-11 ENCOUNTER — Encounter (HOSPITAL_COMMUNITY): Payer: Self-pay

## 2016-06-11 DIAGNOSIS — Z302 Encounter for sterilization: Secondary | ICD-10-CM

## 2016-06-11 DIAGNOSIS — D649 Anemia, unspecified: Secondary | ICD-10-CM | POA: Diagnosis not present

## 2016-06-11 DIAGNOSIS — O9081 Anemia of the puerperium: Secondary | ICD-10-CM | POA: Diagnosis not present

## 2016-06-11 DIAGNOSIS — Z833 Family history of diabetes mellitus: Secondary | ICD-10-CM | POA: Diagnosis not present

## 2016-06-11 DIAGNOSIS — O134 Gestational [pregnancy-induced] hypertension without significant proteinuria, complicating childbirth: Secondary | ICD-10-CM | POA: Diagnosis present

## 2016-06-11 DIAGNOSIS — O133 Gestational [pregnancy-induced] hypertension without significant proteinuria, third trimester: Secondary | ICD-10-CM | POA: Diagnosis present

## 2016-06-11 DIAGNOSIS — Z8249 Family history of ischemic heart disease and other diseases of the circulatory system: Secondary | ICD-10-CM | POA: Diagnosis not present

## 2016-06-11 DIAGNOSIS — Z3A37 37 weeks gestation of pregnancy: Secondary | ICD-10-CM

## 2016-06-11 DIAGNOSIS — O4703 False labor before 37 completed weeks of gestation, third trimester: Secondary | ICD-10-CM

## 2016-06-11 DIAGNOSIS — R030 Elevated blood-pressure reading, without diagnosis of hypertension: Secondary | ICD-10-CM | POA: Diagnosis present

## 2016-06-11 LAB — URINALYSIS, ROUTINE W REFLEX MICROSCOPIC
Bilirubin Urine: NEGATIVE
Glucose, UA: 50 mg/dL — AB
HGB URINE DIPSTICK: NEGATIVE
Ketones, ur: 5 mg/dL — AB
LEUKOCYTES UA: NEGATIVE
Nitrite: NEGATIVE
PROTEIN: NEGATIVE mg/dL
SPECIFIC GRAVITY, URINE: 1.01 (ref 1.005–1.030)
pH: 6 (ref 5.0–8.0)

## 2016-06-11 LAB — COMPREHENSIVE METABOLIC PANEL
ALBUMIN: 3 g/dL — AB (ref 3.5–5.0)
ALK PHOS: 157 U/L — AB (ref 38–126)
ALT: 12 U/L — AB (ref 14–54)
AST: 20 U/L (ref 15–41)
Anion gap: 10 (ref 5–15)
BILIRUBIN TOTAL: 0.5 mg/dL (ref 0.3–1.2)
BUN: 7 mg/dL (ref 6–20)
CO2: 20 mmol/L — ABNORMAL LOW (ref 22–32)
CREATININE: 0.68 mg/dL (ref 0.44–1.00)
Calcium: 8.8 mg/dL — ABNORMAL LOW (ref 8.9–10.3)
Chloride: 103 mmol/L (ref 101–111)
GFR calc Af Amer: >60 mL/min (ref >60)
Glucose, Bld: 88 mg/dL (ref 65–99)
POTASSIUM: 3.8 mmol/L (ref 3.5–5.1)
Sodium: 133 mmol/L — ABNORMAL LOW (ref 135–145)
Total Protein: 6.4 g/dL — ABNORMAL LOW (ref 6.5–8.1)

## 2016-06-11 LAB — PROTEIN / CREATININE RATIO, URINE
Creatinine, Urine: 94 mg/dL
Protein Creatinine Ratio: 0.21 mg/mg{Cre} — ABNORMAL HIGH (ref 0.00–0.15)
Total Protein, Urine: 20 mg/dL

## 2016-06-11 LAB — TYPE AND SCREEN
ABO/RH(D): O NEG
ANTIBODY SCREEN: POSITIVE
DAT, IgG: NEGATIVE
UNIT DIVISION: 0
UNIT DIVISION: 0

## 2016-06-11 LAB — CBC
HEMATOCRIT: 38.8 % (ref 36.0–46.0)
HEMOGLOBIN: 11.9 g/dL — AB (ref 12.0–15.0)
MCH: 22.7 pg — ABNORMAL LOW (ref 26.0–34.0)
MCHC: 30.7 g/dL (ref 30.0–36.0)
MCV: 74 fL — AB (ref 78.0–100.0)
Platelets: 120 10*3/uL — ABNORMAL LOW (ref 150–400)
RBC: 5.24 MIL/uL — AB (ref 3.87–5.11)
RDW: 16 % — ABNORMAL HIGH (ref 11.5–15.5)
WBC: 5.9 10*3/uL (ref 4.0–10.5)

## 2016-06-11 LAB — LACTATE DEHYDROGENASE: LDH: 170 U/L (ref 98–192)

## 2016-06-11 LAB — URIC ACID: Uric Acid, Serum: 6.3 mg/dL (ref 2.3–6.6)

## 2016-06-11 LAB — BPAM RBC
Blood Product Expiration Date: 201803192359
Blood Product Expiration Date: 201803212359
Unit Type and Rh: 9500
Unit Type and Rh: 9500

## 2016-06-11 MED ORDER — OXYCODONE-ACETAMINOPHEN 5-325 MG PO TABS
1.0000 | ORAL_TABLET | ORAL | Status: DC | PRN
Start: 2016-06-11 — End: 2016-06-14

## 2016-06-11 MED ORDER — ONDANSETRON HCL 4 MG/2ML IJ SOLN: 4.0000 mg | Freq: Four times a day (QID) | INTRAMUSCULAR | Status: DC | PRN

## 2016-06-11 MED ORDER — LIDOCAINE HCL (PF) 1 % IJ SOLN
30.0000 mL | INTRAMUSCULAR | Status: DC | PRN
  Filled 2016-06-11: qty 30

## 2016-06-11 MED ORDER — OXYCODONE-ACETAMINOPHEN 5-325 MG PO TABS
2.0000 | ORAL_TABLET | ORAL | Status: DC | PRN
  Administered 2016-06-12 – 2016-06-13 (×2): 2 via ORAL
  Filled 2016-06-11 (×2): qty 2

## 2016-06-11 MED ORDER — BUTORPHANOL TARTRATE 1 MG/ML IJ SOLN: 1.0000 mg | INTRAMUSCULAR | Status: DC | PRN

## 2016-06-11 MED ORDER — OXYTOCIN 40 UNITS IN LACTATED RINGERS INFUSION - SIMPLE MED
1.0000 m[IU]/min | INTRAVENOUS | Status: DC
  Administered 2016-06-11 – 2016-06-13 (×2): 2 m[IU]/min via INTRAVENOUS
  Filled 2016-06-11 (×2): qty 1000

## 2016-06-11 MED ORDER — TERBUTALINE SULFATE 1 MG/ML IJ SOLN: 0.2500 mg | Freq: Once | INTRAMUSCULAR | Status: DC | PRN

## 2016-06-11 MED ORDER — OXYTOCIN BOLUS FROM INFUSION: 500.0000 mL | Freq: Once | INTRAVENOUS | Status: DC

## 2016-06-11 MED ORDER — SOD CITRATE-CITRIC ACID 500-334 MG/5ML PO SOLN
30.0000 mL | ORAL | Status: DC | PRN
  Administered 2016-06-14: 30 mL via ORAL
  Filled 2016-06-11: qty 15

## 2016-06-11 MED ORDER — LACTATED RINGERS IV SOLN
INTRAVENOUS | Status: DC
  Administered 2016-06-11 – 2016-06-12 (×3): via INTRAVENOUS

## 2016-06-11 MED ORDER — ACETAMINOPHEN 325 MG PO TABS
650.0000 mg | ORAL_TABLET | ORAL | Status: DC | PRN
  Administered 2016-06-11 – 2016-06-13 (×3): 650 mg via ORAL
  Filled 2016-06-11 (×3): qty 2

## 2016-06-11 MED ORDER — LACTATED RINGERS IV SOLN: 500.0000 mL | INTRAVENOUS | Status: DC | PRN

## 2016-06-11 MED ORDER — OXYTOCIN 40 UNITS IN LACTATED RINGERS INFUSION - SIMPLE MED: 2.5000 [IU]/h | INTRAVENOUS | Status: DC

## 2016-06-11 NOTE — MAU Note (Signed)
Pt sent from office for HTN

## 2016-06-11 NOTE — MAU Provider Note (Signed)
History     CSN: [id number]  Arrival date and time: 06/11/16 1640   First Provider Initiated Contact with Patient 06/11/16 1737      Chief Complaint  Patient presents with  . Hypertension   HPI   Sharon Townsend is 26 y.o. female [phone] @ 9w2dhere in MAU due to elevated BP readings. She was in the office today and had a BP of 150/90. States she has had a HA for 1.5 weeks. She took tylenol and Fioricet without relief. She was also seen in the office on 3/13 and was noted to have elevated BP readings. Patient had preeclampsia with previous pregnancy and baby was delivered preterm.  She also complains of decreased fetal movement.   OB History    Gravida Para Term Preterm AB Living   4 2   2 1 1    SAB TAB Ectopic Multiple Live Births   1     0 1      Past Medical History:  Diagnosis Date  . Bipolar disorder ([place]   . Chronic constipation   . Gall bladder stones   . Headache   . Renal disorder    two tubes in left kidney going to bladder    Past Surgical History:  Procedure Laterality Date  . CHOLECYSTECTOMY    . MANDIBLE SURGERY    . renal stents    . TONSILLECTOMY      Family History  Problem Relation Age of Onset  . Diabetes Mother   . Diabetes Father   . Hypertension Father     Social History  Substance Use Topics  . Smoking status: Never Smoker  . Smokeless tobacco: Never Used  . Alcohol use No     Comment: before preg    Allergies:  Allergies  Allergen Reactions  . Peanuts [Peanut Oil] Anaphylaxis  . Latex Swelling  . Penicillins Other (See Comments)    Reaction:  Pt states that it "shuts down her bowels and cannot urinate" Has patient had a PCN reaction causing immediate rash, facial/tongue/throat swelling, SOB or lightheadedness with hypotension: No Has patient had a PCN reaction causing severe rash involving mucus membranes or skin necrosis: No Has patient had a PCN reaction that required hospitalization No Has patient had a PCN  reaction occurring within the last 10 years: No If all of the above answers are "NO", then may proceed with Cephalosp    Prescriptions Prior to Admission  Medication Sig Dispense Refill Last Dose  . acetaminophen (TYLENOL) 500 MG tablet Take 1,000 mg by mouth every 6 (six) hours as needed for moderate pain.   06/09/2016 at Unknown time  . butalbital-acetaminophen-caffeine (FIORICET, ESGIC) 50-325-40 MG tablet Take 1-2 tablets by mouth every 6 (six) hours as needed for headache. 20 tablet 0 06/09/2016 at Unknown time  . cyclobenzaprine (FLEXERIL) 10 MG tablet Take 1 tablet (10 mg total) by mouth 2 (two) times daily as needed for muscle spasms. 20 tablet 0 Past Month at Unknown time  . Doxylamine-Pyridoxine (DICLEGIS) 10-10 MG TBEC Take 1 tablet by mouth daily as needed (nausea).   06/09/2016 at Unknown time  . Prenatal Vit-Fe Fumarate-FA (PRENATAL MULTIVITAMIN) TABS tablet Take 1 tablet by mouth daily at 12 noon.   06/09/2016 at Unknown time  . zolpidem (AMBIEN CR) 12.5 MG CR tablet Take 1 tablet (12.5 mg total) by mouth at bedtime as needed for sleep. 15 tablet 0    Results for orders placed or performed during the hospital encounter of  06/11/16 (from the past 48 hour(s))  Urinalysis, Routine w reflex microscopic     Status: Abnormal   Collection Time: 06/11/16  4:58 PM  Result Value Ref Range   Color, Urine YELLOW YELLOW   APPearance HAZY (A) CLEAR   Specific Gravity, Urine 1.010 1.005 - 1.030   pH 6.0 5.0 - 8.0   Glucose, UA 50 (A) NEGATIVE mg/dL   Hgb urine dipstick NEGATIVE NEGATIVE   Bilirubin Urine NEGATIVE NEGATIVE   Ketones, ur 5 (A) NEGATIVE mg/dL   Protein, ur NEGATIVE NEGATIVE mg/dL   Nitrite NEGATIVE NEGATIVE   Leukocytes, UA NEGATIVE NEGATIVE  Protein / creatinine ratio, urine     Status: Abnormal   Collection Time: 06/11/16  4:58 PM  Result Value Ref Range   Creatinine, Urine 94.00 mg/dL   Total Protein, Urine 20 mg/dL    Comment: NO NORMAL RANGE ESTABLISHED FOR THIS TEST    Protein Creatinine Ratio 0.21 (H) 0.00 - 0.15 mg/mg[Cre]  CBC     Status: Abnormal   Collection Time: 06/11/16  5:22 PM  Result Value Ref Range   WBC 5.9 4.0 - 10.5 K/uL   RBC 5.24 (H) 3.87 - 5.11 MIL/uL   Hemoglobin 11.9 (L) 12.0 - 15.0 g/dL   HCT 38.8 36.0 - 46.0 %   MCV 74.0 (L) 78.0 - 100.0 fL   MCH 22.7 (L) 26.0 - 34.0 pg   MCHC 30.7 30.0 - 36.0 g/dL   RDW 16.0 (H) 11.5 - 15.5 %   Platelets 120 (L) 150 - 400 K/uL  Comprehensive metabolic panel     Status: Abnormal   Collection Time: 06/11/16  5:22 PM  Result Value Ref Range   Sodium 133 (L) 135 - 145 mmol/L   Potassium 3.8 3.5 - 5.1 mmol/L   Chloride 103 101 - 111 mmol/L   CO2 20 (L) 22 - 32 mmol/L   Glucose, Bld 88 65 - 99 mg/dL   BUN 7 6 - 20 mg/dL   Creatinine, Ser 0.68 0.44 - 1.00 mg/dL   Calcium 8.8 (L) 8.9 - 10.3 mg/dL   Total Protein 6.4 (L) 6.5 - 8.1 g/dL   Albumin 3.0 (L) 3.5 - 5.0 g/dL   AST 20 15 - 41 U/L   ALT 12 (L) 14 - 54 U/L   Alkaline Phosphatase 157 (H) 38 - 126 U/L   Total Bilirubin 0.5 0.3 - 1.2 mg/dL   GFR calc non Af Amer >60 >60 mL/min   GFR calc Af Amer >60 >60 mL/min    Comment: (NOTE) The eGFR has been calculated using the CKD EPI equation. This calculation has not been validated in all clinical situations. eGFR's persistently <60 mL/min signify possible Chronic Kidney Disease.    Anion gap 10 5 - 15  Lactate dehydrogenase     Status: None   Collection Time: 06/11/16  5:22 PM  Result Value Ref Range   LDH 170 98 - 192 U/L  Uric acid     Status: None   Collection Time: 06/11/16  5:22 PM  Result Value Ref Range   Uric Acid, Serum 6.3 2.3 - 6.6 mg/dL  Type and screen [place]     Status: None   Collection Time: 06/11/16  5:22 PM  Result Value Ref Range   ABO/RH(D) O NEG    Antibody Screen POS    Sample Expiration 06/14/2016    DAT, IgG NEG    Review of Systems  Gastrointestinal: Positive for abdominal pain.  Neurological: Positive for headaches.   Physical  Exam   Blood pressure (!) 130/101, pulse 91, resp. rate 18, last menstrual period 09/24/2015, currently breastfeeding.   Patient Vitals for the past 24 hrs:  BP Temp Temp src Pulse Resp Height Weight  06/11/16 1912 - - - - - 5' (1.524 m) 189 lb (85.7 kg)  06/11/16 1905 129/84 98.4 F (36.9 C) Oral 98 18 - -  06/11/16 1832 115/75 - - 90 - - -  06/11/16 1816 127/81 - - 88 - - -  06/11/16 1801 129/77 - - 90 - - -  06/11/16 1746 128/75 - - 88 - - -  06/11/16 1731 129/71 - - 84 - - -  06/11/16 1716 124/85 - - 88 - - -  06/11/16 1704 (!) 130/101 - - 91 - - -  06/11/16 1654 - - - - 18 - -  06/11/16 1653 123/75 - - 96 - - -    Physical Exam  Constitutional: She is oriented to person, place, and time. She appears well-developed and well-nourished. No distress.  HENT:  Head: Normocephalic.  Eyes: Pupils are equal, round, and reactive to light.  GI: Soft. She exhibits no distension. There is no tenderness. There is no rebound.  Musculoskeletal: Normal range of motion.  Neurological: She is alert and oriented to person, place, and time. She has normal reflexes. She displays normal reflexes.  Negative clonus   Skin: Skin is warm. She is not diaphoretic.  Psychiatric: Her behavior is normal.   Fetal Tracing: Baseline:130 bpm Variability: moderate  Accelerations: 15x15 Decelerations: None Toco: Irregular pattern.   MAU Course  Procedures  MDM  PIH labs PCR Dr. Harrington Challenger called to MAU stating she was admitting the patient to labor and delivery.   Assessment and Plan    Lezlie Lye, NP 06/11/2016 8:03 PM]

## 2016-06-11 NOTE — H&P (Signed)
Sharon FriedlanderBrittany R Schmoll is a 26 y.o. female presenting for elevated BPs  26 yo [id number] @ 37+2 presents to MAU from the office for evaluation of elevated BPs. The patient was seen in the office on 3/13 and was noted to have BPs 150/90 and 138/88. She also complained of headaches and visual scotoma. [facility]. Labs were normal and all BPs were normotensive and the Pt was discharged home. In the office again today her BPs were 150/90 and 140/88. She continues to c/o headaches and vision changes. Labs were normal and the majority of her BPs wer 120s/70-80s. She did have 131/100. The patient's case was d/w Dr. Clarisa FlingBrost from MFM and given that she has 2 documented BPs >140/90 at least 4 hours apart and persistent vision changes and headaches she meets criteria for gestational hypertension with severe features. Will proceed with IOL OB History    Gravida Para Term Preterm AB Living   4 2   2 1 1    SAB TAB Ectopic Multiple Live Births   1     0 1     Past Medical History:  Diagnosis Date  . Bipolar disorder (HCC)   . Chronic constipation   . Gall bladder stones   . Headache   . Renal disorder    two tubes in left kidney going to bladder   Past Surgical History:  Procedure Laterality Date  . CHOLECYSTECTOMY    . MANDIBLE SURGERY    . renal stents    . TONSILLECTOMY     Family History: family history includes Diabetes in her father and mother; Hypertension in her father. Social History:  reports that she has never smoked. She has never used smokeless tobacco. She reports that she does not drink alcohol or use drugs.     Maternal Diabetes: No Genetic Screening: Normal Maternal Ultrasounds/Referrals: Normal Fetal Ultrasounds or other Referrals:  None Maternal Substance Abuse:  No Significant Maternal Medications:  None Significant Maternal Lab Results:  None Other Comments:  None  ROS as above History   Blood pressure 129/77, pulse 90, resp. rate 18, last menstrual  period 09/24/2015, currently breastfeeding. Exam Physical Exam  Prenatal labs: ABO, Rh: --/--/O NEG (03/13 1157) Antibody: POS (03/13 1157) Rubella: Immune (08/15 0000) RPR: Non Reactive (03/13 1157)  HBsAg: Negative (08/15 0000)  HIV: Non-reactive (08/15 0000)  GBS: Negative (02/27 0000)   Assessment/Plan: 1) Admit 2) Epidural on request 3) Pitocin augmentation 4) Labetalol per protocol for severe range BPs   Sharon Luba H. 06/11/2016, 6:20 PM

## 2016-06-11 NOTE — MAU Note (Signed)
Pt unable to urinate, gave water and will try to get sample.

## 2016-06-11 NOTE — MAU Note (Signed)
Attempt at IV in Left AC when drawing labs, but IV would not advance. Was able to draw labs and are processing.

## 2016-06-12 ENCOUNTER — Encounter (HOSPITAL_COMMUNITY): Payer: Self-pay | Admitting: *Deleted

## 2016-06-12 LAB — RPR: RPR: NONREACTIVE

## 2016-06-12 MED ORDER — MISOPROSTOL 25 MCG QUARTER TABLET
25.0000 ug | ORAL_TABLET | ORAL | Status: DC
  Administered 2016-06-12 – 2016-06-13 (×5): 25 ug via VAGINAL
  Filled 2016-06-12 (×3): qty 1
  Filled 2016-06-12 (×3): qty 0.25
  Filled 2016-06-12 (×2): qty 1
  Filled 2016-06-12: qty 0.25
  Filled 2016-06-12: qty 1
  Filled 2016-06-12 (×2): qty 0.25

## 2016-06-12 NOTE — Progress Notes (Signed)
Pt has been comfortable without complaints throughout the day. Pitocin has been increased 2x2 up to 32 milli-units/min with minimal change to cervix. FHT: 120 mod var +accels TOCO: q2-3 SVE: 1/50/-3 A/P: induction with minimal change Will switch to cytotec overnight and consider resuming pitocin in am and possible foley bulb.

## 2016-06-12 NOTE — Anesthesia Pain Management Evaluation Note (Signed)
  [facility] Pain Management Visit Note  Patient: Sharon Townsend, 26 y.o., female  "Hello I am a [facility] the anesthesia [facility]. We have an anesthesia team available at all times to provide care throughout the hospital, including epidural management and anesthesia for C-section. I don't know your plan for the delivery whether it a natural birth, water birth, IV sedation, nitrous supplementation, doula or epidural, but we want to meet your pain goals."   1.Was your pain managed to your expectations on prior hospitalizations?   Yes   2.What is your expectation for pain management during this hospitalization?     Epidural  3.How can we help you reach that goal? epidural  Record the patient's initial score and the patient's pain goal.   Pain: 6  Pain Goal: 3 [facility] wants you to be able to say your pain was always managed very well.  Sharon Townsend 06/12/2016

## 2016-06-13 ENCOUNTER — Inpatient Hospital Stay (HOSPITAL_COMMUNITY): Payer: Medicaid Other | Admitting: Anesthesiology

## 2016-06-13 LAB — CBC
HCT: 28.7 % — ABNORMAL LOW (ref 36.0–46.0)
Hemoglobin: 9.1 g/dL — ABNORMAL LOW (ref 12.0–15.0)
MCH: 23.3 pg — AB (ref 26.0–34.0)
MCHC: 31.7 g/dL (ref 30.0–36.0)
MCV: 73.6 fL — ABNORMAL LOW (ref 78.0–100.0)
PLATELETS: 136 10*3/uL — AB (ref 150–400)
RBC: 3.9 MIL/uL (ref 3.87–5.11)
RDW: 16.1 % — ABNORMAL HIGH (ref 11.5–15.5)
WBC: 6.7 10*3/uL (ref 4.0–10.5)

## 2016-06-13 MED ORDER — FENTANYL 2.5 MCG/ML BUPIVACAINE 1/10 % EPIDURAL INFUSION (WH - ANES)
INTRAMUSCULAR | Status: AC
  Filled 2016-06-13: qty 100

## 2016-06-13 MED ORDER — FENTANYL 2.5 MCG/ML BUPIVACAINE 1/10 % EPIDURAL INFUSION (WH - ANES): 14.0000 mL/h | INTRAMUSCULAR | Status: DC | PRN

## 2016-06-13 MED ORDER — PHENYLEPHRINE 40 MCG/ML (10ML) SYRINGE FOR IV PUSH (FOR BLOOD PRESSURE SUPPORT)
80.0000 ug | PREFILLED_SYRINGE | INTRAVENOUS | Status: DC | PRN
Start: 2016-06-13 — End: 2016-06-13

## 2016-06-13 MED ORDER — DIPHENHYDRAMINE HCL 50 MG/ML IJ SOLN: 12.5000 mg | INTRAMUSCULAR | Status: DC | PRN

## 2016-06-13 MED ORDER — LACTATED RINGERS IV SOLN
500.0000 mL | Freq: Once | INTRAVENOUS | Status: AC
  Administered 2016-06-13: 500 mL via INTRAVENOUS

## 2016-06-13 MED ORDER — FENTANYL 2.5 MCG/ML BUPIVACAINE 1/10 % EPIDURAL INFUSION (WH - ANES)
14.0000 mL/h | INTRAMUSCULAR | Status: DC | PRN
  Administered 2016-06-13: 14 mL/h via EPIDURAL
  Filled 2016-06-13: qty 100

## 2016-06-13 MED ORDER — PHENYLEPHRINE 40 MCG/ML (10ML) SYRINGE FOR IV PUSH (FOR BLOOD PRESSURE SUPPORT): 80.0000 ug | PREFILLED_SYRINGE | INTRAVENOUS | Status: DC | PRN

## 2016-06-13 MED ORDER — PHENYLEPHRINE 40 MCG/ML (10ML) SYRINGE FOR IV PUSH (FOR BLOOD PRESSURE SUPPORT)
80.0000 ug | PREFILLED_SYRINGE | INTRAVENOUS | Status: DC | PRN
  Filled 2016-06-13: qty 10

## 2016-06-13 MED ORDER — EPHEDRINE 5 MG/ML INJ: 10.0000 mg | INTRAVENOUS | Status: DC | PRN

## 2016-06-13 MED ORDER — PHENYLEPHRINE 40 MCG/ML (10ML) SYRINGE FOR IV PUSH (FOR BLOOD PRESSURE SUPPORT)
80.0000 ug | PREFILLED_SYRINGE | INTRAVENOUS | Status: DC | PRN
  Administered 2016-06-14: 80 ug via INTRAVENOUS

## 2016-06-13 MED ORDER — PHENYLEPHRINE 40 MCG/ML (10ML) SYRINGE FOR IV PUSH (FOR BLOOD PRESSURE SUPPORT)
PREFILLED_SYRINGE | INTRAVENOUS | Status: DC
Start: 2016-06-13 — End: 2016-06-14
  Filled 2016-06-13: qty 10

## 2016-06-13 MED ORDER — LIDOCAINE HCL (PF) 1 % IJ SOLN
INTRAMUSCULAR | Status: DC | PRN
  Administered 2016-06-13: 13 mL via EPIDURAL

## 2016-06-13 MED ORDER — LACTATED RINGERS IV SOLN: 500.0000 mL | Freq: Once | INTRAVENOUS | Status: DC

## 2016-06-13 NOTE — Anesthesia Preprocedure Evaluation (Signed)
Anesthesia Evaluation  Patient identified by MRN, date of birth, ID band Patient awake    Reviewed: Allergy & Precautions, H&P , NPO status , Patient's Chart, lab work & pertinent test results  Airway Mallampati: II  TM Distance: >3 FB Neck ROM: full    Dental no notable dental hx.    Pulmonary neg pulmonary ROS,    Pulmonary exam normal        Cardiovascular hypertension, negative cardio ROS Normal cardiovascular exam     Neuro/Psych Bipolar Disorder negative neurological ROS     GI/Hepatic negative GI ROS, Neg liver ROS,   Endo/Other  negative endocrine ROS  Renal/GU      Musculoskeletal   Abdominal (+) + obese,   Peds  Hematology negative hematology ROS (+)   Anesthesia Other Findings   Reproductive/Obstetrics (+) Pregnancy                             Anesthesia Physical  Anesthesia Plan  ASA: II  Anesthesia Plan: Epidural   Post-op Pain Management:    Induction:   Airway Management Planned:   Additional Equipment:   Intra-op Plan:   Post-operative Plan:   Informed Consent: I have reviewed the patients History and Physical, chart, labs and discussed the procedure including the risks, benefits and alternatives for the proposed anesthesia with the patient or authorized representative who has indicated his/her understanding and acceptance.     Plan Discussed with:   Anesthesia Plan Comments:         Anesthesia Quick Evaluation

## 2016-06-13 NOTE — Anesthesia Procedure Notes (Signed)
Epidural Patient location during procedure: OB Start time: 06/13/2016 8:47 PM End time: 06/13/2016 9:05 PM  Staffing Anesthesiologist: Anitra LauthMILLER, Keelan Pomerleau RAY Performed: anesthesiologist   Preanesthetic Checklist Completed: patient identified, site marked, surgical consent, pre-op evaluation, timeout performed, IV checked, risks and benefits discussed and monitors and equipment checked  Epidural Patient position: sitting Prep: DuraPrep Patient monitoring: heart rate, cardiac monitor, continuous pulse ox and blood pressure Approach: midline Location: L2-L3 Injection technique: LOR saline  Needle:  Needle type: Tuohy  Needle gauge: 17 G Needle length: 9 cm Needle insertion depth: 5 cm Catheter type: closed end flexible Catheter size: 20 Guage Catheter at skin depth: 8 cm Test dose: negative  Assessment Events: blood not aspirated, injection not painful, no injection resistance, negative IV test and no paresthesia  Additional Notes Reason for block:procedure for pain

## 2016-06-13 NOTE — Progress Notes (Signed)
Pt comfortable, feeling some contractions, not too painful. Stopped pitocin last night and switched to overnight cytotec. FHT: 125 +accels no decels  TOCO q3-6 SVE: 1/thick/-3 A/P: IOL for GHTN Received 2x2 pit for approx 24 hrs with little change, unable to place foley bulb Switched to cytotec last night, due for 3rd dose Cont. Current mgmt, potentially a 4th cytotec and then consider pitocin with foley bulb again. Head ballotable, AROM not possible

## 2016-06-13 NOTE — Progress Notes (Signed)
cytotec placed. 

## 2016-06-14 ENCOUNTER — Encounter (HOSPITAL_COMMUNITY)
Admission: AD | Disposition: A | Discharge status: Home / Self Care | Payer: Self-pay | Source: Ambulatory Visit | Attending: Obstetrics and Gynecology

## 2016-06-14 ENCOUNTER — Encounter (HOSPITAL_COMMUNITY): Payer: Self-pay | Admitting: *Deleted

## 2016-06-14 HISTORY — PX: TUBAL LIGATION: SHX77

## 2016-06-14 SURGERY — Surgical Case
Anesthesia: Epidural

## 2016-06-14 MED ORDER — PRENATAL MULTIVITAMIN CH
1.0000 | ORAL_TABLET | Freq: Every day | ORAL | Status: DC
  Administered 2016-06-15 – 2016-06-16 (×2): 1 via ORAL
  Filled 2016-06-14 (×2): qty 1

## 2016-06-14 MED ORDER — OXYTOCIN 10 UNIT/ML IJ SOLN
INTRAMUSCULAR | Status: AC
  Filled 2016-06-14: qty 4

## 2016-06-14 MED ORDER — FENTANYL CITRATE (PF) 100 MCG/2ML IJ SOLN
INTRAMUSCULAR | Status: DC | PRN
  Administered 2016-06-14: 75 ug via INTRAVENOUS
  Administered 2016-06-14 (×2): 50 ug via INTRAVENOUS

## 2016-06-14 MED ORDER — FENTANYL CITRATE (PF) 100 MCG/2ML IJ SOLN
INTRAMUSCULAR | Status: AC
  Filled 2016-06-14: qty 2

## 2016-06-14 MED ORDER — WITCH HAZEL-GLYCERIN EX PADS: 1.0000 "application " | MEDICATED_PAD | CUTANEOUS | Status: DC | PRN

## 2016-06-14 MED ORDER — ACETAMINOPHEN 325 MG PO TABS
650.0000 mg | ORAL_TABLET | ORAL | Status: DC | PRN
  Administered 2016-06-15: 650 mg via ORAL
  Filled 2016-06-14: qty 2

## 2016-06-14 MED ORDER — IBUPROFEN 600 MG PO TABS
600.0000 mg | ORAL_TABLET | Freq: Four times a day (QID) | ORAL | Status: DC
  Administered 2016-06-14 – 2016-06-16 (×7): 600 mg via ORAL
  Filled 2016-06-14 (×7): qty 1

## 2016-06-14 MED ORDER — LIDOCAINE-EPINEPHRINE (PF) 2 %-1:200000 IJ SOLN
INTRAMUSCULAR | Status: DC | PRN
  Administered 2016-06-14: 6 mL via EPIDURAL
  Administered 2016-06-14: 10 mL via EPIDURAL
  Administered 2016-06-14 (×2): 5 mL via EPIDURAL

## 2016-06-14 MED ORDER — ONDANSETRON HCL 4 MG/2ML IJ SOLN
INTRAMUSCULAR | Status: AC
  Filled 2016-06-14: qty 2

## 2016-06-14 MED ORDER — NALBUPHINE HCL 10 MG/ML IJ SOLN
2.5000 mg | INTRAMUSCULAR | Status: DC | PRN
  Administered 2016-06-14 (×3): 2.5 mg via INTRAVENOUS
  Filled 2016-06-14 (×2): qty 1

## 2016-06-14 MED ORDER — ZOLPIDEM TARTRATE 5 MG PO TABS: 5.0000 mg | ORAL_TABLET | Freq: Every evening | ORAL | Status: DC | PRN

## 2016-06-14 MED ORDER — OXYCODONE-ACETAMINOPHEN 5-325 MG PO TABS
2.0000 | ORAL_TABLET | ORAL | Status: DC | PRN
  Administered 2016-06-15 – 2016-06-16 (×3): 2 via ORAL
  Filled 2016-06-14 (×3): qty 2

## 2016-06-14 MED ORDER — ONDANSETRON HCL 4 MG/2ML IJ SOLN
INTRAMUSCULAR | Status: DC | PRN
  Administered 2016-06-14: 4 mg via INTRAVENOUS

## 2016-06-14 MED ORDER — COCONUT OIL OIL: 1.0000 "application " | TOPICAL_OIL | Status: DC | PRN

## 2016-06-14 MED ORDER — SIMETHICONE 80 MG PO CHEW
80.0000 mg | CHEWABLE_TABLET | ORAL | Status: DC | PRN
Start: 2016-06-14 — End: 2016-06-16
  Administered 2016-06-15: 80 mg via ORAL

## 2016-06-14 MED ORDER — LACTATED RINGERS IV SOLN
INTRAVENOUS | Status: DC
  Administered 2016-06-14: 17:00:00 via INTRAVENOUS

## 2016-06-14 MED ORDER — DEXAMETHASONE SODIUM PHOSPHATE 4 MG/ML IJ SOLN
INTRAMUSCULAR | Status: DC | PRN
  Administered 2016-06-14: 4 mg via INTRAVENOUS

## 2016-06-14 MED ORDER — OXYTOCIN 40 UNITS IN LACTATED RINGERS INFUSION - SIMPLE MED: 2.5000 [IU]/h | INTRAVENOUS | Status: AC

## 2016-06-14 MED ORDER — SIMETHICONE 80 MG PO CHEW
80.0000 mg | CHEWABLE_TABLET | Freq: Three times a day (TID) | ORAL | Status: DC
  Administered 2016-06-15 – 2016-06-16 (×4): 80 mg via ORAL
  Filled 2016-06-14 (×4): qty 1

## 2016-06-14 MED ORDER — DIPHENHYDRAMINE HCL 25 MG PO CAPS
25.0000 mg | ORAL_CAPSULE | Freq: Four times a day (QID) | ORAL | Status: DC | PRN
  Administered 2016-06-14 – 2016-06-15 (×2): 25 mg via ORAL
  Filled 2016-06-14 (×2): qty 1

## 2016-06-14 MED ORDER — MEPERIDINE HCL 25 MG/ML IJ SOLN: 6.2500 mg | INTRAMUSCULAR | Status: DC | PRN

## 2016-06-14 MED ORDER — SIMETHICONE 80 MG PO CHEW
80.0000 mg | CHEWABLE_TABLET | ORAL | Status: DC
  Administered 2016-06-14: 80 mg via ORAL
  Filled 2016-06-14 (×2): qty 1

## 2016-06-14 MED ORDER — SODIUM CHLORIDE 0.9 % IR SOLN
Status: DC | PRN
  Administered 2016-06-14: 1

## 2016-06-14 MED ORDER — LACTATED RINGERS IV SOLN
INTRAVENOUS | Status: DC | PRN
  Administered 2016-06-14: 11:00:00 via INTRAVENOUS

## 2016-06-14 MED ORDER — KETOROLAC TROMETHAMINE 30 MG/ML IJ SOLN
INTRAMUSCULAR | Status: AC
  Administered 2016-06-14: 30 mg
  Filled 2016-06-14: qty 1

## 2016-06-14 MED ORDER — MORPHINE SULFATE (PF) 0.5 MG/ML IJ SOLN
INTRAMUSCULAR | Status: AC
  Filled 2016-06-14: qty 10

## 2016-06-14 MED ORDER — SODIUM BICARBONATE 8.4 % IV SOLN
INTRAVENOUS | Status: AC
  Filled 2016-06-14: qty 50

## 2016-06-14 MED ORDER — OXYCODONE-ACETAMINOPHEN 5-325 MG PO TABS
1.0000 | ORAL_TABLET | ORAL | Status: DC | PRN
  Administered 2016-06-15: 1 via ORAL
  Filled 2016-06-14: qty 1

## 2016-06-14 MED ORDER — MENTHOL 3 MG MT LOZG: 1.0000 | LOZENGE | OROMUCOSAL | Status: DC | PRN

## 2016-06-14 MED ORDER — ZOLPIDEM TARTRATE 5 MG PO TABS
5.0000 mg | ORAL_TABLET | Freq: Once | ORAL | Status: AC
  Administered 2016-06-14: 5 mg via ORAL
  Filled 2016-06-14: qty 1

## 2016-06-14 MED ORDER — MORPHINE SULFATE (PF) 0.5 MG/ML IJ SOLN
INTRAMUSCULAR | Status: DC | PRN
  Administered 2016-06-14: 4 mg via EPIDURAL
  Administered 2016-06-14: 1 mg via INTRAVENOUS

## 2016-06-14 MED ORDER — DEXAMETHASONE SODIUM PHOSPHATE 4 MG/ML IJ SOLN
INTRAMUSCULAR | Status: AC
  Filled 2016-06-14: qty 1

## 2016-06-14 MED ORDER — CEFAZOLIN SODIUM-DEXTROSE 2-4 GM/100ML-% IV SOLN
2.0000 g | Freq: Once | INTRAVENOUS | Status: AC
  Administered 2016-06-14: 2 g via INTRAVENOUS

## 2016-06-14 MED ORDER — SENNOSIDES-DOCUSATE SODIUM 8.6-50 MG PO TABS
2.0000 | ORAL_TABLET | ORAL | Status: DC
  Administered 2016-06-14 – 2016-06-15 (×2): 2 via ORAL
  Filled 2016-06-14 (×2): qty 2

## 2016-06-14 MED ORDER — DIBUCAINE 1 % RE OINT: 1.0000 "application " | TOPICAL_OINTMENT | RECTAL | Status: DC | PRN

## 2016-06-14 MED ORDER — TETANUS-DIPHTH-ACELL PERTUSSIS 5-2.5-18.5 LF-MCG/0.5 IM SUSP: 0.5000 mL | Freq: Once | INTRAMUSCULAR | Status: DC

## 2016-06-14 MED ORDER — HYDROMORPHONE HCL 1 MG/ML IJ SOLN: 0.2500 mg | INTRAMUSCULAR | Status: DC | PRN

## 2016-06-14 SURGICAL SUPPLY — 35 items
CHLORAPREP W/TINT 26ML (MISCELLANEOUS) ×3 IMPLANT
CLAMP CORD UMBIL (MISCELLANEOUS) IMPLANT
CLIP FILSHIE TUBAL LIGA STRL (Clip) ×3 IMPLANT
CLOTH BEACON ORANGE TIMEOUT ST (SAFETY) ×3 IMPLANT
DRSG OPSITE POSTOP 4X10 (GAUZE/BANDAGES/DRESSINGS) ×3 IMPLANT
ELECT REM PT RETURN 9FT ADLT (ELECTROSURGICAL) ×3
ELECTRODE REM PT RTRN 9FT ADLT (ELECTROSURGICAL) ×1 IMPLANT
EXTRACTOR VACUUM M CUP 4 TUBE (SUCTIONS) IMPLANT
EXTRACTOR VACUUM M CUP 4' TUBE (SUCTIONS)
GLOVE BIOGEL PI IND STRL 6.5 (GLOVE) ×1 IMPLANT
GLOVE BIOGEL PI IND STRL 7.0 (GLOVE) ×1 IMPLANT
GLOVE BIOGEL PI INDICATOR 6.5 (GLOVE) ×2
GLOVE BIOGEL PI INDICATOR 7.0 (GLOVE) ×2
GLOVE ECLIPSE 6.5 STRL STRAW (GLOVE) ×3 IMPLANT
GOWN STRL REUS W/TWL LRG LVL3 (GOWN DISPOSABLE) ×6 IMPLANT
KIT ABG SYR 3ML LUER SLIP (SYRINGE) IMPLANT
NEEDLE HYPO 25X5/8 SAFETYGLIDE (NEEDLE) IMPLANT
NS IRRIG 1000ML POUR BTL (IV SOLUTION) ×3 IMPLANT
PACK C SECTION WH (CUSTOM PROCEDURE TRAY) ×3 IMPLANT
PAD ABD 7.5X8 STRL (GAUZE/BANDAGES/DRESSINGS) IMPLANT
PAD ABD 8X7 1/2 STERILE (GAUZE/BANDAGES/DRESSINGS) ×3 IMPLANT
PAD OB MATERNITY 4.3X12.25 (PERSONAL CARE ITEMS) ×3 IMPLANT
PENCIL SMOKE EVAC W/HOLSTER (ELECTROSURGICAL) ×3 IMPLANT
RTRCTR C-SECT PINK 25CM LRG (MISCELLANEOUS) ×3 IMPLANT
SPONGE GAUZE 4X4 12PLY STER LF (GAUZE/BANDAGES/DRESSINGS) ×6 IMPLANT
STAPLER VISISTAT 35W (STAPLE) ×3 IMPLANT
SUT MON AB 2-0 CT1 27 (SUTURE) ×3 IMPLANT
SUT MON AB 4-0 PS1 27 (SUTURE) IMPLANT
SUT PDS AB 0 CTX 60 (SUTURE) IMPLANT
SUT PLAIN 2 0 XLH (SUTURE) IMPLANT
SUT VIC AB 0 CTX 36 (SUTURE) ×12
SUT VIC AB 0 CTX36XBRD ANBCTRL (SUTURE) ×4 IMPLANT
SUT VIC AB 4-0 KS 27 (SUTURE) IMPLANT
TOWEL OR 17X24 6PK STRL BLUE (TOWEL DISPOSABLE) ×3 IMPLANT
TRAY FOLEY CATH SILVER 14FR (SET/KITS/TRAYS/PACK) ×3 IMPLANT

## 2016-06-14 NOTE — Anesthesia Postprocedure Evaluation (Signed)
Anesthesia Post Note  Patient: Sharon Townsend  Procedure(s) Performed: Procedure(s) (LRB): CESAREAN SECTION (N/A)  Patient location during evaluation: PACU Anesthesia Type: Epidural Level of consciousness: awake Pain management: satisfactory to patient Vital Signs Assessment: post-procedure vital signs reviewed and stable Respiratory status: spontaneous breathing Cardiovascular status: blood pressure returned to baseline Postop Assessment: no headache and spinal receding Anesthetic complications: no        Last Vitals:  Vitals:   06/14/16 1800 06/14/16 2200  BP: 110/66   Pulse: 75 95  Resp: 18   Temp: 37 C 37.1 C    Last Pain:  Vitals:   06/14/16 2340  TempSrc:   PainSc: 3    Pain Goal: Patients Stated Pain Goal: 0 (06/14/16 2340)               Abrielle Finck EDWARD

## 2016-06-14 NOTE — Progress Notes (Signed)
Pt reports feeling comfortable, headache has resolved but is occasionally seeing spots in her vision.  Denies other vision change or RUQ pain.  Pt has been undergoing labor induction since Thursday with pitocin, followed by cytotec, followed by pitocin with foley bulb.  Foley bulb fell out last night and pitocin augmentation was continued.  AROM not possible last night or today d/t ballotable head.  Discussed options for continued mgmt and pt would like to proceed with primary cesarean section with bilateral tubal ligation.  Consent signed.

## 2016-06-14 NOTE — Transfer of Care (Signed)
Immediate Anesthesia Transfer of Care Note  Patient: Cristy FriedlanderBrittany R Faubert  Procedure(s) Performed: Procedure(s): CESAREAN SECTION (N/A)  Patient Location: PACU  Anesthesia Type:Epidural  Level of Consciousness: awake, alert  and oriented  Airway & Oxygen Therapy: Patient Spontanous Breathing  Post-op Assessment: Report given to RN  Post vital signs: Reviewed and stable  Last Vitals:  Vitals:   06/14/16 1115 06/14/16 1120  BP: 114/74 111/68  Pulse: 78 84  Resp: 18 18  Temp:      Last Pain:  Vitals:   06/14/16 1048  TempSrc: Oral  PainSc: 0-No pain      Patients Stated Pain Goal: 1 (06/13/16 1101)  Complications: No apparent anesthesia complications

## 2016-06-14 NOTE — Brief Op Note (Addendum)
06/11/2016 - 06/14/2016  12:31 PM  PATIENT:  Cristy FriedlanderBrittany R Patin  26 y.o. female  PRE-OPERATIVE DIAGNOSIS:  primary cesarean section with bilateral tubal ligation for failed induction  POST-OPERATIVE DIAGNOSIS:  primary cesarean section with bilateral tubal ligation for failed induction  PROCEDURE:  Procedure(s): CESAREAN SECTION (N/A)  SURGEON:  Surgeon(s) and Role:    * Philip AspenSidney Labrisha Wuellner, DO - Primary  FINDINGS: female infant, cephalic presentation, APGARS 1/619/10, wt pending, normal tubes and ovaries bilaterally, normal uterus  ASSISTANTS: Heather, RN  ANESTHESIA:   epidural  EBL:  Total I/O In: 1000 [I.V.:1000] Out: 1950 [Urine:1100; Blood:850]  BLOOD ADMINISTERED:none  SPECIMEN:  Source of Specimen:  cord blood  DISPOSITION OF SPECIMEN:  N/A  COUNTS:  YES  PLAN OF CARE: Admit to inpatient   PATIENT DISPOSITION:  PACU - hemodynamically stable.   Delay start of Pharmacological VTE agent (>24hrs) due to surgical blood loss or risk of bleeding: no

## 2016-06-14 NOTE — Progress Notes (Signed)
cervical exam 1/50/-3 soft extremely anterior cervix.

## 2016-06-14 NOTE — Anesthesia Postprocedure Evaluation (Signed)
Anesthesia Post Note  Patient: Sharon Townsend  Procedure(s) Performed: Procedure(s) (LRB): CESAREAN SECTION (N/A)  Patient location during evaluation: Mother Baby Anesthesia Type: Epidural Level of consciousness: awake Pain management: pain level controlled Vital Signs Assessment: post-procedure vital signs reviewed and stable Respiratory status: spontaneous breathing Cardiovascular status: stable Postop Assessment: no headache, no backache, epidural receding, patient able to bend at knees, no signs of nausea or vomiting and adequate PO intake Anesthetic complications: no        Last Vitals:  Vitals:   06/14/16 1330 06/14/16 1420  BP: 115/71 133/63  Pulse: 91 77  Resp: 15 18  Temp:  37.2 C    Last Pain:  Vitals:   06/14/16 1420  TempSrc: Oral  PainSc: 0-No pain   Pain Goal: Patients Stated Pain Goal: 1 (06/13/16 1101)               Sharon Townsend

## 2016-06-14 NOTE — Progress Notes (Signed)
Will d/c pitocin, Dr Claiborne Billingsallahan will come in to evaluate,

## 2016-06-14 NOTE — Progress Notes (Signed)
Alerted that pt's foley bulb came out. Rechecked and head ballotable so unable to AROM.  Will continue pitocin and recheck in am. FHT: 130 mod var +accels, no decels TOCO: q2-4 SVE: 4/50/-3

## 2016-06-15 LAB — CBC
HCT: 25.1 % — ABNORMAL LOW (ref 36.0–46.0)
HEMOGLOBIN: 7.8 g/dL — AB (ref 12.0–15.0)
MCH: 23 pg — AB (ref 26.0–34.0)
MCHC: 31.1 g/dL (ref 30.0–36.0)
MCV: 74 fL — AB (ref 78.0–100.0)
Platelets: 114 10*3/uL — ABNORMAL LOW (ref 150–400)
RBC: 3.39 MIL/uL — ABNORMAL LOW (ref 3.87–5.11)
RDW: 16.3 % — ABNORMAL HIGH (ref 11.5–15.5)
WBC: 9.3 10*3/uL (ref 4.0–10.5)

## 2016-06-15 LAB — BPAM RBC
BLOOD PRODUCT EXPIRATION DATE: 201804192359
BLOOD PRODUCT EXPIRATION DATE: 201804192359
Unit Type and Rh: 9500
Unit Type and Rh: 9500

## 2016-06-15 LAB — TYPE AND SCREEN
ABO/RH(D): O NEG
ANTIBODY SCREEN: POSITIVE
DAT, IgG: NEGATIVE
UNIT DIVISION: 0
Unit division: 0

## 2016-06-15 NOTE — Op Note (Signed)
NAME:  Sharon SmothersMURPHY, [name]                  ACCOUNT NO.:  MEDICAL RECORD NO.:  [id number]  LOCATION:                                 FACILITY:  PHYSICIAN:  Philip AspenSidney Idonna Heeren, DO    DATE OF BIRTH:  1990/08/04  DATE OF PROCEDURE:  06/14/2016 DATE OF DISCHARGE:                              OPERATIVE REPORT   PREOPERATIVE DIAGNOSIS:  Failed induction of labor.  POSTOPERATIVE DIAGNOSIS:  Failed induction of labor.  PROCEDURE:  Low-transverse cesarean section with bilateral tubal ligation.  SURGEON:  Philip AspenSidney Okey Zelek, DO  FINDINGS:  Female infant in cephalic presentation with Apgars 8 and 9. Weight pending.  Normal tubes and ovaries bilaterally and normal uterus.  ASSISTANT:  Heather, nurse, from [facility] acted as first assist.  ANESTHESIA:  Epidural.  IV FLUIDS:  1000 mL.  URINE OUTPUT:  1100 mL.  ESTIMATED BLOOD LOSS:  850 mL.  SPECIMENS:  Cord blood.  COMPLICATIONS:  None.  CONDITION:  Stable to PACU.  DESCRIPTION OF PROCEDURE:  The patient was taken to the operating room where epidural anesthesia was found to be adequate.  She was prepped and draped in the normal sterile fashion in dorsal supine position.  A Pfannenstiel skin incision was made with a scalpel and carried down to underlying layer of fascia with Bovie cautery.  The fascia was incised in the midline with a scalpel and extended laterally with Mayo scissors. Kocher clamps were placed at the superior aspect of the fascial incision, and rectus muscles were dissected off bluntly and sharply. Kocher clamps were then placed at the inferior aspect of the fascial incision, and rectus muscles were dissected off bluntly and sharply. The rectus muscles were separated in the midline with Metzenbaum.  The peritoneum was identified and entered bluntly.  The peritoneal incision was extended by manual lateral traction.  The abdomen was surveyed manually and found to be normal.  An Alexis self retractor was placed and the  vesicouterine peritoneum identified, tented, and entered sharply with Metzenbaum scissors.  This incision was extended laterally with Metzenbaum and the bladder flap was developed digitally.  A scalpel was used to make a low transverse cesarean incision.  The amniotic sac was entered sharply as it billowed from the incision.  The incision was extended by manual traction.  The infant's head was identified, elevated, and delivered without difficulty.  Following that, the infant's body was delivered without difficulty.  The cord was clamped and cut, and the infant was handed off to awaiting neonatology.  Cord blood was collected and massage of the external uterus along with gentle traction on the umbilical cord facilitated removal of the placenta.  The uterus was cleared of all clot and debris and the uterine incision was closed with 0 Vicryl in a running locked fashion followed by a second layer of horizontal imbrication and 1 figure-of-eight placed at the left side of the incision.  Excellent hemostasis was noted.  The right tube was identified, followed to its fimbriated end.  A Filshie clip was placed at the midpoint.  The left fallopian tube was also identified, held with Tanja PortBabcock and the Filshie clip was placed at the midpoint.  The Alexis self retractor was then removed.  The incision was re-examined and found to be hemostatic.  The peritoneum was reapproximated and closed in running fashion.  All surfaces were examined and found to be hemostatic.  The fascia was reapproximated and closed with 0 Vicryl in a running fashion.  The subcutaneous tissue was irrigated, dried and minimal use of Bovie cautery was needed for excellent hemostasis.  The skin was reapproximated and closed with staples.  Sponge, lap, and needle counts were correct x2.  The patient was taken to recovery in stable condition.          ______________________________ Philip Aspen, DO     Twisp/MEDQ  D:   06/14/2016  T:  06/14/2016  Job:  [id number]

## 2016-06-15 NOTE — Clinical Social Work Maternal (Signed)
CLINICAL SOCIAL WORK MATERNAL/CHILD NOTE  Patient Details  Name: Sharon Townsend MRN: [id number] Date of Birth: December 04, 1990  Date:  06/15/2016  Clinical Social Worker Initiating Note:  Terri Piedra, Marina Date/ Time Initiated:  06/15/16/1300     Child's Name:  Sharon Townsend   Legal Guardian:  Other (Comment) (Parents: Sharon Townsend and Sharon Townsend)   Need for Interpreter:  None   Date of Referral:  06/15/16     Reason for Referral:  Other (Comment) (Bipolar)   Referral Source:  Central Nursery   Address:  [address] [place],  [address]  Phone number:  [phone]   Household Members:  Significant Other, Minor Children (Couple has one other child, Sharon Townsend/age 31.5)   Natural Supports (not living in the home):  Immediate Family, Extended Family, Friends (MOB reports that she has a great support system in FOB and her sisters.)   Professional Supports: None   Employment:     Type of Work:     Education:      Pensions consultant:  [employer]   Other Resources:      Cultural/Religious Considerations Which May Impact Care: None stated.  MOB's facesheet notes religion as [place].  Strengths:  Ability to meet basic needs , Pediatrician chosen , Home prepared for child  Conservation officer, nature)   Risk Factors/Current Problems:  Mental Health Concerns  (Hx PMADs)   Cognitive State:  Alert , Able to Concentrate , Linear Thinking , Insightful , Goal Oriented    Mood/Affect:  Calm , Comfortable , Interested    CSW Assessment: CSW met with MOB, FOB and MOB's sister in her first floor room/127 to offer support and complete assessment due to Bipolar dx.  MOB was holding baby skin to skin/breast feeding when CSW arrived.  She was pleasant and welcoming.  CSW offered to return at a later time if she wished, but she stated that this was a good time to talk with her and that her visitors could stay.   MOB reports she was in labor for 4 days and then had a c-section.   She reports that she felt "awful" physically during her pregnancy and states she had the flu twice and the stomach bug.  She reports feeling well emotionally in spite of this.  She states she "got sad" and "had postpartum" after her first delivery (07/2014).  She states she reported her symptoms to her doctor and was prescribed medication, however, cannot recall the name.  She states her supports are much better at this time and she feels she is in a better situation.  She is not concerned about experiencing symptoms again and does not think she needs needs medication or counseling now.  She was attentive to education provided by CSW regarding signs and symptoms of PMADs and receptive to mental health resources given.  CSW inquired about dx of Bipolar.  MOB replied, "that was in my teenage years," adding that she does not feel it was an accurate dx.  She reports no symptoms of mania.  She states that her anxiety is triggered by large crowds, but otherwise she does well.  CSW asked how she copes with anxiety, and she pointed to FOB and states he helps calm her. MOB reports that she has all necessary supplies for infant at home and is aware of SIDS precautions.  She states no questions, concerns or needs at this time.  CSW Plan/Description:  Information/Referral to [employer] , No Further Intervention Required/No Barriers to Discharge,  Patient/Family Education     Alphonzo Cruise, [place] 06/15/2016, 3:41 PM

## 2016-06-15 NOTE — Lactation Note (Signed)
This note was copied from a baby's chart. Lactation Consultation Note  Patient Name: Sharon Toya SmothersBrittany Wingerter ZOXWR'UToday's Date: 06/15/2016 Reason for consult: Initial assessment;Other (Comment) (Early Term Infant)   Initial consult with Exp BF mom of 23 hour old early term infant. Infant with 4 BF for 10-50 minutes, 2 attempts, 2 voids, 1 stool, and 1 emesis since birth. Infant weight 7 lb 1.2 oz with weight loss of 1% since birth. LATCH score 7.  Room was dark and mom trying to rest, she did rouse up and speak with me. Discussed with parents that infant is considered an early term infant. Enc parents to feed infant STS 8-12 x in 24 hours at first feeding cues. Enc parents to awaken infant as needed every 3 hours if she is not cueing to feed. Feeding log given with instructions for use.   BF Resources Handout and LC Brochure given, mom informed of IP/OP Services, BF Support Groups and LC phone #. [facility] and plans to get a pump from them. Mom reports she BF her 1.5 yo for 1.5 years and just stopped BF. Enc parents to call out for feeding assistance as needed. Parents without questions/concerns at this time.      Maternal Data Formula Feeding for Exclusion: Yes Reason for exclusion: Mother's choice to formula and breast feed on admission Does the patient have breastfeeding experience prior to this delivery?: Yes  Feeding    LATCH Score/Interventions                      Lactation Tools Discussed/Used WIC Program: Yes   Consult Status Consult Status: Follow-up Date: 06/16/16 Follow-up type: In-patient    Silas FloodSharon S Yamil Oelke 06/15/2016, 11:13 AM

## 2016-06-16 LAB — BIRTH TISSUE RECOVERY COLLECTION (PLACENTA DONATION)

## 2016-06-16 MED ORDER — IBUPROFEN 600 MG PO TABS: 600.0000 mg | ORAL_TABLET | Freq: Four times a day (QID) | ORAL | 0 refills | Status: DC

## 2016-06-16 MED ORDER — OXYCODONE-ACETAMINOPHEN 5-325 MG PO TABS: 1.0000 | ORAL_TABLET | ORAL | 0 refills | Status: DC | PRN

## 2016-06-16 NOTE — Progress Notes (Signed)
POD#2 Pt without complaints. She would like to go home. Lochia-light VSSAF IMP/ Stable Plan/ Will remove staples and discharge.

## 2016-06-16 NOTE — Discharge Summary (Signed)
Obstetric Discharge Summary Reason for Admission: induction of labor Prenatal Procedures: NST and ultrasound Intrapartum Procedures: cesarean: low cervical, transverse Postpartum Procedures: none Complications-Operative and Postpartum: none Hemoglobin  Date Value Ref Range Status  06/15/2016 7.8 (L) 12.0 - 15.0 g/dL Final   HCT  Date Value Ref Range Status  06/15/2016 25.1 (L) 36.0 - 46.0 % Final    Physical Exam:  General: alert Lochia: appropriate Uterine Fundus: firm Incision: healing well DVT Evaluation: No evidence of DVT seen on physical exam.  Discharge Diagnoses: Failed induction and failure to progress and anemia  Discharge Information: Date: 06/16/2016 Activity: pelvic rest Diet: routine Medications: PNV, Ibuprofen, Iron and Percocet Condition: stable Instructions: refer to practice specific booklet Discharge to: home Follow-up Information    CALLAHAN, SIDNEY, DO. Schedule an appointment as soon as possible for a visit in 1 month(s).   Specialty:  Obstetrics and Gynecology Contact information: [address] [place] [address] [phone]           Newborn Data:   Doralee AlbinoMurphy, PendingBaby [[id number]][[id number]]  Live born unspecified sex  Birth Weight:   APGAR: ,    Eulah PontMurphy, Girl [place]any [[id number]][[id number]]  Live born female  Birth Weight: 7 lb 2.6 oz (3250 g) APGAR: 9, 10  Home with mother.  ANDERSON,MARK E 06/16/2016, 8:23 AM

## 2016-06-16 NOTE — Lactation Note (Signed)
This note was copied from a baby's chart. Lactation Consultation Note Mom's breast engorgement better. Cont. To be full w/some knots noted. Stressed importance of BF baby Q 2-3 hrs. Then POST-pump. Massage breast as pumping, ICE, laying flat at intervals if feels to tight.  Stress importance of not letting breast get full w/knots. LC will f/u today. Patient Name: Girl Toya SmothersBrittany Pabst ZOXWR'UToday's Date: 06/16/2016 Reason for consult: Follow-up assessment;Breast/nipple pain   Maternal Data    Feeding Feeding Type: Breast Fed Length of feed: 20 min  LATCH Score/Interventions          Comfort (Breast/Nipple): Engorged, cracked, bleeding, large blisters, severe discomfort Problem noted: Engorgment Intervention(s): Other (comment) (pump)           Lactation Tools Discussed/Used Pump Review: Setup, frequency, and cleaning;Milk Storage Initiated by:: RN Date initiated:: 06/15/16   Consult Status Consult Status: Follow-up Date: 06/16/16 Follow-up type: In-patient    Charyl DancerCARVER, Ilse Billman G 06/16/2016, 7:32 AM

## 2016-06-16 NOTE — Lactation Note (Signed)
This note was copied from a baby's chart. Lactation Consultation Note  Patient Name: Sharon Townsend Today's Date: 06/16/2016  Mom's breasts are full but not engorged.  Mom just got out of shower and pumping breasts.  Milk is flowing easily.  Mom denies questions/concerns at present.  Lactation outpatient services and support reviewed and encouraged.   Maternal Data    Feeding    LATCH Score/Interventions                      Lactation Tools Discussed/Used     Consult Status      Huston FoleyMOULDEN, Sheritha Louis S 06/16/2016, 11:33 AM

## 2016-06-17 ENCOUNTER — Telehealth: Payer: Self-pay | Admitting: *Deleted

## 2016-06-17 NOTE — Telephone Encounter (Signed)
Received message from [facility] stating that Medicaid requires prior authorization for the Rx for Zolpidem. Please call back to provide alternat Rx or prior authorization via Medicaid.  Per chart review, [facility] and delivered her baby by [facility] on 06/14/16. The Rx for this medication was given by Luna KitchensKathryn Kooistra, CNM on 3/13 following MAU visit which was prior to pt's delivery. I called [facility] and spoke with pharmacist and provided this information.

## 2016-07-15 DIAGNOSIS — Z8759 Personal history of other complications of pregnancy, childbirth and the puerperium: Secondary | ICD-10-CM | POA: Insufficient documentation

## 2016-08-04 ENCOUNTER — Inpatient Hospital Stay (HOSPITAL_COMMUNITY)
Admission: AD | Admit: 2016-08-04 | Discharge: 2016-08-04 | Disposition: A | Discharge status: Home / Self Care | Payer: Medicaid Other | Source: Ambulatory Visit | Attending: Obstetrics and Gynecology | Admitting: Obstetrics and Gynecology

## 2016-08-04 ENCOUNTER — Encounter (HOSPITAL_COMMUNITY): Payer: Self-pay

## 2016-08-04 DIAGNOSIS — F319 Bipolar disorder, unspecified: Secondary | ICD-10-CM | POA: Insufficient documentation

## 2016-08-04 DIAGNOSIS — R509 Fever, unspecified: Secondary | ICD-10-CM | POA: Diagnosis present

## 2016-08-04 DIAGNOSIS — K5909 Other constipation: Secondary | ICD-10-CM | POA: Insufficient documentation

## 2016-08-04 DIAGNOSIS — N644 Mastodynia: Secondary | ICD-10-CM | POA: Diagnosis present

## 2016-08-04 DIAGNOSIS — N61 Mastitis without abscess: Secondary | ICD-10-CM | POA: Insufficient documentation

## 2016-08-04 DIAGNOSIS — Z9049 Acquired absence of other specified parts of digestive tract: Secondary | ICD-10-CM | POA: Diagnosis not present

## 2016-08-04 MED ORDER — SULFAMETHOXAZOLE-TRIMETHOPRIM 800-160 MG PO TABS: 1.0000 | ORAL_TABLET | Freq: Two times a day (BID) | ORAL | 0 refills | Status: AC

## 2016-08-04 MED ORDER — TRAMADOL HCL 50 MG PO TABS: 50.0000 mg | ORAL_TABLET | Freq: Four times a day (QID) | ORAL | 0 refills | Status: DC | PRN

## 2016-08-04 NOTE — MAU Note (Signed)
Pt here with "flu-like symptoms--chills and joint pain, headache, and fever" and left breast pain. Delivered on 3/18 and is currently breast-feeding. Temp was 101 and took tylenol at 1300, temp is now 99.6.

## 2016-08-04 NOTE — Discharge Instructions (Signed)
Mastitis  Mastitis is inflammation of the breast tissue. It occurs most often in women who are breastfeeding, but it can also affect other women, and even sometimes men.  What are the causes?  Mastitis is usually caused by a bacterial infection. Bacteria enter the breast tissue through cuts or openings in the skin. Typically, this occurs with breastfeeding because of cracked or irritated skin. Sometimes, it can occur even when there is no opening in the skin. It can be associated with plugged milk (lactiferous) ducts. Nipple piercing can also lead to mastitis. Also, some forms of breast cancer can cause mastitis.  What are the signs or symptoms?  · Swelling, redness, tenderness, and pain in an area of the breast.  · Swelling of the glands under the arm on the same side.  · Fever.  If an infection is allowed to progress, a collection of pus (abscess) may develop.  How is this diagnosed?  Your health care provider can usually diagnose mastitis based on your symptoms and a physical exam. Tests may be done to help confirm the diagnosis. These may include:  · Removal of pus from the breast by applying pressure to the area. This pus can be examined in the lab to determine which bacteria are present. If an abscess has developed, the fluid in the abscess can be removed with a needle. This can also be used to confirm the diagnosis and determine the bacteria present. In most cases, pus will not be present.  · Blood tests to determine if your body is fighting a bacterial infection.  · Mammogram or ultrasound tests to rule out other problems or diseases.    How is this treated?  Antibiotic medicine is used to treat a bacterial infection. Your health care provider will determine which bacteria are most likely causing the infection and will select an appropriate antibiotic. This is sometimes changed based on the results of tests performed to identify the bacteria, or if there is no response to the antibiotic selected. Antibiotics  are usually given by mouth. You may also be given medicine for pain.  Mastitis that occurs with breastfeeding will sometimes go away on its own, so your health care provider may choose to wait 24 hours after first seeing you to decide whether a prescription medicine is needed.  Follow these instructions at home:  · Only take over-the-counter or prescription medicines for pain, fever, or discomfort as directed by your health care provider.  · If your health care provider prescribed an antibiotic, take the medicine as directed. Make sure you finish it even if you start to feel better.  · Do not wear a tight or underwire bra. Wear a soft, supportive bra.  · Increase your fluid intake, especially if you have a fever.  · Women who are breastfeeding should follow these instructions:  ? Continue to empty the breast. Your health care provider can tell you whether this milk is safe for your infant or needs to be thrown out. You may be told to stop nursing until your health care provider thinks it is safe for your baby. Use a breast pump if you are advised to stop nursing.  ? Keep your nipples clean and dry.  ? Empty the first breast completely before going to the other breast. If your baby is not emptying your breasts completely for some reason, use a breast pump to empty your breasts.  ? If you go back to work, pump your breasts while at work to stay   in time with your nursing schedule.  ? Avoid allowing your breasts to become overly filled with milk (engorged).  Contact a health care provider if:  · You have pus-like discharge from the breast.  · Your symptoms do not improve with the treatment prescribed by your health care provider within 2 days.  Get help right away if:  · Your pain and swelling are getting worse.  · You have pain that is not controlled with medicine.  · You have a red line extending from the breast toward your armpit.  · You have a fever or persistent symptoms for more than 2-3 days.  · You have a fever  and your symptoms suddenly get worse.  This information is not intended to replace advice given to you by your health care provider. Make sure you discuss any questions you have with your health care provider.  Document Released: 03/16/2005 Document Revised: 08/22/2015 Document Reviewed: 10/14/2012  Elsevier Interactive Patient Education © 2017 [employer]

## 2016-08-04 NOTE — MAU Provider Note (Signed)
  History     CSN: [id number]  Arrival date and time: 08/04/16 1857      Chief Complaint  Patient presents with  . Breast Pain   HPI  26 yo P2 6 weeks pp s/p C/S and BTL with the complaint of left breast pain and low grade fever. She has a h/o mastitis 3 weeks pp after her first pregnancy. This feels the same. She is breast feeding.   Past Medical History:  Diagnosis Date  . Bipolar disorder (HCC)   . Chronic constipation   . Gall bladder stones   . Headache   . Renal disorder    two tubes in left kidney going to bladder    Past Surgical History:  Procedure Laterality Date  . CESAREAN SECTION N/A 06/14/2016   Procedure: CESAREAN SECTION;  Surgeon: Philip AspenSidney Callahan, DO;  [facility];  Service: Obstetrics;  Laterality: N/A;  . CHOLECYSTECTOMY    . MANDIBLE SURGERY    . renal stents    . TONSILLECTOMY      Family History  Problem Relation Age of Onset  . Diabetes Mother   . Diabetes Father   . Hypertension Father     Social History  Substance Use Topics  . Smoking status: Never Smoker  . Smokeless tobacco: Never Used  . Alcohol use No     Comment: before preg    Allergies:  Allergies  Allergen Reactions  . Peanuts [Peanut Oil] Anaphylaxis  . Latex Swelling  . Penicillins Other (See Comments)    Reaction:  Pt states that it "shuts down her bowels and cannot urinate" Has patient had a PCN reaction causing immediate rash, facial/tongue/throat swelling, SOB or lightheadedness with hypotension: No Has patient had a PCN reaction causing severe rash involving mucus membranes or skin necrosis: No Has patient had a PCN reaction that required hospitalization No Has patient had a PCN reaction occurring within the last 10 years: No If all of the above answers are "NO", then may proceed with Cephalosp    Prescriptions Prior to Admission  Medication Sig Dispense Refill Last Dose  . ibuprofen (ADVIL,MOTRIN) 600 MG tablet Take 1 tablet (600 mg total) by  mouth every 6 (six) hours. 30 tablet 0   . oxyCODONE-acetaminophen (PERCOCET/ROXICET) 5-325 MG tablet Take 1 tablet by mouth every 4 (four) hours as needed (pain scale 4-7). 30 tablet 0   . Prenatal Vit-Fe Fumarate-FA (PRENATAL MULTIVITAMIN) TABS tablet Take 1 tablet by mouth daily at 12 noon.   06/11/2016 at Unknown time    Review of Systems Physical Exam   Blood pressure 121/76, pulse 100, temperature 99.6 F (37.6 C), resp. rate 20, height 5' (1.524 m), weight 78 kg (172 lb), SpO2 100 %, unknown if currently breastfeeding.  Physical Exam  WNWHFNAD Heart- rrr Lungs- CTAB Abd- benign Left breast significantly larger than the right, tender, some firmness in the UIQ, minimal erythema  MAU Course  Procedures  MDM  Assessment and Plan  Early left mastitis- She has a penicillin allergy so I will treat with bactrim DS for a week   Sharon Townsend 08/04/2016, 7:44 PM

## 2016-10-09 NOTE — Addendum Note (Signed)
Addendum  created 10/09/16 1421 by Cristela BlueJackson, [place], MD   Sign clinical note

## 2016-10-09 NOTE — Anesthesia Postprocedure Evaluation (Signed)
Anesthesia Post Note  Patient: Sharon Townsend  Procedure(s) Performed: Procedure(s) (LRB): CESAREAN SECTION (N/A)     Anesthesia Post Evaluation  Last Vitals:  Vitals:   06/15/16 1809 06/16/16 0527  BP: 112/60 114/65  Pulse: 86 92  Resp: 20 18  Temp: 36.7 C 36.8 C    Last Pain:  Vitals:   06/16/16 0900  TempSrc:   PainSc: 5                  Maliea Grandmaison EDWARD

## 2016-10-16 ENCOUNTER — Telehealth: Payer: Self-pay | Admitting: Licensed Clinical Social Worker

## 2016-10-16 NOTE — Telephone Encounter (Signed)
[facility] attempted to contact Sharon Townsend to follow up about support due to EPDS score, 9 per Dr. Duffy RhodyStanley. Recieved no answer, left a message to return telephone call.

## 2017-04-06 ENCOUNTER — Encounter (HOSPITAL_COMMUNITY): Payer: Self-pay | Admitting: Emergency Medicine

## 2017-04-06 ENCOUNTER — Other Ambulatory Visit: Payer: Self-pay

## 2017-04-06 ENCOUNTER — Emergency Department (HOSPITAL_COMMUNITY)
Admission: EM | Admit: 2017-04-06 | Discharge: 2017-04-06 | Disposition: A | Discharge status: Home / Self Care | Payer: Medicaid Other | Attending: Emergency Medicine | Admitting: Emergency Medicine

## 2017-04-06 DIAGNOSIS — M545 Low back pain: Secondary | ICD-10-CM | POA: Diagnosis not present

## 2017-04-06 DIAGNOSIS — Z79899 Other long term (current) drug therapy: Secondary | ICD-10-CM | POA: Insufficient documentation

## 2017-04-06 DIAGNOSIS — Z91040 Latex allergy status: Secondary | ICD-10-CM | POA: Insufficient documentation

## 2017-04-06 DIAGNOSIS — R1030 Lower abdominal pain, unspecified: Secondary | ICD-10-CM | POA: Insufficient documentation

## 2017-04-06 DIAGNOSIS — R300 Dysuria: Secondary | ICD-10-CM | POA: Diagnosis not present

## 2017-04-06 DIAGNOSIS — Z91010 Allergy to peanuts: Secondary | ICD-10-CM | POA: Insufficient documentation

## 2017-04-06 LAB — URINALYSIS, ROUTINE W REFLEX MICROSCOPIC
Bilirubin Urine: NEGATIVE
GLUCOSE, UA: NEGATIVE mg/dL
HGB URINE DIPSTICK: NEGATIVE
KETONES UR: NEGATIVE mg/dL
LEUKOCYTES UA: NEGATIVE
Nitrite: NEGATIVE
PROTEIN: NEGATIVE mg/dL
Specific Gravity, Urine: 1.025 (ref 1.005–1.030)
pH: 6 (ref 5.0–8.0)

## 2017-04-06 LAB — POC URINE PREG, ED: PREG TEST UR: NEGATIVE

## 2017-04-06 MED ORDER — IBUPROFEN 600 MG PO TABS: 600.0000 mg | ORAL_TABLET | Freq: Four times a day (QID) | ORAL | 0 refills | Status: DC | PRN

## 2017-04-06 NOTE — ED Provider Notes (Signed)
[facility] EMERGENCY DEPARTMENT Provider Note   CSN: [id number] Arrival date & time: 04/06/17  1555     History   Chief Complaint Chief Complaint  Patient presents with  . Abdominal Pain  . Back Pain  . Dysuria    HPI Sharon Townsend is a 27 y.o. female.  Patient reports pain along the left lateral aspect of her c-section incision radiating towards her left lower back. No urinary symptoms. No nausea, vomiting, diarrhea, constipation. Back pain located in left lateral aspect of the lower back.   The history is provided by the patient. No language interpreter was used.  Abdominal Pain   This is a new problem. The problem occurs daily. The problem has not changed since onset.The pain is located in the suprapubic region. The pain is mild. Associated symptoms include dysuria. Pertinent negatives include diarrhea and nausea.  Back Pain   Associated symptoms include abdominal pain and dysuria.  Dysuria   Pertinent negatives include no nausea.    Past Medical History:  Diagnosis Date  . Bipolar disorder (HCC)   . Chronic constipation   . Gall bladder stones   . Headache   . Renal disorder    two tubes in left kidney going to bladder    Patient Active Problem List   Diagnosis Date Noted  . Gestational hypertension w/o significant proteinuria in 3rd trimester 06/11/2016  . Preeclampsia, third trimester 06/09/2016  . False labor before 37 completed weeks of gestation during pregnancy in third trimester, antepartum 06/07/2016  . Chronic cholecystitis with calculus 10/03/2014    Past Surgical History:  Procedure Laterality Date  . CESAREAN SECTION N/A 06/14/2016   Procedure: CESAREAN SECTION;  Surgeon: Philip Aspen, DO;  Location: [facility] BIRTHING SUITES;  Service: Obstetrics;  Laterality: N/A;  . CHOLECYSTECTOMY    . MANDIBLE SURGERY    . renal stents    . TONSILLECTOMY      OB History    Gravida Para Term Preterm AB Living   4 3 1 2 1 2    SAB TAB  Ectopic Multiple Live Births   1 0 0 0 2       Home Medications    Prior to Admission medications   Medication Sig Start Date End Date Taking? Authorizing Provider  ibuprofen (ADVIL,MOTRIN) 600 MG tablet Take 1 tablet (600 mg total) by mouth every 6 (six) hours. 06/16/16   Levi Aland, MD  oxyCODONE-acetaminophen (PERCOCET/ROXICET) 5-325 MG tablet Take 1 tablet by mouth every 4 (four) hours as needed (pain scale 4-7). 06/16/16   Levi Aland, MD  Prenatal Vit-Fe Fumarate-FA (PRENATAL MULTIVITAMIN) TABS tablet Take 1 tablet by mouth daily at 12 noon.    [provider]  traMADol (ULTRAM) 50 MG tablet Take 1 tablet (50 mg total) by mouth every 6 (six) hours as needed. 08/04/16   Allie Bossier, MD    Family History Family History  Problem Relation Age of Onset  . Diabetes Mother   . Diabetes Father   . Hypertension Father     Social History Social History   Tobacco Use  . Smoking status: Never Smoker  . Smokeless tobacco: Never Used  Substance Use Topics  . Alcohol use: No    Comment: before preg  . Drug use: No     Allergies   Peanuts [peanut oil]; Latex; and Penicillins   Review of Systems Review of Systems  Gastrointestinal: Positive for abdominal pain. Negative for diarrhea and nausea.  Genitourinary:  Positive for dysuria.  Musculoskeletal: Positive for back pain.  All other systems reviewed and are negative.    Physical Exam Updated Vital Signs BP 122/75 (BP Location: Right Arm)   Pulse 76   Temp 99.2 F (37.3 C) (Oral)   Resp 17   Ht 5\' 1"  (1.549 m)   Wt 79.4 kg (175 lb)   LMP 03/16/2017   SpO2 99%   BMI 33.07 kg/m   Physical Exam  Constitutional: She is oriented to person, place, and time. She appears well-developed and well-nourished. No distress.  HENT:  Head: Normocephalic and atraumatic.  Eyes: Conjunctivae are normal.  Neck: Neck supple.  Cardiovascular: Normal rate and regular rhythm.  No murmur heard. Pulmonary/Chest:  Effort normal and breath sounds normal. No respiratory distress.  Abdominal: Soft. Bowel sounds are normal. There is tenderness.  Patient with TTP along left lateral aspect of well-healed surgical scar. No bulge or indication of hernia noted.  Musculoskeletal: Normal range of motion. She exhibits no edema.  Neurological: She is alert and oriented to person, place, and time.  Skin: Skin is warm and dry.  Psychiatric: She has a normal mood and affect.  Nursing note and vitals reviewed.    ED Treatments / Results  Labs (all labs ordered are listed, but only abnormal results are displayed) Labs Reviewed  URINALYSIS, ROUTINE W REFLEX MICROSCOPIC - Abnormal; Notable for the following components:      Result Value   APPearance HAZY (*)    All other components within normal limits  POC URINE PREG, ED    EKG  EKG Interpretation None       Radiology No results found.  Procedures Procedures (including critical care time)  Medications Ordered in ED Medications - No data to display   Initial Impression / Assessment and Plan / ED Course  I have reviewed the triage vital signs and the nursing notes.  Pertinent labs & imaging results that were available during my care of the patient were reviewed by me and considered in my medical decision making (see chart for details).     Patient is nontoxic, nonseptic appearing, in no apparent distress.  Labs and vitals reviewed.  Patient does not meet the SIRS or Sepsis criteria.  Patient does not have a surgical abdomen and there are no peritoneal signs.  Not pregnant. No  indication of UTI.  Patient discharged home with symptomatic treatment and given strict instructions for follow-up with their primary care physician.  I have also discussed reasons to return immediately to the ER.  Patient expresses understanding and agrees with plan.    Final Clinical Impressions(s) / ED Diagnoses   Final diagnoses:  Lower abdominal pain    ED  Discharge Orders        Ordered    ibuprofen (ADVIL,MOTRIN) 600 MG tablet  Every 6 hours PRN     04/06/17 1739       Felicie MornSmith, Nimesh Riolo, NP 04/06/17 1741    Gerhard MunchLockwood, Robert, MD 04/07/17 [phone]

## 2017-04-06 NOTE — ED Triage Notes (Signed)
Pt. Stated, Im having pain were my incision 10 months ago at the C-section area. It also goes to my back. Hurts after I pee Denies any other symptoms.

## 2017-07-23 IMAGING — DX DG CERVICAL SPINE COMPLETE 4+V
6 series · 6 of 6 positions shown · non-contrast
Comparison: 10/01/2014

CLINICAL DATA: Neck pain following fall 2 days ago, initial
encounter

EXAM:
CERVICAL SPINE - COMPLETE 4+ VIEW

[w cervical spine lat]
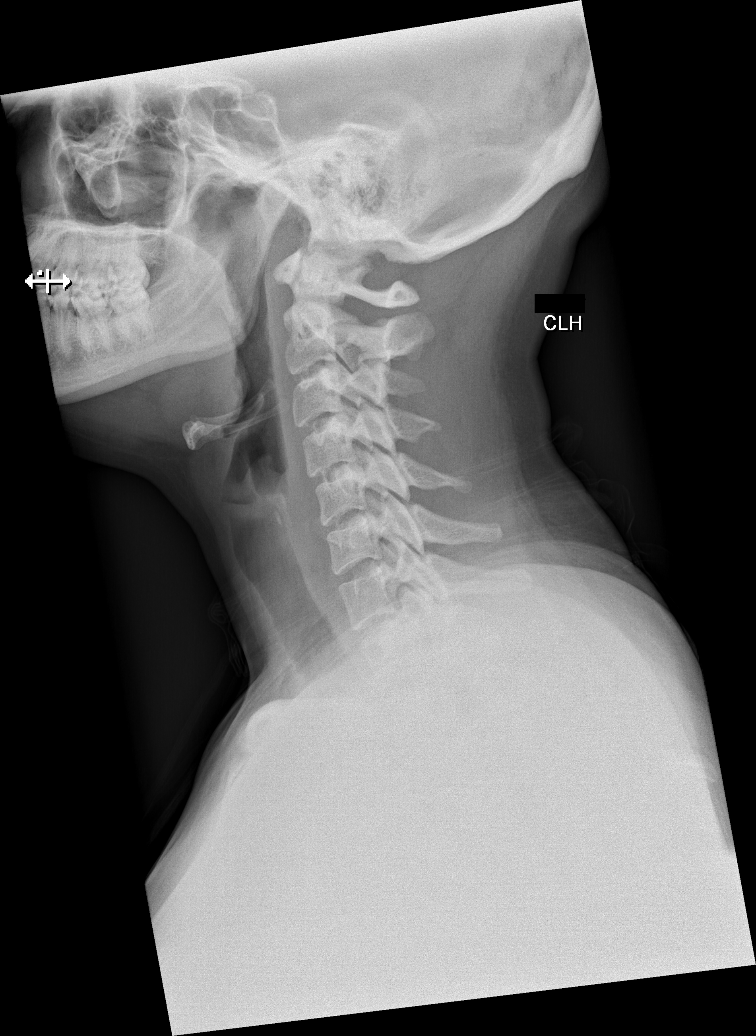

[w cervical spine ap_obl (1 of 3)]
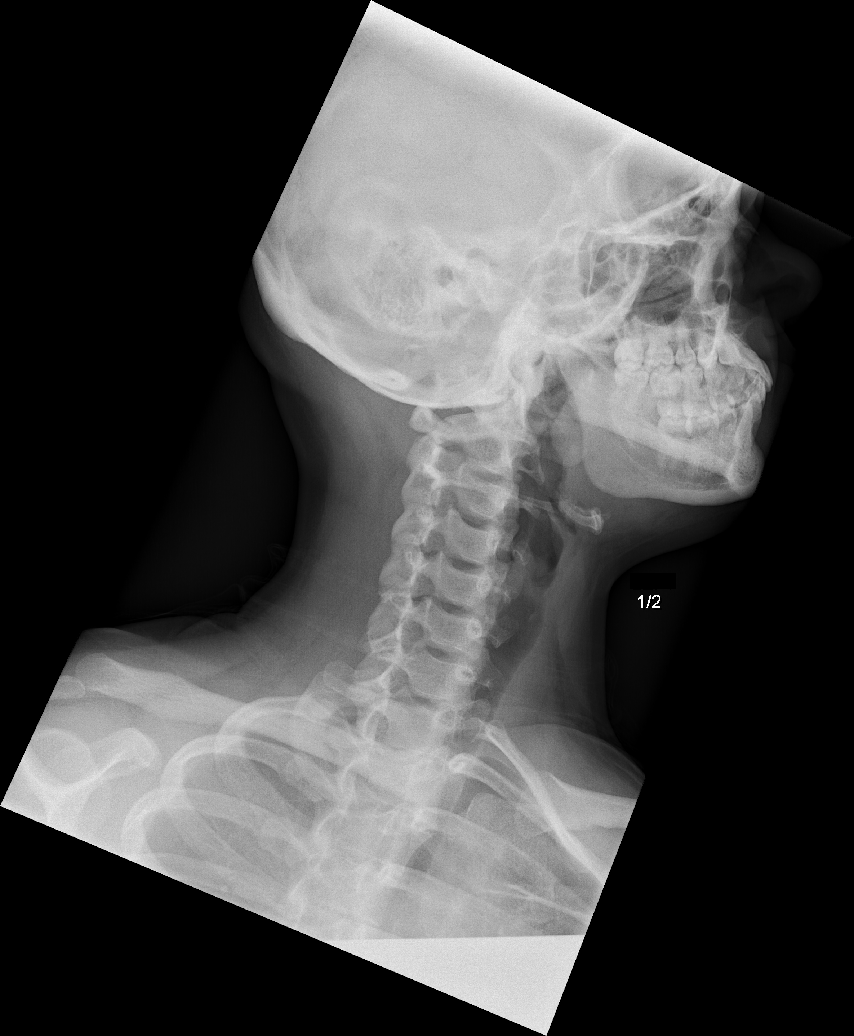

[w cervical spine ap_obl (2 of 3)]
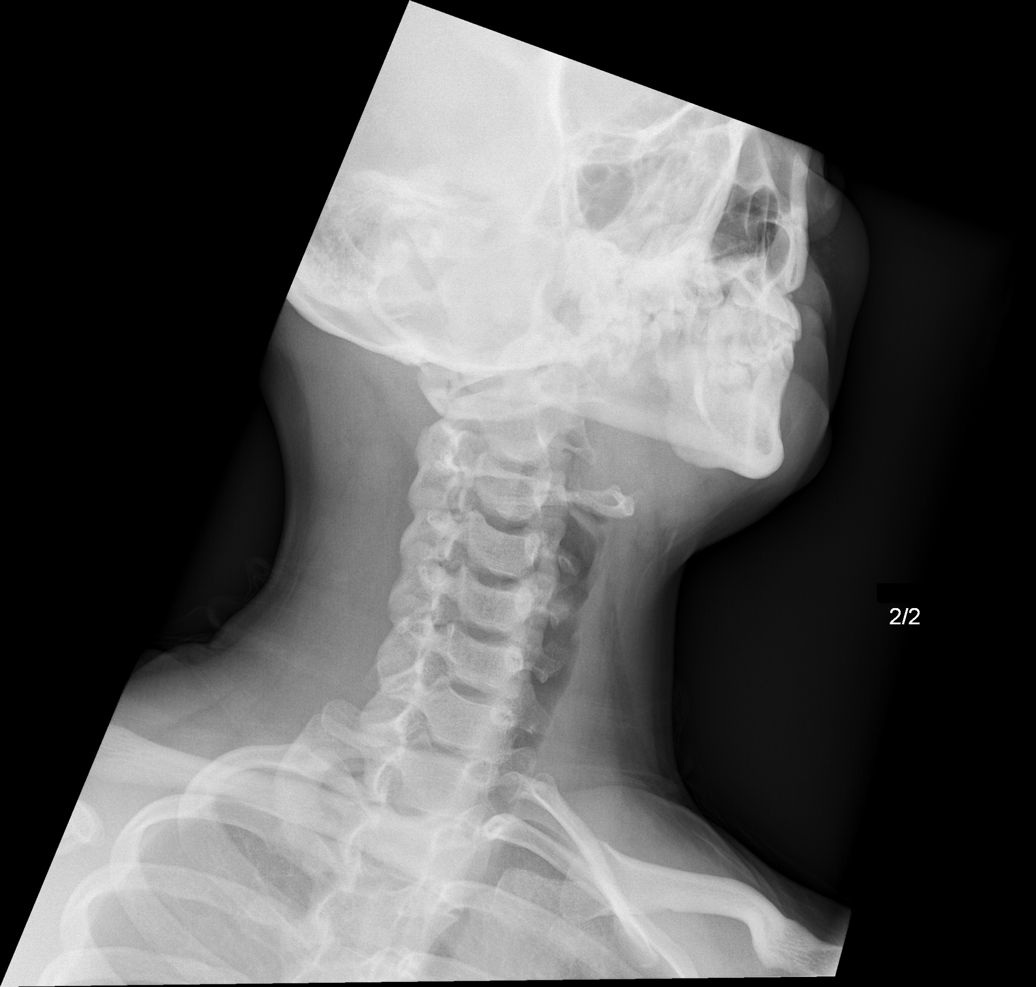

[w cervical spine ap_obl (3 of 3)]
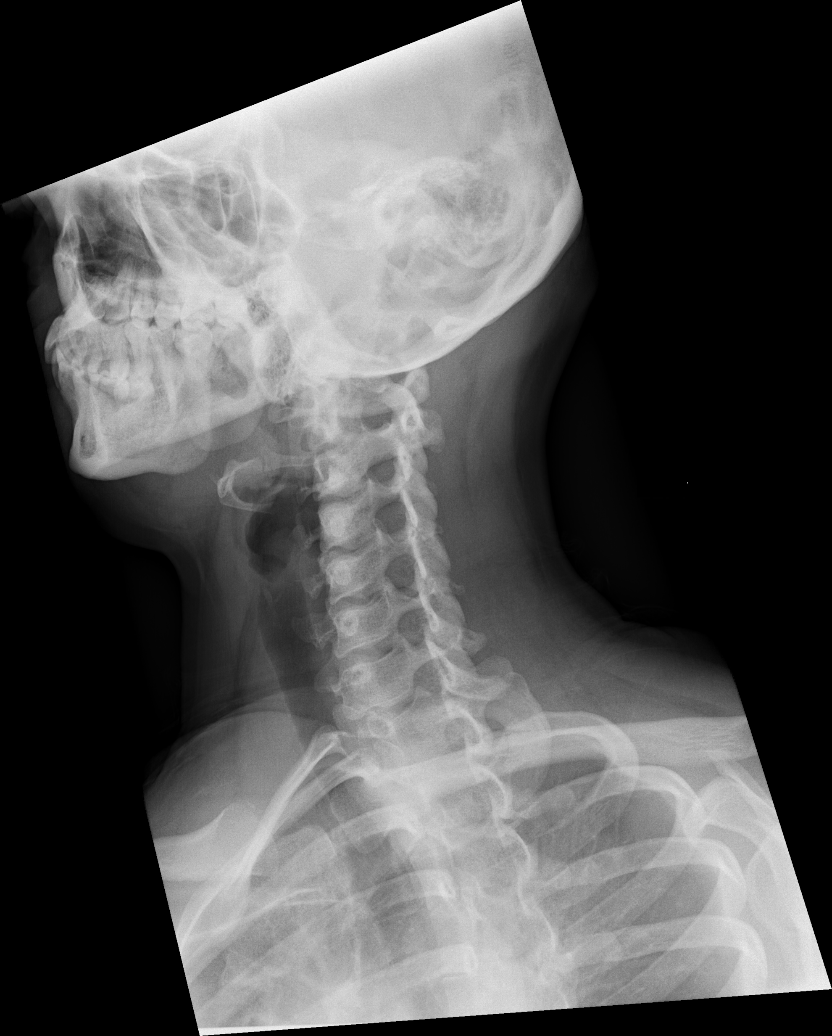

[w cervical spine ap]
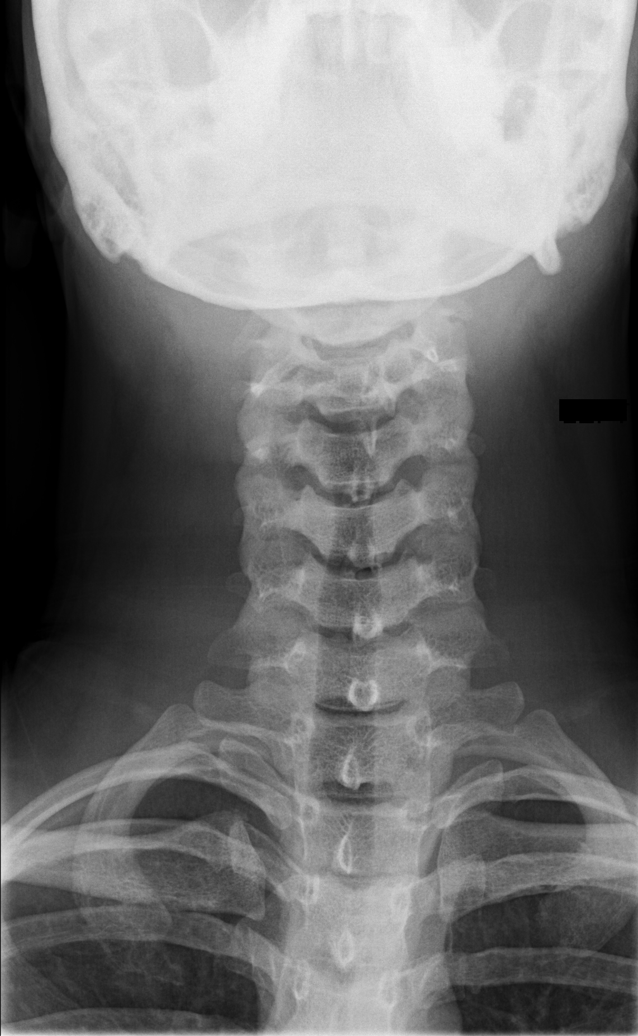

[w cervical spine odontoid]
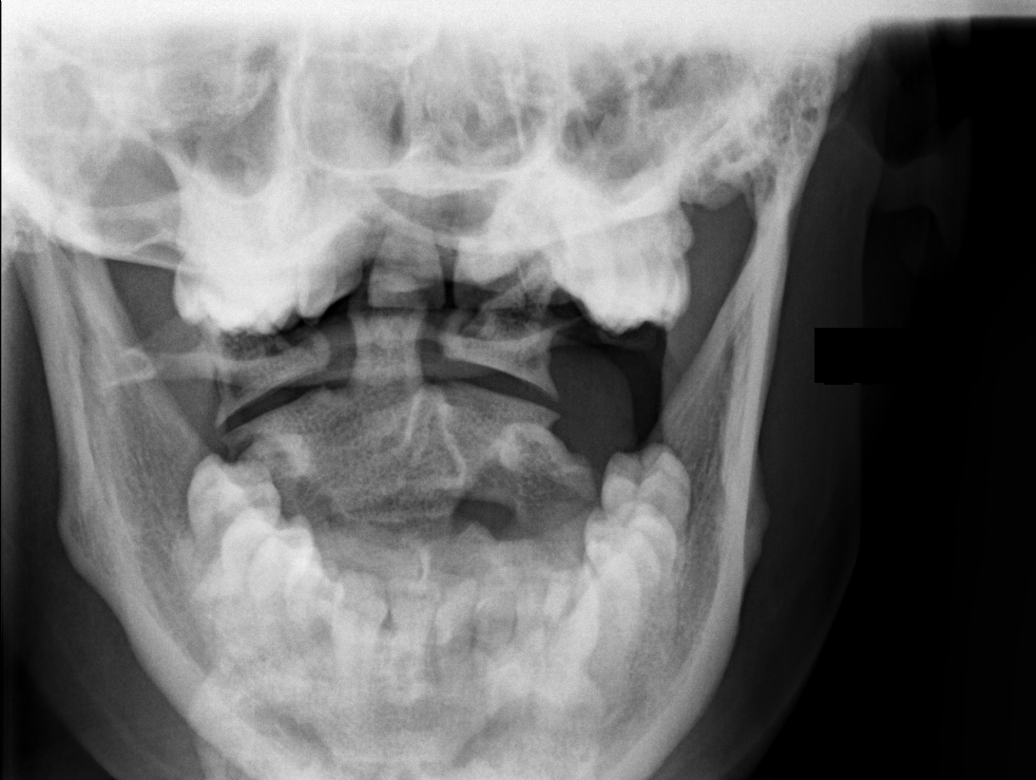

[6 of 6 positions shown; findings below may reference images not displayed]

FINDINGS: There is no evidence of cervical spine fracture or prevertebral soft
tissue swelling. Alignment is normal. No other significant bone
abnormalities are identified.
IMPRESSION: No acute abnormality noted.

## 2017-11-21 ENCOUNTER — Ambulatory Visit (HOSPITAL_COMMUNITY)
Admission: EM | Admit: 2017-11-21 | Discharge: 2017-11-21 | Disposition: A | Discharge status: Home / Self Care | Payer: Medicaid Other | Attending: Physician Assistant | Admitting: Physician Assistant

## 2017-11-21 ENCOUNTER — Other Ambulatory Visit: Payer: Self-pay

## 2017-11-21 DIAGNOSIS — M25562 Pain in left knee: Secondary | ICD-10-CM | POA: Diagnosis not present

## 2017-11-21 MED ORDER — PREDNISONE 20 MG PO TABS: ORAL_TABLET | ORAL | 0 refills | Status: AC

## 2017-11-21 NOTE — Discharge Instructions (Signed)
Start the prednisone and use the knee sleeve with ice.  Call Dr. Nilsa Nuttinglin's office for an appointment if you are still not improving.

## 2017-11-21 NOTE — ED Provider Notes (Signed)
11/21/2017 6:38 PM   DOB: [birth year] / MRN: [id number]  SUBJECTIVE:  Sharon Townsend is a 27 y.o. female presenting for unimproving left knee pain that  that started about 1 week ago after a fall.  Denies fever.  Has tried ibuprofen, tylenol.    She is allergic to peanuts [peanut oil]; latex; and penicillins.   She  has a past medical history of Bipolar disorder (HCC), Chronic constipation, Gall bladder stones, Headache, and Renal disorder.    She  reports that she has never smoked. She has never used smokeless tobacco. She reports that she does not drink alcohol or use drugs. She  reports that she currently engages in sexual activity. She reports using the following method of birth control/protection: None. The patient  has a past surgical history that includes renal stents; Tonsillectomy; Mandible surgery; Cholecystectomy; and Cesarean section (N/A, 06/14/2016).  Her family history includes Diabetes in her father and mother; Hypertension in her father.  Review of Systems  Constitutional: Negative for chills, diaphoresis and fever.  Eyes: Negative.   Respiratory: Negative for cough, hemoptysis, sputum production, shortness of breath and wheezing.   Cardiovascular: Negative for chest pain, orthopnea and leg swelling.  Gastrointestinal: Negative for nausea.  Skin: Negative for rash.  Neurological: Negative for dizziness, sensory change, speech change, focal weakness and headaches.    OBJECTIVE:  BP 108/74   Pulse 81   Temp 98.3 F (36.8 C)   Resp 18   Wt 165 lb (74.8 kg)   LMP 11/07/2017   SpO2 98%   BMI 31.18 kg/m   Wt Readings from Last 3 Encounters:  11/21/17 165 lb (74.8 kg)  04/06/17 175 lb (79.4 kg)  08/04/16 172 lb (78 kg)   Temp Readings from Last 3 Encounters:  11/21/17 98.3 F (36.8 C)  04/06/17 98.9 F (37.2 C) (Oral)  08/04/16 100.2 F (37.9 C) (Oral)   BP Readings from Last 3 Encounters:  11/21/17 108/74  04/06/17 110/81  08/04/16 126/82   Pulse  Readings from Last 3 Encounters:  11/21/17 81  04/06/17 81  08/04/16 (!) 105      Physical Exam  Constitutional: She is oriented to person, place, and time. She appears well-nourished. No distress.  Eyes: Pupils are equal, round, and reactive to light. EOM are normal.  Cardiovascular: Normal rate.  Pulmonary/Chest: Effort normal.  Abdominal: She exhibits no distension.  Musculoskeletal: She exhibits no edema, tenderness or deformity.       Left knee: She exhibits normal range of motion, no swelling, no effusion, no ecchymosis, no deformity, no laceration, no erythema, normal alignment, no LCL laxity, normal patellar mobility, no bony tenderness, normal meniscus and no MCL laxity. No tenderness found. No medial joint line, no lateral joint line, no MCL, no LCL and no patellar tendon tenderness noted.  Neurological: She is alert and oriented to person, place, and time. No cranial nerve deficit. Gait normal.  Skin: Skin is dry. She is not diaphoretic.  Psychiatric: She has a normal mood and affect.  Vitals reviewed.   No results found for this or any previous visit (from the past 72 hour(s)).  No results found.  ASSESSMENT AND PLAN:   Acute pain of left knee    Discharge Instructions     Start the prednisone and use the knee sleeve with ice.  Call Dr. Nilsa Nutting office for an appointment if you are still not improving.         The patient is advised to call or  return to clinic if she does not see an improvement in symptoms, or to seek the care of the closest emergency department if she worsens with the above plan.   Deliah BostonMichael Airanna Partin, MHS, PA-C 11/21/2017 6:38 PM   Ofilia Neaslark, Dontay Harm L, PA-C 11/21/17 1839

## 2017-11-21 NOTE — ED Triage Notes (Signed)
Pt was her e a week ago and having pain and swelling in her left knee.

## 2017-12-10 IMAGING — US US OB COMP LESS 14 WK
1 series · 15 of 28 positions shown · non-contrast
Comparison: None relevant

CLINICAL DATA: 25-year-old female with cramping in the first
trimester of pregnancy. Quantitative beta HCG pending. Estimated
gestational age by LMP 5 weeks 2 days. Initial encounter.

EXAM:
OBSTETRIC <14 WK US AND TRANSVAGINAL OB US
TECHNIQUE: Both transabdominal and transvaginal ultrasound examinations were
performed for complete evaluation of the gestation as well as the
maternal uterus, adnexal regions, and pelvic cul-de-sac.
Transvaginal technique was performed to assess early pregnancy.

[Series 1: us ob comp less 14 wk · 15 of 53 slices shown]
[im 1/53]
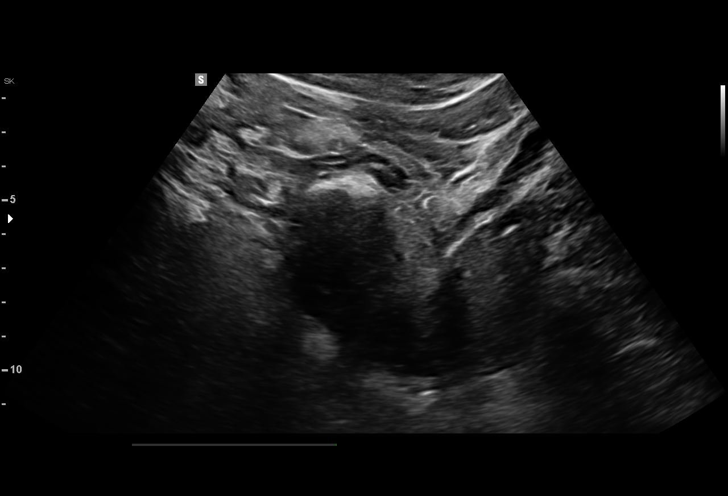
[im 4/53]
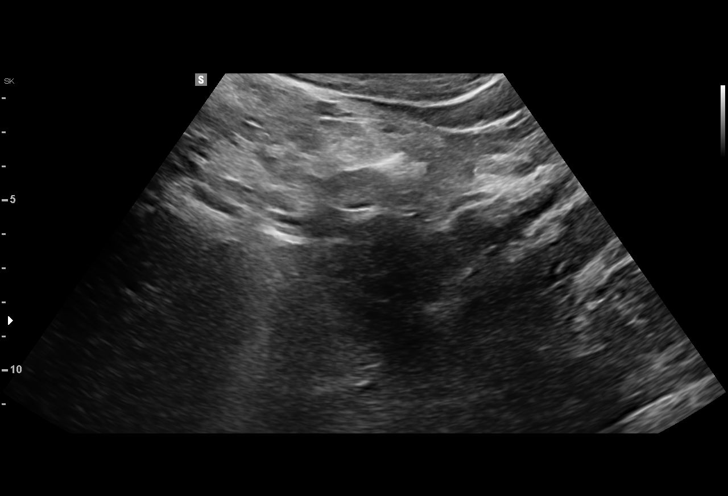
[im 8/53]
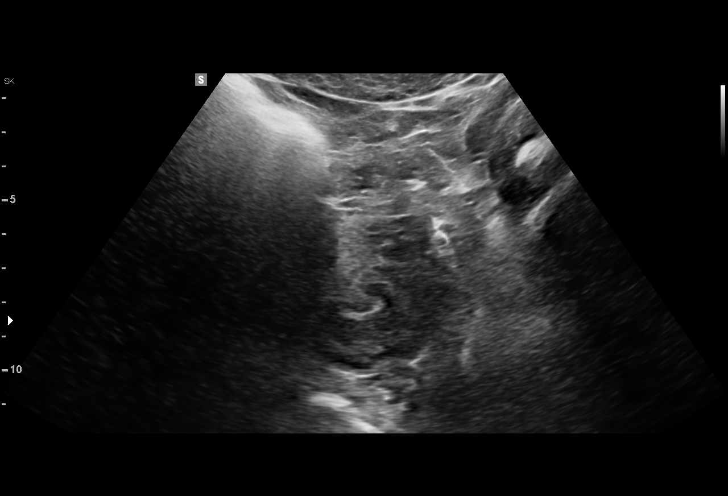
[im 12/53]
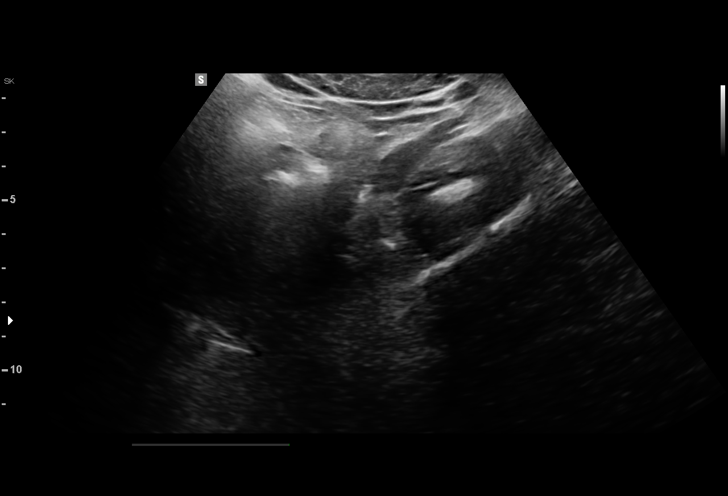
[im 16/53]
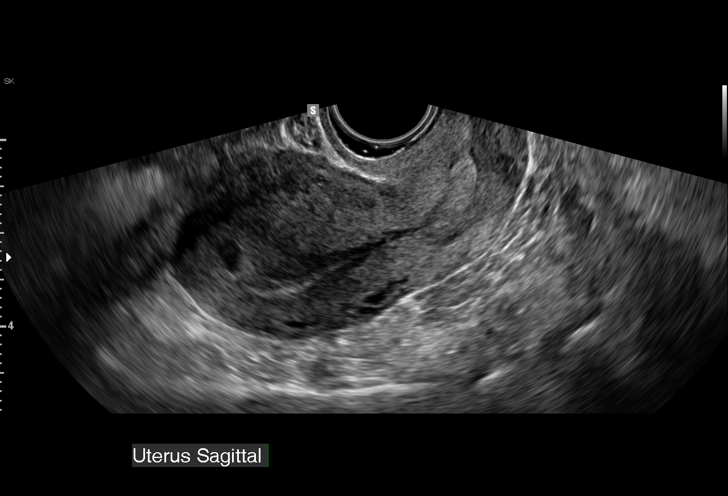
[im 20/53]
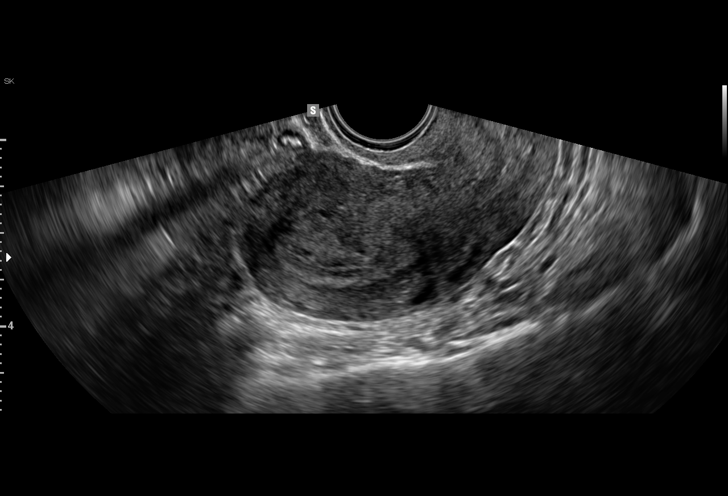
[im 24/53]
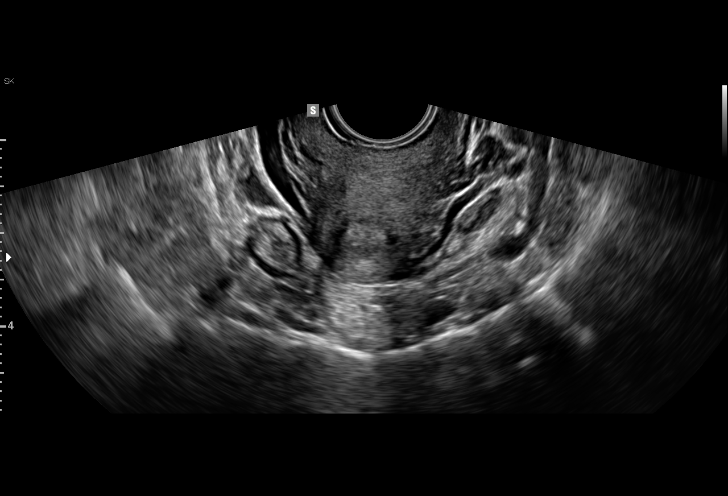
[im 27/53]
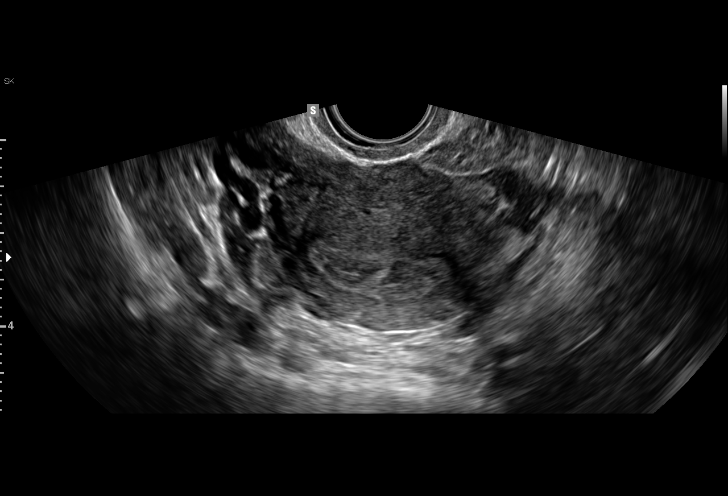
[im 29/53]
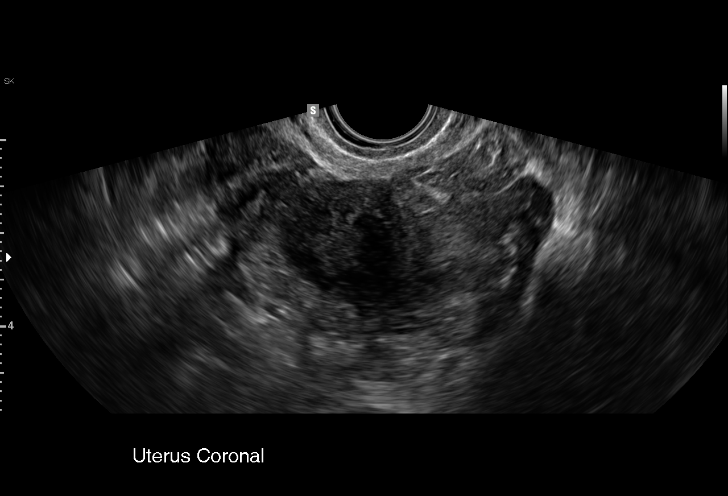
[im 33/53]
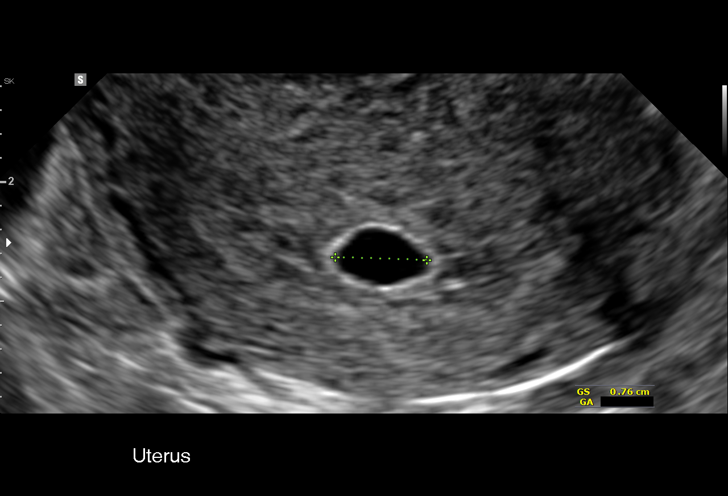
[im 37/53]
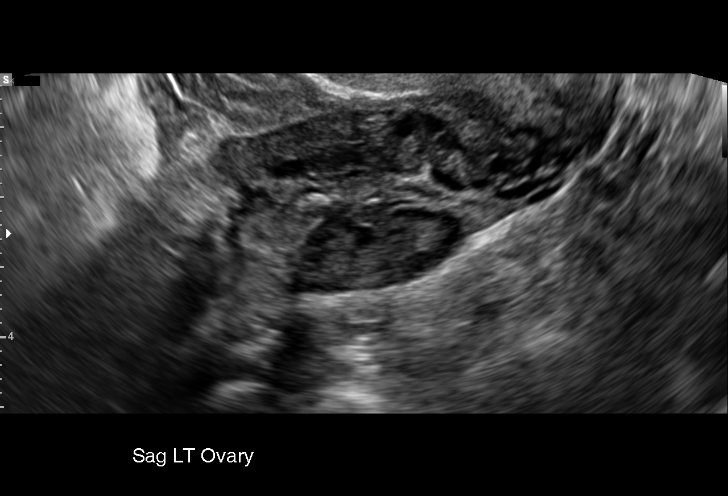
[im 41/53]
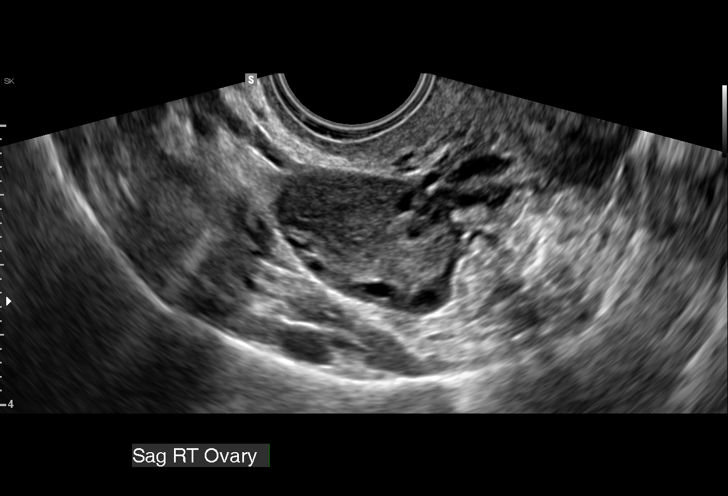
[im 45/53]
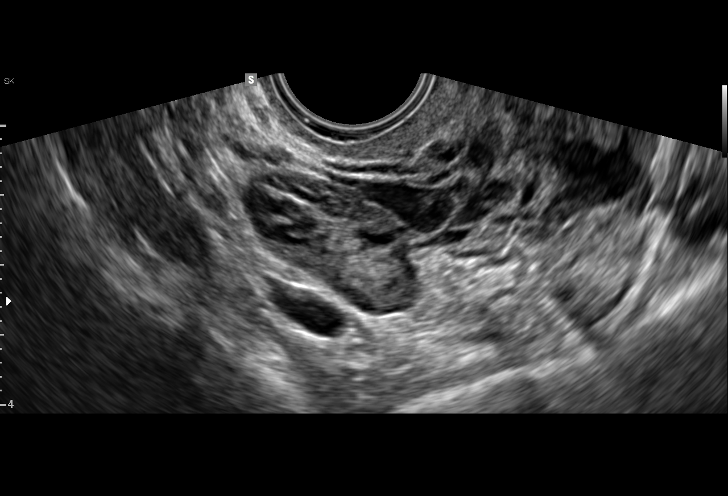
[im 49/53]
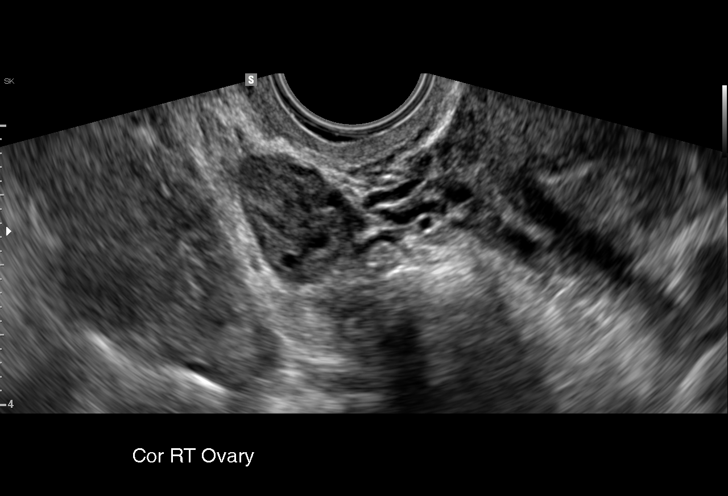
[im 53/53]
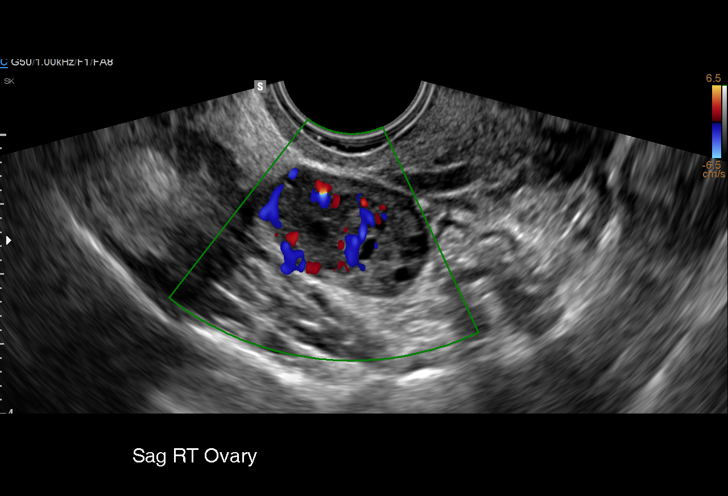

[15 of 28 positions shown; findings below may reference images not displayed]

FINDINGS: Intrauterine gestational sac: Single

Yolk sac:  Visible (image 34)

Embryo:  Not visible

MSD: 6.4  mm   5 w   2  d

Subchorionic hemorrhage:  None visualized.

Maternal uterus/adnexae: Normal left ovary, 2.7 x 1.4 x 80.9 cm.
Normal right ovary which appears to contain the corpus luteum (image
53). The right ovary measures 2.8 x 1.9 x 1.6 cm no pelvic free
fluid.
IMPRESSION: Early intrauterine gestational sac with yolk sac, but no fetal pole,
or cardiac activity yet visualized.

Recommend follow-up quantitative B-HCG levels and follow-up US in 14
days to confirm and assess viability. This recommendation follows
SRU consensus guidelines: Diagnostic Criteria for Nonviable
Pregnancy Early in the First Trimester. N Engl J Med 0508;
[DATE].

No acute maternal findings visualized.

## 2018-04-08 IMAGING — US US MFM OB TRANSVAGINAL
1 series · 15 of 25 positions shown · non-contrast
Comparison: none

[Series 1: us mfm ob transvaginal · 25 acquisitions, 15 frames shown]
[im 1/25]
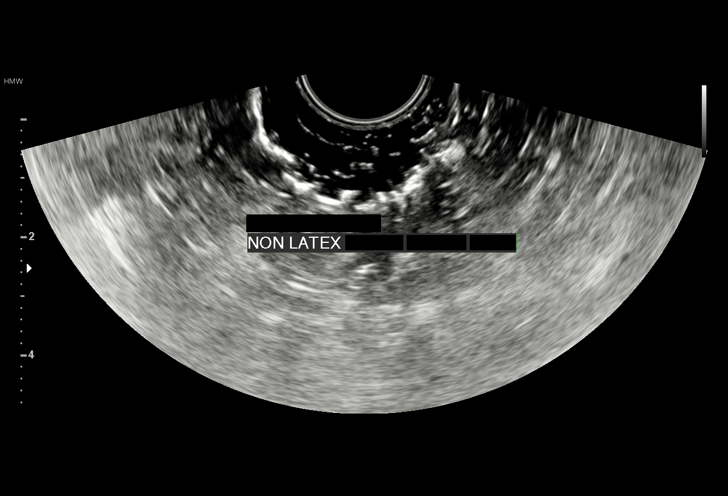
[im 3/25]
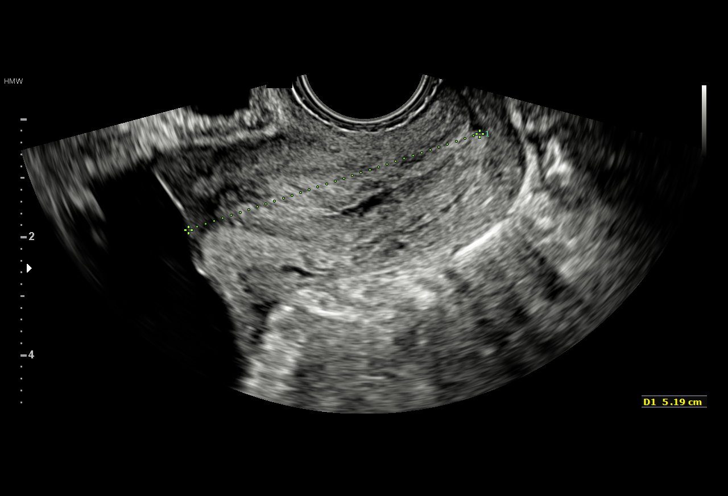
[im 5/25]
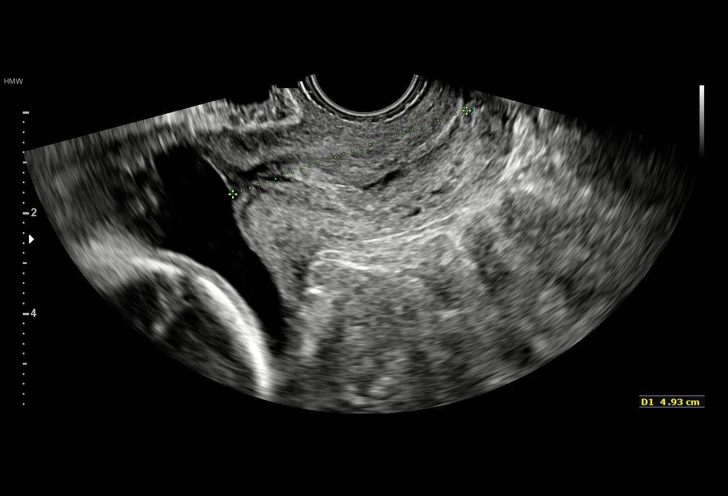
[im 6/25]
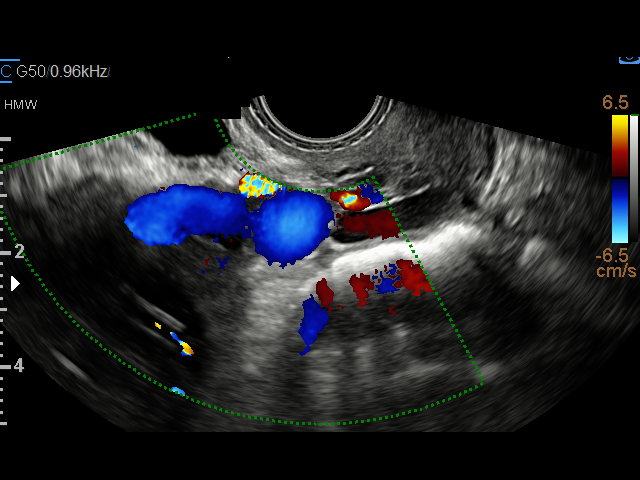
[im 8/25]
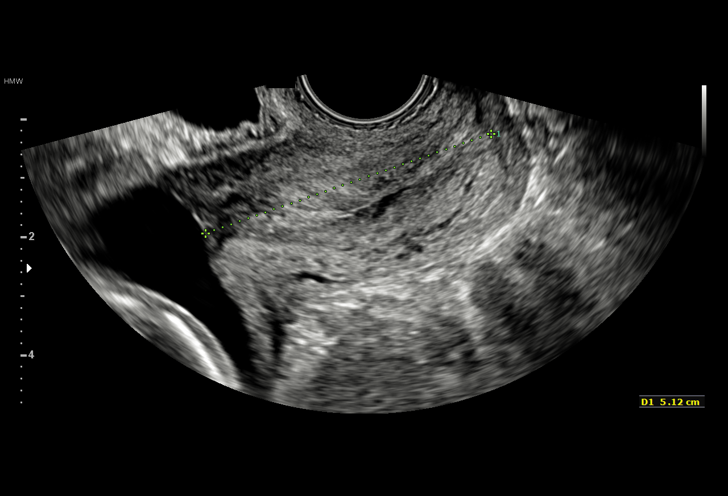
[im 10/25]
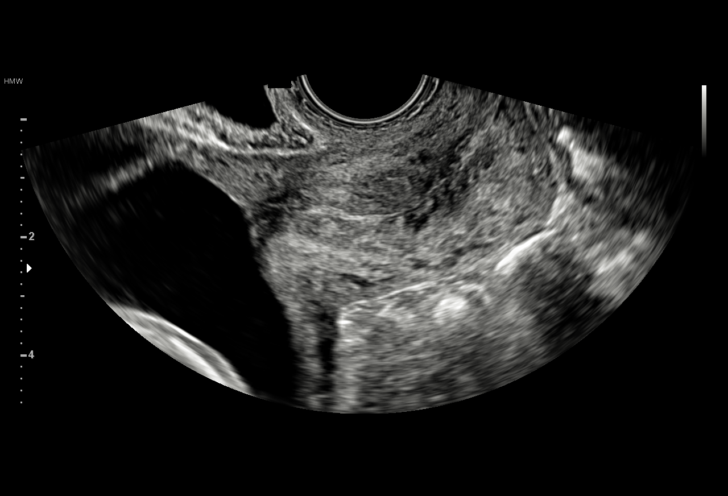
[im 11/25]
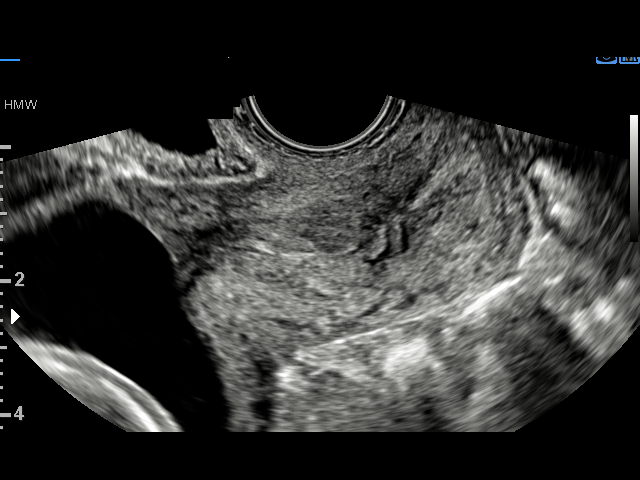
[im 13/25]
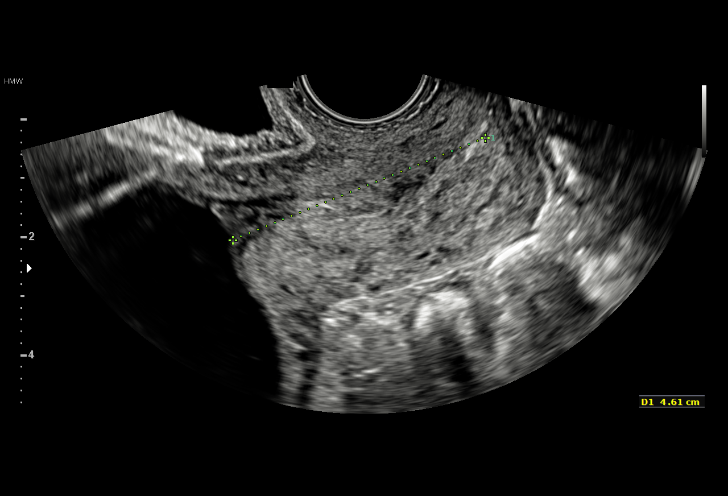
[im 15/25]
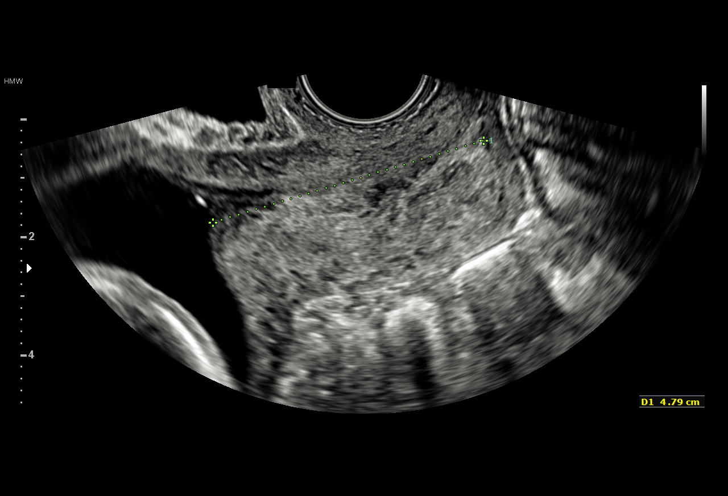
[im 16/25]
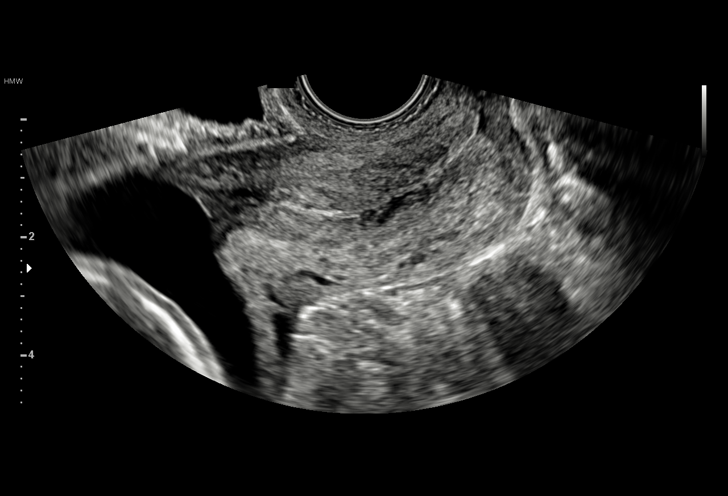
[im 18/25]
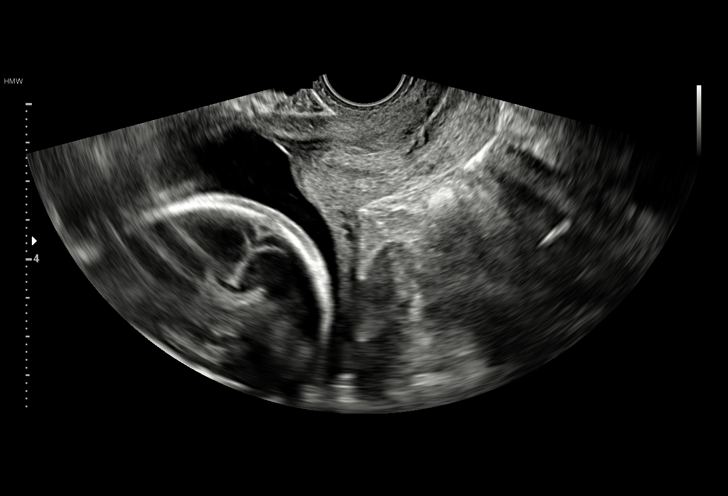
[im 20/25]
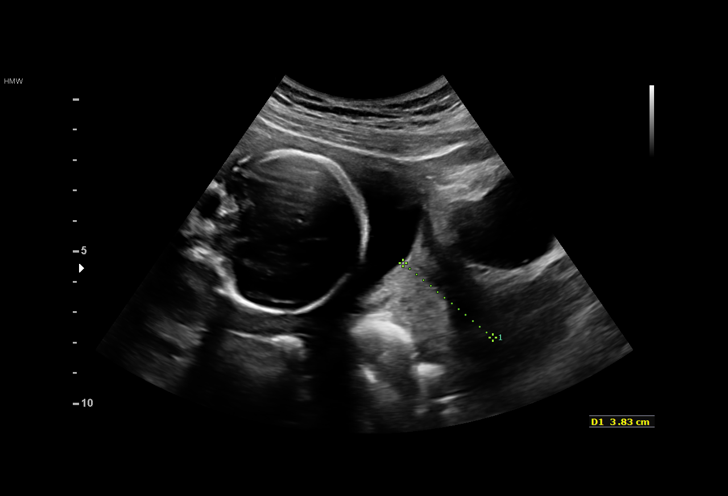
[im 21/25]
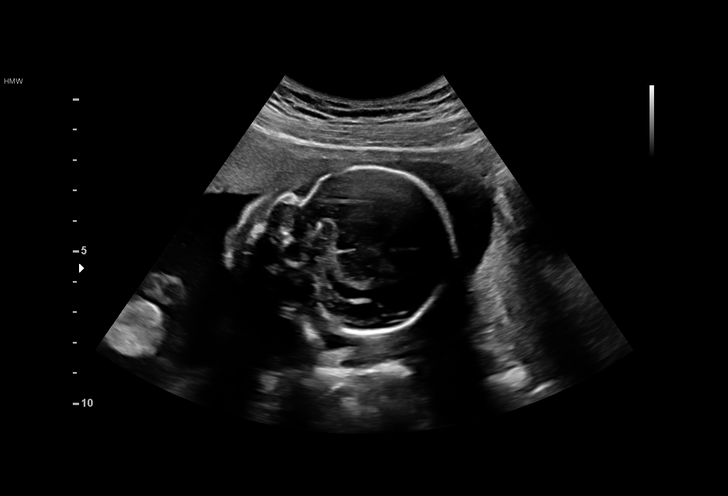
[im 23/25]
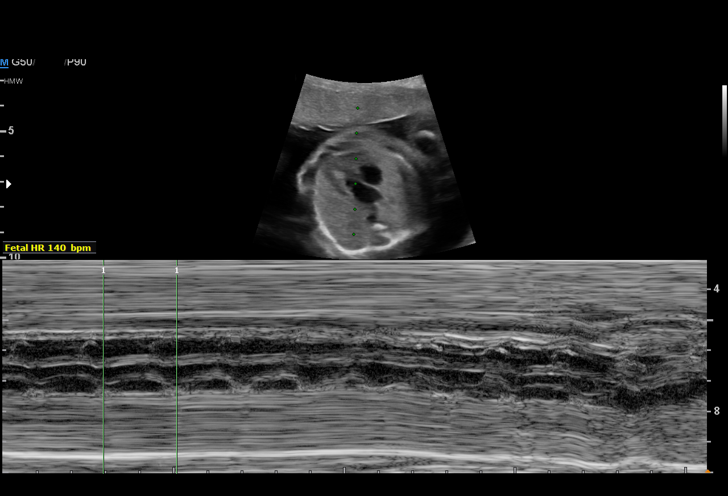
[im 25/25]
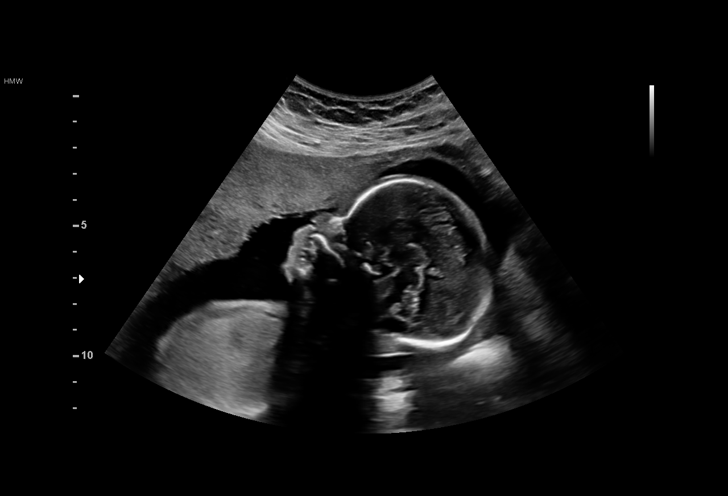

[15 of 25 positions shown; findings below may reference images not displayed]

Performed By:     Jean-Paul Cumberbatch          Secondary Phy.:   [id number]
HOLSTEIN/Triage

Indications

22 weeks gestation of pregnancy
Abdominal pain in pregnancy
Poor obstetric history: Previous preterm
delivery, antepartum x 2
Preterm contractions
OB History

Gravidity:    4         Prem:   2         SAB:   1
Living:       2
Fetal Evaluation

Num Of Fetuses:     1
Fetal Heart         140
Rate(bpm):
Cardiac Activity:   Observed
Presentation:       Cephalic
Gestational Age

LMP:           22w 2d       Date:   09/24/15                 EDD:   06/30/16
Best:          22w 2d    Det. By:   LMP  (09/24/15)          EDD:   06/30/16
Cervix Uterus Adnexa

Cervix
Length:            4.6  cm.
Normal appearance by transvaginal scan
Impression

SIUP at 22+2 weeks
Normal amniotic fluid volume
EV views of cervix: normal length without funneling
Placenta not evaluated
Recommendations

Follow-up ultrasounds as clinically indicated.

## 2018-04-27 ENCOUNTER — Other Ambulatory Visit: Payer: Self-pay | Admitting: Family Medicine

## 2018-04-27 DIAGNOSIS — N946 Dysmenorrhea, unspecified: Secondary | ICD-10-CM

## 2018-05-12 ENCOUNTER — Other Ambulatory Visit: Payer: Medicaid Other

## 2018-07-13 ENCOUNTER — Inpatient Hospital Stay (HOSPITAL_COMMUNITY)
Admission: AD | Admit: 2018-07-13 | Discharge: 2018-07-13 | Disposition: A | Discharge status: Home / Self Care | Payer: Medicaid Other | Attending: Obstetrics and Gynecology | Admitting: Obstetrics and Gynecology

## 2018-07-13 ENCOUNTER — Other Ambulatory Visit: Payer: Self-pay

## 2018-07-13 DIAGNOSIS — Z880 Allergy status to penicillin: Secondary | ICD-10-CM | POA: Insufficient documentation

## 2018-07-13 DIAGNOSIS — Z833 Family history of diabetes mellitus: Secondary | ICD-10-CM | POA: Insufficient documentation

## 2018-07-13 DIAGNOSIS — Z3202 Encounter for pregnancy test, result negative: Secondary | ICD-10-CM

## 2018-07-13 DIAGNOSIS — R102 Pelvic and perineal pain: Secondary | ICD-10-CM | POA: Insufficient documentation

## 2018-07-13 DIAGNOSIS — Z91040 Latex allergy status: Secondary | ICD-10-CM | POA: Insufficient documentation

## 2018-07-13 DIAGNOSIS — Z91010 Allergy to peanuts: Secondary | ICD-10-CM | POA: Insufficient documentation

## 2018-07-13 DIAGNOSIS — F319 Bipolar disorder, unspecified: Secondary | ICD-10-CM | POA: Insufficient documentation

## 2018-07-13 DIAGNOSIS — N912 Amenorrhea, unspecified: Secondary | ICD-10-CM | POA: Insufficient documentation

## 2018-07-13 DIAGNOSIS — Z9049 Acquired absence of other specified parts of digestive tract: Secondary | ICD-10-CM | POA: Insufficient documentation

## 2018-07-13 LAB — URINALYSIS, ROUTINE W REFLEX MICROSCOPIC
Bilirubin Urine: NEGATIVE
Glucose, UA: NEGATIVE mg/dL
Hgb urine dipstick: NEGATIVE
Ketones, ur: NEGATIVE mg/dL
Leukocytes,Ua: NEGATIVE
Nitrite: NEGATIVE
Protein, ur: NEGATIVE mg/dL
Specific Gravity, Urine: 1.014 (ref 1.005–1.030)
pH: 6 (ref 5.0–8.0)

## 2018-07-13 LAB — POCT PREGNANCY, URINE: Preg Test, Ur: NEGATIVE

## 2018-07-13 LAB — HCG, QUANTITATIVE, PREGNANCY: hCG, Beta Chain, Quant, S: <1 m[IU]/mL (ref <5)

## 2018-07-13 NOTE — MAU Note (Signed)
In to discharge patient. Pt not in triage or lobby.

## 2018-07-13 NOTE — Discharge Instructions (Signed)
Pelvic Pain, Female  Pelvic pain is pain in your lower belly (abdomen), below your belly button and between your hips. The pain may start suddenly (be acute), keep coming back (be recurring), or last a long time (become chronic). Pelvic pain that lasts longer than 6 months is called chronic pelvic pain. There are many causes of pelvic pain. Sometimes the cause of pelvic pain is not known.  Follow these instructions at home:    · Take over-the-counter and prescription medicines only as told by your doctor.  · Rest as told by your doctor.  · Do not have sex if it hurts.  · Keep a journal of your pelvic pain. Write down:  ? When the pain started.  ? Where the pain is located.  ? What seems to make the pain better or worse, such as food or your period (menstrual cycle).  ? Any symptoms you have along with the pain.  · Keep all follow-up visits as told by your doctor. This is important.  Contact a doctor if:  · Medicine does not help your pain.  · Your pain comes back.  · You have new symptoms.  · You have unusual discharge or bleeding from your vagina.  · You have a fever or chills.  · You are having trouble pooping (constipation).  · You have blood in your pee (urine) or poop (stool).  · Your pee smells bad.  · You feel weak or light-headed.  Get help right away if:  · You have sudden pain that is very bad.  · Your pain keeps getting worse.  · You have very bad pain and also have any of these symptoms:  ? A fever.  ? Feeling sick to your stomach (nausea).  ? Throwing up (vomiting).  ? Being very sweaty.  · You pass out (lose consciousness).  Summary  · Pelvic pain is pain in your lower belly (abdomen), below your belly button and between your hips.  · There are many possible causes of pelvic pain.  · Keep a journal of your pelvic pain.  This information is not intended to replace advice given to you by your health care provider. Make sure you discuss any questions you have with your health care provider.  Document  Released: 09/02/2007 Document Revised: 09/01/2017 Document Reviewed: 09/01/2017  Elsevier Interactive Patient Education © 2019 [employer]

## 2018-07-13 NOTE — MAU Provider Note (Addendum)
Chief Complaint:  Possible Pregnancy and Pelvic Pain   First Provider Initiated Contact with Patient 07/13/18 2235     MSE:   Sharon Townsend is a 28 y.o. [id number] who presents to maternity admissions reporting positive urine pregnancy test result at home.  States it was positive after 5 minutes.  Has not had a period since February. History of BTL.  Marland Kitchen She reports vaginal bleeding, vaginal itching/burning, urinary symptoms, h/a, dizziness, n/v, or fever/chills.    RN Note: Pt reports she had a BTL 2 years ago. Reports she had a +upt at home about 1.5 weeks ago. Reports pelvic pain for about a week now. Pt denies vaginal bleeding. Reports thick, white discharge. Denies itching or odor. LMP: 05/19/18.  Past Medical History: Past Medical History:  Diagnosis Date  . Bipolar disorder (HCC)   . Chronic constipation   . Gall bladder stones   . Headache   . Renal disorder    two tubes in left kidney going to bladder    Past obstetric history: OB History  Gravida Para Term Preterm AB Living  4 3 1 2 1 2   SAB TAB Ectopic Multiple Live Births  1 0 0 0 2    # Outcome Date GA Lbr Len/2nd Weight Sex Delivery Anes PTL Lv  4 Term 06/14/16 [redacted]w[redacted]d  3250 g F CS-LTranv EPI  LIV  3 Preterm 08/03/14 [redacted]w[redacted]d 09:30 / 00:21 2016 g M Vag-Spont EPI  LIV     Birth Comments: cleft lip  2 SAB 2011 [redacted]w[redacted]d         1 Preterm 2009 [redacted]w[redacted]d   F Vag-Spont  N FD    Past Surgical History: Past Surgical History:  Procedure Laterality Date  . CESAREAN SECTION N/A 06/14/2016   Procedure: CESAREAN SECTION;  Surgeon: Philip Aspen, DO;  Location: [facility] BIRTHING SUITES;  Service: Obstetrics;  Laterality: N/A;  . CHOLECYSTECTOMY    . MANDIBLE SURGERY    . renal stents    . TONSILLECTOMY      Family History: Family History  Problem Relation Age of Onset  . Diabetes Mother   . Diabetes Father   . Hypertension Father     Social History: Social History   Tobacco Use  . Smoking status: Never Smoker  . Smokeless  tobacco: Never Used  Substance Use Topics  . Alcohol use: No    Comment: before preg  . Drug use: No    Allergies:  Allergies  Allergen Reactions  . Peanuts [Peanut Oil] Anaphylaxis  . Latex Swelling  . Penicillins Other (See Comments)    Reaction:  Pt states that it "shuts down her bowels and cannot urinate" Has patient had a PCN reaction causing immediate rash, facial/tongue/throat swelling, SOB or lightheadedness with hypotension: No Has patient had a PCN reaction causing severe rash involving mucus membranes or skin necrosis: No Has patient had a PCN reaction that required hospitalization No Has patient had a PCN reaction occurring within the last 10 years: No If all of the above answers are "NO", then may proceed with Cephalosp       Physical Exam   Patient Vitals for the past 24 hrs:  BP Temp Temp src Pulse Resp SpO2 Height Weight  07/13/18 2119 (!) 142/82 98.9 F (37.2 C) Oral 86 18 98 % 5\' 4"  (1.626 m) 90 kg   Constitutional: Well-developed, well-nourished female in no acute distress.  Cardiovascular: normal rate and rhythm,  Respiratory: normal effort, no distress. Neurologic: Alert and oriented  x 4.   Grossly nonfocal. Skin:  Warm and Dry Psych:  Affect appropriate.   Labs: Results for orders placed or performed during the hospital encounter of 07/13/18 (from the past 24 hour(s))  Urinalysis, Routine w reflex microscopic     Status: None   Collection Time: 07/13/18  8:56 PM  Result Value Ref Range   Color, Urine YELLOW YELLOW   APPearance CLEAR CLEAR   Specific Gravity, Urine 1.014 1.005 - 1.030   pH 6.0 5.0 - 8.0   Glucose, UA NEGATIVE NEGATIVE mg/dL   Hgb urine dipstick NEGATIVE NEGATIVE   Bilirubin Urine NEGATIVE NEGATIVE   Ketones, ur NEGATIVE NEGATIVE mg/dL   Protein, ur NEGATIVE NEGATIVE mg/dL   Nitrite NEGATIVE NEGATIVE   Leukocytes,Ua NEGATIVE NEGATIVE  Pregnancy, urine POC     Status: None   Collection Time: 07/13/18  9:07 PM  Result Value Ref  Range   Preg Test, Ur NEGATIVE NEGATIVE  hCG, quantitative, pregnancy     Status: None   Collection Time: 07/13/18  9:32 PM  Result Value Ref Range   hCG, Beta Chain, Quant, S <1 <5 mIU/mL     Assessment: 1. Pelvic pain   2. Pregnancy test negative     Medical Screen Exam Complete Quantitative HCG done since patient stated her home test was positive  A: Amenorrhea 1. Pelvic pain   2. Pregnancy test negative      P: Discharge from MAU Reviewed policy that we don't treat nonpregnant patients here Patient advised to follow-up with her OB/GYN doctor in the morning to arrange evaluation. Handout given with other options for followup   [facility]  Follow-up Information    [facility].   Contact information: [address] [place] [address] [phone]           Encouraged to return here or to other Urgent Care/ED if she develops worsening of symptoms, increase in pain, fever, or other concerning symptoms.   Wynelle BourgeoisMarie Bernadene Garside CNM, MSN Certified Nurse-Midwife 07/14/2018 2240 hrs

## 2018-07-13 NOTE — MAU Note (Signed)
Pt reports she had a BTL 2 years ago. Reports she had a +upt at home about 1.5 weeks ago. Reports pelvic pain for about a week now. Pt denies vaginal bleeding. Reports thick, white discharge. Denies itching or odor. LMP: 05/19/18.

## 2018-07-15 DIAGNOSIS — R102 Pelvic and perineal pain: Secondary | ICD-10-CM | POA: Insufficient documentation

## 2019-01-01 ENCOUNTER — Other Ambulatory Visit: Payer: Self-pay

## 2019-01-01 ENCOUNTER — Ambulatory Visit (HOSPITAL_COMMUNITY)
Admission: EM | Admit: 2019-01-01 | Discharge: 2019-01-01 | Disposition: A | Discharge status: Home / Self Care | Payer: BLUE CROSS/BLUE SHIELD | Attending: Family Medicine | Admitting: Family Medicine

## 2019-01-01 ENCOUNTER — Ambulatory Visit (INDEPENDENT_AMBULATORY_CARE_PROVIDER_SITE_OTHER): Payer: BLUE CROSS/BLUE SHIELD

## 2019-01-01 ENCOUNTER — Encounter (HOSPITAL_COMMUNITY): Payer: Self-pay

## 2019-01-01 DIAGNOSIS — R05 Cough: Secondary | ICD-10-CM | POA: Diagnosis not present

## 2019-01-01 DIAGNOSIS — R432 Parageusia: Secondary | ICD-10-CM | POA: Insufficient documentation

## 2019-01-01 DIAGNOSIS — R509 Fever, unspecified: Secondary | ICD-10-CM | POA: Diagnosis present

## 2019-01-01 DIAGNOSIS — J3489 Other specified disorders of nose and nasal sinuses: Secondary | ICD-10-CM

## 2019-01-01 DIAGNOSIS — Z20828 Contact with and (suspected) exposure to other viral communicable diseases: Secondary | ICD-10-CM

## 2019-01-01 DIAGNOSIS — R5383 Other fatigue: Secondary | ICD-10-CM

## 2019-01-01 DIAGNOSIS — R0981 Nasal congestion: Secondary | ICD-10-CM | POA: Diagnosis not present

## 2019-01-01 DIAGNOSIS — R0789 Other chest pain: Secondary | ICD-10-CM

## 2019-01-01 LAB — POCT RAPID STREP A: Streptococcus, Group A Screen (Direct): NEGATIVE

## 2019-01-01 NOTE — Discharge Instructions (Addendum)
Drink plenty of water Get plenty of rest. Take ibuprofen or tylenol as needed for pain. Check mychart for lab results, results typically take 2-3 days. Recommend self-isolation as discussed.

## 2019-01-01 NOTE — ED Provider Notes (Signed)
[facility]    CSN: [id number] Arrival date & time: 01/01/19  1304      History   Chief Complaint Chief Complaint  Patient presents with  . Fever    HPI Sharon Townsend is a 28 y.o. female.   Patient here concerned with "covid19 infection" x 7 days.  She admits fatigue, fever, chills (tmax 103, 3 days ago), nasal congestion, rhinorrhea, loss of taste, cough (productive, yellow/green), chest tightness "similar to when I had pneumonia."  Denies nausea, vomiting, diarrhea, abdominal pain, SOB, wheezing. Last dose 1000 mg tylenol 2 hours ago.  No known exposures to COVID19, though was at a birthday party for family 2 weeks ago, very few guests wore masks, social distancing was not practiced.  Patient reports 1 other guest to the party has similar sx, but they refuse to get COVID tested.     Past Medical History:  Diagnosis Date  . Bipolar disorder (HCC)   . Chronic constipation   . Gall bladder stones   . Headache   . Renal disorder    two tubes in left kidney going to bladder    Patient Active Problem List   Diagnosis Date Noted  . Gestational hypertension w/o significant proteinuria in 3rd trimester 06/11/2016  . Preeclampsia, third trimester 06/09/2016  . False labor before 37 completed weeks of gestation during pregnancy in third trimester, antepartum 06/07/2016  . Chronic cholecystitis with calculus 10/03/2014    Past Surgical History:  Procedure Laterality Date  . CESAREAN SECTION N/A 06/14/2016   Procedure: CESAREAN SECTION;  Surgeon: Philip AspenSidney Callahan, DO;  [facility];  Service: Obstetrics;  Laterality: N/A;  . CHOLECYSTECTOMY    . MANDIBLE SURGERY    . renal stents    . TONSILLECTOMY      OB History    Gravida  4   Para  3   Term  1   Preterm  2   AB  1   Living  2     SAB  1   TAB  0   Ectopic  0   Multiple  0   Live Births  2            Home Medications    Prior to Admission medications    Medication Sig Start Date End Date Taking? Authorizing Provider  ibuprofen (ADVIL,MOTRIN) 600 MG tablet Take 1 tablet (600 mg total) by mouth every 6 (six) hours. 06/16/16   Levi AlandAnderson, Mark E, MD  ibuprofen (ADVIL,MOTRIN) 600 MG tablet Take 1 tablet (600 mg total) by mouth every 6 (six) hours as needed. 04/06/17   Felicie MornSmith, David, NP  oxyCODONE-acetaminophen (PERCOCET/ROXICET) 5-325 MG tablet Take 1 tablet by mouth every 4 (four) hours as needed (pain scale 4-7). 06/16/16   Levi AlandAnderson, Mark E, MD  Prenatal Vit-Fe Fumarate-FA (PRENATAL MULTIVITAMIN) TABS tablet Take 1 tablet by mouth daily at 12 noon.    [provider]  traMADol (ULTRAM) 50 MG tablet Take 1 tablet (50 mg total) by mouth every 6 (six) hours as needed. 08/04/16   Allie Bossierove, Myra C, MD    Family History Family History  Problem Relation Age of Onset  . Diabetes Mother   . Diabetes Father   . Hypertension Father     Social History Social History   Tobacco Use  . Smoking status: Never Smoker  . Smokeless tobacco: Never Used  Substance Use Topics  . Alcohol use: No    Comment: before preg  . Drug use:  No     Allergies   Peanuts [peanut oil], Latex, and Penicillins   Review of Systems Review of Systems  Constitutional: Positive for appetite change, chills, fatigue and fever.  HENT: Positive for congestion, rhinorrhea and sore throat. Negative for ear discharge, ear pain, hearing loss, mouth sores, sneezing and trouble swallowing.   Eyes: Negative for pain and visual disturbance.  Respiratory: Positive for cough and chest tightness. Negative for choking, shortness of breath and wheezing.   Cardiovascular: Negative for chest pain and palpitations.  Gastrointestinal: Positive for nausea. Negative for abdominal pain, diarrhea and vomiting.  Genitourinary: Negative for dysuria and hematuria.  Musculoskeletal: Positive for myalgias. Negative for arthralgias, back pain and gait problem.  Skin: Negative for color change and rash.   Allergic/Immunologic: Negative for immunocompromised state.  Neurological: Positive for headaches. Negative for seizures, syncope and weakness.  Hematological: Negative for adenopathy.  Psychiatric/Behavioral: Negative for dysphoric mood and sleep disturbance.  All other systems reviewed and are negative.    Physical Exam Triage Vital Signs ED Triage Vitals  Enc Vitals Group     BP 01/01/19 1407 123/80     Pulse Rate 01/01/19 1407 98     Resp 01/01/19 1407 18     Temp 01/01/19 1407 97.8 F (36.6 C)     Temp src --      SpO2 01/01/19 1407 100 %     Weight 01/01/19 1406 180 lb (81.6 kg)     Height --      Head Circumference --      Peak Flow --      Pain Score 01/01/19 1406 8     Pain Loc --      Pain Edu? --      Excl. in GC? --    No data found.  Updated Vital Signs BP 123/80 (BP Location: Right Arm)   Pulse 98   Temp 97.8 F (36.6 C)   Resp 18   Wt 180 lb (81.6 kg)   LMP 12/25/2018   SpO2 100%   BMI 30.90 kg/m   Visual Acuity Right Eye Distance:   Left Eye Distance:   Bilateral Distance:    Right Eye Near:   Left Eye Near:    Bilateral Near:     Physical Exam Vitals signs and nursing note reviewed.  Constitutional:      General: She is not in acute distress.    Appearance: Normal appearance. She is well-developed.  HENT:     Head: Normocephalic and atraumatic.     Right Ear: Tympanic membrane, ear canal and external ear normal. There is no impacted cerumen.     Left Ear: Tympanic membrane, ear canal and external ear normal. There is no impacted cerumen.     Nose: Mucosal edema and rhinorrhea present. No nasal tenderness or congestion.     Right Nostril: No occlusion.     Left Nostril: No occlusion.     Right Turbinates: Not enlarged or swollen.     Left Turbinates: Not enlarged or swollen.     Right Sinus: No maxillary sinus tenderness or frontal sinus tenderness.     Left Sinus: No maxillary sinus tenderness or frontal sinus tenderness.      Comments: turbinates erythematous Eyes:     Conjunctiva/sclera: Conjunctivae normal.  Neck:     Musculoskeletal: Neck supple.  Cardiovascular:     Rate and Rhythm: Normal rate and regular rhythm.     Heart sounds: Normal heart sounds. No murmur.  Pulmonary:     Effort: Pulmonary effort is normal. No respiratory distress.     Breath sounds: Normal breath sounds. No wheezing, rhonchi or rales.  Chest:     Chest wall: No tenderness.  Abdominal:     Palpations: Abdomen is soft.     Tenderness: There is no abdominal tenderness.  Skin:    General: Skin is warm and dry.     Capillary Refill: Capillary refill takes less than 2 seconds.  Neurological:     General: No focal deficit present.     Mental Status: She is alert and oriented to person, place, and time.  Psychiatric:        Mood and Affect: Mood normal.        Behavior: Behavior normal.      UC Treatments / Results  Labs (all labs ordered are listed, but only abnormal results are displayed) Labs Reviewed  NOVEL CORONAVIRUS, NAA (HOSP ORDER, SEND-OUT TO REF LAB; TAT 18-24 HRS)  POCT RAPID STREP A    EKG   Radiology Dg Chest 2 View  Result Date: 01/01/2019 CLINICAL DATA:  Fever, productive cough EXAM: CHEST - 2 VIEW COMPARISON:  10/28/2013 FINDINGS: The heart size and mediastinal contours are within normal limits. Both lungs are clear. The visualized skeletal structures are unremarkable. IMPRESSION: No acute abnormality of the lungs. Electronically Signed   By: Eddie Candle M.D.   On: 01/01/2019 15:23    Procedures Procedures (including critical care time)  Medications Ordered in UC Medications - No data to display  Initial Impression / Assessment and Plan / UC Course  I have reviewed the triage vital signs and the nursing notes.  Pertinent labs & imaging results that were available during my care of the patient were reviewed by me and considered in my medical decision making (see chart for details).       Final Clinical Impressions(s) / UC Diagnoses   Final diagnoses:  Exposure to SARS-associated coronavirus  Loss of taste  Fever, unspecified     Discharge Instructions     Drink plenty of water Get plenty of rest. Take ibuprofen or tylenol as needed for pain. Check mychart for lab results, results typically take 2-3 days. Recommend self-isolation as discussed.    ED Prescriptions    None     PDMP not reviewed this encounter.   Peri Jefferson, PA-C 01/01/19 1534

## 2019-01-01 NOTE — ED Triage Notes (Signed)
Pt states has had a fever, sore throat, and cough x 5 days.

## 2019-01-02 ENCOUNTER — Encounter (HOSPITAL_COMMUNITY): Payer: Self-pay

## 2019-01-02 LAB — NOVEL CORONAVIRUS, NAA (HOSP ORDER, SEND-OUT TO REF LAB; TAT 18-24 HRS): SARS-CoV-2, NAA: NOT DETECTED

## 2019-04-24 ENCOUNTER — Other Ambulatory Visit: Payer: Self-pay

## 2019-04-24 ENCOUNTER — Encounter (HOSPITAL_COMMUNITY): Payer: Self-pay

## 2019-04-24 ENCOUNTER — Ambulatory Visit (HOSPITAL_COMMUNITY)
Admission: EM | Admit: 2019-04-24 | Discharge: 2019-04-24 | Disposition: A | Discharge status: Home / Self Care | Payer: Medicaid Other | Attending: Internal Medicine | Admitting: Internal Medicine

## 2019-04-24 DIAGNOSIS — R6889 Other general symptoms and signs: Secondary | ICD-10-CM | POA: Diagnosis not present

## 2019-04-24 DIAGNOSIS — Z20822 Contact with and (suspected) exposure to covid-19: Secondary | ICD-10-CM | POA: Insufficient documentation

## 2019-04-24 DIAGNOSIS — R52 Pain, unspecified: Secondary | ICD-10-CM

## 2019-04-24 DIAGNOSIS — R0602 Shortness of breath: Secondary | ICD-10-CM

## 2019-04-24 DIAGNOSIS — R519 Headache, unspecified: Secondary | ICD-10-CM

## 2019-04-24 NOTE — ED Triage Notes (Signed)
Pt presents to UC with headaches, body aches, SOB and loss of taste x 2 days.

## 2019-04-25 NOTE — ED Provider Notes (Signed)
[facility]    CSN: [id number] Arrival date & time: 04/24/19  1924      History   Chief Complaint Chief Complaint  Patient presents with  . Headache  . Shortness of Breath  . Generalized Body Aches    HPI Sharon Townsend is a 29 y.o. female with bipolar disorder comes to urgent care with complaints of headache, generalized body aches, shortness of breath and constant patient exposed to Covid individual when she took her son to a therapist.   He denies any fever or chills.  No nausea or vomiting.  No abdominal pain. No diarrhea.  HPI  Past Medical History:  Diagnosis Date  . Bipolar disorder ([place])   . Chronic constipation   . Gall bladder stones   . Headache   . Renal disorder    two tubes in left kidney going to bladder    Patient Active Problem List   Diagnosis Date Noted  . Gestational hypertension w/o significant proteinuria in 3rd trimester 06/11/2016  . Preeclampsia, third trimester 06/09/2016  . False labor before 37 completed weeks of gestation during pregnancy in third trimester, antepartum 06/07/2016  . Chronic cholecystitis with calculus 10/03/2014    Past Surgical History:  Procedure Laterality Date  . CESAREAN SECTION N/A 06/14/2016   Procedure: CESAREAN SECTION;  Surgeon: Allyn Kenner, DO;  Location: [facility];  Service: Obstetrics;  Laterality: N/A;  . CHOLECYSTECTOMY    . MANDIBLE SURGERY    . renal stents    . TONSILLECTOMY      OB History    Gravida  4   Para  3   Term  1   Preterm  2   AB  1   Living  2     SAB  1   TAB  0   Ectopic  0   Multiple  0   Live Births  2            Home Medications    Prior to Admission medications   Medication Sig Start Date End Date Taking? Authorizing Provider  ibuprofen (ADVIL,MOTRIN) 600 MG tablet Take 1 tablet (600 mg total) by mouth every 6 (six) hours. 06/16/16   Olga Millers, MD  ibuprofen (ADVIL,MOTRIN) 600 MG tablet Take 1 tablet (600 mg total) by  mouth every 6 (six) hours as needed. 04/06/17   Etta Quill, NP  oxyCODONE-acetaminophen (PERCOCET/ROXICET) 5-325 MG tablet Take 1 tablet by mouth every 4 (four) hours as needed (pain scale 4-7). 06/16/16   Olga Millers, MD  Prenatal Vit-Fe Fumarate-FA (PRENATAL MULTIVITAMIN) TABS tablet Take 1 tablet by mouth daily at 12 noon.    [provider]  traMADol (ULTRAM) 50 MG tablet Take 1 tablet (50 mg total) by mouth every 6 (six) hours as needed. 08/04/16   Emily Filbert, MD    Family History Family History  Problem Relation Age of Onset  . Diabetes Mother   . Diabetes Father   . Hypertension Father     Social History Social History   Tobacco Use  . Smoking status: Never Smoker  . Smokeless tobacco: Never Used  Substance Use Topics  . Alcohol use: No    Comment: before preg  . Drug use: No     Allergies   Peanuts [peanut oil], Latex, and Penicillins   Review of Systems Review of Systems  Constitutional: Positive for activity change and fatigue. Negative for chills and fever.  HENT: Negative for congestion, rhinorrhea, sinus  pressure and sinus pain.   Respiratory: Positive for chest tightness and shortness of breath. Negative for cough and wheezing.   Cardiovascular: Negative for chest pain and palpitations.  Gastrointestinal: Negative for abdominal pain, diarrhea, nausea and vomiting.  Genitourinary: Negative.   Musculoskeletal: Positive for arthralgias and myalgias. Negative for joint swelling.  Skin: Negative for pallor, rash and wound.  Neurological: Negative for dizziness, weakness, light-headedness and numbness.  Psychiatric/Behavioral: Negative for confusion and decreased concentration.     Physical Exam Triage Vital Signs ED Triage Vitals  Enc Vitals Group     BP 04/24/19 2003 122/78     Pulse Rate 04/24/19 2003 98     Resp 04/24/19 2003 17     Temp 04/24/19 2003 98.8 F (37.1 C)     Temp Source 04/24/19 2003 Oral     SpO2 04/24/19 2003 99 %      Weight --      Height --      Head Circumference --      Peak Flow --      Pain Score 04/24/19 2002 8     Pain Loc --      Pain Edu? --      Excl. in GC? --    No data found.  Updated Vital Signs BP 122/78 (BP Location: Left Arm)   Pulse 98   Temp 98.8 F (37.1 C) (Oral)   Resp 17   LMP  (Within Weeks) Comment: 1 week  SpO2 99%   Visual Acuity Right Eye Distance:   Left Eye Distance:   Bilateral Distance:    Right Eye Near:   Left Eye Near:    Bilateral Near:     Physical Exam Vitals and nursing note reviewed.  Constitutional:      General: She is not in acute distress.    Appearance: She is well-developed. She is ill-appearing.  HENT:     Mouth/Throat:     Mouth: Mucous membranes are moist.  Eyes:     Extraocular Movements:     Right eye: Normal extraocular motion.     Left eye: Normal extraocular motion.  Cardiovascular:     Rate and Rhythm: Normal rate and regular rhythm.     Heart sounds: Normal heart sounds. No murmur. No gallop.   Pulmonary:     Effort: Pulmonary effort is normal. No respiratory distress.     Breath sounds: No stridor. No wheezing or rhonchi.  Abdominal:     General: Bowel sounds are normal. There is no distension.     Palpations: Abdomen is soft. There is no mass.  Musculoskeletal:        General: No swelling or tenderness. Normal range of motion.     Cervical back: Normal range of motion and neck supple.  Skin:    Capillary Refill: Capillary refill takes less than 2 seconds.  Neurological:     Mental Status: She is alert and oriented to person, place, and time.     GCS: GCS eye subscore is 4. GCS verbal subscore is 5. GCS motor subscore is 6.     Cranial Nerves: No cranial nerve deficit or dysarthria.     Sensory: No sensory deficit.     Motor: No weakness.      UC Treatments / Results  Labs (all labs ordered are listed, but only abnormal results are displayed) Labs Reviewed  NOVEL CORONAVIRUS, NAA (HOSP ORDER, SEND-OUT TO  REF LAB; TAT 18-24 HRS)  POC SARS CORONAVIRUS 2  AG -  ED  POC SARS CORONAVIRUS 2 AG    EKG   Radiology No results found.  Procedures Procedures (including critical care time)  Medications Ordered in UC Medications - No data to display  Initial Impression / Assessment and Plan / UC Course  I have reviewed the triage vital signs and the nursing notes.  Pertinent labs & imaging results that were available during my care of the patient were reviewed by me and considered in my medical decision making (see chart for details).     1.Flu-like illness in the setting of close exposure to covid-19 virus:  COVID-19 PCR test sent Rapid Covid test is negative intake Tylenol as needed for body aches Patient is advised to self-isolate until COVID-19 test results. If patient experiences worsening symptoms she is advised to return to urgent care.  Final Clinical Impressions(s) / UC Diagnoses   Final diagnoses:  Flu-like symptoms  Close exposure to COVID-19 virus   Discharge Instructions   None    ED Prescriptions    None     PDMP not reviewed this encounter.   Merrilee Jansky, MD 04/25/19 2209

## 2019-04-27 LAB — NOVEL CORONAVIRUS, NAA (HOSP ORDER, SEND-OUT TO REF LAB; TAT 18-24 HRS): SARS-CoV-2, NAA: NOT DETECTED

## 2019-06-01 ENCOUNTER — Encounter (HOSPITAL_COMMUNITY): Payer: Self-pay | Admitting: Emergency Medicine

## 2019-06-01 ENCOUNTER — Ambulatory Visit (HOSPITAL_COMMUNITY)
Admission: EM | Admit: 2019-06-01 | Discharge: 2019-06-01 | Disposition: A | Discharge status: Home / Self Care | Payer: 59

## 2019-06-01 ENCOUNTER — Ambulatory Visit (INDEPENDENT_AMBULATORY_CARE_PROVIDER_SITE_OTHER): Payer: 59

## 2019-06-01 ENCOUNTER — Other Ambulatory Visit: Payer: Self-pay

## 2019-06-01 DIAGNOSIS — W19XXXA Unspecified fall, initial encounter: Secondary | ICD-10-CM | POA: Diagnosis not present

## 2019-06-01 DIAGNOSIS — W548XXA Other contact with dog, initial encounter: Secondary | ICD-10-CM | POA: Diagnosis not present

## 2019-06-01 DIAGNOSIS — M545 Low back pain, unspecified: Secondary | ICD-10-CM

## 2019-06-01 DIAGNOSIS — S8011XA Contusion of right lower leg, initial encounter: Secondary | ICD-10-CM | POA: Diagnosis not present

## 2019-06-01 MED ORDER — CYCLOBENZAPRINE HCL 10 MG PO TABS: 10.0000 mg | ORAL_TABLET | Freq: Two times a day (BID) | ORAL | 0 refills | Status: DC | PRN

## 2019-06-01 MED ORDER — IBUPROFEN 600 MG PO TABS: 600.0000 mg | ORAL_TABLET | Freq: Three times a day (TID) | ORAL | 0 refills | Status: AC

## 2019-06-01 NOTE — ED Provider Notes (Signed)
[facility]    CSN: [id number] Arrival date & time: 06/01/19  1006      History   Chief Complaint Chief Complaint  Patient presents with  . Fall    HPI Sharon Townsend is a 29 y.o. female.   Patient reports urgent care today for right leg pain and back pain.  She reports pain started yesterday after being pulled to the ground by her large dog.  She reports a dog was in a fight and she grabbed his collar and she fell to the ground was drug a little bit on a gravel area.  She reports pain on the backside inside of her right leg.  She also reports pain in her lower back.  She reports specifically there is pain on the right side in the middle of her lower back.  Her leg pain is 7 out of 10 and her back pain is 8 out of 10.  She reports a change in her gait and pain with walking.  She does endorse a little bit of numbness in her ankle area.  She denies hitting her head or pain anywhere else.  She did not have any wounds.  She was not bitten.  No change in her bowel or bladder control.  She denies a history of back surgery or back injury.  She reports last menstrual period was on 05/15/2019 and she had tubal ligation.     Past Medical History:  Diagnosis Date  . Bipolar disorder (HCC)   . Chronic constipation   . Gall bladder stones   . Headache   . Renal disorder    two tubes in left kidney going to bladder    Patient Active Problem List   Diagnosis Date Noted  . Gestational hypertension w/o significant proteinuria in 3rd trimester 06/11/2016  . Preeclampsia, third trimester 06/09/2016  . False labor before 37 completed weeks of gestation during pregnancy in third trimester, antepartum 06/07/2016  . Chronic cholecystitis with calculus 10/03/2014    Past Surgical History:  Procedure Laterality Date  . CESAREAN SECTION N/A 06/14/2016   Procedure: CESAREAN SECTION;  Surgeon: Philip Aspen, DO;  Location: [facility];  Service: Obstetrics;  Laterality: N/A;    . CHOLECYSTECTOMY    . MANDIBLE SURGERY    . renal stents    . TONSILLECTOMY      OB History    Gravida  4   Para  3   Term  1   Preterm  2   AB  1   Living  2     SAB  1   TAB  0   Ectopic  0   Multiple  0   Live Births  2            Home Medications    Prior to Admission medications   Medication Sig Start Date End Date Taking? Authorizing Provider  Melatonin 10 MG TABS Take by mouth.   Yes [provider]  cyclobenzaprine (FLEXERIL) 10 MG tablet Take 1 tablet (10 mg total) by mouth 2 (two) times daily as needed for muscle spasms. 06/01/19   Jarrett Albor, Veryl Speak, PA-C  ibuprofen (ADVIL) 600 MG tablet Take 1 tablet (600 mg total) by mouth 3 (three) times daily for 14 days. 06/01/19 06/15/19  Camdyn Laden, Veryl Speak, PA-C  oxyCODONE-acetaminophen (PERCOCET/ROXICET) 5-325 MG tablet Take 1 tablet by mouth every 4 (four) hours as needed (pain scale 4-7). 06/16/16   Levi Aland, MD  Prenatal Vit-Fe  Fumarate-FA (PRENATAL MULTIVITAMIN) TABS tablet Take 1 tablet by mouth daily at 12 noon.    [provider]  traMADol (ULTRAM) 50 MG tablet Take 1 tablet (50 mg total) by mouth every 6 (six) hours as needed. 08/04/16   Emily Filbert, MD    Family History Family History  Problem Relation Age of Onset  . Diabetes Mother   . Diabetes Father   . Hypertension Father     Social History Social History   Tobacco Use  . Smoking status: Never Smoker  . Smokeless tobacco: Never Used  Substance Use Topics  . Alcohol use: No    Comment: before preg  . Drug use: No     Allergies   Peanuts [peanut oil], Latex, and Penicillins   Review of Systems Review of Systems  Constitutional: Negative for chills and fever.  Musculoskeletal: Positive for arthralgias, back pain and gait problem. Negative for joint swelling.  Skin: Positive for color change. Negative for wound.  Neurological: Positive for numbness. Negative for weakness and headaches.     Physical Exam Triage  Vital Signs ED Triage Vitals [06/01/19 1039]  Enc Vitals Group     BP 104/69     Pulse Rate 78     Resp 16     Temp 98.2 F (36.8 C)     Temp Source Oral     SpO2 100 %     Weight      Height      Head Circumference      Peak Flow      Pain Score      Pain Loc      Pain Edu?      Excl. in [place]?    No data found.  Updated Vital Signs BP 104/69 (BP Location: Left Arm)   Pulse 78   Temp 98.2 F (36.8 C) (Oral)   Resp 16   LMP 05/15/2019 (Exact Date)   SpO2 100%   Visual Acuity Right Eye Distance:   Left Eye Distance:   Bilateral Distance:    Right Eye Near:   Left Eye Near:    Bilateral Near:     Physical Exam Vitals and nursing note reviewed.  Constitutional:      General: She is not in acute distress.    Appearance: Normal appearance. She is well-developed. She is obese.  HENT:     Head: Normocephalic and atraumatic.  Eyes:     Extraocular Movements: Extraocular movements intact.     Conjunctiva/sclera: Conjunctivae normal.     Pupils: Pupils are equal, round, and reactive to light.  Cardiovascular:     Rate and Rhythm: Normal rate.  Pulmonary:     Effort: Pulmonary effort is normal. No respiratory distress.  Musculoskeletal:     Cervical back: Normal and neck supple.     Thoracic back: Normal.     Lumbar back: Tenderness (Midline tenderness at L5-S1 region.  With tenderness on the right paraspinals.  Pain is elicited with walking.) present. No swelling or deformity. Normal range of motion. Negative right straight leg raise test.     Right upper leg: Tenderness (Lateral thigh and into hamstring region.  Along lateral hip.) present. No bony tenderness.     Left upper leg: Normal.     Comments: There is early ecchymosis on the right lateral aspect of the thigh.  Patient has full range of motion in the right leg with some pain reported with flexion and external rotation as well as  internal rotation on the lateral aspect of the hip.  No groin pain  reported.  Patient gait is fluid however he walks with an externally rotated right foot.  Skin:    General: Skin is warm and dry.     Capillary Refill: Capillary refill takes less than 2 seconds.  Neurological:     General: No focal deficit present.     Mental Status: She is alert and oriented to person, place, and time.     Comments: She has slightly diminished sensation in her right lateral foot compared to her left. 5 out of 5 strength bilaterally in lower extremities  Psychiatric:        Mood and Affect: Mood normal.        Behavior: Behavior normal.        Thought Content: Thought content normal.        Judgment: Judgment normal.      UC Treatments / Results  Labs (all labs ordered are listed, but only abnormal results are displayed) Labs Reviewed - No data to display  EKG   Radiology DG Lumbar Spine Complete  Result Date: 06/01/2019 CLINICAL DATA:  Midline tenderness after a fall with back pain while walking. EXAM: LUMBAR SPINE - COMPLETE 4+ VIEW COMPARISON:  No pertinent prior studies available for comparison. FINDINGS: Lumbar levocurvature. Mild L3-L4 retrolisthesis. Vertebral body height is maintained. No evidence of acute fracture to the lumbar spine. No definite pars interarticularis defect identified on oblique radiographs. Intervertebral disc height is preserved. Surgical clips within pelvis. IMPRESSION: No radiographic evidence of acute fracture to the lumbar spine. Mild L3-L4 retrolisthesis. Lumbar levocurvature. Electronically Signed   By: Jackey Loge DO   On: 06/01/2019 11:42    Procedures Procedures (including critical care time)  Medications Ordered in UC Medications - No data to display  Initial Impression / Assessment and Plan / UC Course  I have reviewed the triage vital signs and the nursing notes.  Pertinent labs & imaging results that were available during my care of the patient were reviewed by me and considered in my medical decision making (see  chart for details).     #Acute low back pain #Contusion of right lower extremity Patient is a 29 year old presenting to urgent care for low back pain and right leg pain after a fall.  X-rays negative for acute pathology.  No previous complaint of back pain.  Believe leg pain is muscular pain secondary contusion and that this may also be causing irritation of the sciatic nerve causing slight numbness in her lower extremity.  Discussed using NSAIDs and follow-up in 2 weeks if she is not improving.  Instructed to lift no more than 10 to 15 pounds for 1 week.  Patient understands and agrees with plan.  Final Clinical Impressions(s) / UC Diagnoses   Final diagnoses:  Fall, initial encounter  Acute low back pain without sciatica, unspecified back pain laterality  Contusion of right lower extremity, initial encounter     Discharge Instructions     Take the ibuprofen 3 times a day and he may take the Flexeril up to 3 times a day however this may make you sleepy so only take this at night.  I do not want you to lift more than 10 to 15 pounds for the next week  If your symptoms get significantly worse or not improving in 2 weeks I would like for you to return    ED Prescriptions    Medication Sig Dispense Auth. Provider  ibuprofen (ADVIL) 600 MG tablet Take 1 tablet (600 mg total) by mouth 3 (three) times daily for 14 days. 42 tablet Zitlali Primm, Veryl Speak, PA-C   cyclobenzaprine (FLEXERIL) 10 MG tablet Take 1 tablet (10 mg total) by mouth 2 (two) times daily as needed for muscle spasms. 20 tablet Elena Cothern, Veryl Speak, PA-C     PDMP not reviewed this encounter.   Hermelinda Medicus, PA-C 06/01/19 1953

## 2019-06-01 NOTE — Discharge Instructions (Addendum)
Take the ibuprofen 3 times a day and he may take the Flexeril up to 3 times a day however this may make you sleepy so only take this at night.  I do not want you to lift more than 10 to 15 pounds for the next week  If your symptoms get significantly worse or not improving in 2 weeks I would like for you to return

## 2019-06-01 NOTE — ED Triage Notes (Signed)
Pt states yesterday her dog was in a fight and yanked her down on her R side. Denies hitting head. C/o R hip pain and R lower back pain.

## 2019-07-14 ENCOUNTER — Other Ambulatory Visit: Payer: Self-pay

## 2019-07-14 ENCOUNTER — Encounter: Payer: Self-pay | Admitting: Family Medicine

## 2019-07-14 ENCOUNTER — Ambulatory Visit (INDEPENDENT_AMBULATORY_CARE_PROVIDER_SITE_OTHER): Payer: 59 | Admitting: Family Medicine

## 2019-07-14 ENCOUNTER — Other Ambulatory Visit: Payer: Self-pay | Admitting: Family Medicine

## 2019-07-14 VITALS — BP 100/70 | HR 87 | Temp 98.5°F | Ht 61.25 in | Wt 191.2 lb

## 2019-07-14 DIAGNOSIS — Z7689 Persons encountering health services in other specified circumstances: Secondary | ICD-10-CM

## 2019-07-14 DIAGNOSIS — Z3009 Encounter for other general counseling and advice on contraception: Secondary | ICD-10-CM | POA: Diagnosis not present

## 2019-07-14 DIAGNOSIS — Z91010 Allergy to peanuts: Secondary | ICD-10-CM

## 2019-07-14 MED ORDER — EPINEPHRINE 0.3 MG/0.3ML IJ SOAJ: 0.3000 mg | INTRAMUSCULAR | 1 refills | Status: DC | PRN

## 2019-07-14 NOTE — Progress Notes (Signed)
Sharon Townsend is a 29 y.o. female  Chief Complaint  Patient presents with  . Establish Care    New pt c/o back pain before she starts her period. cramps and headaches.    HPI: Sharon Townsend is a 29 y.o. female here as a new pt to establish care with our office. Pt complains of low back pain, abdominal cramping, and headaches 10-14 days prior to the start of her monthly period and this lasts for 2-3 days after period stops. She also occasionally endorses nausea. Bleeding lasts 5-7 days and period occurs every 28-32 days. Bleeding heavier in the past few years since her c-section in 2018 and period lasts longer (5-7 days vs 3). She takes ibuprofen. She had tubal ligation done in 2018.  She does not want depo shot d/t weight gain and "hair falling out". She also states her body "doesn't do well with pills".   Past Medical History:  Diagnosis Date  . Bipolar disorder ([place])   . Chronic constipation   . Gall bladder stones   . Headache   . Renal disorder    two tubes in left kidney going to bladder    Past Surgical History:  Procedure Laterality Date  . CESAREAN SECTION N/A 06/14/2016   Procedure: CESAREAN SECTION;  Surgeon: Allyn Kenner, DO;  Location: [facility];  Service: Obstetrics;  Laterality: N/A;  . child birth  2016  . CHOLECYSTECTOMY    . MANDIBLE SURGERY    . renal stents    . TONSILLECTOMY    . TUBAL LIGATION  06/14/2016    Social History   Socioeconomic History  . Marital status: Married    Spouse name: Not on file  . Number of children: Not on file  . Years of education: Not on file  . Highest education level: Not on file  Occupational History  . Not on file  Tobacco Use  . Smoking status: Never Smoker  . Smokeless tobacco: Never Used  Substance and Sexual Activity  . Alcohol use: No    Comment: before preg  . Drug use: No  . Sexual activity: Yes    Birth control/protection: None    Comment: last sex Mar 9th 2018  Other Topics  Concern  . Not on file  Social History Narrative  . Not on file   Social Determinants of Health   Financial Resource Strain:   . Difficulty of Paying Living Expenses:   Food Insecurity:   . Worried About Charity fundraiser in the Last Year:   . Arboriculturist in the Last Year:   Transportation Needs:   . Film/video editor (Medical):   Marland Kitchen Lack of Transportation (Non-Medical):   Physical Activity:   . Days of Exercise per Week:   . Minutes of Exercise per Session:   Stress:   . Feeling of Stress :   Social Connections:   . Frequency of Communication with Friends and Family:   . Frequency of Social Gatherings with Friends and Family:   . Attends Religious Services:   . Active Member of Clubs or Organizations:   . Attends Archivist Meetings:   Marland Kitchen Marital Status:   Intimate Partner Violence:   . Fear of Current or Ex-Partner:   . Emotionally Abused:   Marland Kitchen Physically Abused:   . Sexually Abused:     Family History  Problem Relation Age of Onset  . Diabetes Mother   . Diabetes Father   .  Hypertension Father      Immunization History  Administered Date(s) Administered  . MMR 08/05/2014  . Tdap 08/04/2014    Outpatient Encounter Medications as of 07/14/2019  Medication Sig  . cyclobenzaprine (FLEXERIL) 10 MG tablet Take 10 mg by mouth 3 (three) times daily as needed for muscle spasms.  . Melatonin 10 MG TABS Take by mouth.  . [DISCONTINUED] cyclobenzaprine (FLEXERIL) 10 MG tablet Take 1 tablet (10 mg total) by mouth 2 (two) times daily as needed for muscle spasms.  . [DISCONTINUED] oxyCODONE-acetaminophen (PERCOCET/ROXICET) 5-325 MG tablet Take 1 tablet by mouth every 4 (four) hours as needed (pain scale 4-7).  . [DISCONTINUED] Prenatal Vit-Fe Fumarate-FA (PRENATAL MULTIVITAMIN) TABS tablet Take 1 tablet by mouth daily at 12 noon.  . [DISCONTINUED] traMADol (ULTRAM) 50 MG tablet Take 1 tablet (50 mg total) by mouth every 6 (six) hours as needed.   No  facility-administered encounter medications on file as of 07/14/2019.     ROS: Pertinent positives and negatives noted in HPI. Remainder of ROS non-contributory   Allergies  Allergen Reactions  . Peanuts [Peanut Oil] Anaphylaxis  . Latex Swelling  . Penicillins Other (See Comments)    Reaction:  Pt states that it "shuts down her bowels and cannot urinate" Has patient had a PCN reaction causing immediate rash, facial/tongue/throat swelling, SOB or lightheadedness with hypotension: No Has patient had a PCN reaction causing severe rash involving mucus membranes or skin necrosis: No Has patient had a PCN reaction that required hospitalization No Has patient had a PCN reaction occurring within the last 10 years: No If all of the above answers are "NO", then may proceed with Cephalosp    BP 100/70 (BP Location: Left Arm, Patient Position: Sitting, Cuff Size: Normal)   Pulse 87   Temp 98.5 F (36.9 C) (Temporal)   Ht 5' 1.25" (1.556 m)   Wt 191 lb 3.2 oz (86.7 kg)   LMP 07/12/2019   SpO2 98%   BMI 35.83 kg/m   Physical Exam  Constitutional: She is oriented to person, place, and time. She appears well-developed and well-nourished. No distress.  Pulmonary/Chest: No respiratory distress.  Neurological: She is alert and oriented to person, place, and time.  Psychiatric: She has a normal mood and affect. Her behavior is normal.     A/P:  1. Encounter to establish care with new doctor  2. Counseling for birth control regarding intrauterine device (IUD) - Ambulatory referral to Obstetrics / Gynecology   I personally spent 25 min with the patient today and greater than 50% was spent in counseling, coordination of care, education  This visit occurred during the SARS-CoV-2 public health emergency.  Safety protocols were in place, including screening questions prior to the visit, additional usage of staff PPE, and extensive cleaning of exam room while observing appropriate contact time  as indicated for disinfecting solutions.

## 2019-07-14 NOTE — Addendum Note (Signed)
Addended by: Overton Mam on: 07/14/2019 02:49 PM   Modules accepted: Orders

## 2019-07-14 NOTE — Progress Notes (Signed)
error 

## 2019-07-20 ENCOUNTER — Encounter: Payer: 59 | Admitting: Family Medicine

## 2019-07-27 ENCOUNTER — Encounter: Payer: 59 | Admitting: Family Medicine

## 2019-08-03 ENCOUNTER — Ambulatory Visit (INDEPENDENT_AMBULATORY_CARE_PROVIDER_SITE_OTHER): Payer: 59 | Admitting: Family Medicine

## 2019-08-03 DIAGNOSIS — Z5329 Procedure and treatment not carried out because of patient's decision for other reasons: Secondary | ICD-10-CM

## 2019-08-03 NOTE — Progress Notes (Signed)
No show

## 2019-08-24 ENCOUNTER — Other Ambulatory Visit: Payer: Self-pay

## 2019-08-24 ENCOUNTER — Encounter (HOSPITAL_COMMUNITY): Payer: Self-pay | Admitting: Emergency Medicine

## 2019-08-24 ENCOUNTER — Emergency Department (HOSPITAL_COMMUNITY)
Admission: EM | Admit: 2019-08-24 | Discharge: 2019-08-24 | Disposition: A | Discharge status: Home / Self Care | Payer: 59 | Attending: Emergency Medicine | Admitting: Emergency Medicine

## 2019-08-24 ENCOUNTER — Encounter: Payer: Self-pay | Admitting: Family Medicine

## 2019-08-24 DIAGNOSIS — Z91010 Allergy to peanuts: Secondary | ICD-10-CM | POA: Diagnosis not present

## 2019-08-24 DIAGNOSIS — W260XXA Contact with knife, initial encounter: Secondary | ICD-10-CM | POA: Insufficient documentation

## 2019-08-24 DIAGNOSIS — Y929 Unspecified place or not applicable: Secondary | ICD-10-CM | POA: Insufficient documentation

## 2019-08-24 DIAGNOSIS — Y93G1 Activity, food preparation and clean up: Secondary | ICD-10-CM | POA: Diagnosis not present

## 2019-08-24 DIAGNOSIS — S61412A Laceration without foreign body of left hand, initial encounter: Secondary | ICD-10-CM | POA: Diagnosis present

## 2019-08-24 DIAGNOSIS — Y999 Unspecified external cause status: Secondary | ICD-10-CM | POA: Diagnosis not present

## 2019-08-24 DIAGNOSIS — Z91040 Latex allergy status: Secondary | ICD-10-CM | POA: Insufficient documentation

## 2019-08-24 MED ORDER — LIDOCAINE-EPINEPHRINE (PF) 2 %-1:200000 IJ SOLN
INTRAMUSCULAR | Status: AC
  Administered 2019-08-24: 20 mL
  Filled 2019-08-24: qty 20

## 2019-08-24 MED ORDER — NAPROXEN 500 MG PO TABS: 500.0000 mg | ORAL_TABLET | Freq: Two times a day (BID) | ORAL | 0 refills | Status: DC

## 2019-08-24 MED ORDER — BACITRACIN ZINC 500 UNIT/GM EX OINT
1.0000 "application " | TOPICAL_OINTMENT | Freq: Two times a day (BID) | CUTANEOUS | Status: DC
  Administered 2019-08-24: 1 via TOPICAL
  Filled 2019-08-24: qty 0.9

## 2019-08-24 NOTE — ED Notes (Signed)
See down time charting from 1250- present

## 2019-08-24 NOTE — Discharge Instructions (Signed)
Please have your family doctor or the urgent care remove your stitches within 7 days.  If you should develop increasing redness pain swelling pus or fever return to the emergency department immediately.  Tylenol or ibuprofen for pain, ice pack to minimize swelling and bleeding

## 2019-08-24 NOTE — ED Provider Notes (Signed)
[facility] EMERGENCY DEPARTMENT Provider Note   CSN: [id number] Arrival date & time: 08/24/19  1042     History Chief Complaint  Patient presents with  . Hand Injury    Sharon Townsend is a 29 y.o. female.  HPI    This patient is a very pleasant 29 year old female presenting to the hospital today with a complaint of a laceration to the left hand.  This occurred at 10:00 this morning approximately 3 hours prior to arrival when the patient was cutting an apple with a steak knife.  The dog jumped up onto her causing her hand to go forward and she stabbed herself in the webspace between the thumb and the index finger of the left hand.  There was acute onset of pain, bleeding and a laceration which is continued but gradually improved.  The pain is located over the thenar eminence of the left hand on the palmar surface.  She denies any numbness or weakness of the hand.  She has not had anything for pain after this.  She is up-to-date on tetanus as of 3 years ago.  Past Medical History:  Diagnosis Date  . Bipolar disorder (HCC)   . Chronic constipation   . Gall bladder stones   . Headache   . Renal disorder    two tubes in left kidney going to bladder    Patient Active Problem List   Diagnosis Date Noted  . Gestational hypertension w/o significant proteinuria in 3rd trimester 06/11/2016  . Preeclampsia, third trimester 06/09/2016  . False labor before 37 completed weeks of gestation during pregnancy in third trimester, antepartum 06/07/2016  . Chronic cholecystitis with calculus 10/03/2014    Past Surgical History:  Procedure Laterality Date  . CESAREAN SECTION N/A 06/14/2016   Procedure: CESAREAN SECTION;  Surgeon: Philip Aspen, DO;  Location: [facility];  Service: Obstetrics;  Laterality: N/A;  . child birth  2016  . CHOLECYSTECTOMY    . MANDIBLE SURGERY    . renal stents    . TONSILLECTOMY    . TUBAL LIGATION  06/14/2016     OB History    Gravida  4   Para  3   Term  1   Preterm  2   AB  1   Living  2     SAB  1   TAB  0   Ectopic  0   Multiple  0   Live Births  2           Family History  Problem Relation Age of Onset  . Diabetes Mother   . Diabetes Father   . Hypertension Father     Social History   Tobacco Use  . Smoking status: Never Smoker  . Smokeless tobacco: Never Used  Substance Use Topics  . Alcohol use: No    Comment: before preg  . Drug use: No    Home Medications Prior to Admission medications   Medication Sig Start Date End Date Taking? Authorizing Provider  cyclobenzaprine (FLEXERIL) 10 MG tablet Take 10 mg by mouth 3 (three) times daily as needed for muscle spasms.    [provider]  EPINEPHrine 0.3 mg/0.3 mL IJ SOAJ injection Inject 0.3 mLs (0.3 mg total) into the muscle as needed for anaphylaxis. 07/14/19   Cirigliano, Jearld Lesch, DO  Melatonin 10 MG TABS Take by mouth.    [provider]  naproxen (NAPROSYN) 500 MG tablet Take 1 tablet (500 mg total) by  mouth 2 (two) times daily with a meal. 08/24/19   Noemi Chapel, MD    Allergies    Peanuts [peanut oil], Latex, and Penicillins  Review of Systems   Review of Systems  Constitutional: Negative for fever.  Gastrointestinal: Negative for vomiting.  Skin: Positive for wound.       Laceration  Neurological: Negative for weakness and numbness.    Physical Exam Updated Vital Signs BP (!) 136/94   Pulse 82   Temp 99.1 F (37.3 C) (Oral)   Resp 14   Ht 1.549 m (5\' 1" )   Wt 86.2 kg   SpO2 99%   BMI 35.90 kg/m   Physical Exam Constitutional:      General: She is not in acute distress.    Appearance: She is well-developed. She is not diaphoretic.  HENT:     Head: Normocephalic.  Eyes:     General: No scleral icterus.    Conjunctiva/sclera: Conjunctivae normal.  Cardiovascular:     Rate and Rhythm: Normal rate and regular rhythm.  Pulmonary:     Effort: Pulmonary effort is normal.      Breath sounds: Normal breath sounds.  Musculoskeletal:        General: Tenderness ( ttp over the laceration site) present. Normal range of motion.  Skin:    General: Skin is warm and dry.     Comments: Laceration located on left hand in the first webspace The Laceration is linear shaped The depth is 5 mm The length is 1.5 cm  Neurological:     Mental Status: She is alert.     Coordination: Coordination normal.     Comments: Sensation and motor intact     ED Results / Procedures / Treatments   Labs (all labs ordered are listed, but only abnormal results are displayed) Labs Reviewed - No data to display  EKG None  Radiology No results found.  Procedures .Marland KitchenLaceration Repair  Date/Time: 08/24/2019 1:59 PM Performed by: Noemi Chapel, MD Authorized by: Noemi Chapel, MD   Consent:    Consent obtained:  Verbal   Consent given by:  Patient   Risks discussed:  Pain, infection, poor cosmetic result, poor wound healing and vascular damage Anesthesia (see MAR for exact dosages):    Anesthesia method:  Local infiltration   Local anesthetic:  Lidocaine 1% WITH epi Laceration details:    Location:  Hand   Hand location: Webspace of the left hand between thumb and second finger.   Length (cm):  1.5   Depth (mm):  5 Repair type:    Repair type:  Simple Pre-procedure details:    Preparation:  Patient was prepped and draped in usual sterile fashion and imaging obtained to evaluate for foreign bodies Exploration:    Hemostasis achieved with:  Epinephrine   Wound exploration: wound explored through full range of motion and entire depth of wound probed and visualized     Wound extent: no fascia violation noted, no foreign bodies/material noted, no nerve damage noted, no tendon damage noted, no underlying fracture noted and no vascular damage noted     Contaminated: no   Treatment:    Area cleansed with:  Betadine   Amount of cleaning:  Extensive   Irrigation solution:  Sterile  saline   Irrigation volume:  500   Irrigation method:  Pressure wash Skin repair:    Repair method:  Sutures   Suture size:  5-0   Suture material:  Prolene   Suture technique:  Simple interrupted   Number of sutures:  2 Approximation:    Approximation:  Close Post-procedure details:    Dressing:  Antibiotic ointment and sterile dressing   Patient tolerance of procedure:  Tolerated well, no immediate complications Comments:         (including critical care time)  Medications Ordered in ED Medications  lidocaine-EPINEPHrine (XYLOCAINE W/EPI) 2 %-1:200000 (PF) injection (has no administration in time range)  bacitracin ointment 1 application (has no administration in time range)    ED Course  I have reviewed the triage vital signs and the nursing notes.  Pertinent labs & imaging results that were available during my care of the patient were reviewed by me and considered in my medical decision making (see chart for details).    MDM Rules/Calculators/A&P                      Mechanism suggest no foreign bodies, no fracture, she is neurovascularly intact with regard to both fingers with normal sensation normal normal flexion and extension as well.  2 sutures placed, patient instructed on follow-up care, she is agreeable  Final Clinical Impression(s) / ED Diagnoses Final diagnoses:  Laceration of left hand without foreign body, initial encounter    Rx / DC Orders ED Discharge Orders         Ordered    naproxen (NAPROSYN) 500 MG tablet  2 times daily with meals     08/24/19 1402           Eber Hong, MD 08/24/19 1404

## 2019-08-24 NOTE — ED Triage Notes (Signed)
Pt reports she cut her L hand while slicing an apple, endorses some numbness to thumb and index finger. Laceration near base of L thumb, bleeding controlled, last tetanus 3 years ago.

## 2019-11-03 ENCOUNTER — Ambulatory Visit (HOSPITAL_COMMUNITY)
Admission: EM | Admit: 2019-11-03 | Discharge: 2019-11-03 | Disposition: A | Discharge status: Home / Self Care | Payer: 59 | Attending: Family Medicine | Admitting: Family Medicine

## 2019-11-03 ENCOUNTER — Other Ambulatory Visit: Payer: Self-pay

## 2019-11-03 ENCOUNTER — Encounter (HOSPITAL_COMMUNITY): Payer: Self-pay

## 2019-11-03 DIAGNOSIS — R109 Unspecified abdominal pain: Secondary | ICD-10-CM | POA: Insufficient documentation

## 2019-11-03 DIAGNOSIS — R300 Dysuria: Secondary | ICD-10-CM | POA: Insufficient documentation

## 2019-11-03 DIAGNOSIS — N390 Urinary tract infection, site not specified: Secondary | ICD-10-CM | POA: Diagnosis not present

## 2019-11-03 LAB — POCT URINALYSIS DIPSTICK, ED / UC
Bilirubin Urine: NEGATIVE
Glucose, UA: NEGATIVE mg/dL
Ketones, ur: NEGATIVE mg/dL
Nitrite: NEGATIVE
Protein, ur: 100 mg/dL — AB
Specific Gravity, Urine: >=1.03 (ref 1.005–1.030)
Urobilinogen, UA: 0.2 mg/dL (ref 0.0–1.0)
pH: 6.5 (ref 5.0–8.0)

## 2019-11-03 MED ORDER — PHENAZOPYRIDINE HCL 200 MG PO TABS: 200.0000 mg | ORAL_TABLET | Freq: Three times a day (TID) | ORAL | 0 refills | Status: DC

## 2019-11-03 NOTE — Discharge Instructions (Addendum)
You may have a urinary tract infection.   We are going to culture your urine and will call you as soon as we have the results.   I have sent in pyridium for you to take for painful urination.  Drink plenty of water, 8-10 glasses per day.   Follow up with your primary care provider as needed.   Go to the Emergency Department if you experience severe pain, shortness of breath, high fever, or other concerns.

## 2019-11-03 NOTE — ED Provider Notes (Signed)
[facility]   CC: UTI  SUBJECTIVE:  Sharon Townsend is a 29 y.o. female who complains of urinary frequency, urgency and dysuria for the past 2 days.  Patient denies a precipitating event, recent sexual encounter, excessive caffeine intake. Localizes the pain to the low back.  Pain is intermittent and describes it as burning.  Has not tried OTC medications.  Symptoms are made worse with urination. Admits to similar symptoms in the past.  Denies fever, chills, nausea, vomiting, abdominal pain, abnormal vaginal discharge or bleeding, hematuria.    LMP: Patient's last menstrual period was 10/29/2019.  ROS: As in HPI.  All other pertinent ROS negative.     Past Medical History:  Diagnosis Date  . Bipolar disorder (HCC)   . Chronic constipation   . Gall bladder stones   . Headache   . Renal disorder    two tubes in left kidney going to bladder   Past Surgical History:  Procedure Laterality Date  . CESAREAN SECTION N/A 06/14/2016   Procedure: CESAREAN SECTION;  Surgeon: Philip Aspen, DO;  Location: [facility] BIRTHING SUITES;  Service: Obstetrics;  Laterality: N/A;  . child birth  2016  . CHOLECYSTECTOMY    . MANDIBLE SURGERY    . renal stents    . TONSILLECTOMY    . TUBAL LIGATION  06/14/2016   Allergies  Allergen Reactions  . Peanut Oil Anaphylaxis  . Latex Swelling and Other (See Comments)  . Penicillins Other (See Comments)    Reaction:  Pt states that it "shuts down her bowels and cannot urinate" Has patient had a PCN reaction causing immediate rash, facial/tongue/throat swelling, SOB or lightheadedness with hypotension: No Has patient had a PCN reaction causing severe rash involving mucus membranes or skin necrosis: No Has patient had a PCN reaction that required hospitalization No Has patient had a PCN reaction occurring within the last 10 years: No If all of the above answers are "NO", then may proceed with Cephalosp Reaction:  Pt states that it "shuts down her  bowel movements". Reaction:  Pt states that it "shuts down her bowels and cannot urinate"    No current facility-administered medications on file prior to encounter.   Current Outpatient Medications on File Prior to Encounter  Medication Sig Dispense Refill  . cyclobenzaprine (FLEXERIL) 10 MG tablet Take 10 mg by mouth 3 (three) times daily as needed for muscle spasms.    Marland Kitchen EPINEPHrine 0.3 mg/0.3 mL IJ SOAJ injection Inject 0.3 mLs (0.3 mg total) into the muscle as needed for anaphylaxis. 1 each 1  . Melatonin 10 MG TABS Take by mouth.    . naproxen (NAPROSYN) 500 MG tablet Take 1 tablet (500 mg total) by mouth 2 (two) times daily with a meal. 30 tablet 0   Social History   Socioeconomic History  . Marital status: Married    Spouse name: Not on file  . Number of children: Not on file  . Years of education: Not on file  . Highest education level: Not on file  Occupational History  . Not on file  Tobacco Use  . Smoking status: Never Smoker  . Smokeless tobacco: Never Used  Substance and Sexual Activity  . Alcohol use: No    Comment: before preg  . Drug use: No  . Sexual activity: Yes    Birth control/protection: None    Comment: last sex Mar 9th 2018  Other Topics Concern  . Not on file  Social History Narrative  .  Not on file   Social Determinants of Health   Financial Resource Strain:   . Difficulty of Paying Living Expenses:   Food Insecurity:   . Worried About Programme researcher, broadcasting/film/video in the Last Year:   . Barista in the Last Year:   Transportation Needs:   . Freight forwarder (Medical):   Marland Kitchen Lack of Transportation (Non-Medical):   Physical Activity:   . Days of Exercise per Week:   . Minutes of Exercise per Session:   Stress:   . Feeling of Stress :   Social Connections:   . Frequency of Communication with Friends and Family:   . Frequency of Social Gatherings with Friends and Family:   . Attends Religious Services:   . Active Member of Clubs or  Organizations:   . Attends Banker Meetings:   Marland Kitchen Marital Status:   Intimate Partner Violence:   . Fear of Current or Ex-Partner:   . Emotionally Abused:   Marland Kitchen Physically Abused:   . Sexually Abused:    Family History  Problem Relation Age of Onset  . Diabetes Mother   . Diabetes Father   . Hypertension Father     OBJECTIVE:  Vitals:   11/03/19 1605  BP: 121/76  Pulse: 86  Resp: 18  Temp: 98.1 F (36.7 C)  TempSrc: Oral  SpO2: 99%   General appearance: AOx3 in no acute distress HEENT: NCAT. Oropharynx clear.  Lungs: clear to auscultation bilaterally without adventitious breath sounds Heart: regular rate and rhythm. Radial pulses 2+ symmetrical bilaterally Abdomen: soft; non-distended; suprapubic tenderness; bowel sounds present; no guarding or rebound tenderness Back: no CVA tenderness Extremities: no edema; symmetrical with no gross deformities Skin: warm and dry Neurologic: Ambulates from chair to exam table without difficulty Psychological: alert and cooperative; normal mood and affect  Labs Reviewed  POCT URINALYSIS DIPSTICK, ED / UC - Abnormal; Notable for the following components:      Result Value   Hgb urine dipstick LARGE (*)    Protein, ur 100 (*)    Leukocytes,Ua MODERATE (*)    All other components within normal limits  URINE CULTURE    ASSESSMENT & PLAN:  1. Dysuria   2. Bilateral flank pain     Meds ordered this encounter  Medications  . phenazopyridine (PYRIDIUM) 200 MG tablet    Sig: Take 1 tablet (200 mg total) by mouth 3 (three) times daily.    Dispense:  6 tablet    Refill:  0    Order Specific Question:   Supervising Provider    Answer:   Merrilee Jansky [id number]    Urine culture sent  We will call you with abnormal results that need further treatment Push fluids and get plenty of rest Take pyridium as prescribed and as needed for symptomatic relief Follow up with PCP if symptoms persists Return here or go to ER if  you have any new or worsening symptoms such as fever, worsening abdominal pain, nausea/vomiting, flank pain  Outlined signs and symptoms indicating need for more acute intervention Patient verbalized understanding After Visit Summary given     Moshe Cipro, NP 11/03/19 1716

## 2019-11-03 NOTE — ED Triage Notes (Signed)
Pt is here with frequent urination, burning and mild back pain that started 2 days ago, pt has taken Advil to relieve discomfort.

## 2019-11-05 LAB — URINE CULTURE: Culture: >=100000 — AB

## 2019-11-07 ENCOUNTER — Emergency Department (HOSPITAL_COMMUNITY)
Admission: EM | Admit: 2019-11-07 | Discharge: 2019-11-07 | Disposition: A | Discharge status: Home / Self Care | Payer: 59 | Attending: Emergency Medicine | Admitting: Emergency Medicine

## 2019-11-07 ENCOUNTER — Other Ambulatory Visit: Payer: Self-pay

## 2019-11-07 ENCOUNTER — Telehealth: Payer: 59 | Admitting: Physician Assistant

## 2019-11-07 DIAGNOSIS — N12 Tubulo-interstitial nephritis, not specified as acute or chronic: Secondary | ICD-10-CM

## 2019-11-07 DIAGNOSIS — Z91040 Latex allergy status: Secondary | ICD-10-CM | POA: Diagnosis not present

## 2019-11-07 DIAGNOSIS — Z91010 Allergy to peanuts: Secondary | ICD-10-CM | POA: Insufficient documentation

## 2019-11-07 DIAGNOSIS — N390 Urinary tract infection, site not specified: Secondary | ICD-10-CM

## 2019-11-07 DIAGNOSIS — N3090 Cystitis, unspecified without hematuria: Secondary | ICD-10-CM | POA: Insufficient documentation

## 2019-11-07 DIAGNOSIS — R350 Frequency of micturition: Secondary | ICD-10-CM | POA: Diagnosis not present

## 2019-11-07 DIAGNOSIS — M545 Low back pain: Secondary | ICD-10-CM | POA: Insufficient documentation

## 2019-11-07 MED ORDER — ONDANSETRON 4 MG PO TBDP: 4.0000 mg | ORAL_TABLET | Freq: Three times a day (TID) | ORAL | 0 refills | Status: DC | PRN

## 2019-11-07 MED ORDER — TRAMADOL HCL 50 MG PO TABS: 50.0000 mg | ORAL_TABLET | Freq: Four times a day (QID) | ORAL | 0 refills | Status: DC | PRN

## 2019-11-07 MED ORDER — CIPROFLOXACIN HCL 500 MG PO TABS: 500.0000 mg | ORAL_TABLET | Freq: Two times a day (BID) | ORAL | 0 refills | Status: AC

## 2019-11-07 NOTE — Discharge Instructions (Signed)
Return if any problems.

## 2019-11-07 NOTE — ED Provider Notes (Signed)
[facility] EMERGENCY DEPARTMENT Provider Note   CSN: [id number] Arrival date & time: 11/07/19  1707     History No chief complaint on file.   Sharon Townsend is a 29 y.o. female.  Pt was seen at urgent care and diagnosed with a uti.  Pt did not get antibiotics pending culture.  Pt reports some soreness in her low back.    The history is provided by the patient. No language interpreter was used.  Urinary Frequency This is a new problem. The current episode started more than 2 days ago. The problem occurs constantly. The problem has been gradually worsening. Nothing aggravates the symptoms. Nothing relieves the symptoms.       Past Medical History:  Diagnosis Date  . Bipolar disorder (HCC)   . Chronic constipation   . Gall bladder stones   . Headache   . Renal disorder    two tubes in left kidney going to bladder    Patient Active Problem List   Diagnosis Date Noted  . Gestational hypertension w/o significant proteinuria in 3rd trimester 06/11/2016  . Preeclampsia, third trimester 06/09/2016  . False labor before 37 completed weeks of gestation during pregnancy in third trimester, antepartum 06/07/2016  . Chronic cholecystitis with calculus 10/03/2014    Past Surgical History:  Procedure Laterality Date  . CESAREAN SECTION N/A 06/14/2016   Procedure: CESAREAN SECTION;  Surgeon: Philip Aspen, DO;  Location: [facility] BIRTHING SUITES;  Service: Obstetrics;  Laterality: N/A;  . child birth  2016  . CHOLECYSTECTOMY    . MANDIBLE SURGERY    . renal stents    . TONSILLECTOMY    . TUBAL LIGATION  06/14/2016     OB History    Gravida  4   Para  3   Term  1   Preterm  2   AB  1   Living  2     SAB  1   TAB  0   Ectopic  0   Multiple  0   Live Births  2           Family History  Problem Relation Age of Onset  . Diabetes Mother   . Diabetes Father   . Hypertension Father     Social History   Tobacco Use  . Smoking status:  Never Smoker  . Smokeless tobacco: Never Used  Substance Use Topics  . Alcohol use: No    Comment: before preg  . Drug use: No    Home Medications Prior to Admission medications   Medication Sig Start Date End Date Taking? Authorizing Provider  ciprofloxacin (CIPRO) 500 MG tablet Take 1 tablet (500 mg total) by mouth 2 (two) times daily for 10 days. 11/07/19 11/17/19  Elson Areas, PA-C  cyclobenzaprine (FLEXERIL) 10 MG tablet Take 10 mg by mouth 3 (three) times daily as needed for muscle spasms.    [provider]  EPINEPHrine 0.3 mg/0.3 mL IJ SOAJ injection Inject 0.3 mLs (0.3 mg total) into the muscle as needed for anaphylaxis. 07/14/19   Cirigliano, Jearld Lesch, DO  Melatonin 10 MG TABS Take by mouth.    [provider]  naproxen (NAPROSYN) 500 MG tablet Take 1 tablet (500 mg total) by mouth 2 (two) times daily with a meal. 08/24/19   Eber Hong, MD  ondansetron (ZOFRAN ODT) 4 MG disintegrating tablet Take 1 tablet (4 mg total) by mouth every 8 (eight) hours as needed for nausea or vomiting. 11/07/19  Elson Areas, PA-C  phenazopyridine (PYRIDIUM) 200 MG tablet Take 1 tablet (200 mg total) by mouth 3 (three) times daily. 11/03/19   Moshe Cipro, NP  traMADol (ULTRAM) 50 MG tablet Take 1 tablet (50 mg total) by mouth every 6 (six) hours as needed. 11/07/19 11/06/20  Elson Areas, PA-C    Allergies    Peanut oil, Latex, and Penicillins  Review of Systems   Review of Systems  Genitourinary: Positive for frequency.  All other systems reviewed and are negative.   Physical Exam Updated Vital Signs BP 128/83   Pulse 87   Temp 98.9 F (37.2 C)   Resp 18   LMP 10/29/2019   SpO2 98%   Physical Exam Vitals and nursing note reviewed.  Constitutional:      Appearance: She is well-developed.  HENT:     Head: Normocephalic.  Cardiovascular:     Rate and Rhythm: Normal rate.  Pulmonary:     Effort: Pulmonary effort is normal.  Abdominal:     General:  There is no distension.  Musculoskeletal:        General: Normal range of motion.     Cervical back: Normal range of motion.  Skin:    General: Skin is warm.  Neurological:     General: No focal deficit present.     Mental Status: She is alert and oriented to person, place, and time.  Psychiatric:        Mood and Affect: Mood normal.     ED Results / Procedures / Treatments   Labs (all labs ordered are listed, but only abnormal results are displayed) Labs Reviewed - No data to display  EKG None  Radiology No results found.  Procedures Procedures (including critical care time)  Medications Ordered in ED Medications - No data to display  ED Course  I have reviewed the triage vital signs and the nursing notes.  Pertinent labs & imaging results that were available during my care of the patient were reviewed by me and considered in my medical decision making (see chart for details).    MDM Rules/Calculators/A&P                          Urine culture reviewed.  Pt given rx for cipro as she has had some flank pain.  (I doubt pyelo)  Final Clinical Impression(s) / ED Diagnoses Final diagnoses:  Urinary tract infection without hematuria, site unspecified    Rx / DC Orders ED Discharge Orders         Ordered    ciprofloxacin (CIPRO) 500 MG tablet  2 times daily     Discontinue  Reprint     11/07/19 1749    traMADol (ULTRAM) 50 MG tablet  Every 6 hours PRN     Discontinue  Reprint     11/07/19 1749    ondansetron (ZOFRAN ODT) 4 MG disintegrating tablet  Every 8 hours PRN     Discontinue  Reprint     11/07/19 1749        An After Visit Summary was printed and given to the patient.    Elson Areas, [place] 11/07/19 1851    Pollyann Savoy, MD 11/07/19 2255

## 2019-11-07 NOTE — Progress Notes (Signed)
10:24 AM Records reviewed.  Pt was seen on 11/03/2019 for urinary symptoms.  UA dipstick showed large hgb, moderate leukocytes and protein. Pt was prescribed Pyridium and urine was sent for culture.  Urine culture showed >100K colonies of S. Saprophyticus susceptible to cipro, nitrofurantoin and bactrim.    However, given her current symptoms I have some concern for severe pyelo and possible sepsis.  Pt will need further evaluation.     Based on what you shared with me, I feel your condition warrants further evaluation and I recommend that you be evaluated at the nearest emergency room.  I reviewed your records and labs from your urgent care visit several days ago.  It is clear that you have a urinary tract infection.  Your urine culture showed good susceptibility for many outpatient antibiotics, however given your fever, back pain, abdominal pain and vomiting I am concerned this may have turned into a severe kidney infection. Sometimes this requires hospitalization and IV antibiotics.  At this time, the safest option is to be evaluated in one of our emergency rooms.    I recommend the following:  . [facility] Emergency Department [address] [place], [place], [place] [address] [phone]  . [facility] Emergency Department [address] [place], [place] [address] [phone]  . [facility] Emergency Department [address] [place], [place], [place] [address] [phone]  . [facility] Emergency Department [address] [place], [place], [place] [address] [phone]  . [facility] Emergency Department [address] [place], [place], [place] [address] [phone]    NOTE: If you entered your credit card information for this eVisit, you will not be charged. You may see a "hold" on your card for the $35 but that hold will drop off and you will not have a charge processed.   If you are  having a true medical emergency please call 911.      Your e-visit answers were reviewed by a board certified advanced clinical practitioner to complete your personal care plan.  Thank you for using e-Visits.  Greater than 5 minutes, yet less than 10 minutes of time have been spent researching, coordinating, and implementing care for this patient today

## 2019-11-07 NOTE — ED Notes (Signed)
Patient Alert and oriented to baseline. Stable and ambulatory to baseline. Patient verbalized understanding of the discharge instructions.  Patient belongings were taken by the patient.   

## 2019-11-07 NOTE — ED Triage Notes (Signed)
Pt here pov with reports of dx with UTI earlier this week. Pt now reports worsening back pain, fevers and NV. Pt states never given abx.

## 2019-11-22 ENCOUNTER — Ambulatory Visit: Payer: 59 | Admitting: Obstetrics and Gynecology

## 2019-11-24 ENCOUNTER — Telehealth: Payer: Self-pay | Admitting: Family Medicine

## 2019-11-24 NOTE — Telephone Encounter (Signed)
Symptoms could be side effect from the booster vaccine and they typically last less than 24-48hrs. Recommend tylenol or ibuprofen as needed, lots of fluids, rest.

## 2019-11-24 NOTE — Telephone Encounter (Signed)
Patient got the Medtronic vaccine and now has a fever of 101 with chills/aches and a swollen/sore arm.She wants to know if this is normal. Please call her back at [phone] and advise.

## 2019-11-24 NOTE — Telephone Encounter (Signed)
Dr. Salena Saner please advise Pt wanted me to ask you if this was normal after getting the Medtronic vaccine. She is having chillds an fever of 101.

## 2019-11-27 NOTE — Telephone Encounter (Signed)
Left voicemail for pt to call us back or send [place] a my chart message to let us know if her symptoms have diminished or got better.

## 2019-11-29 ENCOUNTER — Other Ambulatory Visit: Payer: Self-pay

## 2019-11-30 ENCOUNTER — Encounter: Payer: Self-pay | Admitting: Family Medicine

## 2019-11-30 ENCOUNTER — Ambulatory Visit (INDEPENDENT_AMBULATORY_CARE_PROVIDER_SITE_OTHER): Payer: 59 | Admitting: Family Medicine

## 2019-11-30 VITALS — BP 112/82 | HR 94 | Temp 98.6°F | Ht 61.0 in | Wt 194.8 lb

## 2019-11-30 DIAGNOSIS — Z0000 Encounter for general adult medical examination without abnormal findings: Secondary | ICD-10-CM

## 2019-11-30 LAB — CBC
HCT: 38.9 % (ref 36.0–46.0)
Hemoglobin: 13.2 g/dL (ref 12.0–15.0)
MCHC: 34.1 g/dL (ref 30.0–36.0)
MCV: 86.4 fl (ref 78.0–100.0)
Platelets: 276 10*3/uL (ref 150.0–400.0)
RBC: 4.5 Mil/uL (ref 3.87–5.11)
RDW: 12.3 % (ref 11.5–15.5)
WBC: 8.7 10*3/uL (ref 4.0–10.5)

## 2019-11-30 LAB — BASIC METABOLIC PANEL
BUN: 13 mg/dL (ref 6–23)
CO2: 27 mEq/L (ref 19–32)
Calcium: 9.6 mg/dL (ref 8.4–10.5)
Chloride: 102 mEq/L (ref 96–112)
Creatinine, Ser: 0.78 mg/dL (ref 0.40–1.20)
GFR: 105.56 mL/min (ref >60.00)
Glucose, Bld: 92 mg/dL (ref 70–99)
Potassium: 3.9 mEq/L (ref 3.5–5.1)
Sodium: 137 mEq/L (ref 135–145)

## 2019-11-30 LAB — AST: AST: 18 U/L (ref 0–37)

## 2019-11-30 LAB — LIPID PANEL
Cholesterol: 192 mg/dL (ref 0–200)
HDL: 38.8 mg/dL — ABNORMAL LOW (ref >39.00)
LDL Cholesterol: 123 mg/dL — ABNORMAL HIGH (ref 0–99)
NonHDL: 152.75
Total CHOL/HDL Ratio: 5
Triglycerides: 151 mg/dL — ABNORMAL HIGH (ref 0.0–149.0)
VLDL: 30.2 mg/dL (ref 0.0–40.0)

## 2019-11-30 LAB — ALT: ALT: 17 U/L (ref 0–35)

## 2019-11-30 NOTE — Progress Notes (Signed)
Sharon Townsend is a 29 y.o. female  Chief Complaint  Patient presents with  . Annual Exam    CPE-pt is fasting//has OBGYN referral    HPI: Sharon Townsend is a 29 y.o. female here for annual CPE, fasting labs. Referral previously placed the GYN for routine care and IUD placement.  Last PAP: due and plans to schedule with GYN. Dental: overdue Vision: no vision issues and does not wear glasses or contacts  Diet/Exercise: no regular exercise, diet is well-balanced, good water drinker and drinks mil for calcium     Past Medical History:  Diagnosis Date  . Bipolar disorder ([place])   . Chronic constipation   . Gall bladder stones   . Headache   . Renal disorder    two tubes in left kidney going to bladder    Past Surgical History:  Procedure Laterality Date  . CESAREAN SECTION N/A 06/14/2016   Procedure: CESAREAN SECTION;  Surgeon: Allyn Kenner, DO;  Location: [place];  Service: Obstetrics;  Laterality: N/A;  . child birth  2016  . CHOLECYSTECTOMY    . MANDIBLE SURGERY    . renal stents    . TONSILLECTOMY    . TUBAL LIGATION  06/14/2016    Social History   Socioeconomic History  . Marital status: Married    Spouse name: Not on file  . Number of children: Not on file  . Years of education: Not on file  . Highest education level: Not on file  Occupational History  . Not on file  Tobacco Use  . Smoking status: Never Smoker  . Smokeless tobacco: Never Used  Substance and Sexual Activity  . Alcohol use: No    Comment: before preg  . Drug use: No  . Sexual activity: Yes    Birth control/protection: None    Comment: last sex Mar 9th 2018  Other Topics Concern  . Not on file  Social History Narrative  . Not on file   Social Determinants of Health   Financial Resource Strain:   . Difficulty of Paying Living Expenses: Not on file  Food Insecurity:   . Worried About Charity fundraiser in the Last Year: Not on file  . Ran Out of Food in the  Last Year: Not on file  Transportation Needs:   . Lack of Transportation (Medical): Not on file  . Lack of Transportation (Non-Medical): Not on file  Physical Activity:   . Days of Exercise per Week: Not on file  . Minutes of Exercise per Session: Not on file  Stress:   . Feeling of Stress : Not on file  Social Connections:   . Frequency of Communication with Friends and Family: Not on file  . Frequency of Social Gatherings with Friends and Family: Not on file  . Attends Religious Services: Not on file  . Active Member of Clubs or Organizations: Not on file  . Attends Archivist Meetings: Not on file  . Marital Status: Not on file  Intimate Partner Violence:   . Fear of Current or Ex-Partner: Not on file  . Emotionally Abused: Not on file  . Physically Abused: Not on file  . Sexually Abused: Not on file    Family History  Problem Relation Age of Onset  . Diabetes Mother   . Diabetes Father   . Hypertension Father      Immunization History  Administered Date(s) Administered  . MMR 08/05/2014  . PFIZER SARS-COV-2 Vaccination  06/28/2019, 07/20/2019, 11/23/2019  . Tdap 08/04/2014    Outpatient Encounter Medications as of 11/30/2019  Medication Sig  . cyclobenzaprine (FLEXERIL) 10 MG tablet Take 10 mg by mouth 3 (three) times daily as needed for muscle spasms.  Marland Kitchen EPINEPHrine 0.3 mg/0.3 mL IJ SOAJ injection Inject 0.3 mLs (0.3 mg total) into the muscle as needed for anaphylaxis.  . Melatonin 10 MG TABS Take by mouth.  . naproxen (NAPROSYN) 500 MG tablet Take 1 tablet (500 mg total) by mouth 2 (two) times daily with a meal.  . ondansetron (ZOFRAN ODT) 4 MG disintegrating tablet Take 1 tablet (4 mg total) by mouth every 8 (eight) hours as needed for nausea or vomiting.  . phenazopyridine (PYRIDIUM) 200 MG tablet Take 1 tablet (200 mg total) by mouth 3 (three) times daily. (Patient not taking: Reported on 11/30/2019)  . traMADol (ULTRAM) 50 MG tablet Take 1 tablet (50 mg  total) by mouth every 6 (six) hours as needed. (Patient not taking: Reported on 11/30/2019)   No facility-administered encounter medications on file as of 11/30/2019.     ROS: Gen: no fever, chills  Skin: no rash, itching ENT: no ear pain, ear drainage, nasal congestion, rhinorrhea, sinus pressure, sore throat Eyes: no blurry vision, double vision Resp: no cough, wheeze,SOB CV: no CP, palpitations, LE edema,  GI: no heartburn, n/v/d/c, abd pain GU: no dysuria, urgency, frequency, hematuria MSK: no joint pain, myalgias, back pain Neuro: no dizziness, headache, weakness, vertigo Psych: no depression, anxiety, insomnia   Allergies  Allergen Reactions  . Peanut Oil Anaphylaxis  . Latex Swelling and Other (See Comments)  . Penicillins Other (See Comments)    Reaction:  Pt states that it "shuts down her bowels and cannot urinate" Has patient had a PCN reaction causing immediate rash, facial/tongue/throat swelling, SOB or lightheadedness with hypotension: No Has patient had a PCN reaction causing severe rash involving mucus membranes or skin necrosis: No Has patient had a PCN reaction that required hospitalization No Has patient had a PCN reaction occurring within the last 10 years: No If all of the above answers are "NO", then may proceed with Cephalosp Reaction:  Pt states that it "shuts down her bowel movements". Reaction:  Pt states that it "shuts down her bowels and cannot urinate"     BP 112/82   Pulse 94   Temp 98.6 F (37 C) (Temporal)   Ht 5' 1"  (1.549 m)   Wt 194 lb 12.8 oz (88.4 kg)   SpO2 98%   BMI 36.81 kg/m    Wt Readings from Last 3 Encounters:  11/30/19 194 lb 12.8 oz (88.4 kg)  08/24/19 190 lb (86.2 kg)  07/14/19 191 lb 3.2 oz (86.7 kg)   Physical Exam Constitutional:      General: She is not in acute distress.    Appearance: She is well-developed.  HENT:     Head: Normocephalic and atraumatic.     Right Ear: Tympanic membrane and ear canal normal.      Left Ear: Tympanic membrane and ear canal normal.     Nose: Nose normal.  Eyes:     Conjunctiva/sclera: Conjunctivae normal.     Pupils: Pupils are equal, round, and reactive to light.  Neck:     Thyroid: No thyromegaly.  Cardiovascular:     Rate and Rhythm: Normal rate and regular rhythm.     Heart sounds: Normal heart sounds. No murmur heard.   Pulmonary:     Effort: Pulmonary effort is  normal. No respiratory distress.     Breath sounds: Normal breath sounds. No wheezing or rhonchi.  Abdominal:     General: Bowel sounds are normal. There is no distension.     Palpations: Abdomen is soft. There is no mass.     Tenderness: There is no abdominal tenderness.  Musculoskeletal:     Cervical back: Neck supple.  Lymphadenopathy:     Cervical: No cervical adenopathy.  Skin:    General: Skin is warm and dry.  Neurological:     Mental Status: She is alert and oriented to person, place, and time.     Motor: No abnormal muscle tone.     Coordination: Coordination normal.  Psychiatric:        Behavior: Behavior normal.      A/P:  1. Annual physical exam - discussed importance of regular CV exercise, healthy diet, adequate sleep - due for dental exam and encouraged pt to schedule - due for PAP and pt wants IUD, referral previously placed to GYN and pt needs to schedule - ALT - AST - Basic metabolic panel - CBC - Lipid panel - next CPE in 1 year   This visit occurred during the SARS-CoV-2 public health emergency.  Safety protocols were in place, including screening questions prior to the visit, additional usage of staff PPE, and extensive cleaning of exam room while observing appropriate contact time as indicated for disinfecting solutions.

## 2019-12-06 MED ORDER — ONDANSETRON 4 MG PO TBDP: 4.0000 mg | ORAL_TABLET | Freq: Three times a day (TID) | ORAL | 0 refills | Status: DC | PRN

## 2019-12-18 ENCOUNTER — Other Ambulatory Visit: Payer: Self-pay

## 2019-12-18 ENCOUNTER — Ambulatory Visit (INDEPENDENT_AMBULATORY_CARE_PROVIDER_SITE_OTHER): Payer: 59 | Admitting: Obstetrics & Gynecology

## 2019-12-18 ENCOUNTER — Other Ambulatory Visit (HOSPITAL_COMMUNITY)
Admission: RE | Admit: 2019-12-18 | Discharge: 2019-12-18 | Disposition: A | Discharge status: Home / Self Care | Payer: 59 | Source: Ambulatory Visit | Attending: Obstetrics & Gynecology | Admitting: Obstetrics & Gynecology

## 2019-12-18 ENCOUNTER — Encounter: Payer: Self-pay | Admitting: Obstetrics & Gynecology

## 2019-12-18 VITALS — BP 132/87 | HR 69 | Ht 60.0 in | Wt 191.3 lb

## 2019-12-18 DIAGNOSIS — N941 Unspecified dyspareunia: Secondary | ICD-10-CM

## 2019-12-18 DIAGNOSIS — Z8041 Family history of malignant neoplasm of ovary: Secondary | ICD-10-CM | POA: Insufficient documentation

## 2019-12-18 DIAGNOSIS — N920 Excessive and frequent menstruation with regular cycle: Secondary | ICD-10-CM | POA: Diagnosis not present

## 2019-12-18 DIAGNOSIS — Z803 Family history of malignant neoplasm of breast: Secondary | ICD-10-CM | POA: Diagnosis not present

## 2019-12-18 DIAGNOSIS — Z01419 Encounter for gynecological examination (general) (routine) without abnormal findings: Secondary | ICD-10-CM | POA: Diagnosis not present

## 2019-12-18 NOTE — Patient Instructions (Signed)
Pelvic Pain, Female Pelvic pain is pain in your lower abdomen, below your belly button and between your hips. The pain may start suddenly (be acute), keep coming back (be recurring), or last a long time (become chronic). Pelvic pain that lasts longer than 6 months is considered chronic. Pelvic pain may affect your:  Reproductive organs.  Urinary system.  Digestive tract.  Musculoskeletal system. There are many potential causes of pelvic pain. Sometimes, the pain can be a result of digestive or urinary conditions, strained muscles or ligaments, or reproductive conditions. Sometimes the cause of pelvic pain is not known. Follow these instructions at home:   Take over-the-counter and prescription medicines only as told by your health care provider.  Rest as told by your health care provider.  Do not have sex if it hurts.  Keep a journal of your pelvic pain. Write down: ? When the pain started. ? Where the pain is located. ? What seems to make the pain better or worse, such as food or your period (menstrual cycle). ? Any symptoms you have along with the pain.  Keep all follow-up visits as told by your health care provider. This is important. Contact a health care provider if:  Medicine does not help your pain.  Your pain comes back.  You have new symptoms.  You have abnormal vaginal discharge or bleeding, including bleeding after menopause.  You have a fever or chills.  You are constipated.  You have blood in your urine or stool.  You have foul-smelling urine.  You feel weak or light-headed. Get help right away if:  You have sudden severe pain.  Your pain gets steadily worse.  You have severe pain along with fever, nausea, vomiting, or excessive sweating.  You lose consciousness. Summary  Pelvic pain is pain in your lower abdomen, below your belly button and between your hips.  There are many potential causes of pelvic pain.  Keep a journal of your pelvic  pain. This information is not intended to replace advice given to you by your health care provider. Make sure you discuss any questions you have with your health care provider. Document Revised: 09/01/2017 Document Reviewed: 09/01/2017 Elsevier Patient Education  2020 Elsevier Inc.  

## 2019-12-18 NOTE — Progress Notes (Signed)
NGYN patient presents for [facility].   LMP:12/13/19 usually 7-8 days Heavy flow with clots.  Last Pap: 3 yrs ago per pt WNL at  [facility] / GYN  STD Screening: Declined  Family Hx of Breast Cancer:  MGM ,PGM  Mother at age 29.  Family Hx of Ovarian Cancer: PGM   Last had unprotected intercourse: 11/10/19  CC:  Pt states since having her daughter (age 93)  her cycles are very painful and heavy with clots now using cups and will fill cup in 4hrs when pt notes hairl oss and weight loss with birthcontrol in the past  Pt has tried Depo, pills Nexplanon pt also did not like irregular bleeding with contraception with Depo.  PT also has pain with intercourse.

## 2019-12-18 NOTE — Progress Notes (Signed)
Patient ID: [name], female   DOB: 1990-11-15, 29 y.o.   MRN: [id number]  Chief Complaint  Patient presents with  . Gynecologic Exam    HPI Sharon Townsend is a 29 y.o. female.  [id number] Patient's last menstrual period was 11/12/2019. S/p BTL with Filshie clips at the time of cesarean section in 2018. Since then her menses are regular but heavy and may last for 8 days. She has cramps and dyspareunia. Some urinary urge and frequency but no dysuria. HPI  Past Medical History:  Diagnosis Date  . Bipolar disorder ([place])   . Chronic constipation   . Gall bladder stones   . Headache   . Renal disorder    two tubes in left kidney going to bladder    Past Surgical History:  Procedure Laterality Date  . CESAREAN SECTION N/A 06/14/2016   Procedure: CESAREAN SECTION;  Surgeon: Allyn Kenner, DO;  Location: [place];  Service: Obstetrics;  Laterality: N/A;  . child birth  2016  . CHOLECYSTECTOMY    . MANDIBLE SURGERY    . renal stents    . TONSILLECTOMY    . TUBAL LIGATION  06/14/2016    Family History  Problem Relation Age of Onset  . Diabetes Mother   . Breast cancer Mother 41  . Diabetes Father   . Hypertension Father   . Breast cancer Maternal Grandmother   . Ovarian cancer Paternal Grandmother     Social History Social History   Tobacco Use  . Smoking status: Never Smoker  . Smokeless tobacco: Never Used  Vaping Use  . Vaping Use: Never used  Substance Use Topics  . Alcohol use: No    Comment: before preg  . Drug use: No    Allergies  Allergen Reactions  . Peanut Oil Anaphylaxis  . Latex Swelling and Other (See Comments)  . Penicillins Other (See Comments)    Reaction:  Pt states that it "shuts down her bowels and cannot urinate" Has patient had a PCN reaction causing immediate rash, facial/tongue/throat swelling, SOB or lightheadedness with hypotension: No Has patient had a PCN reaction causing severe rash involving mucus membranes or  skin necrosis: No Has patient had a PCN reaction that required hospitalization No Has patient had a PCN reaction occurring within the last 10 years: No If all of the above answers are "NO", then may proceed with Cephalosp Reaction:  Pt states that it "shuts down her bowel movements". Reaction:  Pt states that it "shuts down her bowels and cannot urinate"     Current Outpatient Medications  Medication Sig Dispense Refill  . EPINEPHrine 0.3 mg/0.3 mL IJ SOAJ injection Inject 0.3 mLs (0.3 mg total) into the muscle as needed for anaphylaxis. 1 each 1  . Melatonin 10 MG TABS Take by mouth.    . naproxen (NAPROSYN) 500 MG tablet Take 1 tablet (500 mg total) by mouth 2 (two) times daily with a meal. 30 tablet 0  . ondansetron (ZOFRAN ODT) 4 MG disintegrating tablet Take 1 tablet (4 mg total) by mouth every 8 (eight) hours as needed for nausea or vomiting. 30 tablet 0  . cyclobenzaprine (FLEXERIL) 10 MG tablet Take 10 mg by mouth 3 (three) times daily as needed for muscle spasms. (Patient not taking: Reported on 12/18/2019)     No current facility-administered medications for this visit.    Review of Systems Review of Systems  Constitutional: Negative.   Respiratory: Negative.   Genitourinary: Positive for dyspareunia (pelvic),  frequency, menstrual problem and pelvic pain. Negative for dysuria, vaginal discharge and vaginal pain.    Blood pressure 132/87, pulse 69, height 5' (1.524 m), weight 191 lb 4.8 oz (86.8 kg), last menstrual period 11/12/2019.  Physical Exam Physical Exam Vitals and nursing note reviewed. Exam conducted with a chaperone present.  Constitutional:      Appearance: Normal appearance. She is not ill-appearing.  HENT:     Head: Normocephalic.  Pulmonary:     Effort: Pulmonary effort is normal.  Abdominal:     Palpations: Abdomen is soft. There is no mass.     Tenderness: There is no abdominal tenderness.  Genitourinary:    General: Normal vulva.     Vagina: No  vaginal discharge.     Comments: Pelvic exam: normal external genitalia, vulva, vagina, cervix, uterus and adnexa. CMT and right adnexal tenderness no mass Neurological:     Mental Status: She is alert.   Breasts: breasts appear normal, no suspicious masses, no skin or nipple changes or axillary nodes.  Data Reviewed Op note 2018  Assessment Well woman exam with routine gynecological exam - Plan: POCT urine pregnancy, Cervicovaginal ancillary only( [place]), Cytology - PAP( [place])  Menorrhagia with regular cycle - Plan: US PELVIC COMPLETE WITH TRANSVAGINAL  Dyspareunia in female - Plan: US PELVIC COMPLETE WITH TRANSVAGINAL  Family history of breast cancer  Family history of ovarian cancer    Plan [place] scheduled RTC 4 weeks Offered BRCA screening      Emeterio Reeve 12/18/2019, 11:34 AM

## 2019-12-19 LAB — CYTOLOGY - PAP: Diagnosis: NEGATIVE

## 2019-12-22 ENCOUNTER — Encounter: Payer: Self-pay | Admitting: Family Medicine

## 2019-12-27 ENCOUNTER — Ambulatory Visit: Admission: RE | Admit: 2019-12-27 | Payer: 59 | Source: Ambulatory Visit

## 2020-01-03 ENCOUNTER — Other Ambulatory Visit: Payer: Self-pay

## 2020-01-03 ENCOUNTER — Ambulatory Visit
Admission: RE | Admit: 2020-01-03 | Discharge: 2020-01-03 | Disposition: A | Discharge status: Home / Self Care | Payer: 59 | Source: Ambulatory Visit | Attending: Obstetrics & Gynecology | Admitting: Obstetrics & Gynecology

## 2020-01-03 DIAGNOSIS — N920 Excessive and frequent menstruation with regular cycle: Secondary | ICD-10-CM

## 2020-01-03 DIAGNOSIS — N941 Unspecified dyspareunia: Secondary | ICD-10-CM | POA: Insufficient documentation

## 2020-01-04 ENCOUNTER — Telehealth: Payer: Self-pay

## 2020-01-04 NOTE — Telephone Encounter (Signed)
Please review patient results she is not able to keep appointment next week.

## 2020-01-10 ENCOUNTER — Encounter: Payer: Self-pay | Admitting: Obstetrics & Gynecology

## 2020-01-10 ENCOUNTER — Ambulatory Visit (INDEPENDENT_AMBULATORY_CARE_PROVIDER_SITE_OTHER): Payer: 59 | Admitting: Obstetrics & Gynecology

## 2020-01-10 ENCOUNTER — Other Ambulatory Visit: Payer: Self-pay

## 2020-01-10 VITALS — BP 119/80 | HR 85 | Wt 193.0 lb

## 2020-01-10 DIAGNOSIS — N920 Excessive and frequent menstruation with regular cycle: Secondary | ICD-10-CM

## 2020-01-10 DIAGNOSIS — N941 Unspecified dyspareunia: Secondary | ICD-10-CM

## 2020-01-10 NOTE — Patient Instructions (Signed)
Levonorgestrel intrauterine device (IUD) What is this medicine? LEVONORGESTREL IUD (LEE voe nor jes trel) is a contraceptive (birth control) device. The device is placed inside the uterus by a healthcare professional. It is used to prevent pregnancy. This device can also be used to treat heavy bleeding that occurs during your period. This medicine may be used for other purposes; ask your health care provider or pharmacist if you have questions. COMMON BRAND NAME(S): Kyleena, LILETTA, Mirena, Skyla What should I tell my health care provider before I take this medicine? They need to know if you have any of these conditions:  abnormal Pap smear  cancer of the breast, uterus, or cervix  diabetes  endometritis  genital or pelvic infection now or in the past  have more than one sexual partner or your partner has more than one partner  heart disease  history of an ectopic or tubal pregnancy  immune system problems  IUD in place  liver disease or tumor  problems with blood clots or take blood-thinners  seizures  use intravenous drugs  uterus of unusual shape  vaginal bleeding that has not been explained  an unusual or allergic reaction to levonorgestrel, other hormones, silicone, or polyethylene, medicines, foods, dyes, or preservatives  pregnant or trying to get pregnant  breast-feeding How should I use this medicine? This device is placed inside the uterus by a health care professional. Talk to your pediatrician regarding the use of this medicine in children. Special care may be needed. Overdosage: If you think you have taken too much of this medicine contact a poison control center or emergency room at once. NOTE: This medicine is only for you. Do not share this medicine with others. What if I miss a dose? This does not apply. Depending on the brand of device you have inserted, the device will need to be replaced every 3 to 6 years if you wish to continue using this type  of birth control. What may interact with this medicine? Do not take this medicine with any of the following medications:  amprenavir  bosentan  fosamprenavir This medicine may also interact with the following medications:  aprepitant  armodafinil  barbiturate medicines for inducing sleep or treating seizures  bexarotene  boceprevir  griseofulvin  medicines to treat seizures like carbamazepine, ethotoin, felbamate, oxcarbazepine, phenytoin, topiramate  modafinil  pioglitazone  rifabutin  rifampin  rifapentine  some medicines to treat HIV infection like atazanavir, efavirenz, indinavir, lopinavir, nelfinavir, tipranavir, ritonavir  St. John's wort  warfarin This list may not describe all possible interactions. Give your health care provider a list of all the medicines, herbs, non-prescription drugs, or dietary supplements you use. Also tell them if you smoke, drink alcohol, or use illegal drugs. Some items may interact with your medicine. What should I watch for while using this medicine? Visit your doctor or health care professional for regular check ups. See your doctor if you or your partner has sexual contact with others, becomes HIV positive, or gets a sexual transmitted disease. This product does not protect you against HIV infection (AIDS) or other sexually transmitted diseases. You can check the placement of the IUD yourself by reaching up to the top of your vagina with clean fingers to feel the threads. Do not pull on the threads. It is a good habit to check placement after each menstrual period. Call your doctor right away if you feel more of the IUD than just the threads or if you cannot feel the threads at   all. The IUD may come out by itself. You may become pregnant if the device comes out. If you notice that the IUD has come out use a backup birth control method like condoms and call your health care provider. Using tampons will not change the position of the  IUD and are okay to use during your period. This IUD can be safely scanned with magnetic resonance imaging (MRI) only under specific conditions. Before you have an MRI, tell your healthcare provider that you have an IUD in place, and which type of IUD you have in place. What side effects may I notice from receiving this medicine? Side effects that you should report to your doctor or health care professional as soon as possible:  allergic reactions like skin rash, itching or hives, swelling of the face, lips, or tongue  fever, flu-like symptoms  genital sores  high blood pressure  no menstrual period for 6 weeks during use  pain, swelling, warmth in the leg  pelvic pain or tenderness  severe or sudden headache  signs of pregnancy  stomach cramping  sudden shortness of breath  trouble with balance, talking, or walking  unusual vaginal bleeding, discharge  yellowing of the eyes or skin Side effects that usually do not require medical attention (report to your doctor or health care professional if they continue or are bothersome):  acne  breast pain  change in sex drive or performance  changes in weight  cramping, dizziness, or faintness while the device is being inserted  headache  irregular menstrual bleeding within first 3 to 6 months of use  nausea This list may not describe all possible side effects. Call your doctor for medical advice about side effects. You may report side effects to FDA at 1-800-FDA-1088. Where should I keep my medicine? This does not apply. NOTE: This sheet is a summary. It may not cover all possible information. If you have questions about this medicine, talk to your doctor, pharmacist, or health care provider.  2020 Elsevier/Gold Standard (2018-01-25 13:22:01) Endometrial Ablation Endometrial ablation is a procedure that destroys the thin inner layer of the lining of the uterus (endometrium). This procedure may be done:  To stop heavy  periods.  To stop bleeding that is causing anemia.  To control irregular bleeding.  To treat bleeding caused by small tumors (fibroids) in the endometrium. This procedure is often an alternative to major surgery, such as removal of the uterus and cervix (hysterectomy). As a result of this procedure:  You may not be able to have children. However, if you are premenopausal (you have not gone through menopause): ? You may still have a small chance of getting pregnant. ? You will need to use a reliable method of birth control after the procedure to prevent pregnancy.  You may stop having a menstrual period, or you may have only a small amount of bleeding during your period. Menstruation may return several years after the procedure. Tell a health care provider about:  Any allergies you have.  All medicines you are taking, including vitamins, herbs, eye drops, creams, and over-the-counter medicines.  Any problems you or family members have had with the use of anesthetic medicines.  Any blood disorders you have.  Any surgeries you have had.  Any medical conditions you have. What are the risks? Generally, this is a safe procedure. However, problems may occur, including:  A hole (perforation) in the uterus or bowel.  Infection of the uterus, bladder, or vagina.  Bleeding.    Damage to other structures or organs.  An air bubble in the lung (air embolus).  Problems with pregnancy after the procedure.  Failure of the procedure.  Decreased ability to diagnose cancer in the endometrium. What happens before the procedure?  You will have tests of your endometrium to make sure there are no pre-cancerous cells or cancer cells present.  You may have an ultrasound of the uterus.  You may be given medicines to thin the endometrium.  Ask your health care provider about: ? Changing or stopping your regular medicines. This is especially important if you take diabetes medicines or blood  thinners. ? Taking medicines such as aspirin and ibuprofen. These medicines can thin your blood. Do not take these medicines before your procedure if your doctor tells you not to.  Plan to have someone take you home from the hospital or clinic. What happens during the procedure?   You will lie on an exam table with your feet and legs supported as in a pelvic exam.  To lower your risk of infection: ? Your health care team will wash or sanitize their hands and put on germ-free (sterile) gloves. ? Your genital area will be washed with soap.  An IV tube will be inserted into one of your veins.  You will be given a medicine to help you relax (sedative).  A surgical instrument with a light and camera (resectoscope) will be inserted into your vagina and moved into your uterus. This allows your surgeon to see inside your uterus.  Endometrial tissue will be removed using one of the following methods: ? Radiofrequency. This method uses a radiofrequency-alternating electric current to remove the endometrium. ? Cryotherapy. This method uses extreme cold to freeze the endometrium. ? Heated-free liquid. This method uses a heated saltwater (saline) solution to remove the endometrium. ? Microwave. This method uses high-energy microwaves to heat up the endometrium and remove it. ? Thermal balloon. This method involves inserting a catheter with a balloon tip into the uterus. The balloon tip is filled with heated fluid to remove the endometrium. The procedure may vary among health care providers and hospitals. What happens after the procedure?  Your blood pressure, heart rate, breathing rate, and blood oxygen level will be monitored until the medicines you were given have worn off.  As tissue healing occurs, you may notice vaginal bleeding for 4-6 weeks after the procedure. You may also experience: ? Cramps. ? Thin, watery vaginal discharge that is light pink or brown in color. ? A need to urinate more  frequently than usual. ? Nausea.  Do not drive for 24 hours if you were given a sedative.  Do not have sex or insert anything into your vagina until your health care provider approves. Summary  Endometrial ablation is done to treat the many causes of heavy menstrual bleeding.  The procedure may be done only after medications have been tried to control the bleeding.  Plan to have someone take you home from the hospital or clinic. This information is not intended to replace advice given to you by your health care provider. Make sure you discuss any questions you have with your health care provider. Document Revised: 08/31/2017 Document Reviewed: 04/02/2016 Elsevier Patient Education  2020 Elsevier Inc.  

## 2020-01-10 NOTE — Progress Notes (Signed)
RGYN pt present for F/U visit today.   Pt is here to discuss results from U/S from 01/03/2020.  Pt does not want contraception.

## 2020-01-10 NOTE — Progress Notes (Signed)
Patient ID: Sharon Townsend, female   DOB: 20-Jan-1991, 29 y.o.   MRN: [id number] Ultrasounds Results Note  SUBJECTIVE HPI:  Sharon Townsend is a 29 y.o. [id number]  followup ultrasound results. The patient denies abdominal pain or vaginal bleeding.  She has regular heavy menses despite taking ibuprofen before and during her period.She declines systemic hormonal cycle control. She is s/p BTL  Past Medical History:  Diagnosis Date  . Bipolar disorder (HCC)   . Chronic constipation   . Gall bladder stones   . Headache   . Renal disorder    two tubes in left kidney going to bladder   Past Surgical History:  Procedure Laterality Date  . CESAREAN SECTION N/A 06/14/2016   Procedure: CESAREAN SECTION;  Surgeon: Philip Aspen, DO;  Location: [facility];  Service: Obstetrics;  Laterality: N/A;  . child birth  2016  . CHOLECYSTECTOMY    . MANDIBLE SURGERY    . renal stents    . TONSILLECTOMY    . TUBAL LIGATION  06/14/2016   Social History   Socioeconomic History  . Marital status: Married    Spouse name: Not on file  . Number of children: Not on file  . Years of education: Not on file  . Highest education level: Not on file  Occupational History  . Not on file  Tobacco Use  . Smoking status: Never Smoker  . Smokeless tobacco: Never Used  Vaping Use  . Vaping Use: Never used  Substance and Sexual Activity  . Alcohol use: No    Comment: before preg  . Drug use: No  . Sexual activity: Yes    Partners: Male    Birth control/protection: None  Other Topics Concern  . Not on file  Social History Narrative  . Not on file   Social Determinants of Health   Financial Resource Strain:   . Difficulty of Paying Living Expenses: Not on file  Food Insecurity:   . Worried About Programme researcher, broadcasting/film/video in the Last Year: Not on file  . Ran Out of Food in the Last Year: Not on file  Transportation Needs:   . Lack of Transportation (Medical): Not on file  . Lack of  Transportation (Non-Medical): Not on file  Physical Activity:   . Days of Exercise per Week: Not on file  . Minutes of Exercise per Session: Not on file  Stress:   . Feeling of Stress : Not on file  Social Connections:   . Frequency of Communication with Friends and Family: Not on file  . Frequency of Social Gatherings with Friends and Family: Not on file  . Attends Religious Services: Not on file  . Active Member of Clubs or Organizations: Not on file  . Attends Banker Meetings: Not on file  . Marital Status: Not on file  Intimate Partner Violence:   . Fear of Current or Ex-Partner: Not on file  . Emotionally Abused: Not on file  . Physically Abused: Not on file  . Sexually Abused: Not on file   Current Outpatient Medications on File Prior to Visit  Medication Sig Dispense Refill  . cyclobenzaprine (FLEXERIL) 10 MG tablet Take 10 mg by mouth 3 (three) times daily as needed for muscle spasms. (Patient not taking: Reported on 12/18/2019)    . EPINEPHrine 0.3 mg/0.3 mL IJ SOAJ injection Inject 0.3 mLs (0.3 mg total) into the muscle as needed for anaphylaxis. 1 each 1  . Melatonin 10 MG  TABS Take by mouth.    . naproxen (NAPROSYN) 500 MG tablet Take 1 tablet (500 mg total) by mouth 2 (two) times daily with a meal. 30 tablet 0  . ondansetron (ZOFRAN ODT) 4 MG disintegrating tablet Take 1 tablet (4 mg total) by mouth every 8 (eight) hours as needed for nausea or vomiting. 30 tablet 0   No current facility-administered medications on file prior to visit.   Allergies  Allergen Reactions  . Peanut Oil Anaphylaxis  . Latex Swelling and Other (See Comments)  . Penicillins Other (See Comments)    Reaction:  Pt states that it "shuts down her bowels and cannot urinate" Has patient had a PCN reaction causing immediate rash, facial/tongue/throat swelling, SOB or lightheadedness with hypotension: No Has patient had a PCN reaction causing severe rash involving mucus membranes or skin  necrosis: No Has patient had a PCN reaction that required hospitalization No Has patient had a PCN reaction occurring within the last 10 years: No If all of the above answers are "NO", then may proceed with Cephalosp Reaction:  Pt states that it "shuts down her bowel movements". Reaction:  Pt states that it "shuts down her bowels and cannot urinate"     I have reviewed patient's Past Medical Hx, Surgical Hx, Family Hx, Social Hx, medications and allergies.   Review of Systems Review of Systems  Constitutional: Negative for fever and chills.  Gastrointestinal: Negative for nausea, vomiting, abdominal pain, diarrhea and constipation.  Genitourinary: Negative for dysuria.  Musculoskeletal: Negative for back pain.  Neurological: Negative for dizziness and weakness.    Physical Exam  BP 119/80   Pulse 85   Wt 193 lb (87.5 kg)   LMP 12/13/2019 (Exact Date)   BMI 37.69 kg/m   GENERAL: Well-developed, well-nourished female in no acute distress.  HEENT: Normocephalic, atraumatic.   LUNGS: Effort normal ABDOMEN: soft, non-tender HEART: Regular rate  SKIN: Warm, dry and without erythema PSYCH: Normal mood and affect NEURO: Alert and oriented x 4  LAB RESULTS No results found for this or any previous visit (from the past 24 hour(s)).  IMAGING US PELVIC COMPLETE WITH TRANSVAGINAL  Result Date: 01/03/2020 CLINICAL DATA:  Menorrhagia with regular cycles and dyspareunia with occlusive devices. Previous bilateral tubal ligation and C-section. Gravida 4 para 3. EXAM: TRANSABDOMINAL AND TRANSVAGINAL ULTRASOUND OF PELVIS TECHNIQUE: Both transabdominal and transvaginal ultrasound examinations of the pelvis were performed. Transabdominal technique was performed for global imaging of the pelvis including uterus, ovaries, adnexal regions, and pelvic cul-de-sac. It was necessary to proceed with endovaginal exam following the transabdominal exam to visualize the endometrium and ovaries. COMPARISON:   02/27/2016 FINDINGS: Uterus Measurements: 9.8 x 3.9 x 4.9 centimeters = volume: 98.4 mL. Heterogeneous, "Venetian blind" appearance of the myometrium without discrete mass. Endometrium Thickness: Estimated to be 7.8 millimeters. Margins are poorly defined. No focal abnormality visualized. Right ovary Measurements: 4.0 x 2.8 x 2.5 centimeters = volume: 14.4 mL. Normal appearance/no adnexal mass. Left ovary Measurements: 3.5 x 1.6 x 2.1 centimeters = volume: 5.8 mL. Normal appearance/no adnexal mass. Other findings Trace free pelvic fluid. IMPRESSION: 1. Heterogeneous, "Venetian blind" appearance of the myometrium, suspicious for adenomyosis. 2. No adnexal mass. 3. No endometrial mass. Electronically Signed   By: Norva Pavlov M.D.   On: 01/03/2020 11:56    ASSESSMENT No diagnosis found.  PLAN Discharge home in stable condition  Offered Lysteda, Mirena, possible endometrial ablation and I gave her information, stressed the safety and effectiveness if Regina Eck,  Currie Paris, MD  01/10/2020  3:02 PM

## 2020-01-29 ENCOUNTER — Encounter: Payer: Self-pay | Admitting: Obstetrics & Gynecology

## 2020-01-29 ENCOUNTER — Ambulatory Visit (INDEPENDENT_AMBULATORY_CARE_PROVIDER_SITE_OTHER): Payer: 59 | Admitting: Obstetrics & Gynecology

## 2020-01-29 ENCOUNTER — Other Ambulatory Visit: Payer: Self-pay

## 2020-01-29 VITALS — BP 117/82 | HR 79 | Ht 60.0 in | Wt 192.4 lb

## 2020-01-29 DIAGNOSIS — N920 Excessive and frequent menstruation with regular cycle: Secondary | ICD-10-CM

## 2020-01-29 NOTE — Progress Notes (Signed)
Pt is in the office to discuss possible IUD or ablation. Pt reports menstrual cycles lasting up to 3 weeks and causing heavy bleeding and bad cramping. She was seen 5 weeks ago and we discussed her options. She indicates she would like to schedule ablation procedure. I sent a request to schedule. Patient desires surgical management .  The risks of surgery were discussed in detail with the patient including but not limited to: bleeding which may require transfusion or reoperation; infection which may require prolonged hospitalization or re-hospitalization and antibiotic therapy; injury to bowel, bladder, ureters and major vessels or other surrounding organs; formation of adhesions; need for additional procedures including laparotomy or subsequent procedures secondary to abnormal pathology; thromboembolic phenomenon; incisional problems and other postoperative or anesthesia complications.  Patient was told that the likelihood that her condition and symptoms will be treated effectively with this surgical management was very high; the postoperative expectations were also discussed in detail. The patient also understands the alternative treatment options which were discussed in full. All questions were answered.  She was told that she will be contacted by our surgical scheduler regarding the time and date of her surgery; routine preoperative instructions will be given to her by the preoperative nursing team.   She is aware of need for preoperative COVID testing and subsequent quarantine from time of test to time of surgery; she will be given further preoperative instructions at that COVID screening visit.  Printed patient education handouts about the procedure were given to the patient to review at home. RTC for endometrial biopsy. Informed to take ibuprofen prior to her visit.  Adam Phenix, MD 01/29/2020

## 2020-01-29 NOTE — Patient Instructions (Signed)
Endometrial Ablation  Endometrial ablation is a procedure that destroys the thin inner layer of the lining of the uterus (endometrium). This procedure may be done:  To stop heavy periods.  To stop bleeding that is causing anemia.  To control irregular bleeding.  To treat bleeding caused by small tumors (fibroids) in the endometrium.  This procedure is often an alternative to major surgery, such as removal of the uterus and cervix (hysterectomy). As a result of this procedure:  You may not be able to have children. However, if you are premenopausal (you have not gone through menopause):  You may still have a small chance of getting pregnant.  You will need to use a reliable method of birth control after the procedure to prevent pregnancy.  You may stop having a menstrual period, or you may have only a small amount of bleeding during your period. Menstruation may return several years after the procedure.  Tell a health care provider about:  Any allergies you have.  All medicines you are taking, including vitamins, herbs, eye drops, creams, and over-the-counter medicines.  Any problems you or family members have had with the use of anesthetic medicines.  Any blood disorders you have.  Any surgeries you have had.  Any medical conditions you have.  What are the risks?  Generally, this is a safe procedure. However, problems may occur, including:  A hole (perforation) in the uterus or bowel.  Infection of the uterus, bladder, or vagina.  Bleeding.  Damage to other structures or organs.  An air bubble in the lung (air embolus).  Problems with pregnancy after the procedure.  Failure of the procedure.  Decreased ability to diagnose cancer in the endometrium.  What happens before the procedure?  You will have tests of your endometrium to make sure there are no pre-cancerous cells or cancer cells present.  You may have an ultrasound of the uterus.  You may be given medicines to thin the endometrium.  Ask your health care  provider about:  Changing or stopping your regular medicines. This is especially important if you take diabetes medicines or blood thinners.  Taking medicines such as aspirin and ibuprofen. These medicines can thin your blood. Do not take these medicines before your procedure if your doctor tells you not to.  Plan to have someone take you home from the hospital or clinic.  What happens during the procedure?    You will lie on an exam table with your feet and legs supported as in a pelvic exam.  To lower your risk of infection:  Your health care team will wash or sanitize their hands and put on germ-free (sterile) gloves.  Your genital area will be washed with soap.  An IV tube will be inserted into one of your veins.  You will be given a medicine to help you relax (sedative).  A surgical instrument with a light and camera (resectoscope) will be inserted into your vagina and moved into your uterus. This allows your surgeon to see inside your uterus.  Endometrial tissue will be removed using one of the following methods:  Radiofrequency. This method uses a radiofrequency-alternating electric current to remove the endometrium.  Cryotherapy. This method uses extreme cold to freeze the endometrium.  Heated-free liquid. This method uses a heated saltwater (saline) solution to remove the endometrium.  Microwave. This method uses high-energy microwaves to heat up the endometrium and remove it.  Thermal balloon. This method involves inserting a catheter with a balloon tip into   the uterus. The balloon tip is filled with heated fluid to remove the endometrium.  The procedure may vary among health care providers and hospitals.  What happens after the procedure?  Your blood pressure, heart rate, breathing rate, and blood oxygen level will be monitored until the medicines you were given have worn off.  As tissue healing occurs, you may notice vaginal bleeding for 4-6 weeks after the procedure. You may also  experience:  Cramps.  Thin, watery vaginal discharge that is light pink or brown in color.  A need to urinate more frequently than usual.  Nausea.  Do not drive for 24 hours if you were given a sedative.  Do not have sex or insert anything into your vagina until your health care provider approves.  Summary  Endometrial ablation is done to treat the many causes of heavy menstrual bleeding.  The procedure may be done only after medications have been tried to control the bleeding.  Plan to have someone take you home from the hospital or clinic.  This information is not intended to replace advice given to you by your health care provider. Make sure you discuss any questions you have with your health care provider.  Document Revised: 08/31/2017 Document Reviewed: 04/02/2016  Elsevier Patient Education  2020 [employer]

## 2020-02-08 ENCOUNTER — Other Ambulatory Visit: Payer: Self-pay

## 2020-02-08 ENCOUNTER — Ambulatory Visit (INDEPENDENT_AMBULATORY_CARE_PROVIDER_SITE_OTHER): Payer: 59 | Admitting: Obstetrics & Gynecology

## 2020-02-08 ENCOUNTER — Encounter: Payer: Self-pay | Admitting: Obstetrics & Gynecology

## 2020-02-08 ENCOUNTER — Other Ambulatory Visit (HOSPITAL_COMMUNITY)
Admission: RE | Admit: 2020-02-08 | Discharge: 2020-02-08 | Disposition: A | Discharge status: Home / Self Care | Payer: 59 | Source: Ambulatory Visit | Attending: Obstetrics & Gynecology | Admitting: Obstetrics & Gynecology

## 2020-02-08 VITALS — BP 126/82 | HR 82 | Wt 193.0 lb

## 2020-02-08 DIAGNOSIS — N920 Excessive and frequent menstruation with regular cycle: Secondary | ICD-10-CM

## 2020-02-08 LAB — POCT URINE PREGNANCY: Preg Test, Ur: NEGATIVE

## 2020-02-08 NOTE — Patient Instructions (Signed)
Endometrial Ablation  Endometrial ablation is a procedure that destroys the thin inner layer of the lining of the uterus (endometrium). This procedure may be done:  To stop heavy periods.  To stop bleeding that is causing anemia.  To control irregular bleeding.  To treat bleeding caused by small tumors (fibroids) in the endometrium.  This procedure is often an alternative to major surgery, such as removal of the uterus and cervix (hysterectomy). As a result of this procedure:  You may not be able to have children. However, if you are premenopausal (you have not gone through menopause):  You may still have a small chance of getting pregnant.  You will need to use a reliable method of birth control after the procedure to prevent pregnancy.  You may stop having a menstrual period, or you may have only a small amount of bleeding during your period. Menstruation may return several years after the procedure.  Tell a health care provider about:  Any allergies you have.  All medicines you are taking, including vitamins, herbs, eye drops, creams, and over-the-counter medicines.  Any problems you or family members have had with the use of anesthetic medicines.  Any blood disorders you have.  Any surgeries you have had.  Any medical conditions you have.  What are the risks?  Generally, this is a safe procedure. However, problems may occur, including:  A hole (perforation) in the uterus or bowel.  Infection of the uterus, bladder, or vagina.  Bleeding.  Damage to other structures or organs.  An air bubble in the lung (air embolus).  Problems with pregnancy after the procedure.  Failure of the procedure.  Decreased ability to diagnose cancer in the endometrium.  What happens before the procedure?  You will have tests of your endometrium to make sure there are no pre-cancerous cells or cancer cells present.  You may have an ultrasound of the uterus.  You may be given medicines to thin the endometrium.  Ask your health care  provider about:  Changing or stopping your regular medicines. This is especially important if you take diabetes medicines or blood thinners.  Taking medicines such as aspirin and ibuprofen. These medicines can thin your blood. Do not take these medicines before your procedure if your doctor tells you not to.  Plan to have someone take you home from the hospital or clinic.  What happens during the procedure?    You will lie on an exam table with your feet and legs supported as in a pelvic exam.  To lower your risk of infection:  Your health care team will wash or sanitize their hands and put on germ-free (sterile) gloves.  Your genital area will be washed with soap.  An IV tube will be inserted into one of your veins.  You will be given a medicine to help you relax (sedative).  A surgical instrument with a light and camera (resectoscope) will be inserted into your vagina and moved into your uterus. This allows your surgeon to see inside your uterus.  Endometrial tissue will be removed using one of the following methods:  Radiofrequency. This method uses a radiofrequency-alternating electric current to remove the endometrium.  Cryotherapy. This method uses extreme cold to freeze the endometrium.  Heated-free liquid. This method uses a heated saltwater (saline) solution to remove the endometrium.  Microwave. This method uses high-energy microwaves to heat up the endometrium and remove it.  Thermal balloon. This method involves inserting a catheter with a balloon tip into   the uterus. The balloon tip is filled with heated fluid to remove the endometrium.  The procedure may vary among health care providers and hospitals.  What happens after the procedure?  Your blood pressure, heart rate, breathing rate, and blood oxygen level will be monitored until the medicines you were given have worn off.  As tissue healing occurs, you may notice vaginal bleeding for 4-6 weeks after the procedure. You may also  experience:  Cramps.  Thin, watery vaginal discharge that is light pink or brown in color.  A need to urinate more frequently than usual.  Nausea.  Do not drive for 24 hours if you were given a sedative.  Do not have sex or insert anything into your vagina until your health care provider approves.  Summary  Endometrial ablation is done to treat the many causes of heavy menstrual bleeding.  The procedure may be done only after medications have been tried to control the bleeding.  Plan to have someone take you home from the hospital or clinic.  This information is not intended to replace advice given to you by your health care provider. Make sure you discuss any questions you have with your health care provider.  Document Revised: 08/31/2017 Document Reviewed: 04/02/2016  Elsevier Patient Education  2020 [employer]

## 2020-02-08 NOTE — Progress Notes (Signed)
Pt presents for Endometrial Biopsy dx: menorrhagia   UPT negative  She is scheduled for EM ablation 11/24. Patient's last menstrual period was 01/08/2020. [id number] Patient given informed consent, signed copy in the chart, time out was performed. Appropriate time out taken. . The patient was placed in the lithotomy position and the cervix brought into view with sterile speculum.  Portio of cervix cleansed x 2 with betadine swabs.  A tenaculum was placed in the anterior lip of the cervix.  The uterus was sounded for depth of 9 cm. A pipelle was introduced to into the uterus, suction created,  and an endometrial sample was obtained. All equipment was removed and accounted for.  The patient tolerated the procedure well.    Patient given post procedure instructions. The patient will have ablation in 2 weeks.  Adam Phenix, MD 02/08/2020

## 2020-02-09 LAB — SURGICAL PATHOLOGY

## 2020-02-12 ENCOUNTER — Other Ambulatory Visit: Payer: Self-pay

## 2020-02-12 ENCOUNTER — Encounter (HOSPITAL_BASED_OUTPATIENT_CLINIC_OR_DEPARTMENT_OTHER): Payer: Self-pay | Admitting: Obstetrics & Gynecology

## 2020-02-15 ENCOUNTER — Other Ambulatory Visit: Payer: Self-pay | Admitting: Obstetrics & Gynecology

## 2020-02-17 ENCOUNTER — Other Ambulatory Visit (HOSPITAL_COMMUNITY)
Admission: RE | Admit: 2020-02-17 | Discharge: 2020-02-17 | Disposition: A | Discharge status: Home / Self Care | Payer: 59 | Source: Ambulatory Visit | Attending: Obstetrics & Gynecology | Admitting: Obstetrics & Gynecology

## 2020-02-17 DIAGNOSIS — Z20822 Contact with and (suspected) exposure to covid-19: Secondary | ICD-10-CM | POA: Insufficient documentation

## 2020-02-17 DIAGNOSIS — Z01812 Encounter for preprocedural laboratory examination: Secondary | ICD-10-CM | POA: Diagnosis present

## 2020-02-17 LAB — SARS CORONAVIRUS 2 (TAT 6-24 HRS): SARS Coronavirus 2: NEGATIVE

## 2020-02-20 ENCOUNTER — Encounter (HOSPITAL_BASED_OUTPATIENT_CLINIC_OR_DEPARTMENT_OTHER)
Admission: RE | Admit: 2020-02-20 | Discharge: 2020-02-20 | Disposition: A | Discharge status: Home / Self Care | Payer: 59 | Source: Ambulatory Visit | Attending: Obstetrics & Gynecology | Admitting: Obstetrics & Gynecology

## 2020-02-20 DIAGNOSIS — Z01812 Encounter for preprocedural laboratory examination: Secondary | ICD-10-CM | POA: Diagnosis not present

## 2020-02-20 LAB — POCT PREGNANCY, URINE: Preg Test, Ur: NEGATIVE

## 2020-02-20 NOTE — Progress Notes (Signed)

## 2020-02-20 NOTE — Progress Notes (Signed)
Left message for pt to come pick up pre surgery drink.

## 2020-02-21 ENCOUNTER — Ambulatory Visit (HOSPITAL_BASED_OUTPATIENT_CLINIC_OR_DEPARTMENT_OTHER): Payer: 59 | Admitting: Anesthesiology

## 2020-02-21 ENCOUNTER — Ambulatory Visit (HOSPITAL_BASED_OUTPATIENT_CLINIC_OR_DEPARTMENT_OTHER)
Admission: RE | Admit: 2020-02-21 | Discharge: 2020-02-21 | Disposition: A | Discharge status: Home / Self Care | Payer: 59 | Attending: Obstetrics & Gynecology | Admitting: Obstetrics & Gynecology

## 2020-02-21 ENCOUNTER — Encounter (HOSPITAL_BASED_OUTPATIENT_CLINIC_OR_DEPARTMENT_OTHER)
Admission: RE | Disposition: A | Discharge status: Home / Self Care | Payer: Self-pay | Source: Home / Self Care | Attending: Obstetrics & Gynecology

## 2020-02-21 ENCOUNTER — Other Ambulatory Visit: Payer: Self-pay

## 2020-02-21 ENCOUNTER — Encounter (HOSPITAL_BASED_OUTPATIENT_CLINIC_OR_DEPARTMENT_OTHER): Payer: Self-pay | Admitting: Obstetrics & Gynecology

## 2020-02-21 DIAGNOSIS — N920 Excessive and frequent menstruation with regular cycle: Secondary | ICD-10-CM | POA: Insufficient documentation

## 2020-02-21 DIAGNOSIS — N939 Abnormal uterine and vaginal bleeding, unspecified: Secondary | ICD-10-CM

## 2020-02-21 HISTORY — PX: DILITATION & CURRETTAGE/HYSTROSCOPY WITH NOVASURE ABLATION: SHX5568

## 2020-02-21 HISTORY — DX: Nausea with vomiting, unspecified: R11.2

## 2020-02-21 HISTORY — DX: Other specified postprocedural states: Z98.890

## 2020-02-21 LAB — POCT PREGNANCY, URINE: Preg Test, Ur: NEGATIVE

## 2020-02-21 SURGERY — DILATATION & CURETTAGE/HYSTEROSCOPY WITH NOVASURE ABLATION
Anesthesia: General | Site: Uterus

## 2020-02-21 MED ORDER — MIDAZOLAM HCL 2 MG/2ML IJ SOLN
INTRAMUSCULAR | Status: AC
  Filled 2020-02-21: qty 2

## 2020-02-21 MED ORDER — PROMETHAZINE HCL 25 MG/ML IJ SOLN: 6.2500 mg | INTRAMUSCULAR | Status: DC | PRN

## 2020-02-21 MED ORDER — IBUPROFEN 600 MG PO TABS: 600.0000 mg | ORAL_TABLET | Freq: Four times a day (QID) | ORAL | 1 refills | Status: DC | PRN

## 2020-02-21 MED ORDER — FENTANYL CITRATE (PF) 100 MCG/2ML IJ SOLN
INTRAMUSCULAR | Status: AC
  Filled 2020-02-21: qty 2

## 2020-02-21 MED ORDER — SCOPOLAMINE 1 MG/3DAYS TD PT72
MEDICATED_PATCH | TRANSDERMAL | Status: AC
  Filled 2020-02-21: qty 1

## 2020-02-21 MED ORDER — POVIDONE-IODINE 10 % EX SWAB: 2.0000 "application " | Freq: Once | CUTANEOUS | Status: DC

## 2020-02-21 MED ORDER — SCOPOLAMINE 1 MG/3DAYS TD PT72
1.0000 | MEDICATED_PATCH | Freq: Once | TRANSDERMAL | Status: DC
  Administered 2020-02-21: 1.5 mg via TRANSDERMAL

## 2020-02-21 MED ORDER — OXYCODONE-ACETAMINOPHEN 5-325 MG PO TABS
1.0000 | ORAL_TABLET | Freq: Four times a day (QID) | ORAL | 0 refills | Status: DC | PRN
Start: 2020-02-21 — End: 2020-04-02

## 2020-02-21 MED ORDER — SODIUM CHLORIDE 0.9 % IR SOLN
Status: DC | PRN
  Administered 2020-02-21: 250 mL

## 2020-02-21 MED ORDER — PROPOFOL 10 MG/ML IV BOLUS
INTRAVENOUS | Status: AC
  Filled 2020-02-21: qty 20

## 2020-02-21 MED ORDER — LIDOCAINE 2% (20 MG/ML) 5 ML SYRINGE
INTRAMUSCULAR | Status: DC | PRN
  Administered 2020-02-21: 80 mg via INTRAVENOUS

## 2020-02-21 MED ORDER — FENTANYL CITRATE (PF) 250 MCG/5ML IJ SOLN
INTRAMUSCULAR | Status: DC | PRN
  Administered 2020-02-21: 25 ug via INTRAVENOUS
  Administered 2020-02-21: 50 ug via INTRAVENOUS
  Administered 2020-02-21: 25 ug via INTRAVENOUS

## 2020-02-21 MED ORDER — ONDANSETRON HCL 4 MG/2ML IJ SOLN
INTRAMUSCULAR | Status: DC | PRN
  Administered 2020-02-21: 4 mg via INTRAVENOUS

## 2020-02-21 MED ORDER — DEXAMETHASONE SODIUM PHOSPHATE 10 MG/ML IJ SOLN
INTRAMUSCULAR | Status: DC | PRN
  Administered 2020-02-21: 10 mg via INTRAVENOUS

## 2020-02-21 MED ORDER — ACETAMINOPHEN 500 MG PO TABS
1000.0000 mg | ORAL_TABLET | Freq: Once | ORAL | Status: AC
  Administered 2020-02-21: 1000 mg via ORAL

## 2020-02-21 MED ORDER — MIDAZOLAM HCL 5 MG/5ML IJ SOLN
INTRAMUSCULAR | Status: DC | PRN
  Administered 2020-02-21: 2 mg via INTRAVENOUS

## 2020-02-21 MED ORDER — LACTATED RINGERS IV SOLN: INTRAVENOUS | Status: DC

## 2020-02-21 MED ORDER — OXYCODONE HCL 5 MG PO TABS
ORAL_TABLET | ORAL | Status: AC
  Filled 2020-02-21: qty 1

## 2020-02-21 MED ORDER — PROPOFOL 10 MG/ML IV BOLUS
INTRAVENOUS | Status: DC | PRN
  Administered 2020-02-21: 170 mg via INTRAVENOUS

## 2020-02-21 MED ORDER — ACETAMINOPHEN 500 MG PO TABS
ORAL_TABLET | ORAL | Status: AC
  Filled 2020-02-21: qty 2

## 2020-02-21 MED ORDER — OXYCODONE HCL 5 MG PO TABS
5.0000 mg | ORAL_TABLET | Freq: Once | ORAL | Status: AC
  Administered 2020-02-21: 5 mg via ORAL

## 2020-02-21 MED ORDER — FENTANYL CITRATE (PF) 100 MCG/2ML IJ SOLN: 25.0000 ug | INTRAMUSCULAR | Status: DC | PRN

## 2020-02-21 MED ORDER — LIDOCAINE 2% (20 MG/ML) 5 ML SYRINGE
INTRAMUSCULAR | Status: AC
  Filled 2020-02-21: qty 5

## 2020-02-21 MED ORDER — BUPIVACAINE HCL 0.25 % IJ SOLN
INTRAMUSCULAR | Status: DC | PRN
  Administered 2020-02-21: 10 mL

## 2020-02-21 SURGICAL SUPPLY — 14 items
ABLATOR SURESOUND NOVASURE (ABLATOR) ×3 IMPLANT
CATH ROBINSON RED A/P 16FR (CATHETERS) ×3 IMPLANT
GAUZE 4X4 16PLY RFD (DISPOSABLE) ×3 IMPLANT
GLOVE BIO SURGEON STRL SZ 6.5 (GLOVE) ×2 IMPLANT
GLOVE BIO SURGEONS STRL SZ 6.5 (GLOVE) ×1
GLOVE BIOGEL PI IND STRL 7.0 (GLOVE) ×2 IMPLANT
GLOVE BIOGEL PI INDICATOR 7.0 (GLOVE) ×4
GOWN STRL REUS W/TWL LRG LVL3 (GOWN DISPOSABLE) ×6 IMPLANT
KIT PROCEDURE FLUENT (KITS) ×3 IMPLANT
PACK VAGINAL MINOR WOMEN LF (CUSTOM PROCEDURE TRAY) ×3 IMPLANT
PAD OB MATERNITY 4.3X12.25 (PERSONAL CARE ITEMS) ×3 IMPLANT
PAD PREP 24X48 CUFFED NSTRL (MISCELLANEOUS) ×6 IMPLANT
SLEEVE SCD COMPRESS KNEE MED (MISCELLANEOUS) ×3 IMPLANT
TOWEL GREEN STERILE FF (TOWEL DISPOSABLE) ×6 IMPLANT

## 2020-02-21 NOTE — Op Note (Signed)
PREOPERATIVE DIAGNOSIS:  Irregular uterine bleeding POSTOPERATIVE DIAGNOSIS: The same PROCEDURE:  NovaSure Endometrial Ablation SURGEON: Adam Phenix, MD  INDICATIONS: 29 y.o. [id number]  here for NovaSure Endometrial Ablation.  Risks of surgery were discussed with the patient including but not limited to: bleeding which may require transfusion; infection which may require antibiotics; injury to uterus leading to risk of injury to surrounding intraperitoneal organs, need for additional procedures including laparoscopy or laparotomy, and other postoperative/anesthesia complications. Written informed consent was obtained.   FINDINGS:  A 6 week size uterus.  ANESTHESIA:   General and paracervical block with 10 ml of 0.25% Marcaine INTRAVENOUS FLUIDS:  500 ml of LR ESTIMATED BLOOD LOSS:  Less than 20 ml SPECIMENS: none COMPLICATIONS:  None immediate  PROCEDURE DETAILS:  The patient was taken to the operating room where general anesthesia was administered and was found to be adequate.  After an adequate timeout was performed, she was placed in the dorsal lithotomy position and examined; then prepped and draped in the sterile manner.. A speculum was then placed in the patient's vagina and a single tooth tenaculum was applied to the anterior lip of the cervix.  A paracervical block using 0.25% Marcaine was administered. The sound was used to obtain the cervical and uterine cavity length measurements at 4 cm and 9 cm respectively; total sounding length 5 cm.   The cervix was  dilated to accommodate the NovaSure device.  The NovaSure device was inserted, and a cavity width of 3.3 cm was determined. Using a power of 86 watts, for 78 sec, the endometrial ablation was performed. The tenaculum was removed from the anterior lip of the cervix, and the vaginal speculum was removed after noting good hemostasis.  The patient tolerated the procedure well and was taken to the recovery area awake, extubated and in stable  condition.  The patient will be discharged to home as per PACU criteria.  Routine postoperative instructions given.  She was prescribed Percocet, Ibuprofen.  She will follow up in the clinic in 4 weeks for postoperative evaluation.  Adam Phenix, MD 02/21/2020 2:05 PM

## 2020-02-21 NOTE — Transfer of Care (Signed)
Immediate Anesthesia Transfer of Care Note  Patient: Sharon Townsend  Procedure(s) Performed: DILATATION & CURETTAGE, WITH NOVASURE ABLATION (N/A Uterus)  Patient Location: PACU  Anesthesia Type:General  Level of Consciousness: awake, alert , oriented and patient cooperative  Airway & Oxygen Therapy: Patient Spontanous Breathing and Patient connected to face mask oxygen  Post-op Assessment: Report given to RN, Post -op Vital signs reviewed and stable and Patient moving all extremities  Post vital signs: Reviewed and stable  Last Vitals:  Vitals Value Taken Time  BP    Temp    Pulse    Resp    SpO2      Last Pain:  Vitals:   02/21/20 1251  TempSrc: Oral  PainSc: 0-No pain         Complications: No complications documented.

## 2020-02-21 NOTE — Anesthesia Procedure Notes (Signed)
Procedure Name: LMA Insertion Date/Time: 02/21/2020 1:33 PM Performed by: Lucinda Dell, CRNA Pre-anesthesia Checklist: Patient identified, Emergency Drugs available, Suction available and Patient being monitored Patient Re-evaluated:Patient Re-evaluated prior to induction Oxygen Delivery Method: Circle system utilized Preoxygenation: Pre-oxygenation with 100% oxygen Induction Type: IV induction Ventilation: Mask ventilation without difficulty LMA: LMA inserted LMA Size: 4.0 Number of attempts: 1 Placement Confirmation: positive ETCO2 and breath sounds checked- equal and bilateral Tube secured with: Tape Dental Injury: Teeth and Oropharynx as per pre-operative assessment

## 2020-02-21 NOTE — H&P (Signed)
HPI Sharon Townsend is a 29 y.o. female.  [id number] Patient's last menstrual period Patient's last menstrual period was 02/16/2020 (exact date). S/p BTL with Filshie clips at the time of cesarean section in 2018. Since then her menses are regular but heavy and may last for 8 days. She has cramps and dyspareunia. Some urinary urge and frequency but no dysuria. She is scheduled today for NovaSure endometrial ablation. Pap and endometrial biopsy were benign. HPI      Past Medical History:  Diagnosis Date  . Bipolar disorder (HCC)   . Chronic constipation   . Gall bladder stones   . Headache   . Renal disorder    two tubes in left kidney going to bladder         Past Surgical History:  Procedure Laterality Date  . CESAREAN SECTION N/A 06/14/2016   Procedure: CESAREAN SECTION;  Surgeon: Philip Aspen, DO;  Location: [facility];  Service: Obstetrics;  Laterality: N/A;  . child birth  2016  . CHOLECYSTECTOMY    . MANDIBLE SURGERY    . renal stents    . TONSILLECTOMY    . TUBAL LIGATION  06/14/2016         Family History  Problem Relation Age of Onset  . Diabetes Mother   . Breast cancer Mother 19  . Diabetes Father   . Hypertension Father   . Breast cancer Maternal Grandmother   . Ovarian cancer Paternal Grandmother     Social History Social History        Tobacco Use  . Smoking status: Never Smoker  . Smokeless tobacco: Never Used  Vaping Use  . Vaping Use: Never used  Substance Use Topics  . Alcohol use: No    Comment: before preg  . Drug use: No         Allergies  Allergen Reactions  . Peanut Oil Anaphylaxis  . Latex Swelling and Other (See Comments)  . Penicillins Other (See Comments)    Reaction:  Pt states that it "shuts down her bowels and cannot urinate" Has patient had a PCN reaction causing immediate rash, facial/tongue/throat swelling, SOB or lightheadedness with hypotension: No Has patient had a PCN  reaction causing severe rash involving mucus membranes or skin necrosis: No Has patient had a PCN reaction that required hospitalization No Has patient had a PCN reaction occurring within the last 10 years: No If all of the above answers are "NO", then may proceed with Cephalosp Reaction:  Pt states that it "shuts down her bowel movements". Reaction:  Pt states that it "shuts down her bowels and cannot urinate"           Current Outpatient Medications  Medication Sig Dispense Refill  . EPINEPHrine 0.3 mg/0.3 mL IJ SOAJ injection Inject 0.3 mLs (0.3 mg total) into the muscle as needed for anaphylaxis. 1 each 1  . Melatonin 10 MG TABS Take by mouth.    . naproxen (NAPROSYN) 500 MG tablet Take 1 tablet (500 mg total) by mouth 2 (two) times daily with a meal. 30 tablet 0  . ondansetron (ZOFRAN ODT) 4 MG disintegrating tablet Take 1 tablet (4 mg total) by mouth every 8 (eight) hours as needed for nausea or vomiting. 30 tablet 0  . cyclobenzaprine (FLEXERIL) 10 MG tablet Take 10 mg by mouth 3 (three) times daily as needed for muscle spasms. (Patient not taking: Reported on 12/18/2019)     No current facility-administered medications for this visit.  Review of Systems Review of Systems  Constitutional: Negative.   Respiratory: Negative.   Genitourinary: Positive for dyspareunia (pelvic), frequency, menstrual problem and pelvic pain. Negative for dysuria, vaginal discharge and vaginal pain.    Blood pressure 120/73, pulse 87, temperature 99.2 F (37.3 C), temperature source Oral, resp. rate 18, height 5' (1.524 m), weight 88 kg, last menstrual period 02/16/2020, SpO2 100 %.    Physical Exam Vitals and nursing note reviewed. Exam conducted with a chaperone present.  Constitutional:      Appearance: Normal appearance. She is not ill-appearing.  HENT:     Head: Normocephalic.  Pulmonary:     Effort: Pulmonary effort is normal.  Abdominal:     Palpations: Abdomen is soft.  There is no mass.     Tenderness: There is no abdominal tenderness.  Neurological:     Mental Status: She is alert.   Breasts: breasts appear normal, no suspicious masses, no skin or nipple changes or axillary nodes.  Data Reviewed Op note 2018  Assessment   Menorrhagia with regular cycle    Plan Patient requests endometrial ablation and is to have NovaSure today.  The risks of surgery were discussed in detail with the patient including but not limited to: bleeding which may require transfusion or reoperation; infection which may require prolonged hospitalization or re-hospitalization and antibiotic therapy; injury to bowel, bladder, ureters and major vessels or other surrounding organs; formation of adhesions; need for additional procedures including laparotomy or subsequent procedures secondary to abnormal pathology; thromboembolic phenomenon; incisional problems and other postoperative or anesthesia complications.  Patient was told that the likelihood that her condition and symptoms will be treated effectively with this surgical management was very high; the postoperative expectations were also discussed in detail. The patient also understands the alternative treatment options which were discussed in full. All questions were answered.   Adam Phenix, MD 02/21/2020

## 2020-02-21 NOTE — Anesthesia Preprocedure Evaluation (Addendum)
Anesthesia Evaluation  Patient identified by MRN, date of birth, ID band Patient awake    Reviewed: Allergy & Precautions, NPO status , Patient's Chart, lab work & pertinent test results  History of Anesthesia Complications (+) PONV and history of anesthetic complications  Airway Mallampati: II  TM Distance: >3 FB Neck ROM: Full    Dental  (+) Teeth Intact, Chipped,    Pulmonary neg pulmonary ROS,    Pulmonary exam normal breath sounds clear to auscultation       Cardiovascular Exercise Tolerance: Good negative cardio ROS Normal cardiovascular exam Rhythm:Regular Rate:Normal     Neuro/Psych  Headaches, PSYCHIATRIC DISORDERS Bipolar Disorder    GI/Hepatic negative GI ROS, Neg liver ROS,   Endo/Other  Obesity   Renal/GU negative Renal ROS     Musculoskeletal negative musculoskeletal ROS (+)   Abdominal   Peds  Hematology negative hematology ROS (+)   Anesthesia Other Findings Day of surgery medications reviewed with the patient.  Reproductive/Obstetrics AUB                            Anesthesia Physical Anesthesia Plan  ASA: II  Anesthesia Plan: General   Post-op Pain Management:    Induction: Intravenous  PONV Risk Score and Plan: 4 or greater and Scopolamine patch - Pre-op, Midazolam, Dexamethasone and Ondansetron  Airway Management Planned: LMA  Additional Equipment:   Intra-op Plan:   Post-operative Plan: Extubation in OR  Informed Consent: I have reviewed the patients History and Physical, chart, labs and discussed the procedure including the risks, benefits and alternatives for the proposed anesthesia with the patient or authorized representative who has indicated his/her understanding and acceptance.       Plan Discussed with: CRNA  Anesthesia Plan Comments:         Anesthesia Quick Evaluation

## 2020-02-21 NOTE — Anesthesia Postprocedure Evaluation (Signed)
Anesthesia Post Note  Patient: Sharon Townsend  Procedure(s) Performed: DILATATION & CURETTAGE, WITH NOVASURE ABLATION (N/A Uterus)     Patient location during evaluation: PACU Anesthesia Type: General Level of consciousness: awake and alert Pain management: pain level controlled Vital Signs Assessment: post-procedure vital signs reviewed and stable Respiratory status: spontaneous breathing, nonlabored ventilation and respiratory function stable Cardiovascular status: blood pressure returned to baseline and stable Postop Assessment: no apparent nausea or vomiting Anesthetic complications: no   No complications documented.  Last Vitals:  Vitals:   02/21/20 1430 02/21/20 1435  BP: 123/71   Pulse: 88 85  Resp: 19 11  Temp:    SpO2: 99% 99%    Last Pain:  Vitals:   02/21/20 1430  TempSrc:   PainSc: 0-No pain                 Cecile Hearing

## 2020-02-21 NOTE — Discharge Instructions (Signed)
Endometrial Ablation, Care After This sheet gives you information about how to care for yourself after your procedure. Your health care provider may also give you more specific instructions. If you have problems or questions, contact your health care provider. What can I expect after the procedure? After the procedure, it is common to have:  A need to urinate more frequently than usual for the first 24 hours.  Cramps similar to menstrual cramps. These may last for 1-2 days.  Thin, watery vaginal discharge that is light pink or brown in color. This may last a few weeks. Discharge will be heavy for the first few days after your procedure. You may need to wear a sanitary pad.  Nausea.  Vaginal bleeding for 4-6 weeks after the procedure, as tissue healing occurs. Follow these instructions at home: Activity  Do not drive for 24 hours if you were given amedicine to help you relax (sedative) during your procedure.  Do not have sex or put anything into your vagina until your health care provider approves.  Do not lift anything that is heavier than 10 lb (4.5 kg), or the limit that you are told, until your health care provider says that it is safe.  Return to your normal activities as told by your health care provider. Ask your health care provider what activities are safe for you. General instructions   Take over-the-counter and prescription medicines only as told by your health care provider.  Do not take baths, swim, or use a hot tub until your health care provider approves. You will be able to take showers.  Check your vaginal area every day for signs of infection. Check for: ? Redness, swelling, or pain. ? More discharge or blood, instead of less. ? Bad-smelling discharge.  Keep all follow-up visits as told by your health care provider. This is important.  Drink enough fluid to keep your urine pale yellow. Contact a health care provider if you have:  Vaginal redness, swelling, or  pain.  Vaginal discharge or bleeding that gets worse instead of getting better.  Bad-smelling vaginal discharge.  A fever or chills.  Trouble urinating. Get help right away if you have:  Heavy vaginal bleeding.  Severe cramps. Summary  After endometrial ablation, it is normal to have thin, watery vaginal discharge that is light pink or brown in color. This may last a few weeks and may be heavier right after the procedure.  Vaginal bleeding is also normal after the procedure and should get better with time.  Check your vaginal area every day for signs of infection, such as bad-smelling discharge.  Keep all follow-up visits as told by your health care provider. This is important. This information is not intended to replace advice given to you by your health care provider. Make sure you discuss any questions you have with your health care provider. Document Revised: 07/07/2018 Document Reviewed: 01/26/2017 Elsevier Patient Education  2020 [employer]  *You had 1000 MG of Tylenol at 1255pm  Post Anesthesia Home Care Instructions  Activity: Get plenty of rest for the remainder of the day. A responsible individual must stay with you for 24 hours following the procedure.  For the next 24 hours, DO NOT: -Drive a car -Advertising copywriter -Drink alcoholic beverages -Take any medication unless instructed by your physician -Make any legal decisions or sign important papers.  Meals: Start with liquid foods such as gelatin or soup. Progress to regular foods as tolerated. Avoid greasy, spicy, heavy foods. If nausea and/or  vomiting occur, drink only clear liquids until the nausea and/or vomiting subsides. Call your physician if vomiting continues.  Special Instructions/Symptoms: Your throat may feel dry or sore from the anesthesia or the breathing tube placed in your throat during surgery. If this causes discomfort, gargle with warm salt water. The discomfort should disappear within 24  hours.  If you had a scopolamine patch placed behind your ear for the management of post- operative nausea and/or vomiting:  1. The medication in the patch is effective for 72 hours, after which it should be removed.  Wrap patch in a tissue and discard in the trash. Wash hands thoroughly with soap and water. 2. You may remove the patch earlier than 72 hours if you experience unpleasant side effects which may include dry mouth, dizziness or visual disturbances. 3. Avoid touching the patch. Wash your hands with soap and water after contact with the patch.

## 2020-02-26 ENCOUNTER — Encounter (HOSPITAL_BASED_OUTPATIENT_CLINIC_OR_DEPARTMENT_OTHER): Payer: Self-pay | Admitting: Obstetrics & Gynecology

## 2020-04-02 ENCOUNTER — Encounter (HOSPITAL_COMMUNITY): Payer: Self-pay

## 2020-04-02 ENCOUNTER — Ambulatory Visit (HOSPITAL_COMMUNITY)
Admission: EM | Admit: 2020-04-02 | Discharge: 2020-04-02 | Disposition: A | Discharge status: Home / Self Care | Payer: 59 | Attending: Family Medicine | Admitting: Family Medicine

## 2020-04-02 ENCOUNTER — Other Ambulatory Visit: Payer: Self-pay

## 2020-04-02 DIAGNOSIS — R0789 Other chest pain: Secondary | ICD-10-CM

## 2020-04-02 DIAGNOSIS — Z20822 Contact with and (suspected) exposure to covid-19: Secondary | ICD-10-CM

## 2020-04-02 MED ORDER — PREDNISONE 20 MG PO TABS: 20.0000 mg | ORAL_TABLET | Freq: Two times a day (BID) | ORAL | 0 refills | Status: DC

## 2020-04-02 NOTE — Discharge Instructions (Addendum)
Hold ibuprofen for now.  Take prednisone 2 times a day for 5 days.  When you are finished with prednisone go back on ibuprofen.  The prednisone will help with chest tightness and shortness of breath  Go home to rest Drink plenty of fluids Take Tylenol for pain or fever You may take over-the-counter cough and cold medicines as needed You must quarantine at home until your test result is available You can check for your test result in MyChart

## 2020-04-02 NOTE — ED Provider Notes (Signed)
[facility]    CSN: [id number] Arrival date & time: 04/02/20  1754      History   Chief Complaint Chief Complaint  Patient presents with  . Fever  . Fatigue  . Generalized Body Aches    HPI Sharon Townsend is a 30 y.o. female.   HPI   Patient had Covid last January.  She is Covid vaccinated since then.  She feels like she has Covid again.  She states she has fever to 101, body aches, chest tightness.  Feels short of breath with exertion.  Shortness of breath is her biggest concern.  He is here for Covid testing.  No known Covid exposure.  She has 2 children at home who are well  Past Medical History:  Diagnosis Date  . Bipolar disorder (HCC)   . Chronic constipation   . Gall bladder stones   . Headache   . PONV (postoperative nausea and vomiting)   . Renal disorder    two tubes in left kidney going to bladder    Patient Active Problem List   Diagnosis Date Noted  . Menorrhagia with regular cycle 12/18/2019  . Dyspareunia in female 12/18/2019  . Family history of breast cancer 12/18/2019  . Family history of ovarian cancer 12/18/2019    Past Surgical History:  Procedure Laterality Date  . CESAREAN SECTION N/A 06/14/2016   Procedure: CESAREAN SECTION;  Surgeon: Philip Aspen, DO;  Location: [facility] SU[facility];  Service: Obstetrics;  Laterality: N/A;  . child birth  2016  . CHOLECYSTECTOMY    . DILITATION & CURRETTAGE/HYSTROSCOPY WITH NOVASURE ABLATION N/A 02/21/2020   Procedure: DILATATION & CURETTAGE, WITH NOVASURE ABLATION;  Surgeon: Adam Phenix, MD;  Location: [facility];  Service: Gynecology;  Laterality: N/A;  . MANDIBLE SURGERY    . renal stents    . TONSILLECTOMY    . TUBAL LIGATION  06/14/2016    OB History    Gravida  4   Para  3   Term  1   Preterm  2   AB  1   Living  2     SAB  1   IAB  0   Ectopic  0   Multiple  0   Live Births  2            Home Medications    Prior to Admission  medications   Medication Sig Start Date End Date Taking? Authorizing Provider  predniSONE (DELTASONE) 20 MG tablet Take 1 tablet (20 mg total) by mouth 2 (two) times daily with a meal. 04/02/20  Yes Eustace Moore, MD  EPINEPHrine 0.3 mg/0.3 mL IJ SOAJ injection Inject 0.3 mLs (0.3 mg total) into the muscle as needed for anaphylaxis. Patient not taking: Reported on 01/29/2020 07/14/19   Overton Mam, DO  ibuprofen (ADVIL) 200 MG tablet Take 200 mg by mouth every 6 (six) hours as needed.    [provider]  ibuprofen (ADVIL) 600 MG tablet Take 1 tablet (600 mg total) by mouth every 6 (six) hours as needed. 02/21/20   Adam Phenix, MD  Melatonin 10 MG TABS Take by mouth.     [provider]    Family History Family History  Problem Relation Age of Onset  . Diabetes Mother   . Breast cancer Mother 56  . Diabetes Father   . Hypertension Father   . Breast cancer Maternal Grandmother   . Ovarian cancer Paternal Grandmother  Social History Social History   Tobacco Use  . Smoking status: Never Smoker  . Smokeless tobacco: Never Used  Vaping Use  . Vaping Use: Never used  Substance Use Topics  . Alcohol use: No    Comment: before preg  . Drug use: No     Allergies   Peanut oil, Latex, and Penicillins   Review of Systems Review of Systems  See HPI Physical Exam Triage Vital Signs ED Triage Vitals [04/02/20 1952]  Enc Vitals Group     BP 128/80     Pulse Rate 82     Resp 18     Temp 98.3 F (36.8 C)     Temp Source Oral     SpO2 100 %     Weight      Height      Head Circumference      Peak Flow      Pain Score 0     Pain Loc      Pain Edu?      Excl. in [place]?    No data found.  Updated Vital Signs BP 128/80 (BP Location: Right Arm)   Pulse 82   Temp 98.3 F (36.8 C) (Oral)   Resp 18   SpO2 100%      Physical Exam Constitutional:      General: She is not in acute distress.    Appearance: She is well-developed and  well-nourished.     Comments: Mild overweight  HENT:     Head: Normocephalic and atraumatic.     Mouth/Throat:     Mouth: Oropharynx is clear and moist. Mucous membranes are moist.     Pharynx: No posterior oropharyngeal erythema.  Eyes:     Conjunctiva/sclera: Conjunctivae normal.     Pupils: Pupils are equal, round, and reactive to light.  Cardiovascular:     Rate and Rhythm: Normal rate and regular rhythm.     Heart sounds: Normal heart sounds.  Pulmonary:     Effort: Pulmonary effort is normal. No respiratory distress.     Breath sounds: Normal breath sounds. No wheezing or rales.  Abdominal:     General: There is no distension.     Palpations: Abdomen is soft.  Musculoskeletal:        General: No edema. Normal range of motion.     Cervical back: Normal range of motion.  Skin:    General: Skin is warm and dry.  Neurological:     Mental Status: She is alert.      UC Treatments / Results  Labs (all labs ordered are listed, but only abnormal results are displayed) Labs Reviewed - No data to display  EKG   Radiology No results found.  Procedures Procedures (including critical care time)  Medications Ordered in UC Medications - No data to display  Initial Impression / Assessment and Plan / UC Course  I have reviewed the triage vital signs and the nursing notes.  Pertinent labs & imaging results that were available during my care of the patient were reviewed by me and considered in my medical decision making (see chart for details).     Patient states her chest discomfort is her biggest problem.  She is taking ibuprofen 600 mg 3 times a day with food.  This is not helping with her chest tightness and discomfort.  Prescribing prednisone 20 twice a day for 5 days.  Hold the ibuprofen while on prednisone.  Call for any questions or problems  Final Clinical Impressions(s) / UC Diagnoses   Final diagnoses:  Chest tightness  Encounter for laboratory testing for  COVID-19 virus     Discharge Instructions     Hold ibuprofen for now.  Take prednisone 2 times a day for 5 days.  When you are finished with prednisone go back on ibuprofen.  The prednisone will help with chest tightness and shortness of breath  Go home to rest Drink plenty of fluids Take Tylenol for pain or fever You may take over-the-counter cough and cold medicines as needed You must quarantine at home until your test result is available You can check for your test result in MyChart    ED Prescriptions    Medication Sig Dispense Auth. Provider   predniSONE (DELTASONE) 20 MG tablet Take 1 tablet (20 mg total) by mouth 2 (two) times daily with a meal. 10 tablet Eustace Moore, MD     PDMP not reviewed this encounter.   Eustace Moore, MD 04/02/20 2033

## 2020-04-02 NOTE — ED Triage Notes (Signed)
Pt presents for COVID testing after exposure on 03/29/2020. Reports having fever 101.0 F, body aches and chest tightness. States the chest tightness feels the same when she had COVID January 2021.

## 2020-05-02 ENCOUNTER — Telehealth: Payer: Medicaid Other | Admitting: Emergency Medicine

## 2020-05-02 DIAGNOSIS — R300 Dysuria: Secondary | ICD-10-CM

## 2020-05-02 MED ORDER — NITROFURANTOIN MONOHYD MACRO 100 MG PO CAPS: 100.0000 mg | ORAL_CAPSULE | Freq: Two times a day (BID) | ORAL | 0 refills | Status: DC

## 2020-05-02 NOTE — Progress Notes (Signed)
We are sorry that you are not feeling well.  Here is how we plan to help!  Based on what you shared with me it looks like you most likely have a simple urinary tract infection.  A UTI (Urinary Tract Infection) is a bacterial infection of the bladder.  Most cases of urinary tract infections are simple to treat but a key part of your care is to encourage you to drink plenty of fluids and watch your symptoms carefully.  I have prescribed MacroBid 100 mg twice a day for 5 days.  Your symptoms should gradually improve. Call us if the burning in your urine worsens, you develop worsening fever, back pain or pelvic pain or if your symptoms do not resolve after completing the antibiotic.  Urinary tract infections can be prevented by drinking plenty of water to keep your body hydrated.  Also be sure when you wipe, wipe from front to back and don't hold it in!  If possible, empty your bladder every 4 hours.  Your e-visit answers were reviewed by a board certified advanced clinical practitioner to complete your personal care plan.  Depending on the condition, your plan could have included both over the counter or prescription medications.  If there is a problem please reply  once you have received a response from your provider.  Your safety is important to us.  If you have drug allergies check your prescription carefully.    You can use MyChart to ask questions about today's visit, request a non-urgent call back, or ask for a work or school excuse for 24 hours related to this e-Visit. If it has been greater than 24 hours you will need to follow up with your provider, or enter a new e-Visit to address those concerns.   You will get an e-mail in the next two days asking about your experience.  I hope that your e-visit has been valuable and will speed your recovery. Thank you for using e-visits.   Approximately 5 minutes was used in reviewing the patient's chart, questionnaire, prescribing medications, and  documentation.  

## 2020-05-18 ENCOUNTER — Telehealth: Payer: Medicaid Other | Admitting: Orthopedic Surgery

## 2020-05-18 DIAGNOSIS — H9202 Otalgia, left ear: Secondary | ICD-10-CM | POA: Diagnosis not present

## 2020-05-18 MED ORDER — CIPROFLOXACIN-DEXAMETHASONE 0.3-0.1 % OT SUSP: 4.0000 [drp] | Freq: Two times a day (BID) | OTIC | 0 refills | Status: DC

## 2020-05-18 NOTE — Progress Notes (Signed)
E Visit for Swimmer's Ear  We are sorry that you are not feeling well. Here is how we plan to help!  I have prescribed: Ciprofloxin 0.3% and hydrocortisone 1% otic suspension 3 drops in affected ears twice daily for 7 days   In certain cases swimmer's ear may progress to a more serious bacterial infection of the middle or inner ear.  If you have a fever 102 and up and significantly worsening symptoms, this could indicate a more serious infection moving to the middle/inner and needs face to face evaluation in an office by a provider.  Your symptoms should improve over the next 3 days and should resolve in about 7 days.  HOME CARE:   Wash your hands frequently.  Do not place the tip of the bottle on your ear or touch it with your fingers.  You can take Acetominophen 650 mg every 4-6 hours as needed for pain.  If pain is severe or moderate, you can apply a heating pad (set on low) or hot water bottle (wrapped in a towel) to outer ear for 20 minutes.  This will also increase drainage.  Avoid ear plugs  Do not use Q-tips  After showers, help the water run out by tilting your head to one side.  GET HELP RIGHT AWAY IF:   Fever is over 102.2 degrees.  You develop progressive ear pain or hearing loss.  Ear symptoms persist longer than 3 days after treatment.  MAKE SURE YOU:   Understand these instructions.  Will watch your condition.  Will get help right away if you are not doing well or get worse.  TO PREVENT SWIMMER'S EAR:  Use a bathing cap or custom fitted swim molds to keep your ears dry.  Towel off after swimming to dry your ears.  Tilt your head or pull your earlobes to allow the water to escape your ear canal.  If there is still water in your ears, consider using a hairdryer on the lowest setting.  Thank you for choosing an e-visit. Your e-visit answers were reviewed by a board certified advanced clinical practitioner to complete your personal care plan.  Depending upon the condition, your plan could have included both over the counter or prescription medications. Please review your pharmacy choice. Be sure that the pharmacy you have chosen is open so that you can pick up your prescription now.  If there is a problem you may message your provider in MyChart to have the prescription routed to another pharmacy. Your safety is important to [place]. If you have drug allergies check your prescription carefully.  For the next 24 hours, you can use MyChart to ask questions about today's visit, request a non-urgent call back, or ask for a work or school excuse from your e-visit provider. You will get an email in the next two days asking about your experience. I hope that your e-visit has been valuable and will speed your recovery.      Greater than 5 minutes, yet less than 10 minutes of time have been spent researching, coordinating and implementing care for this patient today.

## 2020-05-25 ENCOUNTER — Telehealth: Payer: Medicaid Other | Admitting: Nurse Practitioner

## 2020-05-25 DIAGNOSIS — R1115 Cyclical vomiting syndrome unrelated to migraine: Secondary | ICD-10-CM

## 2020-05-25 MED ORDER — ONDANSETRON HCL 4 MG PO TABS: 4.0000 mg | ORAL_TABLET | Freq: Three times a day (TID) | ORAL | 0 refills | Status: DC | PRN

## 2020-05-25 NOTE — Progress Notes (Signed)

## 2020-06-11 ENCOUNTER — Ambulatory Visit: Payer: Medicaid Other | Admitting: Medical-Surgical

## 2020-06-12 ENCOUNTER — Ambulatory Visit (INDEPENDENT_AMBULATORY_CARE_PROVIDER_SITE_OTHER): Payer: Medicaid Other

## 2020-06-12 ENCOUNTER — Other Ambulatory Visit: Payer: Self-pay

## 2020-06-12 ENCOUNTER — Ambulatory Visit
Admission: EM | Admit: 2020-06-12 | Discharge: 2020-06-12 | Disposition: A | Discharge status: Home / Self Care | Payer: Medicaid Other | Attending: Family Medicine | Admitting: Family Medicine

## 2020-06-12 DIAGNOSIS — W19XXXA Unspecified fall, initial encounter: Secondary | ICD-10-CM

## 2020-06-12 DIAGNOSIS — M25511 Pain in right shoulder: Secondary | ICD-10-CM

## 2020-06-12 DIAGNOSIS — S4991XA Unspecified injury of right shoulder and upper arm, initial encounter: Secondary | ICD-10-CM | POA: Diagnosis not present

## 2020-06-12 MED ORDER — IBUPROFEN 800 MG PO TABS: 800.0000 mg | ORAL_TABLET | Freq: Three times a day (TID) | ORAL | 0 refills | Status: DC

## 2020-06-12 NOTE — ED Triage Notes (Signed)
Pt fel down stairs and falling on right shoulder, pain radiated to hand

## 2020-06-12 NOTE — ED Provider Notes (Signed)
[name]    CSN: [id number] Arrival date & time: 06/12/20  1339      History   Chief Complaint Chief Complaint  Patient presents with  . Fall    HPI Sharon Townsend is a 30 y.o. female.   Patient presenting today with right shoulder pain and decreased range of motion after falling down the stairs yesterday and landing on the shoulder.  She states the pain is shooting down her arm into her hand at this time.  Some weakness into this arm, numbness to fourth and fifth fingers.  Denies redness, bruising, abrasions, diffuse swelling, other injuries from fall.  Has not tried anything over-the-counter for symptoms at this time.  No past history of orthopedic issues.     Past Medical History:  Diagnosis Date  . Bipolar disorder (HCC)   . Chronic constipation   . Gall bladder stones   . Headache   . PONV (postoperative nausea and vomiting)   . Renal disorder    two tubes in left kidney going to bladder    Patient Active Problem List   Diagnosis Date Noted  . Menorrhagia with regular cycle 12/18/2019  . Dyspareunia in female 12/18/2019  . Family history of breast cancer 12/18/2019  . Family history of ovarian cancer 12/18/2019    Past Surgical History:  Procedure Laterality Date  . CESAREAN SECTION N/A 06/14/2016   Procedure: CESAREAN SECTION;  Surgeon: Philip Aspen, DO;  Location: [facility];  Service: Obstetrics;  Laterality: N/A;  . child birth  2016  . CHOLECYSTECTOMY    . DILITATION & CURRETTAGE/HYSTROSCOPY WITH NOVASURE ABLATION N/A 02/21/2020   Procedure: DILATATION & CURETTAGE, WITH NOVASURE ABLATION;  Surgeon: Adam Phenix, MD;  Location: [facility];  Service: Gynecology;  Laterality: N/A;  . MANDIBLE SURGERY    . renal stents    . TONSILLECTOMY    . TUBAL LIGATION  06/14/2016    OB History    Gravida  4   Para  3   Term  1   Preterm  2   AB  1   Living  2     SAB  1   IAB  0   Ectopic  0    Multiple  0   Live Births  2            Home Medications    Prior to Admission medications   Medication Sig Start Date End Date Taking? Authorizing Provider  ibuprofen (ADVIL) 800 MG tablet Take 1 tablet (800 mg total) by mouth 3 (three) times daily. 06/12/20  Yes Particia Nearing, PA-C  ciprofloxacin-dexamethasone [facility]) OTIC suspension Place 4 drops into the left ear 2 (two) times daily. 05/18/20   Freeman Caldron, PA-C  EPINEPHrine 0.3 mg/0.3 mL IJ SOAJ injection Inject 0.3 mLs (0.3 mg total) into the muscle as needed for anaphylaxis. Patient not taking: Reported on 01/29/2020 07/14/19   Overton Mam, DO  ibuprofen (ADVIL) 200 MG tablet Take 200 mg by mouth every 6 (six) hours as needed.    [provider]  ibuprofen (ADVIL) 600 MG tablet Take 1 tablet (600 mg total) by mouth every 6 (six) hours as needed. 02/21/20   Adam Phenix, MD  Melatonin 10 MG TABS Take by mouth.     [provider]  nitrofurantoin, macrocrystal-monohydrate, (MACROBID) 100 MG capsule Take 1 capsule (100 mg total) by mouth 2 (two) times daily. 05/02/20   Roxy Horseman, PA-C  ondansetron (ZOFRAN) 4 MG tablet Take 1 tablet (4 mg total) by mouth every 8 (eight) hours as needed for nausea or vomiting. 05/25/20   Daphine Deutscher, Mary-Margaret, FNP  predniSONE (DELTASONE) 20 MG tablet Take 1 tablet (20 mg total) by mouth 2 (two) times daily with a meal. 04/02/20   Eustace Moore, MD    Family History Family History  Problem Relation Age of Onset  . Diabetes Mother   . Breast cancer Mother 90  . Diabetes Father   . Hypertension Father   . Breast cancer Maternal Grandmother   . Ovarian cancer Paternal Grandmother     Social History Social History   Tobacco Use  . Smoking status: Never Smoker  . Smokeless tobacco: Never Used  Vaping Use  . Vaping Use: Never used  Substance Use Topics  . Alcohol use: No    Comment: before preg  . Drug use: No     Allergies   Peanut  oil, Latex, and Penicillins   Review of Systems Review of Systems Per HPI Physical Exam Triage Vital Signs ED Triage Vitals [06/12/20 1454]  Enc Vitals Group     BP 123/84     Pulse Rate 76     Resp 20     Temp 98.5 F (36.9 C)     Temp Source Oral     SpO2 98 %     Weight      Height      Head Circumference      Peak Flow      Pain Score      Pain Loc      Pain Edu?      Excl. in GC?    No data found.  Updated Vital Signs BP 123/84 (BP Location: Right Arm)   Pulse 76   Temp 98.5 F (36.9 C) (Oral)   Resp 20   SpO2 98%   Visual Acuity Right Eye Distance:   Left Eye Distance:   Bilateral Distance:    Right Eye Near:   Left Eye Near:    Bilateral Near:     Physical Exam Vitals and nursing note reviewed.  Constitutional:      Appearance: Normal appearance. She is not ill-appearing.  HENT:     Head: Atraumatic.     Mouth/Throat:     Mouth: Mucous membranes are moist.     Pharynx: Oropharynx is clear.  Eyes:     Extraocular Movements: Extraocular movements intact.     Conjunctiva/sclera: Conjunctivae normal.  Cardiovascular:     Rate and Rhythm: Normal rate and regular rhythm.     Heart sounds: Normal heart sounds.  Pulmonary:     Effort: Pulmonary effort is normal.     Breath sounds: Normal breath sounds. No wheezing or rales.  Abdominal:     General: Bowel sounds are normal. There is no distension.     Palpations: Abdomen is soft.     Tenderness: There is no abdominal tenderness. There is no guarding.  Musculoskeletal:        General: Tenderness present.     Cervical back: Normal range of motion and neck supple.     Comments: Tender to palpation right shoulder, particularly toward trapezius and latissimus muscles. Mildly decreased grip strength right hand versus left  Skin:    General: Skin is warm and dry.     Findings: No bruising, erythema or lesion.  Neurological:     Mental Status: She is alert and oriented to person,  place, and time.      Comments: Bilateral upper extremities neurovascularly intact  Psychiatric:        Mood and Affect: Mood normal.        Thought Content: Thought content normal.        Judgment: Judgment normal.      UC Treatments / Results  Labs (all labs ordered are listed, but only abnormal results are displayed) Labs Reviewed - No data to display  EKG   Radiology DG Shoulder Right  Result Date: 06/12/2020 CLINICAL DATA:  Fall down stairs.  Injury EXAM: RIGHT SHOULDER - 2+ VIEW COMPARISON:  None. FINDINGS: There is no evidence of fracture or dislocation. There is no evidence of arthropathy or other focal bone abnormality. Soft tissues are unremarkable. IMPRESSION: Negative. Electronically Signed   By: Marlan Palau M.D.   On: 06/12/2020 15:28    Procedures Procedures (including critical care time)  Medications Ordered in UC Medications - No data to display  Initial Impression / Assessment and Plan / UC Course  I have reviewed the triage vital signs and the nursing notes.  Pertinent labs & imaging results that were available during my care of the patient were reviewed by me and considered in my medical decision making (see chart for details).     X-ray right shoulder negative for bony abnormality.  Suspect contusion from impact fall.  Discussed ibuprofen as needed, heat, ice, rest.  Follow-up with PCP for recheck.  Final Clinical Impressions(s) / UC Diagnoses   Final diagnoses:  Acute pain of right shoulder  Fall, initial encounter   Discharge Instructions   None    ED Prescriptions    Medication Sig Dispense Auth. Provider   ibuprofen (ADVIL) 800 MG tablet Take 1 tablet (800 mg total) by mouth 3 (three) times daily. 30 tablet Particia Nearing, [place]     PDMP not reviewed this encounter.   Particia Nearing, [place] 06/12/20 1857

## 2020-06-20 ENCOUNTER — Ambulatory Visit (HOSPITAL_BASED_OUTPATIENT_CLINIC_OR_DEPARTMENT_OTHER): Payer: Medicaid Other | Admitting: Family Medicine

## 2020-06-20 ENCOUNTER — Encounter (HOSPITAL_BASED_OUTPATIENT_CLINIC_OR_DEPARTMENT_OTHER): Payer: Self-pay

## 2020-07-01 ENCOUNTER — Encounter (HOSPITAL_BASED_OUTPATIENT_CLINIC_OR_DEPARTMENT_OTHER): Payer: Self-pay | Admitting: Family Medicine

## 2020-07-01 ENCOUNTER — Ambulatory Visit: Payer: Medicaid Other | Admitting: Internal Medicine

## 2020-07-04 ENCOUNTER — Other Ambulatory Visit: Payer: Self-pay

## 2020-07-04 ENCOUNTER — Ambulatory Visit (HOSPITAL_BASED_OUTPATIENT_CLINIC_OR_DEPARTMENT_OTHER): Payer: Medicaid Other | Admitting: Family Medicine

## 2020-07-04 ENCOUNTER — Ambulatory Visit: Payer: Medicaid Other | Admitting: Family Medicine

## 2020-07-04 ENCOUNTER — Encounter: Payer: Self-pay | Admitting: Family Medicine

## 2020-07-04 VITALS — BP 105/80 | HR 80 | Temp 98.4°F | Ht 60.0 in | Wt 199.4 lb

## 2020-07-04 DIAGNOSIS — G43719 Chronic migraine without aura, intractable, without status migrainosus: Secondary | ICD-10-CM

## 2020-07-04 MED ORDER — SUMATRIPTAN SUCCINATE 50 MG PO TABS: ORAL_TABLET | ORAL | 1 refills | Status: DC

## 2020-07-04 MED ORDER — TOPIRAMATE 25 MG PO TABS: ORAL_TABLET | ORAL | 1 refills | Status: DC

## 2020-07-04 NOTE — Patient Instructions (Signed)
   [facility] [address], [place], [place] [address] [phone]  [facility] [address], [place], [place] [address] [phone]    

## 2020-07-04 NOTE — Progress Notes (Signed)
Sharon Townsend is a 30 y.o. female  Chief Complaint  Patient presents with  . Headache    Pt c/o hx of headaches since 2018.  Pt has everyday sometimes caffeine will help along with ibuprofen.  Pt c/o seeing sparkles, and feeling dizzy when getting up fast x42yr    HPI: Sharon DOSANJHis a 30y.o. female patient seen today to discuss headaches. She notes long h/o headaches but sates she has had them daily since 2018. She takes ibuprofen 809mand caffeine with relief at times. + photophobia. + lightheadedness. She also notes seeing "sparkles" in her vision on/off x 1 year when she has a headache.  She wakes up with a headache more mornings than she does not. She does not snore since having tonsils and adenoids removed in 2001. Headaches also occur after dinner.  Last eye exam - 5+ years Sleeps 7hrs per night on average.  Water - 3-4 16oz bottles per day She took fioricet during pregnancy but this was not effective.     Past Medical History:  Diagnosis Date  . Bipolar disorder ([place]  . Chronic constipation   . Gall bladder stones   . Headache   . PONV (postoperative nausea and vomiting)   . Renal disorder    two tubes in left kidney going to bladder    Past Surgical History:  Procedure Laterality Date  . CESAREAN SECTION N/A 06/14/2016   Procedure: CESAREAN SECTION;  Surgeon: SiAllyn KennerDO;  [facility] Service: Ob[facility];  Laterality: N/A;  . child birth  2016  . CHOLECYSTECTOMY    . DILITATION & CURRETTAGE/HYSTROSCOPY WITH NOVASURE ABLATION N/A 02/21/2020   Procedure: DILATATION & CURETTAGE, WITH NOVASURE ABLATION;  Surgeon: ArWoodroe ModeMD;  [facility] Service: Gynecology;  Laterality: N/A;  . MANDIBLE SURGERY    . renal stents    . TONSILLECTOMY    . TUBAL LIGATION  06/14/2016    Social History   Socioeconomic History  . Marital status: Married    Spouse name: Not on file  . Number of children: Not on  file  . Years of education: Not on file  . Highest education level: Not on file  Occupational History  . Not on file  Tobacco Use  . Smoking status: Never Smoker  . Smokeless tobacco: Never Used  Vaping Use  . Vaping Use: Never used  Substance and Sexual Activity  . Alcohol use: No    Comment: before preg  . Drug use: No  . Sexual activity: Yes    Partners: Male    Birth control/protection: None  Other Topics Concern  . Not on file  Social History Narrative  . Not on file   Social Determinants of Health   Financial Resource Strain: Not on file  Food Insecurity: Not on file  Transportation Needs: Not on file  Physical Activity: Not on file  Stress: Not on file  Social Connections: Not on file  Intimate Partner Violence: Not on file    Family History  Problem Relation Age of Onset  . Diabetes Mother   . Breast cancer Mother 3450. Diabetes Father   . Hypertension Father   . Breast cancer Maternal Grandmother   . Ovarian cancer Paternal Grandmother      Immunization History  Administered Date(s) Administered  . MMR 08/05/2014  . PFIZER(Purple Top)SARS-COV-2 Vaccination 06/28/2019, 07/20/2019, 11/23/2019  . Tdap 08/04/2014    Outpatient Encounter  Medications as of 07/04/2020  Medication Sig  . EPINEPHrine 0.3 mg/0.3 mL IJ SOAJ injection Inject 0.3 mLs (0.3 mg total) into the muscle as needed for anaphylaxis.  Marland Kitchen ibuprofen (ADVIL) 200 MG tablet Take 200 mg by mouth every 6 (six) hours as needed.  Marland Kitchen ibuprofen (ADVIL) 600 MG tablet Take 1 tablet (600 mg total) by mouth every 6 (six) hours as needed.  Marland Kitchen ibuprofen (ADVIL) 800 MG tablet Take 1 tablet (800 mg total) by mouth 3 (three) times daily.  . Melatonin 10 MG TABS Take by mouth.   . ondansetron (ZOFRAN) 4 MG tablet Take 1 tablet (4 mg total) by mouth every 8 (eight) hours as needed for nausea or vomiting.  . ciprofloxacin-dexamethasone (CIPRODEX) OTIC suspension Place 4 drops into the left ear 2 (two) times daily.   . nitrofurantoin, macrocrystal-monohydrate, (MACROBID) 100 MG capsule Take 1 capsule (100 mg total) by mouth 2 (two) times daily. (Patient not taking: Reported on 07/04/2020)  . predniSONE (DELTASONE) 20 MG tablet Take 1 tablet (20 mg total) by mouth 2 (two) times daily with a meal. (Patient not taking: Reported on 07/04/2020)   No facility-administered encounter medications on file as of 07/04/2020.     ROS: Pertinent positives and negatives noted in HPI. Remainder of ROS non-contributory    Allergies  Allergen Reactions  . Peanut Oil Anaphylaxis  . Latex Swelling and Other (See Comments)  . Penicillins Other (See Comments)    Reaction:  Pt states that it "shuts down her bowels and cannot urinate" Has patient had a PCN reaction causing immediate rash, facial/tongue/throat swelling, SOB or lightheadedness with hypotension: No Has patient had a PCN reaction causing severe rash involving mucus membranes or skin necrosis: No Has patient had a PCN reaction that required hospitalization No Has patient had a PCN reaction occurring within the last 10 years: No If all of the above answers are "NO", then may proceed with Cephalosp Reaction:  Pt states that it "shuts down her bowel movements". Reaction:  Pt states that it "shuts down her bowels and cannot urinate"     BP 105/80 (BP Location: Left Arm, Patient Position: Sitting, Cuff Size: Normal)   Pulse 80   Temp 98.4 F (36.9 C) (Temporal)   Ht 5' (1.524 m)   Wt 199 lb 6.4 oz (90.4 kg)   SpO2 99%   BMI 38.94 kg/m   Wt Readings from Last 3 Encounters:  07/04/20 199 lb 6.4 oz (90.4 kg)  02/21/20 194 lb 0.1 oz (88 kg)  02/08/20 193 lb (87.5 kg)   Temp Readings from Last 3 Encounters:  07/04/20 98.4 F (36.9 C) (Temporal)  06/12/20 98.5 F (36.9 C) (Oral)  04/02/20 98.3 F (36.8 C) (Oral)   BP Readings from Last 3 Encounters:  07/04/20 105/80  06/12/20 123/84  04/02/20 128/80   Pulse Readings from Last 3 Encounters:  07/04/20  80  06/12/20 76  04/02/20 82    Physical Exam Constitutional:      General: She is not in acute distress.    Appearance: She is obese.  HENT:     Head: Normocephalic and atraumatic.  Neurological:     Mental Status: She is alert and oriented to person, place, and time.     Coordination: Coordination normal.     Gait: Gait normal.  Psychiatric:        Mood and Affect: Mood normal.        Behavior: Behavior normal.      A/P:  1. Intractable chronic migraine without aura and without status migrainosus - daily since 2018 - recommend eye exam and recommendations given Rx: - topiramate (TOPAMAX) 25 MG tablet; 1 tab po daily x 1 week then 1 tab po BID  Dispense: 180 tablet; Refill: 1 - SUMAtriptan (IMITREX) 50 MG tablet; 1-2 tabs po PRN, May repeat x 1 dose in 2 hours if headache persists or recurs.  Dispense: 20 tablet; Refill: 1 - f/u via MyChart or phone or OV in 4-6wks or sooner PRN   This visit occurred during the SARS-CoV-2 public health emergency.  Safety protocols were in place, including screening questions prior to the visit, additional usage of staff PPE, and extensive cleaning of exam room while observing appropriate contact time as indicated for disinfecting solutions.

## 2020-07-05 ENCOUNTER — Ambulatory Visit (INDEPENDENT_AMBULATORY_CARE_PROVIDER_SITE_OTHER): Payer: Medicaid Other

## 2020-07-05 ENCOUNTER — Ambulatory Visit: Payer: Medicaid Other | Admitting: Podiatry

## 2020-07-05 ENCOUNTER — Encounter: Payer: Self-pay | Admitting: Podiatry

## 2020-07-05 DIAGNOSIS — M7751 Other enthesopathy of right foot: Secondary | ICD-10-CM

## 2020-07-05 DIAGNOSIS — M205X1 Other deformities of toe(s) (acquired), right foot: Secondary | ICD-10-CM

## 2020-07-05 DIAGNOSIS — S92301A Fracture of unspecified metatarsal bone(s), right foot, initial encounter for closed fracture: Secondary | ICD-10-CM | POA: Diagnosis not present

## 2020-07-05 NOTE — Progress Notes (Signed)
Subjective:  Patient ID: Sharon Townsend, female    DOB: Dec 24, 1990,  MRN: [id number]  Chief Complaint  Patient presents with  . Toe Pain    Right toe pain    30 y.o. female presents with the above complaint.  Patient presents with complaint of right first metatarsophalangeal joint pain.  Patient states it hurts with ambulation.  Longer she is on her foot the more it hurts.  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  She would like to discuss treatment options for this.  She has not seen anyone else prior to seeing me.  She states that she did fell down her stairs and could have injured it a little bit more and her pain has gotten worse since then.  She did used to have pain prior to the injury as well.  She denies any other acute complaints.   Review of Systems: Negative except as noted in the HPI. Denies N/V/F/Ch.  Past Medical History:  Diagnosis Date  . Bipolar disorder (HCC)   . Chronic constipation   . Gall bladder stones   . Headache   . PONV (postoperative nausea and vomiting)   . Renal disorder    two tubes in left kidney going to bladder    Current Outpatient Medications:  .  ciprofloxacin-dexamethasone (CIPRODEX) OTIC suspension, Place 4 drops into the left ear 2 (two) times daily., Disp: 7.5 mL, Rfl: 0 .  EPINEPHrine 0.3 mg/0.3 mL IJ SOAJ injection, Inject 0.3 mLs (0.3 mg total) into the muscle as needed for anaphylaxis., Disp: 1 each, Rfl: 1 .  ibuprofen (ADVIL) 200 MG tablet, Take 200 mg by mouth every 6 (six) hours as needed., Disp: , Rfl:  .  ibuprofen (ADVIL) 600 MG tablet, Take 1 tablet (600 mg total) by mouth every 6 (six) hours as needed., Disp: 30 tablet, Rfl: 1 .  ibuprofen (ADVIL) 800 MG tablet, Take 1 tablet (800 mg total) by mouth 3 (three) times daily., Disp: 30 tablet, Rfl: 0 .  Melatonin 10 MG TABS, Take by mouth. , Disp: , Rfl:  .  nitrofurantoin, macrocrystal-monohydrate, (MACROBID) 100 MG capsule, Take 1 capsule (100 mg total) by mouth 2  (two) times daily. (Patient not taking: Reported on 07/04/2020), Disp: 10 capsule, Rfl: 0 .  ondansetron (ZOFRAN) 4 MG tablet, Take 1 tablet (4 mg total) by mouth every 8 (eight) hours as needed for nausea or vomiting., Disp: 20 tablet, Rfl: 0 .  predniSONE (DELTASONE) 20 MG tablet, Take 1 tablet (20 mg total) by mouth 2 (two) times daily with a meal. (Patient not taking: Reported on 07/04/2020), Disp: 10 tablet, Rfl: 0 .  SUMAtriptan (IMITREX) 50 MG tablet, 1-2 tabs po PRN, May repeat x 1 dose in 2 hours if headache persists or recurs., Disp: 20 tablet, Rfl: 1 .  topiramate (TOPAMAX) 25 MG tablet, 1 tab po daily x 1 week then 1 tab po BID, Disp: 180 tablet, Rfl: 1  Social History   Tobacco Use  Smoking Status Never Smoker  Smokeless Tobacco Never Used    Allergies  Allergen Reactions  . Peanut Oil Anaphylaxis  . Latex Swelling and Other (See Comments)  . Penicillins Other (See Comments)    Reaction:  Pt states that it "shuts down her bowels and cannot urinate" Has patient had a PCN reaction causing immediate rash, facial/tongue/throat swelling, SOB or lightheadedness with hypotension: No Has patient had a PCN reaction causing severe rash involving mucus membranes or skin necrosis: No Has patient  had a PCN reaction that required hospitalization No Has patient had a PCN reaction occurring within the last 10 years: No If all of the above answers are "NO", then may proceed with Cephalosp Reaction:  Pt states that it "shuts down her bowel movements". Reaction:  Pt states that it "shuts down her bowels and cannot urinate"    Objective:  There were no vitals filed for this visit. There is no height or weight on file to calculate BMI. Constitutional Well developed. Well nourished.  Vascular Dorsalis pedis pulses palpable bilaterally. Posterior tibial pulses palpable bilaterally. Capillary refill normal to all digits.  No cyanosis or clubbing noted. Pedal hair growth normal.  Neurologic  Normal speech. Oriented to person, place, and time. Epicritic sensation to light touch grossly present bilaterally.  Dermatologic Nails well groomed and normal in appearance. No open wounds. No skin lesions.  Orthopedic:  Pain on palpation of right first metatarsophalangeal joint.  Pain with range of motion of the first MPJ.  Limited range of motion noted at the first MPJ.  No crepitus noted.  Deep intra-articular first MPJ pain noted.  No pain at the sesamoidal complex.  No extensor or flexor tendinitis noted.   Radiographs: 3 views of skeletally mature adult right foot: No osseous abnormalities noted.  No bone fractures noted.  Sesamoids are within normal limits.  No soft tissue edema noted. Assessment:   1. Hallux limitus of right foot   2. Capsulitis of metatarsophalangeal (MTP) joint of right foot    Plan:  Patient was evaluated and treated and all questions answered.  Right first MTP capsulitis/hallux limitus -I explained to the patient the etiology of capsulitis and retreatment options were discussed.  Given the amount of pain she is having I believe she will benefit from cam boot immobilization to help decrease acute inflammatory component associated pain.  If there is no improvement we will discuss steroid injection during next clinical visit.  She states understanding. -Prescription for [employer] was given to obtain a cam boot.  I have asked her to place her self in the cam boot for 4 weeks.  I will see her back afterwards and reassess.  No follow-ups on file.

## 2020-07-10 ENCOUNTER — Ambulatory Visit: Payer: Medicaid Other | Admitting: Family

## 2020-07-19 ENCOUNTER — Ambulatory Visit: Payer: Medicaid Other | Admitting: Family Medicine

## 2020-07-26 ENCOUNTER — Encounter: Payer: Self-pay | Admitting: Family Medicine

## 2020-07-29 NOTE — Telephone Encounter (Signed)
Please see message and advise.  Thank you. ° °

## 2020-08-02 ENCOUNTER — Other Ambulatory Visit: Payer: Self-pay

## 2020-08-02 ENCOUNTER — Ambulatory Visit (INDEPENDENT_AMBULATORY_CARE_PROVIDER_SITE_OTHER): Payer: Medicaid Other | Admitting: Podiatry

## 2020-08-02 DIAGNOSIS — M7751 Other enthesopathy of right foot: Secondary | ICD-10-CM | POA: Diagnosis not present

## 2020-08-02 DIAGNOSIS — M205X1 Other deformities of toe(s) (acquired), right foot: Secondary | ICD-10-CM

## 2020-08-02 DIAGNOSIS — L600 Ingrowing nail: Secondary | ICD-10-CM | POA: Diagnosis not present

## 2020-08-07 ENCOUNTER — Encounter: Payer: Self-pay | Admitting: Podiatry

## 2020-08-07 NOTE — Progress Notes (Signed)
Subjective:  Patient ID: Sharon Townsend, female    DOB: February 13, 1991,  MRN: [id number]  Chief Complaint  Patient presents with  . Foot Pain    Right foot pain     30 y.o. female presents with the above complaint.  Patient presents with follow-up to right first metatarsophalangeal joint capsulitis/pain.  She states the cam boot definitely helped her however she still has residual pain.  She states it still hurts when she comes out of the boot. She would like to discuss the next treatment options plan.  She also has complaint of right lateral border of the hallux ingrown.  She states is painful to touch she would like to have removed she has not had to have an ingrown removed in the past.  She denies seeing anyone else prior to seeing me.  It is not clinically infected.   Review of Systems: Negative except as noted in the HPI. Denies N/V/F/Ch.  Past Medical History:  Diagnosis Date  . Bipolar disorder (HCC)   . Chronic constipation   . Gall bladder stones   . Headache   . PONV (postoperative nausea and vomiting)   . Renal disorder    two tubes in left kidney going to bladder    Current Outpatient Medications:  .  ciprofloxacin-dexamethasone (CIPRODEX) OTIC suspension, Place 4 drops into the left ear 2 (two) times daily., Disp: 7.5 mL, Rfl: 0 .  EPINEPHrine 0.3 mg/0.3 mL IJ SOAJ injection, Inject 0.3 mLs (0.3 mg total) into the muscle as needed for anaphylaxis., Disp: 1 each, Rfl: 1 .  ibuprofen (ADVIL) 200 MG tablet, Take 200 mg by mouth every 6 (six) hours as needed., Disp: , Rfl:  .  ibuprofen (ADVIL) 600 MG tablet, Take 1 tablet (600 mg total) by mouth every 6 (six) hours as needed., Disp: 30 tablet, Rfl: 1 .  ibuprofen (ADVIL) 800 MG tablet, Take 1 tablet (800 mg total) by mouth 3 (three) times daily., Disp: 30 tablet, Rfl: 0 .  Melatonin 10 MG TABS, Take by mouth. , Disp: , Rfl:  .  nitrofurantoin, macrocrystal-monohydrate, (MACROBID) 100 MG capsule, Take 1 capsule (100 mg total)  by mouth 2 (two) times daily. (Patient not taking: Reported on 07/04/2020), Disp: 10 capsule, Rfl: 0 .  ondansetron (ZOFRAN) 4 MG tablet, Take 1 tablet (4 mg total) by mouth every 8 (eight) hours as needed for nausea or vomiting., Disp: 20 tablet, Rfl: 0 .  predniSONE (DELTASONE) 20 MG tablet, Take 1 tablet (20 mg total) by mouth 2 (two) times daily with a meal. (Patient not taking: Reported on 07/04/2020), Disp: 10 tablet, Rfl: 0 .  SUMAtriptan (IMITREX) 50 MG tablet, 1-2 tabs po PRN, May repeat x 1 dose in 2 hours if headache persists or recurs., Disp: 20 tablet, Rfl: 1 .  topiramate (TOPAMAX) 25 MG tablet, 1 tab po daily x 1 week then 1 tab po BID, Disp: 180 tablet, Rfl: 1  Social History   Tobacco Use  Smoking Status Never Smoker  Smokeless Tobacco Never Used    Allergies  Allergen Reactions  . Peanut Oil Anaphylaxis  . Latex Swelling and Other (See Comments)  . Penicillins Other (See Comments)    Reaction:  Pt states that it "shuts down her bowels and cannot urinate" Has patient had a PCN reaction causing immediate rash, facial/tongue/throat swelling, SOB or lightheadedness with hypotension: No Has patient had a PCN reaction causing severe rash involving mucus membranes or skin necrosis: No Has patient had a PCN  reaction that required hospitalization No Has patient had a PCN reaction occurring within the last 10 years: No If all of the above answers are "NO", then may proceed with Cephalosp Reaction:  Pt states that it "shuts down her bowel movements". Reaction:  Pt states that it "shuts down her bowels and cannot urinate"    Objective:  There were no vitals filed for this visit. There is no height or weight on file to calculate BMI. Constitutional Well developed. Well nourished.  Vascular Dorsalis pedis pulses palpable bilaterally. Posterior tibial pulses palpable bilaterally. Capillary refill normal to all digits.  No cyanosis or clubbing noted. Pedal hair growth normal.   Neurologic Normal speech. Oriented to person, place, and time. Epicritic sensation to light touch grossly present bilaterally.  Dermatologic  ingrown noted to the right hallux.  Pain on palpation to the ingrown.  Nail dystrophy noted.  No clinical signs of infection noted.  No purulent drainage noted. No open wounds. No skin lesions.  Orthopedic:  Pain on palpation of right first metatarsophalangeal joint.  Pain with range of motion of the first MPJ.  Limited range of motion noted at the first MPJ.  No crepitus noted.  Deep intra-articular first MPJ pain noted.  No pain at the sesamoidal complex.  No extensor or flexor tendinitis noted.   Radiographs: 3 views of skeletally mature adult right foot: No osseous abnormalities noted.  No bone fractures noted.  Sesamoids are within normal limits.  No soft tissue edema noted. Assessment:   1. Hallux limitus of right foot   2. Capsulitis of metatarsophalangeal (MTP) joint of right foot   3. Ingrown toenail of right foot    Plan:  Patient was evaluated and treated and all questions answered.  Right first MTP capsulitis/hallux limitus -I explained to the patient the etiology of capsulitis and retreatment options were discussed.  Clinically cam boot did not resolve most of her pain.  She states it definitely helps however still has some residual pain.  At this time I believe she will benefit from steroid injection help decrease acute inflammatory component associate with pain.  She states understanding to proceed with a steroid injection. -A steroid injection was performed at right first MTPJ using 1% plain Lidocaine and 10 mg of Kenalog. This was well tolerated. -If there is no improvement we will discuss getting an MRI during next visit. -Continue utilizing cam boot. -I discussed shoe gear modification with the patient as well   Ingrown Nail, right -Patient elects to proceed with minor surgery to remove ingrown toenail removal today. Consent  reviewed and signed by patient. -Ingrown nail excised. See procedure note. -Educated on post-procedure care including soaking. Written instructions provided and reviewed. -Patient to follow up in 2 weeks for nail check.  Procedure: Excision of Ingrown Toenail Location: Right 1st toe lateral nail borders. Anesthesia: Lidocaine 1% plain; 1.5 mL and Marcaine 0.5% plain; 1.5 mL, digital block. Skin Prep: Betadine. Dressing: Silvadene; telfa; dry, sterile, compression dressing. Technique: Following skin prep, the toe was exsanguinated and a tourniquet was secured at the base of the toe. The affected nail border was freed, split with a nail splitter, and excised. Chemical matrixectomy was then performed with phenol and irrigated out with alcohol. The tourniquet was then removed and sterile dressing applied. Disposition: Patient tolerated procedure well. Patient to return in 2 weeks for follow-up.   No follow-ups on file.  No follow-ups on file.

## 2020-09-06 ENCOUNTER — Ambulatory Visit: Payer: Medicaid Other | Admitting: Podiatry

## 2020-09-13 ENCOUNTER — Emergency Department (HOSPITAL_COMMUNITY)
Admission: EM | Admit: 2020-09-13 | Discharge: 2020-09-14 | Disposition: A | Discharge status: Home / Self Care | Payer: Medicaid Other | Attending: Emergency Medicine | Admitting: Emergency Medicine

## 2020-09-13 ENCOUNTER — Encounter (HOSPITAL_COMMUNITY): Payer: Self-pay | Admitting: *Deleted

## 2020-09-13 ENCOUNTER — Other Ambulatory Visit: Payer: Self-pay

## 2020-09-13 DIAGNOSIS — Z91040 Latex allergy status: Secondary | ICD-10-CM | POA: Insufficient documentation

## 2020-09-13 DIAGNOSIS — R070 Pain in throat: Secondary | ICD-10-CM | POA: Diagnosis not present

## 2020-09-13 DIAGNOSIS — R0602 Shortness of breath: Secondary | ICD-10-CM | POA: Insufficient documentation

## 2020-09-13 DIAGNOSIS — T7809XA Anaphylactic reaction due to other food products, initial encounter: Secondary | ICD-10-CM | POA: Diagnosis not present

## 2020-09-13 DIAGNOSIS — T7840XA Allergy, unspecified, initial encounter: Secondary | ICD-10-CM

## 2020-09-13 DIAGNOSIS — R0789 Other chest pain: Secondary | ICD-10-CM | POA: Insufficient documentation

## 2020-09-13 DIAGNOSIS — X58XXXA Exposure to other specified factors, initial encounter: Secondary | ICD-10-CM | POA: Insufficient documentation

## 2020-09-13 DIAGNOSIS — Y92511 Restaurant or cafe as the place of occurrence of the external cause: Secondary | ICD-10-CM | POA: Insufficient documentation

## 2020-09-13 DIAGNOSIS — Z91010 Allergy to peanuts: Secondary | ICD-10-CM | POA: Diagnosis not present

## 2020-09-13 DIAGNOSIS — T782XXA Anaphylactic shock, unspecified, initial encounter: Secondary | ICD-10-CM | POA: Diagnosis not present

## 2020-09-13 DIAGNOSIS — R220 Localized swelling, mass and lump, head: Secondary | ICD-10-CM | POA: Diagnosis not present

## 2020-09-13 DIAGNOSIS — Z20822 Contact with and (suspected) exposure to covid-19: Secondary | ICD-10-CM | POA: Diagnosis not present

## 2020-09-13 LAB — CBC WITH DIFFERENTIAL/PLATELET
Abs Immature Granulocytes: 0.02 10*3/uL (ref 0.00–0.07)
Basophils Absolute: 0 10*3/uL (ref 0.0–0.1)
Basophils Relative: 0 %
Eosinophils Absolute: 0.1 10*3/uL (ref 0.0–0.5)
Eosinophils Relative: 1 %
HCT: 39.6 % (ref 36.0–46.0)
Hemoglobin: 13.6 g/dL (ref 12.0–15.0)
Immature Granulocytes: 0 %
Lymphocytes Relative: 27 %
Lymphs Abs: 2.5 10*3/uL (ref 0.7–4.0)
MCH: 30.3 pg (ref 26.0–34.0)
MCHC: 34.3 g/dL (ref 30.0–36.0)
MCV: 88.2 fL (ref 80.0–100.0)
Monocytes Absolute: 0.6 10*3/uL (ref 0.1–1.0)
Monocytes Relative: 7 %
Neutro Abs: 6.1 10*3/uL (ref 1.7–7.7)
Neutrophils Relative %: 65 %
Platelets: 251 10*3/uL (ref 150–400)
RBC: 4.49 MIL/uL (ref 3.87–5.11)
RDW: 11.8 % (ref 11.5–15.5)
WBC: 9.3 10*3/uL (ref 4.0–10.5)
nRBC: 0 % (ref 0.0–0.2)

## 2020-09-13 LAB — BASIC METABOLIC PANEL
Anion gap: 11 (ref 5–15)
BUN: 11 mg/dL (ref 6–20)
CO2: 22 mmol/L (ref 22–32)
Calcium: 9.4 mg/dL (ref 8.9–10.3)
Chloride: 105 mmol/L (ref 98–111)
Creatinine, Ser: 0.96 mg/dL (ref 0.44–1.00)
GFR, Estimated: >60 mL/min (ref >60)
Glucose, Bld: 193 mg/dL — ABNORMAL HIGH (ref 70–99)
Potassium: 4.1 mmol/L (ref 3.5–5.1)
Sodium: 138 mmol/L (ref 135–145)

## 2020-09-13 LAB — I-STAT BETA HCG BLOOD, ED (MC, WL, AP ONLY): I-stat hCG, quantitative: <5 m[IU]/mL (ref <5)

## 2020-09-13 MED ORDER — EPINEPHRINE 0.3 MG/0.3ML IJ SOAJ
0.3000 mg | Freq: Once | INTRAMUSCULAR | Status: AC
  Administered 2020-09-13: 21:00:00 0.3 mg via INTRAMUSCULAR

## 2020-09-13 MED ORDER — EPINEPHRINE 0.3 MG/0.3ML IJ SOAJ
INTRAMUSCULAR | Status: AC
  Filled 2020-09-13: qty 0.3

## 2020-09-13 MED ORDER — FAMOTIDINE IN NACL 20-0.9 MG/50ML-% IV SOLN
20.0000 mg | Freq: Once | INTRAVENOUS | Status: AC
  Administered 2020-09-13: 22:00:00 20 mg via INTRAVENOUS
  Filled 2020-09-13: qty 50

## 2020-09-13 MED ORDER — DEXAMETHASONE SODIUM PHOSPHATE 10 MG/ML IJ SOLN
10.0000 mg | Freq: Once | INTRAMUSCULAR | Status: AC
  Administered 2020-09-13: 22:00:00 10 mg via INTRAVENOUS
  Filled 2020-09-13: qty 1

## 2020-09-13 MED ORDER — DIPHENHYDRAMINE HCL 50 MG/ML IJ SOLN
50.0000 mg | Freq: Once | INTRAMUSCULAR | Status: AC
  Administered 2020-09-13: 22:00:00 50 mg via INTRAVENOUS
  Filled 2020-09-13: qty 1

## 2020-09-13 NOTE — ED Provider Notes (Signed)
[facility] EMERGENCY DEPARTMENT Provider Note   CSN: [id number] Arrival date & time: 09/13/20  2005     History Chief Complaint  Patient presents with   Allergic Reaction    Sharon Townsend is a 30 y.o. female.  Sharon Townsend is a 30 y.o. female with a history of anaphylaxis, bipolar disorder, headaches, and constipation, who presents to the emergency department for evaluation of allergic reaction.  She reports that earlier this evening she went to [employer] and got a [employer]urry, after taking 2 bites that she started to have a throat closing sensation that continued to worsen.  She thinks that the McFlurry must of been contaminated with peanuts.  At 7:50 PM her husband administered an EpiPen to her left thigh.  She reports that her throat started to feel sore and scratchy and she felt like she was having difficulty talking.  She also reports she started to feel like her whole body was hurting and itchy.  She reports that this is typically how she feels with her allergic reactions and has had anaphylaxis before.  Symptoms seem to be improving with epinephrine, but then epinephrine started to wear off and she started to feel worse again with feeling of tightness in her throat and chest, shortness of breath and some swelling and redness in the face.  Patient did not take any additional medications, and symptoms are present to the ED.  On arrival patient was evaluated by provider in triage and a second dose of epinephrine.  Reports with prior episode of anaphylaxis she required 4 doses of epinephrine and was admitted to the hospital, and intubated.  The history is provided by the patient.      Past Medical History:  Diagnosis Date   Bipolar disorder (HCC)    Chronic constipation    Gall bladder stones    Headache    PONV (postoperative nausea and vomiting)    Renal disorder    two tubes in left kidney going to bladder    Patient Active Problem List   Diagnosis  Date Noted   Menorrhagia with regular cycle 12/18/2019   Dyspareunia in female 12/18/2019   Family history of breast cancer 12/18/2019   Family history of ovarian cancer 12/18/2019    Past Surgical History:  Procedure Laterality Date   CESAREAN SECTION N/A 06/14/2016   Procedure: CESAREAN SECTION;  Surgeon: Philip Aspen, DO;  Location: [facility];  Service: Obstetrics;  Laterality: N/A;   child birth  2016   CHOLECYSTECTOMY     DILITATION & CURRETTAGE/HYSTROSCOPY WITH NOVASURE ABLATION N/A 02/21/2020   Procedure: DILATATION & CURETTAGE, WITH NOVASURE ABLATION;  Surgeon: Adam Phenix, MD;  Location: [facility];  Service: Gynecology;  Laterality: N/A;   MANDIBLE SURGERY     renal stents     TONSILLECTOMY     TUBAL LIGATION  06/14/2016     OB History     Gravida  4   Para  3   Term  1   Preterm  2   AB  1   Living  2      SAB  1   IAB  0   Ectopic  0   Multiple  0   Live Births  2           Family History  Problem Relation Age of Onset   Diabetes Mother    Breast cancer Mother 76   Diabetes Father    Hypertension Father  Breast cancer Maternal Grandmother    Ovarian cancer Paternal Grandmother     Social History   Tobacco Use   Smoking status: Never   Smokeless tobacco: Never  Vaping Use   Vaping Use: Never used  Substance Use Topics   Alcohol use: No    Comment: before preg   Drug use: No    Home Medications Prior to Admission medications   Medication Sig Start Date End Date Taking? Authorizing Provider  ciprofloxacin-dexamethasone (CIPRODEX) OTIC suspension Place 4 drops into the left ear 2 (two) times daily. 05/18/20   Freeman Caldron, PA-C  EPINEPHrine 0.3 mg/0.3 mL IJ SOAJ injection Inject 0.3 mLs (0.3 mg total) into the muscle as needed for anaphylaxis. 07/14/19   Cirigliano, Jearld Lesch, DO  ibuprofen (ADVIL) 200 MG tablet Take 200 mg by mouth every 6 (six) hours as needed.    [provider]   ibuprofen (ADVIL) 600 MG tablet Take 1 tablet (600 mg total) by mouth every 6 (six) hours as needed. 02/21/20   Adam Phenix, MD  ibuprofen (ADVIL) 800 MG tablet Take 1 tablet (800 mg total) by mouth 3 (three) times daily. 06/12/20   Particia Nearing, PA-C  Melatonin 10 MG TABS Take by mouth.     [provider]  nitrofurantoin, macrocrystal-monohydrate, (MACROBID) 100 MG capsule Take 1 capsule (100 mg total) by mouth 2 (two) times daily. Patient not taking: Reported on 07/04/2020 05/02/20   Roxy Horseman, PA-C  ondansetron (ZOFRAN) 4 MG tablet Take 1 tablet (4 mg total) by mouth every 8 (eight) hours as needed for nausea or vomiting. 05/25/20   Daphine Deutscher, Mary-Margaret, FNP  predniSONE (DELTASONE) 20 MG tablet Take 1 tablet (20 mg total) by mouth 2 (two) times daily with a meal. Patient not taking: Reported on 07/04/2020 04/02/20   Eustace Moore, MD  SUMAtriptan (IMITREX) 50 MG tablet 1-2 tabs po PRN, May repeat x 1 dose in 2 hours if headache persists or recurs. 07/04/20   Cirigliano, Jearld Lesch, DO  topiramate (TOPAMAX) 25 MG tablet 1 tab po daily x 1 week then 1 tab po BID 07/04/20   Cirigliano, Mary K, DO    Allergies    Peanut oil, Latex, and Penicillins  Review of Systems   Review of Systems  Constitutional:  Negative for chills and fever.  HENT:  Positive for facial swelling, sore throat and trouble swallowing.   Respiratory:  Positive for chest tightness and shortness of breath. Negative for cough.   Cardiovascular:  Negative for chest pain.  Gastrointestinal:  Negative for abdominal pain, nausea and vomiting.  Genitourinary:  Negative for dysuria and frequency.  Musculoskeletal:  Negative for arthralgias and myalgias.  Skin:  Positive for color change. Negative for rash.  Neurological:  Positive for light-headedness. Negative for dizziness and syncope.  All other systems reviewed and are negative.  Physical Exam Updated Vital Signs BP 132/76   Pulse 90   Temp 99.3 F  (37.4 C) (Oral)   Resp (!) 26   Ht 5' (1.524 m)   Wt 90.4 kg   SpO2 99%   BMI 38.92 kg/m   Physical Exam Vitals and nursing note reviewed.  Constitutional:      General: She is not in acute distress.    Appearance: Normal appearance. She is well-developed. She is not diaphoretic.     Comments: Pt appears anxious, but in no acute distress  HENT:     Head: Normocephalic and atraumatic.  Comments: There is some redness over the face, no significant swelling noted    Mouth/Throat:     Comments: Patient with poor effort when trying to open the mouth to examine, but with tongue depressor posterior oropharynx is clear, patient speaks quietly, but is tolerating secretions, no stridor noted Eyes:     General:        Right eye: No discharge.        Left eye: No discharge.  Neck:     Comments: No stridor or tripoding Cardiovascular:     Rate and Rhythm: Normal rate and regular rhythm.     Pulses: Normal pulses.     Heart sounds: Normal heart sounds.  Pulmonary:     Effort: Pulmonary effort is normal. No respiratory distress.     Breath sounds: Normal breath sounds. No wheezing or rales.     Comments: Respirations equal and unlabored, patient able to speak in full sentences, lungs clear to auscultation bilaterally  Abdominal:     General: Bowel sounds are normal. There is no distension.     Palpations: Abdomen is soft. There is no mass.     Tenderness: There is no abdominal tenderness. There is no guarding.     Comments: Abdomen soft, nondistended, nontender to palpation in all quadrants without guarding or peritoneal signs  Musculoskeletal:        General: No deformity.     Cervical back: Neck supple.  Skin:    General: Skin is warm and dry.     Capillary Refill: Capillary refill takes less than 2 seconds.  Neurological:     Mental Status: She is alert and oriented to person, place, and time.     Coordination: Coordination normal.     Comments: Speech is clear, able to follow  commands Moves extremities without ataxia, coordination intact  Psychiatric:        Mood and Affect: Mood normal.        Behavior: Behavior normal.    ED Results / Procedures / Treatments   Labs (all labs ordered are listed, but only abnormal results are displayed) Labs Reviewed  BASIC METABOLIC PANEL - Abnormal; Notable for the following components:      Result Value   Glucose, Bld 193 (*)    All other components within normal limits  RESP PANEL BY RT-PCR (FLU A&B, COVID) ARPGX2  CBC WITH DIFFERENTIAL/PLATELET  I-STAT BETA HCG BLOOD, ED ([facility], [facility], [facility] ONLY)    EKG None  Radiology No results found.  Procedures .Critical Care  Date/Time: 09/13/2020 11:47 PM Performed by: Dartha Lodge, PA-C Authorized by: Dartha Lodge, PA-C   Critical care provider statement:    Critical care time (minutes):  45   Critical care time was exclusive of:  Separately billable procedures and treating other patients   Critical care was necessary to treat or prevent imminent or life-threatening deterioration of the following conditions: Anaphylaxis.   Critical care was time spent personally by me on the following activities:  Discussions with consultants, evaluation of patient's response to treatment, examination of patient, ordering and performing treatments and interventions, ordering and review of laboratory studies, ordering and review of radiographic studies, pulse oximetry, re-evaluation of patient's condition, obtaining history from patient or surrogate and review of old charts   Medications Ordered in ED Medications  EPINEPHrine (EPI-PEN) 0.3 mg/0.3 mL injection (has no administration in time range)  EPINEPHrine (EPI-PEN) injection 0.3 mg (0.3 mg Intramuscular Given 09/13/20 2117)  diphenhydrAMINE (BENADRYL) injection 50  mg (50 mg Intravenous Given 09/13/20 2220)  dexamethasone (DECADRON) injection 10 mg (10 mg Intravenous Given 09/13/20 2220)  famotidine (PEPCID) IVPB 20 mg premix (0 mg  Intravenous Stopped 09/13/20 2254)    ED Course  I have reviewed the triage vital signs and the nursing notes.  Pertinent labs & imaging results that were available during my care of the patient were reviewed by me and considered in my medical decision making (see chart for details).  MDM Rules/Calculators/A&P                         Patient with known peanut allergy, had likely exposure and then began having throat closing sensation and facial swelling, administered epi in the car and symptoms seem to initially improved but then epi began to wear off and patient started to have facial swelling, itching, tight throat and difficulty talking once again.  Given a second dose of epinephrine in triage and now receiving Benadryl, steroids and Pepcid.  Patient complaining of some chest tightness, will check basic labs and EKG.  Previously has required up to 4 doses of epinephrine in the setting of anaphylaxis.  We will continue to observe, may require admission.  At shift change care signed out to Dr. Judd Lienelo who will follow up on labs and EKG and continue to reevaluate patient.  Final Clinical Impression(s) / ED Diagnoses Final diagnoses:  Anaphylaxis, initial encounter    Rx / DC Orders ED Discharge Orders     None        Legrand RamsFord, Melani Brisbane N, PA-C 09/13/20 2349    Geoffery Lyonselo, Douglas, MD 09/14/20 [phone]

## 2020-09-13 NOTE — ED Provider Notes (Addendum)
Emergency Medicine Provider Triage Evaluation Note  Sharon Townsend , a 30 y.o. female  was evaluated in triage.  Pt complains of allergic reaction.  She is allergic to peanuts and had a maxillary that had peanuts in it.  This was at 7:50 PM.  Her husband gave her her EpiPen.  She reports that when she has a reaction she feels like her throat is sore and that her whole body starts to hurt.  This improves with epinephrine.  She feels like that epinephrine is wearing off.  She reports feelings of tightness and shortness of breath in her throat..  Review of Systems  Positive: Shortness of breath, throat swelling Negative: Syncope  Physical Exam  BP 133/86 (BP Location: Left Arm)   Pulse (!) 113   Temp 99.3 F (37.4 C) (Oral)   Resp 16   Ht 5' (1.524 m)   Wt 90.4 kg   SpO2 100%   BMI 38.92 kg/m  Gen:   Awake, no distress   Resp:  Normal effort, voice is hoarse and quiet.  There is mild oral pharyngeal edema.  No stridor. MSK:   Moves extremities without difficulty  Other:  Voice is hoarse, high-pitched, and quiet.  Medical Decision Making  Medically screening exam initiated at 9:16 PM.  Appropriate orders placed.  Sharon Townsend was informed that the remainder of the evaluation will be completed by another provider, this initial triage assessment does not replace that evaluation, and the importance of remaining in the ED until their evaluation is complete.  2110: Patient given epipen by myself in left thigh.  Area cleansed with alcohol prior to injection.  Clinical Course as of 09/13/20 2204  Fri Sep 13, 2020  2110 Gave epi pen [EH]  2202 Patient reevaluated.  She had been feeling better however is now feeling itchy, feeling more short of breath.  Charge RN is aware, patient will be moved to a room in the back. Her blood pressure is in the 110s systolic and her oxygen is 100% on room air. [EH]    Clinical Course User Index [EH] Lottie Rater, PA-C 09/13/20 2203    Cristina Gong, PA-C 09/13/20 2204    Pollyann Savoy, MD 09/13/20 2232

## 2020-09-13 NOTE — ED Notes (Signed)
Mom Sharon Townsend [phone] would like an update

## 2020-09-13 NOTE — ED Triage Notes (Signed)
The pt is allergic to peanuts she had a mcflurry that had peanuts in the  drink  this was at 1950   she gave herself an epi pen  she is c/o some throat soreness at present  lmp  nov  ablaton

## 2020-09-14 LAB — RESP PANEL BY RT-PCR (FLU A&B, COVID) ARPGX2
Influenza A by PCR: NEGATIVE
Influenza B by PCR: NEGATIVE
SARS Coronavirus 2 by RT PCR: NEGATIVE

## 2020-09-14 MED ORDER — PREDNISONE 10 MG PO TABS: 20.0000 mg | ORAL_TABLET | Freq: Two times a day (BID) | ORAL | 0 refills | Status: DC

## 2020-09-14 MED ORDER — EPINEPHRINE 0.3 MG/0.3ML IJ SOAJ: 0.3000 mg | INTRAMUSCULAR | 0 refills | Status: DC | PRN

## 2020-09-14 NOTE — Discharge Instructions (Addendum)
Begin taking prednisone as prescribed.  Begin taking Benadryl, 25 mg every 6 hours for the next 3 days.  Return to the emergency department for worsening swelling, difficulty breathing, or other new and concerning symptoms.

## 2020-09-14 NOTE — ED Notes (Signed)
Patient verbalizes understanding of discharge instructions. Prescriptions reviewed. Opportunity for questioning and answers were provided. Armband removed by staff, pt discharged from ED ambulatory. ° °

## 2020-09-16 ENCOUNTER — Telehealth: Payer: Self-pay

## 2020-09-16 NOTE — Telephone Encounter (Signed)
Transition Care Management Unsuccessful Follow-up Telephone Call  Date of discharge and from where:  09/14/2020 from [facility]  Attempts:  1st Attempt  Reason for unsuccessful TCM follow-up call:  Left voice message

## 2020-09-17 NOTE — Telephone Encounter (Signed)
Transition Care Management Unsuccessful Follow-up Telephone Call  Date of discharge and from where:  09/14/2020 from [place]  Attempts:  2nd Attempt  Reason for unsuccessful TCM follow-up call:  Left voice message    

## 2020-09-18 ENCOUNTER — Ambulatory Visit (INDEPENDENT_AMBULATORY_CARE_PROVIDER_SITE_OTHER): Payer: Medicaid Other

## 2020-09-18 ENCOUNTER — Other Ambulatory Visit: Payer: Self-pay | Admitting: Podiatry

## 2020-09-18 ENCOUNTER — Other Ambulatory Visit: Payer: Self-pay

## 2020-09-18 ENCOUNTER — Ambulatory Visit (INDEPENDENT_AMBULATORY_CARE_PROVIDER_SITE_OTHER): Payer: Medicaid Other | Admitting: Podiatry

## 2020-09-18 DIAGNOSIS — S93601A Unspecified sprain of right foot, initial encounter: Secondary | ICD-10-CM | POA: Diagnosis not present

## 2020-09-18 DIAGNOSIS — S9031XA Contusion of right foot, initial encounter: Secondary | ICD-10-CM | POA: Diagnosis not present

## 2020-09-18 DIAGNOSIS — S99921A Unspecified injury of right foot, initial encounter: Secondary | ICD-10-CM | POA: Diagnosis not present

## 2020-09-18 NOTE — Telephone Encounter (Signed)
Transition Care Management Unsuccessful Follow-up Telephone Call  Date of discharge and from where:  09/14/2020 from [facility]  Attempts:  3rd Attempt  Reason for unsuccessful TCM follow-up call:  Unable to reach patient

## 2020-09-21 ENCOUNTER — Encounter: Payer: Self-pay | Admitting: Podiatry

## 2020-09-24 ENCOUNTER — Encounter: Payer: Self-pay | Admitting: Podiatry

## 2020-09-24 NOTE — Progress Notes (Signed)
Subjective:  Patient ID: Sharon Townsend, female    DOB: June 29, 1990,  MRN: [id number]  Chief Complaint  Patient presents with   Foot Injury    Right foot injury     30 y.o. female presents with the above complaint.  Patient presents with complaint of right foot injury after dog/cat stepped on the first metatarsophalangeal joint.  This was already a problematic joint for her as well.  She states is been going on since yesterday.  The original date of injury wasJune 21, 2022.  She states is been hurting her for quite some time.  She states her pain scale is 8 out of 10 is sharp shooting in nature hurts with ambulation.  She has not been putting using a boot.  She denies any other acute complaints   Review of Systems: Negative except as noted in the HPI. Denies N/V/F/Ch.  Past Medical History:  Diagnosis Date   Bipolar disorder (HCC)    Chronic constipation    Gall bladder stones    Headache    PONV (postoperative nausea and vomiting)    Renal disorder    two tubes in left kidney going to bladder    Current Outpatient Medications:    ciprofloxacin-dexamethasone (CIPRODEX) OTIC suspension, Place 4 drops into the left ear 2 (two) times daily. (Patient not taking: Reported on 09/13/2020), Disp: 7.5 mL, Rfl: 0   EPINEPHrine 0.3 mg/0.3 mL IJ SOAJ injection, Inject 0.3 mg into the muscle as needed for anaphylaxis., Disp: 1 each, Rfl: 0   ibuprofen (ADVIL) 200 MG tablet, Take 600 mg by mouth every 6 (six) hours as needed for headache or mild pain., Disp: , Rfl:    ibuprofen (ADVIL) 600 MG tablet, Take 1 tablet (600 mg total) by mouth every 6 (six) hours as needed. (Patient not taking: Reported on 09/13/2020), Disp: 30 tablet, Rfl: 1   ibuprofen (ADVIL) 800 MG tablet, Take 1 tablet (800 mg total) by mouth 3 (three) times daily. (Patient not taking: Reported on 09/13/2020), Disp: 30 tablet, Rfl: 0   Melatonin 10 MG TABS, Take 10 mg by mouth at bedtime as needed (sleep)., Disp: , Rfl:    Multiple  Vitamins-Minerals (ONE-A-DAY WOMENS PO), Take 1 tablet by mouth at bedtime., Disp: , Rfl:    nitrofurantoin, macrocrystal-monohydrate, (MACROBID) 100 MG capsule, Take 1 capsule (100 mg total) by mouth 2 (two) times daily. (Patient not taking: No sig reported), Disp: 10 capsule, Rfl: 0   Omega-3 Fatty Acids (FISH OIL) 1000 MG CPDR, Take 1,000 mg by mouth at bedtime., Disp: , Rfl:    ondansetron (ZOFRAN) 4 MG tablet, Take 1 tablet (4 mg total) by mouth every 8 (eight) hours as needed for nausea or vomiting., Disp: 20 tablet, Rfl: 0   predniSONE (DELTASONE) 10 MG tablet, Take 2 tablets (20 mg total) by mouth 2 (two) times daily with a meal., Disp: 12 tablet, Rfl: 0   SUMAtriptan (IMITREX) 50 MG tablet, 1-2 tabs po PRN, May repeat x 1 dose in 2 hours if headache persists or recurs., Disp: 20 tablet, Rfl: 1   topiramate (TOPAMAX) 25 MG tablet, 1 tab po daily x 1 week then 1 tab po BID (Patient taking differently: Take 25 mg by mouth 2 (two) times daily.), Disp: 180 tablet, Rfl: 1  Social History   Tobacco Use  Smoking Status Never  Smokeless Tobacco Never    Allergies  Allergen Reactions   Peanut Oil Anaphylaxis   Latex Swelling and Other (See Comments)  Penicillins Other (See Comments)    Reaction:  Pt states that it "shuts down her bowels and cannot urinate" Has patient had a PCN reaction causing immediate rash, facial/tongue/throat swelling, SOB or lightheadedness with hypotension: No Has patient had a PCN reaction causing severe rash involving mucus membranes or skin necrosis: No Has patient had a PCN reaction that required hospitalization No Has patient had a PCN reaction occurring within the last 10 years: No If all of the above answers are "NO", then may proceed with Cephalosp Reaction:  Pt states that it "shuts down her bowel movements". Reaction:  Pt states that it "shuts down her bowels and cannot urinate"    Objective:  There were no vitals filed for this visit. There is no  height or weight on file to calculate BMI. Constitutional Well developed. Well nourished.  Vascular Dorsalis pedis pulses palpable bilaterally. Posterior tibial pulses palpable bilaterally. Capillary refill normal to all digits.  No cyanosis or clubbing noted. Pedal hair growth normal.  Neurologic Normal speech. Oriented to person, place, and time. Epicritic sensation to light touch grossly present bilaterally.  Dermatologic Nails well groomed and normal in appearance. No open wounds. No skin lesions.  Orthopedic: Pain on palpation to the right first metatarsophalangeal joint.  Pain with range of motion of the first MPJ.  No crepitus noted.  No extensor or flexor tendinitis noted.   Radiographs: 3 views of skeletally mature adult right foot:No osseous fractures noted.  No sesamoid fracture noted.  No bony abnormalities identified.  No stress fracture noted. Assessment:   1. Foot injury, right, initial encounter   2. Sprain of foot joint, right, initial encounter    Plan:  Patient was evaluated and treated and all questions answered.  Right foot injury/first MTP sprain - The patient the etiology of foot injury worse treatment options were extensively discussed.  Given that it was already problematic joint in the setting of an acute trauma patient may have damage to soft tissue around the joint.  At this time I believe patient will benefit from complete immobilization of the foot.  I asked her to place her self in a cam boot.  She states understanding will immediately do so. -I will see her back again in 4 to 6 weeks to reassess.  If there is no improvement we will consider doing an MRI  No follow-ups on file.

## 2020-10-06 ENCOUNTER — Telehealth: Payer: Medicaid Other | Admitting: Family

## 2020-10-06 DIAGNOSIS — R21 Rash and other nonspecific skin eruption: Secondary | ICD-10-CM

## 2020-10-06 DIAGNOSIS — R509 Fever, unspecified: Secondary | ICD-10-CM

## 2020-10-06 NOTE — Progress Notes (Signed)
Based on what you shared with me, I feel your condition warrants further evaluation and I recommend that you be seen in a face to face visit.  Given you are having a fever and rash you need to be seen in person.    NOTE: There will be NO CHARGE for this eVisit   If you are having a true medical emergency please call 911.      For an urgent face to face visit, [place] has six urgent care centers for your convenience:     [facility] at [facility] Directions [phone] [address] [place], [place] [address]    [facility]) Get Driving Directions [phone] [address] [place], [place] [address]  [facility] Urgent Care Center [facility]) Get Driving Directions [phone] [address] [place],  [place]  [address]  [facility] Health Urgent Care at [facility] Get Driving Directions [phone] [address], [address] [place], [place] [address]   [facility] Urgent Care at [facility] Get Driving Directions  [phone] [address] [place], [place] [address]   [facility] at [facility] Directions [phone] [address] [place], [place] [address]  Your MyChart E-visit questionnaire answers were reviewed by a board certified advanced clinical practitioner to complete your personal care plan based on your specific symptoms.  Thank you for using e-Visits.

## 2020-10-07 ENCOUNTER — Ambulatory Visit: Payer: Medicaid Other

## 2020-10-18 ENCOUNTER — Ambulatory Visit: Payer: Medicaid Other | Admitting: Podiatry

## 2020-10-21 ENCOUNTER — Telehealth: Payer: Medicaid Other | Admitting: Nurse Practitioner

## 2020-10-21 DIAGNOSIS — R21 Rash and other nonspecific skin eruption: Secondary | ICD-10-CM | POA: Diagnosis not present

## 2020-10-21 MED ORDER — NYSTATIN 100000 UNIT/GM EX CREA: 1.0000 "application " | TOPICAL_CREAM | Freq: Two times a day (BID) | CUTANEOUS | 1 refills | Status: DC

## 2020-10-21 NOTE — Progress Notes (Signed)
E Visit for Rash  We are sorry that you are not feeling well. Here is how we plan to help!  Thank you for providing additional information. We would recommend you stop using topical steroid creams, due to the nature of this rash, how it started and how it appears on images attached it appears it is a yeast infection (fungal).  Based upon your presentation it appears you have a fungal infection.  I have prescribed: and Nystatin cream apply to the affected area twice daily  Fungal infections may take awhile to go away. You should try to keep the area as dry as possible in between applications of cream. Change tops frequently and wear cotton tops/bras.   Continue to use the cream until the rash has resolved, this may take 2-3 weeks. If the rash is not improving after a week you will need a follow up appointment in person to evaluate. If the rash gets worse when applying the cream stop using it and seek follow up earlier.   As long as the rash is same or better continue to use the cream until rash has resolved.  HOME CARE:  Take cool showers and avoid direct sunlight. Take an antihistamine like Benadryl for widespread rashes that itch.  The adult dose of Benadryl is 25-50 mg by mouth 4 times daily. Caution:  This type of medication may cause sleepiness.  Do not drink alcohol, drive, or operate dangerous machinery while taking antihistamines.  Do not take these medications if you have prostate enlargement.  Read package instructions thoroughly on all medications that you take.  GET HELP RIGHT AWAY IF:  Symptoms don't go away after treatment. Severe itching that persists. If you rash spreads or swells. If you rash begins to smell. If it blisters and opens or develops a yellow-brown crust. You develop a fever. You have a sore throat. You become short of breath.  MAKE SURE YOU:  Understand these instructions. Will watch your condition. Will get help right away if you are not doing well or  get worse.  Thank you for choosing an e-visit.  Your e-visit answers were reviewed by a board certified advanced clinical practitioner to complete your personal care plan. Depending upon the condition, your plan could have included both over the counter or prescription medications.  Please review your pharmacy choice. Make sure the pharmacy is open so you can pick up prescription now. If there is a problem, you may contact your provider through [employer] and have the prescription routed to another pharmacy.  Your safety is important to [place]. If you have drug allergies check your prescription carefully.   For the next 24 hours you can use MyChart to ask questions about today's visit, request a non-urgent call back, or ask for a work or school excuse. You will get an email in the next two days asking about your experience. I hope that your e-visit has been valuable and will speed your recovery.   I spent approximately 10 minutes reviewing the patient's history, current symptoms and coordinating their care today.    Meds ordered this encounter  Medications   nystatin cream (MYCOSTATIN)    Sig: Apply 1 application topically 2 (two) times daily.    Dispense:  30 g    Refill:  1

## 2020-10-22 ENCOUNTER — Ambulatory Visit: Payer: Medicaid Other | Admitting: Nurse Practitioner

## 2020-11-08 ENCOUNTER — Telehealth: Payer: Self-pay | Admitting: Family Medicine

## 2020-11-08 ENCOUNTER — Ambulatory Visit: Payer: Medicaid Other

## 2020-11-11 ENCOUNTER — Ambulatory Visit (INDEPENDENT_AMBULATORY_CARE_PROVIDER_SITE_OTHER): Payer: Medicaid Other | Admitting: Family Medicine

## 2020-11-11 ENCOUNTER — Encounter: Payer: Self-pay | Admitting: Family Medicine

## 2020-11-11 ENCOUNTER — Other Ambulatory Visit: Payer: Self-pay

## 2020-11-11 VITALS — BP 115/70 | HR 78 | Temp 98.0°F | Ht 61.0 in | Wt 193.2 lb

## 2020-11-11 DIAGNOSIS — Z0000 Encounter for general adult medical examination without abnormal findings: Secondary | ICD-10-CM

## 2020-11-11 DIAGNOSIS — G43719 Chronic migraine without aura, intractable, without status migrainosus: Secondary | ICD-10-CM | POA: Diagnosis not present

## 2020-11-11 MED ORDER — SUMATRIPTAN SUCCINATE 50 MG PO TABS: ORAL_TABLET | ORAL | 1 refills | Status: DC

## 2020-11-11 NOTE — Progress Notes (Addendum)
Established Patient Office Visit  Subjective:  Patient ID: Sharon Townsend, female    DOB: 1990-09-30  Age: 30 y.o. MRN: [id number]  CC:  Chief Complaint  Patient presents with   Annual Exam    CPE, no concerns. Patient had a bowl of cereal this morning.     HPI Sharon Townsend presents for a physical exam follow-up of her chronic migraines.  Low-dose Topamax has helped a great deal.  Migraines have been coming less frequently.  She would like to continue it.  She would like to become a nurse and work in the ICU.  She has 35 and 48-year-old daughters.  Her exercise is by walking.  She is status post BTL and ablation.  Pap smears are through GYN.  Both parents have diabetes.  She did not develop gestational diabetes.  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  She is 6-1/2 hours fasting.  Past Medical History:  Diagnosis Date   Bipolar disorder (HCC)    Chronic constipation    Gall bladder stones    Headache    PONV (postoperative nausea and vomiting)    Renal disorder    two tubes in left kidney going to bladder    Past Surgical History:  Procedure Laterality Date   CESAREAN SECTION N/A 06/14/2016   Procedure: CESAREAN SECTION;  Surgeon: Philip Aspen, DO;  Location: [facility];  Service: Obstetrics;  Laterality: N/A;   child birth  2016   CHOLECYSTECTOMY     DILITATION & CURRETTAGE/HYSTROSCOPY WITH NOVASURE ABLATION N/A 02/21/2020   Procedure: DILATATION & CURETTAGE, WITH NOVASURE ABLATION;  Surgeon: Adam Phenix, MD;  Location: [facility];  Service: Gynecology;  Laterality: N/A;   MANDIBLE SURGERY     renal stents     TONSILLECTOMY     TUBAL LIGATION  06/14/2016    Family History  Problem Relation Age of Onset   Diabetes Mother    Breast cancer Mother 20   Diabetes Father    Hypertension Father    Breast cancer Maternal Grandmother    Ovarian cancer Paternal Grandmother     Social History   Socioeconomic History   Marital  status: Married    Spouse name: Not on file   Number of children: Not on file   Years of education: Not on file   Highest education level: Not on file  Occupational History   Not on file  Tobacco Use   Smoking status: Never   Smokeless tobacco: Never  Vaping Use   Vaping Use: Never used  Substance and Sexual Activity   Alcohol use: No    Comment: before preg   Drug use: No   Sexual activity: Yes    Partners: Male    Birth control/protection: None  Other Topics Concern   Not on file  Social History Narrative   Not on file   Social Determinants of Health   Financial Resource Strain: Not on file  Food Insecurity: Not on file  Transportation Needs: Not on file  Physical Activity: Not on file  Stress: Not on file  Social Connections: Not on file  Intimate Partner Violence: Not on file    Outpatient Medications Prior to Visit  Medication Sig Dispense Refill   EPINEPHrine 0.3 mg/0.3 mL IJ SOAJ injection Inject 0.3 mg into the muscle as needed for anaphylaxis. 1 each 0   ibuprofen (ADVIL) 200 MG tablet Take 600 mg by mouth every 6 (six) hours as needed for headache or mild  pain.     Multiple Vitamins-Minerals (ONE-A-DAY WOMENS PO) Take 1 tablet by mouth at bedtime.     Omega-3 Fatty Acids (FISH OIL) 1000 MG CPDR Take 1,000 mg by mouth at bedtime.     topiramate (TOPAMAX) 25 MG tablet 1 tab po daily x 1 week then 1 tab po BID (Patient taking differently: Take 25 mg by mouth 2 (two) times daily.) 180 tablet 1   ondansetron (ZOFRAN) 4 MG tablet Take 1 tablet (4 mg total) by mouth every 8 (eight) hours as needed for nausea or vomiting. 20 tablet 0   SUMAtriptan (IMITREX) 50 MG tablet 1-2 tabs po PRN, May repeat x 1 dose in 2 hours if headache persists or recurs. 20 tablet 1   ciprofloxacin-dexamethasone (CIPRODEX) OTIC suspension Place 4 drops into the left ear 2 (two) times daily. (Patient not taking: Reported on 09/13/2020) 7.5 mL 0   ibuprofen (ADVIL) 600 MG tablet Take 1 tablet  (600 mg total) by mouth every 6 (six) hours as needed. (Patient not taking: No sig reported) 30 tablet 1   ibuprofen (ADVIL) 800 MG tablet Take 1 tablet (800 mg total) by mouth 3 (three) times daily. (Patient not taking: No sig reported) 30 tablet 0   Melatonin 10 MG TABS Take 10 mg by mouth at bedtime as needed (sleep).     nitrofurantoin, macrocrystal-monohydrate, (MACROBID) 100 MG capsule Take 1 capsule (100 mg total) by mouth 2 (two) times daily. (Patient not taking: No sig reported) 10 capsule 0   nystatin cream (MYCOSTATIN) Apply 1 application topically 2 (two) times daily. 30 g 1   predniSONE (DELTASONE) 10 MG tablet Take 2 tablets (20 mg total) by mouth 2 (two) times daily with a meal. 12 tablet 0   No facility-administered medications prior to visit.    Allergies  Allergen Reactions   Peanut Oil Anaphylaxis   Latex Swelling and Other (See Comments)   Penicillins Other (See Comments)    Reaction:  Pt states that it "shuts down her bowels and cannot urinate" Has patient had a PCN reaction causing immediate rash, facial/tongue/throat swelling, SOB or lightheadedness with hypotension: No Has patient had a PCN reaction causing severe rash involving mucus membranes or skin necrosis: No Has patient had a PCN reaction that required hospitalization No Has patient had a PCN reaction occurring within the last 10 years: No If all of the above answers are "NO", then may proceed with Cephalosp Reaction:  Pt states that it "shuts down her bowel movements". Reaction:  Pt states that it "shuts down her bowels and cannot urinate"     ROS Review of Systems  Constitutional:  Negative for chills, diaphoresis, fatigue, fever and unexpected weight change.  HENT: Negative.    Eyes:  Negative for photophobia and visual disturbance.  Respiratory: Negative.    Cardiovascular: Negative.   Gastrointestinal: Negative.   Endocrine: Negative for polyphagia and polyuria.  Genitourinary: Negative.    Musculoskeletal:  Negative for gait problem and joint swelling.  Skin: Negative.   Neurological:  Positive for headaches. Negative for speech difficulty and weakness.  Psychiatric/Behavioral: Negative.     Dep[facility] 11/11/2020 11/11/2020 11/30/2019  Decreased Interest 0 0 0  Down, Depressed, Hopeless 0 0 0  PHQ - 2 Score 0 0 0       Objective:    Physical Exam Vitals and nursing note reviewed.  Constitutional:      General: She is not in acute distress.    Appearance: Normal  appearance. She is not ill-appearing, toxic-appearing or diaphoretic.  HENT:     Head: Normocephalic and atraumatic.     Right Ear: Tympanic membrane, ear canal and external ear normal.     Left Ear: Ear canal and external ear normal.     Mouth/Throat:     Mouth: Mucous membranes are moist.     Pharynx: Oropharynx is clear. No oropharyngeal exudate or posterior oropharyngeal erythema.  Eyes:     General: No scleral icterus.       Right eye: No discharge.        Left eye: No discharge.     Conjunctiva/sclera: Conjunctivae normal.     Pupils: Pupils are equal, round, and reactive to light.  Cardiovascular:     Rate and Rhythm: Normal rate and regular rhythm.  Pulmonary:     Effort: Pulmonary effort is normal.     Breath sounds: Normal breath sounds.  Abdominal:     General: Bowel sounds are normal.  Musculoskeletal:     Cervical back: No rigidity or tenderness.     Right lower leg: No edema.     Left lower leg: No edema.  Lymphadenopathy:     Cervical: No cervical adenopathy.  Skin:    General: Skin is warm and dry.  Neurological:     Mental Status: She is alert and oriented to person, place, and time.  Psychiatric:        Mood and Affect: Mood normal.        Behavior: Behavior normal.    BP 115/70 (BP Location: Left Arm, Patient Position: Sitting, Cuff Size: Large)   Pulse 78   Temp 98 F (36.7 C) (Temporal)   Ht 5\' 1"  (1.549 m)   Wt 193 lb 3.2 oz (87.6 kg)   SpO2 98%    BMI 36.50 kg/m  Wt Readings from Last 3 Encounters:  11/11/20 193 lb 3.2 oz (87.6 kg)  09/13/20 199 lb 4.7 oz (90.4 kg)  07/04/20 199 lb 6.4 oz (90.4 kg)     Health Maintenance Due  Topic Date Due   Hepatitis C Screening  Never done   INFLUENZA VACCINE  10/28/2020    There are no preventive care reminders to display for this patient.   Lab Results  Component Value Date   WBC 9.3 09/13/2020   HGB 13.6 09/13/2020   HCT 39.6 09/13/2020   MCV 88.2 09/13/2020   PLT 251 09/13/2020   Lab Results  Component Value Date   NA 138 09/13/2020   K 4.1 09/13/2020   CO2 22 09/13/2020   GLUCOSE 193 (H) 09/13/2020   BUN 11 09/13/2020   CREATININE 0.96 09/13/2020   BILITOT 0.5 06/11/2016   ALKPHOS 157 (H) 06/11/2016   AST 18 11/30/2019   ALT 17 11/30/2019   PROT 6.4 (L) 06/11/2016   ALBUMIN 3.0 (L) 06/11/2016   CALCIUM 9.4 09/13/2020   ANIONGAP 11 09/13/2020   GFR 105.56 11/30/2019   Lab Results  Component Value Date   CHOL 192 11/30/2019   Lab Results  Component Value Date   HDL 38.80 (L) 11/30/2019   Lab Results  Component Value Date   LDLCALC 123 (H) 11/30/2019   Lab Results  Component Value Date   TRIG 151.0 (H) 11/30/2019   Lab Results  Component Value Date   CHOLHDL 5 11/30/2019   No results found for: HGBA1C    Assessment & Plan:   Problem List Items Addressed This Visit   None Visit Diagnoses  Healthcare maintenance    -  Primary   Relevant Orders   Comprehensive metabolic panel   Hemoglobin A1c   Lipid panel   Urinalysis, Routine w reflex microscopic   QuantiFERON-TB Gold Plus   Intractable chronic migraine without aura and without status migrainosus       Relevant Medications   SUMAtriptan (IMITREX) 50 MG tablet       Meds ordered this encounter  Medications   SUMAtriptan (IMITREX) 50 MG tablet    Sig: 1-2 tabs po PRN, May repeat x 1 dose in 2 hours if headache persists or recurs.    Dispense:  20 tablet    Refill:  1     Follow-up: Return in about 1 year (around 11/11/2021).  Given information on health maintenance and disease prevention.  Refill Imitrex to be used as needed.  Encouraged her to keep exercising by walking.  Continue Topamax.  Mliss Sax, MD

## 2020-11-11 NOTE — Addendum Note (Signed)
Addended by: Varney Biles on: 11/11/2020 03:27 PM   Modules accepted: Orders

## 2020-11-12 LAB — LIPID PANEL
Cholesterol: 173 mg/dL (ref 0–200)
HDL: 39.3 mg/dL (ref >39.00)
LDL Cholesterol: 111 mg/dL — ABNORMAL HIGH (ref 0–99)
NonHDL: 133.31
Total CHOL/HDL Ratio: 4
Triglycerides: 113 mg/dL (ref 0.0–149.0)
VLDL: 22.6 mg/dL (ref 0.0–40.0)

## 2020-11-12 LAB — COMPREHENSIVE METABOLIC PANEL
ALT: 16 U/L (ref 0–35)
AST: 20 U/L (ref 0–37)
Albumin: 4.4 g/dL (ref 3.5–5.2)
Alkaline Phosphatase: 68 U/L (ref 39–117)
BUN: 14 mg/dL (ref 6–23)
CO2: 26 mEq/L (ref 19–32)
Calcium: 9.7 mg/dL (ref 8.4–10.5)
Chloride: 103 mEq/L (ref 96–112)
Creatinine, Ser: 0.88 mg/dL (ref 0.40–1.20)
GFR: 88.4 mL/min (ref >60.00)
Glucose, Bld: 77 mg/dL (ref 70–99)
Potassium: 4.1 mEq/L (ref 3.5–5.1)
Sodium: 138 mEq/L (ref 135–145)
Total Bilirubin: 0.3 mg/dL (ref 0.2–1.2)
Total Protein: 7.4 g/dL (ref 6.0–8.3)

## 2020-11-12 LAB — HEMOGLOBIN A1C: Hgb A1c MFr Bld: 5.4 % (ref 4.6–6.5)

## 2020-11-14 LAB — QUANTIFERON-TB GOLD PLUS
Mitogen-NIL: >10 IU/mL
NIL: 0.05 IU/mL
QuantiFERON-TB Gold Plus: NEGATIVE
TB1-NIL: 0.03 IU/mL
TB2-NIL: 0.01 IU/mL

## 2020-11-19 ENCOUNTER — Telehealth: Payer: Self-pay | Admitting: Family Medicine

## 2020-11-19 NOTE — Telephone Encounter (Signed)
Labs printed patient aware and will pick up tomorrow

## 2020-11-19 NOTE — Telephone Encounter (Signed)
Pt called requesting a copy of her CB test results.

## 2020-11-25 NOTE — Telephone Encounter (Signed)
Error

## 2020-11-28 ENCOUNTER — Ambulatory Visit: Payer: Medicaid Other | Admitting: Family

## 2020-12-11 ENCOUNTER — Ambulatory Visit: Payer: Medicaid Other | Admitting: Podiatry

## 2021-01-02 ENCOUNTER — Encounter: Payer: Medicaid Other | Admitting: Family Medicine

## 2021-01-03 ENCOUNTER — Telehealth: Payer: Medicaid Other | Admitting: Nurse Practitioner

## 2021-01-03 DIAGNOSIS — B3731 Acute candidiasis of vulva and vagina: Secondary | ICD-10-CM

## 2021-01-04 MED ORDER — FLUCONAZOLE 150 MG PO TABS: 150.0000 mg | ORAL_TABLET | Freq: Once | ORAL | 0 refills | Status: AC

## 2021-01-04 NOTE — Progress Notes (Signed)
I have spent 5 minutes in review of e-visit questionnaire, review and updating patient chart, medical decision making and response to patient.  ° °Emmory Solivan W Maija Biggers, NP ° °  °

## 2021-01-04 NOTE — Progress Notes (Signed)

## 2021-02-18 ENCOUNTER — Ambulatory Visit
Admission: EM | Admit: 2021-02-18 | Discharge: 2021-02-18 | Disposition: A | Discharge status: Home / Self Care | Payer: Medicaid Other | Attending: Physician Assistant | Admitting: Physician Assistant

## 2021-02-18 ENCOUNTER — Ambulatory Visit (INDEPENDENT_AMBULATORY_CARE_PROVIDER_SITE_OTHER): Payer: Medicaid Other

## 2021-02-18 ENCOUNTER — Other Ambulatory Visit: Payer: Self-pay

## 2021-02-18 DIAGNOSIS — M79674 Pain in right toe(s): Secondary | ICD-10-CM

## 2021-02-18 DIAGNOSIS — S99921A Unspecified injury of right foot, initial encounter: Secondary | ICD-10-CM | POA: Diagnosis not present

## 2021-02-18 NOTE — ED Provider Notes (Signed)
[name]    CSN: [id number] Arrival date & time: 02/18/21  1779      History   Chief Complaint Chief Complaint  Patient presents with   Toe Injury    HPI Sharon Townsend is a 30 y.o. female.   Patient here today for evaluation of injury to her right great toe that occurred earlier today.  She states that she dropped a box of Capri sun juices on her toe.  She is concerned because she had had previous injury to same toe.  She reports some mild numbness to the bottom of her toe but no other numbness.  She does report pain with movement of her toe.  She has not tried any medication for treatment.  The history is provided by the patient.   Past Medical History:  Diagnosis Date   Bipolar disorder (HCC)    Chronic constipation    Gall bladder stones    Headache    PONV (postoperative nausea and vomiting)    Renal disorder    two tubes in left kidney going to bladder    Patient Active Problem List   Diagnosis Date Noted   Menorrhagia with regular cycle 12/18/2019   Dyspareunia in female 12/18/2019   Family history of breast cancer 12/18/2019   Family history of ovarian cancer 12/18/2019    Past Surgical History:  Procedure Laterality Date   CESAREAN SECTION N/A 06/14/2016   Procedure: CESAREAN SECTION;  Surgeon: Philip Aspen, DO;  Location: [facility];  Service: Obstetrics;  Laterality: N/A;   child birth  2016   CHOLECYSTECTOMY     DILITATION & CURRETTAGE/HYSTROSCOPY WITH NOVASURE ABLATION N/A 02/21/2020   Procedure: DILATATION & CURETTAGE, WITH NOVASURE ABLATION;  Surgeon: Adam Phenix, MD;  Location: [facility];  Service: Gynecology;  Laterality: N/A;   MANDIBLE SURGERY     renal stents     TONSILLECTOMY     TUBAL LIGATION  06/14/2016    OB History     Gravida  4   Para  3   Term  1   Preterm  2   AB  1   Living  2      SAB  1   IAB  0   Ectopic  0   Multiple  0   Live Births  2             Home Medications    Prior to Admission medications   Medication Sig Start Date End Date Taking? Authorizing Provider  EPINEPHrine 0.3 mg/0.3 mL IJ SOAJ injection Inject 0.3 mg into the muscle as needed for anaphylaxis. 09/14/20   Geoffery Lyons, MD  ibuprofen (ADVIL) 200 MG tablet Take 600 mg by mouth every 6 (six) hours as needed for headache or mild pain.    [provider]  Multiple Vitamins-Minerals (ONE-A-DAY WOMENS PO) Take 1 tablet by mouth at bedtime.    [provider]  Omega-3 Fatty Acids (FISH OIL) 1000 MG CPDR Take 1,000 mg by mouth at bedtime.    [provider]  SUMAtriptan (IMITREX) 50 MG tablet 1-2 tabs po PRN, May repeat x 1 dose in 2 hours if headache persists or recurs. 11/11/20   Mliss Sax, MD  topiramate (TOPAMAX) 25 MG tablet 1 tab po daily x 1 week then 1 tab po BID Patient taking differently: Take 25 mg by mouth 2 (two) times daily. 07/04/20   Overton Mam, DO    Family History  Family History  Problem Relation Age of Onset   Diabetes Mother    Breast cancer Mother 19   Diabetes Father    Hypertension Father    Breast cancer Maternal Grandmother    Ovarian cancer Paternal Grandmother     Social History Social History   Tobacco Use   Smoking status: Never   Smokeless tobacco: Never  Vaping Use   Vaping Use: Never used  Substance Use Topics   Alcohol use: No    Comment: before preg   Drug use: No     Allergies   Peanut oil, Latex, and Penicillins   Review of Systems Review of Systems  Constitutional:  Negative for chills and fever.  Eyes:  Negative for discharge and redness.  Gastrointestinal:  Negative for abdominal pain, nausea and vomiting.  Genitourinary:  Positive for vaginal bleeding and vaginal discharge.  Musculoskeletal:  Positive for arthralgias and joint swelling.  Skin:  Positive for color change. Negative for wound.    Physical Exam Triage Vital Signs ED Triage Vitals  Enc Vitals  Group     BP 02/18/21 1948 (!) 147/94     Pulse Rate 02/18/21 1948 68     Resp 02/18/21 1948 18     Temp 02/18/21 1948 98.5 F (36.9 C)     Temp Source 02/18/21 1948 Oral     SpO2 02/18/21 1948 98 %     Weight --      Height --      Head Circumference --      Peak Flow --      Pain Score 02/18/21 1950 8     Pain Loc --      Pain Edu? --      Excl. in GC? --    No data found.  Updated Vital Signs BP (!) 147/94 (BP Location: Left Arm)   Pulse 68   Temp 98.5 F (36.9 C) (Oral)   Resp 18   SpO2 98%      Physical Exam Vitals and nursing note reviewed.  Constitutional:      General: She is not in acute distress.    Appearance: Normal appearance. She is not ill-appearing.  HENT:     Head: Normocephalic and atraumatic.  Eyes:     Conjunctiva/sclera: Conjunctivae normal.  Cardiovascular:     Rate and Rhythm: Normal rate.  Pulmonary:     Effort: Pulmonary effort is normal.  Musculoskeletal:     Comments: Decreased ROM of right great toe due to pain.  Mild bruising noted to first MTP with some tenderness to palpation of same.  Skin:    Capillary Refill: Normal cap refill to right great toe Neurological:     Mental Status: She is alert.     Comments: Sensation intact to distal right great toe  Psychiatric:        Mood and Affect: Mood normal.        Behavior: Behavior normal.        Thought Content: Thought content normal.     UC Treatments / Results  Labs (all labs ordered are listed, but only abnormal results are displayed) Labs Reviewed - No data to display  EKG   Radiology DG Toe Great Right  Result Date: 02/18/2021 CLINICAL DATA:  Injury EXAM: RIGHT GREAT TOE COMPARISON:  09/18/2020 FINDINGS: There is no evidence of fracture or dislocation. There is no evidence of arthropathy or other focal bone abnormality. Soft tissues are unremarkable. IMPRESSION: Negative. Electronically Signed  By: Wiliam Ke M.D.   On: 02/18/2021 20:12    Procedures Procedures  (including critical care time)  Medications Ordered in UC Medications - No data to display  Initial Impression / Assessment and Plan / UC Course  I have reviewed the triage vital signs and the nursing notes.  Pertinent labs & imaging results that were available during my care of the patient were reviewed by me and considered in my medical decision making (see chart for details).    No fracture on x-ray.  Recommended ibuprofen for pain.  Encouraged follow-up if symptoms persist over the next week.  Final Clinical Impressions(s) / UC Diagnoses   Final diagnoses:  Great toe pain, right   Discharge Instructions   None    ED Prescriptions   None    PDMP not reviewed this encounter.   Tomi Bamberger, PA-C 02/18/21 2024

## 2021-02-18 NOTE — ED Triage Notes (Signed)
Pt states dropped a case capri suns on rt big toe today. States had an injury to same toe in 3/22 and wore a boot for 2 months.

## 2021-02-28 ENCOUNTER — Ambulatory Visit
Admission: EM | Admit: 2021-02-28 | Discharge: 2021-02-28 | Disposition: A | Discharge status: Home / Self Care | Payer: Medicaid Other | Attending: Internal Medicine | Admitting: Internal Medicine

## 2021-02-28 ENCOUNTER — Other Ambulatory Visit: Payer: Self-pay

## 2021-02-28 DIAGNOSIS — Z20822 Contact with and (suspected) exposure to covid-19: Secondary | ICD-10-CM

## 2021-02-28 DIAGNOSIS — B349 Viral infection, unspecified: Secondary | ICD-10-CM

## 2021-02-28 DIAGNOSIS — R6889 Other general symptoms and signs: Secondary | ICD-10-CM | POA: Diagnosis not present

## 2021-02-28 NOTE — ED Triage Notes (Signed)
Onset yesterday of fever, HA and body aches Tmax 100.2. Has been taking tylenol with relief. Denies cough, congestion, v/d. Pt notes that she was exposed to covid and would like to be tested.

## 2021-02-28 NOTE — ED Provider Notes (Signed)
[name]    CSN: [id number] Arrival date & time: 02/28/21  5277      History   Chief Complaint Chief Complaint  Patient presents with   Covid Exposure   Headache    HPI Sharon Townsend is a 30 y.o. female.   Patient presents with fever, headache, body aches that started yesterday.  T-max at home was 102.  Patient has taken Tylenol with relief of symptoms.  Denies cough, nasal congestion, nausea, vomiting, diarrhea, chest pain, shortness of breath.  Patient reports that she has recently been exposed to COVID-19.   Headache  Past Medical History:  Diagnosis Date   Bipolar disorder (HCC)    Chronic constipation    Gall bladder stones    Headache    PONV (postoperative nausea and vomiting)    Renal disorder    two tubes in left kidney going to bladder    Patient Active Problem List   Diagnosis Date Noted   Menorrhagia with regular cycle 12/18/2019   Dyspareunia in female 12/18/2019   Family history of breast cancer 12/18/2019   Family history of ovarian cancer 12/18/2019    Past Surgical History:  Procedure Laterality Date   CESAREAN SECTION N/A 06/14/2016   Procedure: CESAREAN SECTION;  Surgeon: Philip Aspen, DO;  Location: [facility];  Service: Obstetrics;  Laterality: N/A;   child birth  2016   CHOLECYSTECTOMY     DILITATION & CURRETTAGE/HYSTROSCOPY WITH NOVASURE ABLATION N/A 02/21/2020   Procedure: DILATATION & CURETTAGE, WITH NOVASURE ABLATION;  Surgeon: Adam Phenix, MD;  Location: [facility];  Service: Gynecology;  Laterality: N/A;   MANDIBLE SURGERY     renal stents     TONSILLECTOMY     TUBAL LIGATION  06/14/2016    OB History     Gravida  4   Para  3   Term  1   Preterm  2   AB  1   Living  2      SAB  1   IAB  0   Ectopic  0   Multiple  0   Live Births  2            Home Medications    Prior to Admission medications   Medication Sig Start Date End Date Taking? Authorizing  Provider  EPINEPHrine 0.3 mg/0.3 mL IJ SOAJ injection Inject 0.3 mg into the muscle as needed for anaphylaxis. 09/14/20   Geoffery Lyons, MD  ibuprofen (ADVIL) 200 MG tablet Take 600 mg by mouth every 6 (six) hours as needed for headache or mild pain.    [provider]  Multiple Vitamins-Minerals (ONE-A-DAY WOMENS PO) Take 1 tablet by mouth at bedtime.    [provider]  Omega-3 Fatty Acids (FISH OIL) 1000 MG CPDR Take 1,000 mg by mouth at bedtime.    [provider]  SUMAtriptan (IMITREX) 50 MG tablet 1-2 tabs po PRN, May repeat x 1 dose in 2 hours if headache persists or recurs. 11/11/20   Mliss Sax, MD  topiramate (TOPAMAX) 25 MG tablet 1 tab po daily x 1 week then 1 tab po BID Patient taking differently: Take 25 mg by mouth 2 (two) times daily. 07/04/20   CiriglianoJearld Lesch, DO    Family History Family History  Problem Relation Age of Onset   Diabetes Mother    Breast cancer Mother 2   Diabetes Father    Hypertension Father    Breast cancer  Maternal Grandmother    Ovarian cancer Paternal Grandmother     Social History Social History   Tobacco Use   Smoking status: Never   Smokeless tobacco: Never  Vaping Use   Vaping Use: Never used  Substance Use Topics   Alcohol use: No    Comment: before preg   Drug use: No     Allergies   Peanut oil, Latex, and Penicillins   Review of Systems Review of Systems Per HPI  Physical Exam Triage Vital Signs ED Triage Vitals  Enc Vitals Group     BP 02/28/21 0935 128/84     Pulse Rate 02/28/21 0935 80     Resp 02/28/21 0935 18     Temp 02/28/21 0935 98.4 F (36.9 C)     Temp src --      SpO2 02/28/21 0935 97 %     Weight --      Height --      Head Circumference --      Peak Flow --      Pain Score 02/28/21 0939 8     Pain Loc --      Pain Edu? --      Excl. in GC? --    No data found.  Updated Vital Signs BP 128/84 (BP Location: Left Arm)   Pulse 80   Temp 98.4 F (36.9 C)    Resp 18   SpO2 97%   Visual Acuity Right Eye Distance:   Left Eye Distance:   Bilateral Distance:    Right Eye Near:   Left Eye Near:    Bilateral Near:     Physical Exam Constitutional:      General: She is not in acute distress.    Appearance: Normal appearance. She is not toxic-appearing or diaphoretic.  HENT:     Head: Normocephalic and atraumatic.     Right Ear: Tympanic membrane and ear canal normal.     Left Ear: Tympanic membrane and ear canal normal.     Mouth/Throat:     Mouth: Mucous membranes are moist.     Pharynx: No posterior oropharyngeal erythema.  Eyes:     Extraocular Movements: Extraocular movements intact.     Conjunctiva/sclera: Conjunctivae normal.     Pupils: Pupils are equal, round, and reactive to light.  Cardiovascular:     Rate and Rhythm: Normal rate and regular rhythm.     Pulses: Normal pulses.  Pulmonary:     Effort: Pulmonary effort is normal. No respiratory distress.     Breath sounds: Normal breath sounds.  Abdominal:     General: Bowel sounds are normal. There is no distension.     Tenderness: There is no abdominal tenderness.  Skin:    General: Skin is warm and dry.  Neurological:     General: No focal deficit present.     Mental Status: She is alert and oriented to person, place, and time. Mental status is at baseline.  Psychiatric:        Mood and Affect: Mood normal.        Behavior: Behavior normal.        Thought Content: Thought content normal.        Judgment: Judgment normal.     UC Treatments / Results  Labs (all labs ordered are listed, but only abnormal results are displayed) Labs Reviewed  COVID-19, FLU A+B NAA    EKG   Radiology No results found.  Procedures Procedures (including critical care  time)  Medications Ordered in UC Medications - No data to display  Initial Impression / Assessment and Plan / UC Course  I have reviewed the triage vital signs and the nursing notes.  Pertinent labs &  imaging results that were available during my care of the patient were reviewed by me and considered in my medical decision making (see chart for details).     Patient presents with symptoms likely from a viral upper respiratory infection. Differential includes bacterial pneumonia, sinusitis, allergic rhinitis, Covid 19, flu. Do not suspect underlying cardiopulmonary process. Symptoms seem unlikely related to ACS, CHF or COPD exacerbations, pneumonia, pneumothorax. Patient is nontoxic appearing and not in need of emergent medical intervention.  Highly suspicious for COVID-19 given patient's close exposure.  COVID-19 and flu test pending.  Recommended symptom control with over the counter medications: Daily oral anti-histamine, Oral decongestant or IN corticosteroid, saline irrigations, cepacol lozenges, Robitussin, Delsym, honey tea.  Return if symptoms fail to improve in 1-2 weeks or you develop shortness of breath, chest pain, severe headache. Patient states understanding and is agreeable.  Discharged with PCP followup.  Final Clinical Impressions(s) / UC Diagnoses   Final diagnoses:  Viral infection  Encounter for laboratory testing for COVID-19 virus  Flu-like symptoms     Discharge Instructions      Your COVID-19 and flu test is pending.  We will call if it is positive.    ED Prescriptions   None    PDMP not reviewed this encounter.   Gustavus Bryant, [place] 02/28/21 1006

## 2021-02-28 NOTE — Discharge Instructions (Signed)
Your COVID-19 and flu test is pending.  We will call if it is positive.

## 2021-03-01 LAB — COVID-19, FLU A+B NAA
Influenza A, NAA: NOT DETECTED
Influenza B, NAA: NOT DETECTED
SARS-CoV-2, NAA: NOT DETECTED

## 2021-03-05 ENCOUNTER — Ambulatory Visit: Payer: Medicaid Other | Admitting: Podiatry

## 2021-03-13 ENCOUNTER — Other Ambulatory Visit: Payer: Self-pay

## 2021-03-13 ENCOUNTER — Encounter: Payer: Self-pay | Admitting: Family Medicine

## 2021-03-13 ENCOUNTER — Ambulatory Visit (INDEPENDENT_AMBULATORY_CARE_PROVIDER_SITE_OTHER): Payer: Medicaid Other | Admitting: Family Medicine

## 2021-03-13 VITALS — BP 118/80 | HR 74 | Temp 97.1°F | Ht 61.0 in | Wt 195.6 lb

## 2021-03-13 DIAGNOSIS — F340 Cyclothymic disorder: Secondary | ICD-10-CM | POA: Diagnosis not present

## 2021-03-13 DIAGNOSIS — F418 Other specified anxiety disorders: Secondary | ICD-10-CM | POA: Diagnosis not present

## 2021-03-13 DIAGNOSIS — F5105 Insomnia due to other mental disorder: Secondary | ICD-10-CM

## 2021-03-13 MED ORDER — PAROXETINE HCL 10 MG PO TABS: 10.0000 mg | ORAL_TABLET | Freq: Every day | ORAL | 2 refills | Status: DC

## 2021-03-13 NOTE — Progress Notes (Addendum)
Established Patient Office Visit  Subjective:  Patient ID: Sharon Townsend, female    DOB: [birth year]  Age: 30 y.o. MRN: [id number]  CC:  Chief Complaint  Patient presents with   Insomnia    Pt c/o trouble sleeping x2 months, hx of depression want to discuss treatment options.     HPI Sharon Townsend presents for help with her sadness after the passing of her mother-in-law this past October 30.  MIL passed from complications that have resulted from a fentanyl overdose.  Patient has been sent and is having difficulty sleeping.  Obviously her husband is devastated as well.  Patient has a history of bipolar disorder or cyclothymia in her distant past.  She describes this as periods of happiness alternating with periods of sadness.  Denies any inappropriate behavior associated with her highs.  She does have a history of 2 suicide attempts as a teenager.  She has taken medications in the past but cannot remember if they are.  She is married and lives with her husband and daughters.  Thoughts of self-harm are in her distant past she assures me.  She is seeking help today to prevent that from happening.  She does not smoke, alcohol or use illicit drugs.  Both of her parents were addicts she tells me.  Past Medical History:  Diagnosis Date   Bipolar disorder (HCC)    Chronic constipation    Gall bladder stones    Headache    PONV (postoperative nausea and vomiting)    Renal disorder    two tubes in left kidney going to bladder    Past Surgical History:  Procedure Laterality Date   CESAREAN SECTION N/A 06/14/2016   Procedure: CESAREAN SECTION;  Surgeon: Philip Aspen, DO;  Location: [facility];  Service: Obstetrics;  Laterality: N/A;   child birth  2016   CHOLECYSTECTOMY     DILITATION & CURRETTAGE/HYSTROSCOPY WITH NOVASURE ABLATION N/A 02/21/2020   Procedure: DILATATION & CURETTAGE, WITH NOVASURE ABLATION;  Surgeon: Adam Phenix, MD;  Location: [facility];   Service: Gynecology;  Laterality: N/A;   MANDIBLE SURGERY     renal stents     TONSILLECTOMY     TUBAL LIGATION  06/14/2016    Family History  Problem Relation Age of Onset   Diabetes Mother    Breast cancer Mother 75   Diabetes Father    Hypertension Father    Breast cancer Maternal Grandmother    Ovarian cancer Paternal Grandmother     Social History   Socioeconomic History   Marital status: Married    Spouse name: Not on file   Number of children: Not on file   Years of education: Not on file   Highest education level: Not on file  Occupational History   Not on file  Tobacco Use   Smoking status: Never   Smokeless tobacco: Never  Vaping Use   Vaping Use: Never used  Substance and Sexual Activity   Alcohol use: No    Comment: before preg   Drug use: No   Sexual activity: Yes    Partners: Male    Birth control/protection: None  Other Topics Concern   Not on file  Social History Narrative   Not on file   Social Determinants of Health   Financial Resource Strain: Not on file  Food Insecurity: Not on file  Transportation Needs: Not on file  Physical Activity: Not on file  Stress: Not on file  Social Connections: Not on file  Intimate Partner Violence: Not on file    Outpatient Medications Prior to Visit  Medication Sig Dispense Refill   EPINEPHrine 0.3 mg/0.3 mL IJ SOAJ injection Inject 0.3 mg into the muscle as needed for anaphylaxis. 1 each 0   ibuprofen (ADVIL) 200 MG tablet Take 600 mg by mouth every 6 (six) hours as needed for headache or mild pain.     Multiple Vitamins-Minerals (ONE-A-DAY WOMENS PO) Take 1 tablet by mouth at bedtime.     Omega-3 Fatty Acids (FISH OIL) 1000 MG CPDR Take 1,000 mg by mouth at bedtime.     SUMAtriptan (IMITREX) 50 MG tablet 1-2 tabs po PRN, May repeat x 1 dose in 2 hours if headache persists or recurs. 20 tablet 1   topiramate (TOPAMAX) 25 MG tablet 1 tab po daily x 1 week then 1 tab po BID (Patient taking differently:  Take 25 mg by mouth 2 (two) times daily.) 180 tablet 1   No facility-administered medications prior to visit.    Allergies  Allergen Reactions   Peanut Oil Anaphylaxis   Latex Swelling and Other (See Comments)   Penicillins Other (See Comments)    Reaction:  Pt states that it "shuts down her bowels and cannot urinate" Has patient had a PCN reaction causing immediate rash, facial/tongue/throat swelling, SOB or lightheadedness with hypotension: No Has patient had a PCN reaction causing severe rash involving mucus membranes or skin necrosis: No Has patient had a PCN reaction that required hospitalization No Has patient had a PCN reaction occurring within the last 10 years: No If all of the above answers are "NO", then may proceed with Cephalosp Reaction:  Pt states that it "shuts down her bowel movements". Reaction:  Pt states that it "shuts down her bowels and cannot urinate"     ROS Review of Systems  Constitutional:  Negative for chills, diaphoresis, fatigue, fever and unexpected weight change.  HENT: Negative.    Respiratory: Negative.    Cardiovascular: Negative.   Gastrointestinal: Negative.   Endocrine: Negative for polyphagia and polyuria.  Genitourinary: Negative.   Musculoskeletal:  Negative for gait problem and joint swelling.  Psychiatric/Behavioral:  Positive for dysphoric mood and sleep disturbance. Negative for agitation, behavioral problems, self-injury and suicidal ideas. The patient is nervous/anxious.      Depression screen [facility] 2/9 03/13/2021 11/11/2020 11/11/2020  Decreased Interest 0 0 0  Down, Depressed, Hopeless 1 0 0  PHQ - 2 Score 1 0 0  Altered sleeping 1 - -  Tired, decreased energy 1 - -  Change in appetite 1 - -  Feeling bad or failure about yourself  3 - -  Trouble concentrating 0 - -  Moving slowly or fidgety/restless 1 - -  Suicidal thoughts 0 - -  PHQ-9 Score 8 - -     Objective:    Physical Exam Vitals and nursing note reviewed.   Constitutional:      General: She is not in acute distress.    Appearance: Normal appearance. She is not ill-appearing, toxic-appearing or diaphoretic.  Eyes:     General: No scleral icterus.       Right eye: No discharge.        Left eye: No discharge.     Extraocular Movements: Extraocular movements intact.     Conjunctiva/sclera: Conjunctivae normal.  Pulmonary:     Effort: Pulmonary effort is normal.  Neurological:     Mental Status: She is alert and oriented to  person, place, and time.  Psychiatric:        Mood and Affect: Mood normal.        Behavior: Behavior normal.    BP 118/80 (BP Location: Left Arm, Patient Position: Sitting, Cuff Size: Normal)    Pulse 74    Temp (!) 97.1 F (36.2 C) (Temporal)    Ht 5\' 1"  (1.549 m)    Wt 195 lb 9.6 oz (88.7 kg)    SpO2 98%    BMI 36.96 kg/m  Wt Readings from Last 3 Encounters:  03/13/21 195 lb 9.6 oz (88.7 kg)  11/11/20 193 lb 3.2 oz (87.6 kg)  09/13/20 199 lb 4.7 oz (90.4 kg)     Health Maintenance Due  Topic Date Due   Hepatitis C Screening  Never done   COVID-19 Vaccine (4 - Booster for Pfizer series) 01/18/2020   INFLUENZA VACCINE  10/28/2020    There are no preventive care reminders to display for this patient.   Lab Results  Component Value Date   WBC 9.3 09/13/2020   HGB 13.6 09/13/2020   HCT 39.6 09/13/2020   MCV 88.2 09/13/2020   PLT 251 09/13/2020   Lab Results  Component Value Date   NA 138 11/11/2020   K 4.1 11/11/2020   CO2 26 11/11/2020   GLUCOSE 77 11/11/2020   BUN 14 11/11/2020   CREATININE 0.88 11/11/2020   BILITOT 0.3 11/11/2020   ALKPHOS 68 11/11/2020   AST 20 11/11/2020   ALT 16 11/11/2020   PROT 7.4 11/11/2020   ALBUMIN 4.4 11/11/2020   CALCIUM 9.7 11/11/2020   ANIONGAP 11 09/13/2020   GFR 88.40 11/11/2020   Lab Results  Component Value Date   CHOL 173 11/11/2020   Lab Results  Component Value Date   HDL 39.30 11/11/2020   Lab Results  Component Value Date   LDLCALC 111 (H)  11/11/2020   Lab Results  Component Value Date   TRIG 113.0 11/11/2020   Lab Results  Component Value Date   CHOLHDL 4 11/11/2020   Lab Results  Component Value Date   HGBA1C 5.4 11/11/2020      Assessment & Plan:   Problem List Items Addressed This Visit       Other   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    Dispense:  30 tablet    Refill:  2    Follow-up: Return in about 6 weeks (around 04/24/2021), or if symptoms worsen or fail to improve.  Will start with low-dose Paxil and titrate slowly.  Discussed strategies for taking to help with insomnia.  Given information on mindfulness based stress reduction.  Will consider mood stabilizer augmentation.  Spent 30 minutes with this patient history, discussion and exam.  04/26/2021, MD

## 2021-03-25 ENCOUNTER — Encounter (HOSPITAL_COMMUNITY): Payer: Self-pay | Admitting: Emergency Medicine

## 2021-03-25 ENCOUNTER — Observation Stay (HOSPITAL_COMMUNITY)
Admission: EM | Admit: 2021-03-25 | Discharge: 2021-03-26 | Disposition: A | Discharge status: Home / Self Care | Payer: Medicaid Other | Attending: Internal Medicine | Admitting: Internal Medicine

## 2021-03-25 ENCOUNTER — Other Ambulatory Visit: Payer: Self-pay

## 2021-03-25 DIAGNOSIS — R21 Rash and other nonspecific skin eruption: Secondary | ICD-10-CM | POA: Insufficient documentation

## 2021-03-25 DIAGNOSIS — T782XXA Anaphylactic shock, unspecified, initial encounter: Secondary | ICD-10-CM | POA: Diagnosis not present

## 2021-03-25 DIAGNOSIS — Z91040 Latex allergy status: Secondary | ICD-10-CM | POA: Insufficient documentation

## 2021-03-25 DIAGNOSIS — Z91010 Allergy to peanuts: Secondary | ICD-10-CM | POA: Diagnosis not present

## 2021-03-25 DIAGNOSIS — Z20822 Contact with and (suspected) exposure to covid-19: Secondary | ICD-10-CM | POA: Diagnosis not present

## 2021-03-25 DIAGNOSIS — R000 Tachycardia, unspecified: Secondary | ICD-10-CM | POA: Diagnosis not present

## 2021-03-25 DIAGNOSIS — Z79899 Other long term (current) drug therapy: Secondary | ICD-10-CM | POA: Diagnosis not present

## 2021-03-25 DIAGNOSIS — T781XXA Other adverse food reactions, not elsewhere classified, initial encounter: Secondary | ICD-10-CM | POA: Diagnosis present

## 2021-03-25 MED ORDER — DIPHENHYDRAMINE HCL 50 MG/ML IJ SOLN
25.0000 mg | Freq: Once | INTRAMUSCULAR | Status: AC
  Administered 2021-03-25: 25 mg via INTRAVENOUS
  Filled 2021-03-25: qty 1

## 2021-03-25 MED ORDER — SODIUM CHLORIDE 0.9 % IV BOLUS
1000.0000 mL | Freq: Once | INTRAVENOUS | Status: AC
  Administered 2021-03-25: 1000 mL via INTRAVENOUS

## 2021-03-25 MED ORDER — EPINEPHRINE HCL 5 MG/250ML IV SOLN IN NS
2.0000 ug/min | INTRAVENOUS | Status: DC
Start: 2021-03-26 — End: 2021-03-25

## 2021-03-25 MED ORDER — FAMOTIDINE IN NACL 20-0.9 MG/50ML-% IV SOLN
20.0000 mg | INTRAVENOUS | Status: AC
  Administered 2021-03-25: 20 mg via INTRAVENOUS
  Filled 2021-03-25: qty 50

## 2021-03-25 MED ORDER — METHYLPREDNISOLONE SODIUM SUCC 125 MG IJ SOLR
125.0000 mg | Freq: Once | INTRAMUSCULAR | Status: AC
  Administered 2021-03-25: 125 mg via INTRAVENOUS
  Filled 2021-03-25: qty 2

## 2021-03-25 NOTE — ED Provider Notes (Signed)
Emergency Medicine Provider Triage Evaluation Note  Sharon Townsend , a 30 y.o. female  was evaluated in triage.  Pt complains of allergic reaction.  Has known peanut/tree nut allergy and accidentally ate it in [place].  Used epi pen x2 PTA.  Still feels like she is having trouble swallowing.  Review of Systems  Positive: Allergic reaction Negative: fever  Physical Exam  BP 139/86    Pulse (!) 116    Temp 99 F (37.2 C) (Oral)    Resp 20    SpO2 98%   Gen:   Awake, no distress   Resp:  Normal effort  MSK:   Moves extremities without difficulty  Other:  No noted lip swelling, tongue seems mildly swollen, no swelling of roof/floor of mouth, handling secretions well, no stridor, normal phonation  Medical Decision Making  Medically screening exam initiated at 11:14 PM.  Appropriate orders placed.  Sharon Townsend was informed that the remainder of the evaluation will be completed by another provider, this initial triage assessment does not replace that evaluation, and the importance of remaining in the ED until their evaluation is complete.  Allergic reaction to peanuts.  Used epi x2 already.  Tachy in triage with some tongue swelling.  Charge RN aware, will try to expedite room assignment.  Will start IV and give benadryl, solu-medrol, pepcid.   Garlon Hatchet, PA-C 03/25/21 2318    Dione Booze, MD 03/26/21 [phone]

## 2021-03-25 NOTE — ED Triage Notes (Signed)
Patient ate [place] food with peanuts this evening , reports sudden onset throat tightness unrelieved by 2 doses Epipen self administered by patient prior to arrival .

## 2021-03-26 ENCOUNTER — Encounter (HOSPITAL_COMMUNITY): Payer: Self-pay | Admitting: Internal Medicine

## 2021-03-26 DIAGNOSIS — T782XXA Anaphylactic shock, unspecified, initial encounter: Secondary | ICD-10-CM | POA: Diagnosis not present

## 2021-03-26 LAB — BASIC METABOLIC PANEL
Anion gap: 9 (ref 5–15)
Anion gap: 11 (ref 5–15)
BUN: 10 mg/dL (ref 6–20)
BUN: 9 mg/dL (ref 6–20)
CO2: 21 mmol/L — ABNORMAL LOW (ref 22–32)
CO2: 21 mmol/L — ABNORMAL LOW (ref 22–32)
Calcium: 8.8 mg/dL — ABNORMAL LOW (ref 8.9–10.3)
Calcium: 9.3 mg/dL (ref 8.9–10.3)
Chloride: 106 mmol/L (ref 98–111)
Chloride: 107 mmol/L (ref 98–111)
Creatinine, Ser: 0.95 mg/dL (ref 0.44–1.00)
Creatinine, Ser: 1.02 mg/dL — ABNORMAL HIGH (ref 0.44–1.00)
GFR, Estimated: >60 mL/min (ref >60)
GFR, Estimated: >60 mL/min (ref >60)
Glucose, Bld: 206 mg/dL — ABNORMAL HIGH (ref 70–99)
Glucose, Bld: 177 mg/dL — ABNORMAL HIGH (ref 70–99)
Potassium: 3.4 mmol/L — ABNORMAL LOW (ref 3.5–5.1)
Potassium: 4.1 mmol/L (ref 3.5–5.1)
Sodium: 136 mmol/L (ref 135–145)
Sodium: 139 mmol/L (ref 135–145)

## 2021-03-26 LAB — CBC
HCT: 38.7 % (ref 36.0–46.0)
HCT: 38.7 % (ref 36.0–46.0)
Hemoglobin: 13.2 g/dL (ref 12.0–15.0)
Hemoglobin: 13.4 g/dL (ref 12.0–15.0)
MCH: 30.1 pg (ref 26.0–34.0)
MCH: 30.5 pg (ref 26.0–34.0)
MCHC: 34.1 g/dL (ref 30.0–36.0)
MCHC: 34.6 g/dL (ref 30.0–36.0)
MCV: 88.2 fL (ref 80.0–100.0)
MCV: 88.2 fL (ref 80.0–100.0)
Platelets: 245 10*3/uL (ref 150–400)
Platelets: 259 10*3/uL (ref 150–400)
RBC: 4.39 MIL/uL (ref 3.87–5.11)
RBC: 4.39 MIL/uL (ref 3.87–5.11)
RDW: 11.4 % — ABNORMAL LOW (ref 11.5–15.5)
RDW: 11.7 % (ref 11.5–15.5)
WBC: 10.6 10*3/uL — ABNORMAL HIGH (ref 4.0–10.5)
WBC: 11.8 10*3/uL — ABNORMAL HIGH (ref 4.0–10.5)
nRBC: 0 % (ref 0.0–0.2)
nRBC: 0 % (ref 0.0–0.2)

## 2021-03-26 LAB — RESP PANEL BY RT-PCR (FLU A&B, COVID) ARPGX2
Influenza A by PCR: NEGATIVE
Influenza B by PCR: NEGATIVE
SARS Coronavirus 2 by RT PCR: NEGATIVE

## 2021-03-26 LAB — I-STAT BETA HCG BLOOD, ED (MC, WL, AP ONLY): I-stat hCG, quantitative: <5 m[IU]/mL (ref <5)

## 2021-03-26 LAB — HIV ANTIBODY (ROUTINE TESTING W REFLEX): HIV Screen 4th Generation wRfx: NONREACTIVE

## 2021-03-26 LAB — HEMOGLOBIN A1C
Hgb A1c MFr Bld: 5.2 % (ref 4.8–5.6)
Mean Plasma Glucose: 102.54 mg/dL

## 2021-03-26 LAB — CBG MONITORING, ED: Glucose-Capillary: 175 mg/dL — ABNORMAL HIGH (ref 70–99)

## 2021-03-26 MED ORDER — POTASSIUM CHLORIDE 10 MEQ/100ML IV SOLN
10.0000 meq | Freq: Once | INTRAVENOUS | Status: AC
  Administered 2021-03-26: 05:00:00 10 meq via INTRAVENOUS
  Filled 2021-03-26: qty 100

## 2021-03-26 MED ORDER — SODIUM CHLORIDE 0.9 % IV SOLN: INTRAVENOUS | Status: DC

## 2021-03-26 MED ORDER — PREDNISONE 10 MG PO TABS: ORAL_TABLET | ORAL | 0 refills | Status: DC

## 2021-03-26 MED ORDER — DIPHENHYDRAMINE HCL 50 MG PO TABS: 25.0000 mg | ORAL_TABLET | Freq: Three times a day (TID) | ORAL | 0 refills | Status: DC | PRN

## 2021-03-26 MED ORDER — EPINEPHRINE HCL 5 MG/250ML IV SOLN IN NS
2.0000 ug/min | INTRAVENOUS | Status: DC
Start: 2021-03-26 — End: 2021-03-26
  Filled 2021-03-26: qty 250

## 2021-03-26 MED ORDER — EPINEPHRINE 0.3 MG/0.3ML IJ SOAJ: 0.3000 mg | INTRAMUSCULAR | Status: DC | PRN

## 2021-03-26 MED ORDER — FAMOTIDINE IN NACL 20-0.9 MG/50ML-% IV SOLN
20.0000 mg | Freq: Two times a day (BID) | INTRAVENOUS | Status: DC
  Administered 2021-03-26: 05:00:00 20 mg via INTRAVENOUS
  Filled 2021-03-26: qty 50

## 2021-03-26 MED ORDER — DIPHENHYDRAMINE HCL 50 MG/ML IJ SOLN: 25.0000 mg | Freq: Four times a day (QID) | INTRAMUSCULAR | Status: DC | PRN

## 2021-03-26 MED ORDER — FAMOTIDINE 20 MG PO TABS: 20.0000 mg | ORAL_TABLET | Freq: Every day | ORAL | 0 refills | Status: DC

## 2021-03-26 MED ORDER — DIPHENHYDRAMINE HCL 50 MG/ML IJ SOLN
50.0000 mg | Freq: Once | INTRAMUSCULAR | Status: AC
  Administered 2021-03-26: 50 mg via INTRAVENOUS
  Filled 2021-03-26: qty 1

## 2021-03-26 MED ORDER — METHYLPREDNISOLONE SODIUM SUCC 125 MG IJ SOLR
80.0000 mg | INTRAMUSCULAR | Status: DC
  Administered 2021-03-26: 09:00:00 80 mg via INTRAVENOUS
  Filled 2021-03-26: qty 2

## 2021-03-26 NOTE — ED Notes (Signed)
Lunch Ordered °

## 2021-03-26 NOTE — Progress Notes (Signed)
Patient was seen and examined.  Admitted earlier this morning by Dr. Toniann Fail following a anaphylactic reaction to accidental ingestion of peanuts.  Subjective: Awake/alert-no stridor-some mild throat discomfort but overall appears to be significantly better.  Objective: Blood pressure (!) 96/56, pulse 61, temperature 99 F (37.2 C), temperature source Oral, resp. rate 20, height 5' (1.524 m), weight 100 kg, SpO2 98 %.   Physical exam: Gen Exam:Alert awake-not in any distress.  No stridor. HEENT:atraumatic, normocephalic Chest: B/L clear to auscultation anteriorly CVS:S1S2 regular Abdomen:soft non tender, non distended Extremities:no edema Neurology: Non focal Skin: no rash   Assessment and plan: Anaphylaxis to peanuts: Improved-have asked RN to see if patient can tolerate oral intake-if so-diet can be initiated.  Continue steroids/Benadryl-if continues to be stable throughout the day-suspect home later today.  Bipolar disorder: Appears stable-once diet is stable-medications can be restarted.

## 2021-03-26 NOTE — Discharge Summary (Signed)
PATIENT DETAILS Name: Sharon Townsend Age: 30 y.o. Sex: female Date of Birth: 12-May-1990 MRN: [id number]. Admitting Physician: Eduard Clos, MD NWG:[id number], Talmadge Coventry, MD  Admit Date: 03/25/2021 Discharge date: 03/26/2021  Recommendations for Outpatient Follow-up:  Follow up with PCP in 1-2 weeks   Admitted From:  Home  Disposition: Home   Home Health: No  Equipment/Devices: None  Discharge Condition: Stable  CODE STATUS: FULL CODE  Diet recommendation:  Diet Order             DIET SOFT Room service appropriate? Yes; Fluid consistency: Thin  Diet effective now           Diet general                    Brief Summary: See H&P, Labs, Consult and Test reports for all details in brief, patient is a 30 year old female with history of bipolar disorder-known peanut allergy-who accidentally ingested peanuts while eating [place] food-she subsequently developed throat/tongue swelling-she administered epi x2 before she presented to the hospital-she was given steroids/Benadryl/Pepcid-and subsequently admitted to the hospitalist service.  Brief Hospital Course: Anaphylaxis to peanuts: Feels much better-admitted for observation overnight-has been more than 12 hours since her accidental ingestion-her throat does not feel as irritated as it did yesterday.  On exam-I did not appreciate any tongue/lip/throat swelling.  She was started on some clear liquids-and subsequently advanced to a regular diet.  She feels much better and is now requesting discharge home.  Plan is to continue tapering steroids, Pepcid and as needed Benadryl for a few more days.  She has EpiPen at home.  As noted above-this was a inadvertent/accidental ingestion of peanuts-that was in some [place] food (apparently restaurant was aware of peanut allergy)  Rest of her medical problems were stable during the short overnight hospital stay.  Procedures None  Discharge Diagnoses:  Principal Problem:    Anaphylaxis   Discharge Instructions:  Activity:  As tolerated   Discharge Instructions     Call MD for:  difficulty breathing, headache or visual disturbances   Complete by: As directed    Diet general   Complete by: As directed    Discharge instructions   Complete by: As directed    Follow with Primary MD  Mliss Sax, MD in 1-2 weeks  Please get a complete blood count and chemistry panel checked by your Primary MD at your next visit, and again as instructed by your Primary MD.  Get Medicines reviewed and adjusted: Please take all your medications with you for your next visit with your Primary MD  Laboratory/radiological data: Please request your Primary MD to go over all hospital tests and procedure/radiological results at the follow up, please ask your Primary MD to get all Hospital records sent to his/her office.  In some cases, they will be blood work, cultures and biopsy results pending at the time of your discharge. Please request that your primary care M.D. follows up on these results.  Also Note the following: If you experience worsening of your admission symptoms, develop shortness of breath, life threatening emergency, suicidal or homicidal thoughts you must seek medical attention immediately by calling 911 or calling your MD immediately  if symptoms less severe.  You must read complete instructions/literature along with all the possible adverse reactions/side effects for all the Medicines you take and that have been prescribed to you. Take any new Medicines after you have completely understood and accpet all the possible adverse reactions/side  effects.   Do not drive when taking Pain medications or sleeping medications (Benzodaizepines)  Do not take more than prescribed Pain, Sleep and Anxiety Medications. It is not advisable to combine anxiety,sleep and pain medications without talking with your primary care practitioner  Special Instructions: If you  have smoked or chewed Tobacco  in the last 2 yrs please stop smoking, stop any regular Alcohol  and or any Recreational drug use.  Wear Seat belts while driving.  Please note: You were cared for by a hospitalist during your hospital stay. Once you are discharged, your primary care physician will handle any further medical issues. Please note that NO REFILLS for any discharge medications will be authorized once you are discharged, as it is imperative that you return to your primary care physician (or establish a relationship with a primary care physician if you do not have one) for your post hospital discharge needs so that they can reassess your need for medications and monitor your lab values.   Increase activity slowly   Complete by: As directed       Allergies as of 03/26/2021       Reactions   Peanut Oil Anaphylaxis   Latex Swelling, Other (See Comments)   Penicillins Other (See Comments)   Reaction:  Pt states that it "shuts down her bowels and cannot urinate" Has patient had a PCN reaction causing immediate rash, facial/tongue/throat swelling, SOB or lightheadedness with hypotension: No Has patient had a PCN reaction causing severe rash involving mucus membranes or skin necrosis: No Has patient had a PCN reaction that required hospitalization No Has patient had a PCN reaction occurring within the last 10 years: No If all of the above answers are "NO", then may proceed with Cephalosp Reaction:  Pt states that it "shuts down her bowel movements". Reaction:  Pt states that it "shuts down her bowels and cannot urinate"        Medication List     TAKE these medications    diphenhydrAMINE 50 MG tablet Commonly known as: BENADRYL Take 0.5 tablets (25 mg total) by mouth every 8 (eight) hours as needed for itching or allergies.   EPINEPHrine 0.3 mg/0.3 mL Soaj injection Commonly known as: EPI-PEN Inject 0.3 mg into the muscle as needed for anaphylaxis.   famotidine 20 MG  tablet Commonly known as: Pepcid Take 1 tablet (20 mg total) by mouth daily.   Fish Oil 1000 MG Cpdr Take 1,000 mg by mouth at bedtime.   ibuprofen 200 MG tablet Commonly known as: ADVIL Take 600 mg by mouth every 6 (six) hours as needed for headache or mild pain.   ONE-A-DAY WOMENS PO Take 1 tablet by mouth at bedtime.   PARoxetine 10 MG tablet Commonly known as: Paxil Take 1 tablet (10 mg total) by mouth daily.   predniSONE 10 MG tablet Commonly known as: DELTASONE Take 40 mg daily for 1 day, 30 mg daily for 1 day, 20 mg daily for 1 days,10 mg daily for 1 day, then stop   SUMAtriptan 50 MG tablet Commonly known as: Imitrex 1-2 tabs po PRN, May repeat x 1 dose in 2 hours if headache persists or recurs.   topiramate 25 MG tablet Commonly known as: Topamax 1 tab po daily x 1 week then 1 tab po BID What changed:  how much to take how to take this when to take this additional instructions        Follow-up Information     Nadene Rubins  Alfred, MD. Schedule an appointment as soon as possible for a visit in 1 week(s).   Specialty: Family Medicine Contact information: [address] [place] [address] [phone]                Allergies  Allergen Reactions   Peanut Oil Anaphylaxis   Latex Swelling and Other (See Comments)   Penicillins Other (See Comments)    Reaction:  Pt states that it "shuts down her bowels and cannot urinate" Has patient had a PCN reaction causing immediate rash, facial/tongue/throat swelling, SOB or lightheadedness with hypotension: No Has patient had a PCN reaction causing severe rash involving mucus membranes or skin necrosis: No Has patient had a PCN reaction that required hospitalization No Has patient had a PCN reaction occurring within the last 10 years: No If all of the above answers are "NO", then may proceed with Cephalosp Reaction:  Pt states that it "shuts down her bowel movements". Reaction:  Pt states that  it "shuts down her bowels and cannot urinate"       Consultations: None   Other Procedures/Studies: No results found.   TODAY-DAY OF DISCHARGE:  Subjective:   Sharon Townsend today has no headache,no chest abdominal pain,no new weakness tingling or numbness, feels much better wants to go home today.   Objective:   Blood pressure 119/69, pulse 78, temperature 99 F (37.2 C), temperature source Oral, resp. rate (!) 22, height 5' (1.524 m), weight 100 kg, SpO2 100 %.  Intake/Output Summary (Last 24 hours) at 03/26/2021 1415 Last data filed at 03/26/2021 0630 Gross per 24 hour  Intake 1150 ml  Output --  Net 1150 ml   Filed Weights   03/25/21 2317  Weight: 100 kg    Exam: Awake Alert, Oriented *3, No new F.N deficits, Normal affect [place].AT,PERRAL Supple Neck,No JVD, No cervical lymphadenopathy appriciated.  Symmetrical Chest wall movement, Good air movement bilaterally, CTAB RRR,No Gallops,Rubs or new Murmurs, No Parasternal Heave +ve B.Sounds, Abd Soft, Non tender, No organomegaly appriciated, No rebound -guarding or rigidity. No Cyanosis, Clubbing or edema, No new Rash or bruise   PERTINENT RADIOLOGIC STUDIES: No results found.   PERTINENT LAB RESULTS: CBC: Recent Labs    03/25/21 0046 03/26/21 0451  WBC 11.8* 10.6*  HGB 13.4 13.2  HCT 38.7 38.7  PLT 245 259   CMET CMP     Component Value Date/Time   NA 139 03/26/2021 0451   K 4.1 03/26/2021 0451   CL 107 03/26/2021 0451   CO2 21 (L) 03/26/2021 0451   GLUCOSE 177 (H) 03/26/2021 0451   BUN 9 03/26/2021 0451   CREATININE 1.02 (H) 03/26/2021 0451   CALCIUM 9.3 03/26/2021 0451   PROT 7.4 11/11/2020 1451   ALBUMIN 4.4 11/11/2020 1451   AST 20 11/11/2020 1451   ALT 16 11/11/2020 1451   ALKPHOS 68 11/11/2020 1451   BILITOT 0.3 11/11/2020 1451   GFRNONAA >60 03/26/2021 0451   GFRAA >60 06/11/2016 1722    GFR Estimated Creatinine Clearance: 85.7 mL/min (A) (by C-G formula based on SCr of 1.02  mg/dL (H)). No results for input(s): LIPASE, AMYLASE in the last 72 hours. No results for input(s): CKTOTAL, CKMB, CKMBINDEX, TROPONINI in the last 72 hours. Invalid input(s): POCBNP No results for input(s): DDIMER in the last 72 hours. Recent Labs    03/26/21 0451  HGBA1C 5.2   No results for input(s): CHOL, HDL, LDLCALC, TRIG, CHOLHDL, LDLDIRECT in the last 72 hours. No results for input(s):  TSH, T4TOTAL, T3FREE, THYROIDAB in the last 72 hours.  Invalid input(s): FREET3 No results for input(s): VITAMINB12, FOLATE, FERRITIN, TIBC, IRON, RETICCTPCT in the last 72 hours. Coags: No results for input(s): INR in the last 72 hours.  Invalid input(s): PT Microbiology: Recent Results (from the past 240 hour(s))  Resp Panel by RT-PCR (Flu A&B, Covid) Nasopharyngeal Swab     Status: None   Collection Time: 03/26/21  1:23 AM   Specimen: Nasopharyngeal Swab; Nasopharyngeal(NP) swabs in vial transport medium  Result Value Ref Range Status   SARS Coronavirus 2 by RT PCR NEGATIVE NEGATIVE Final    Comment: (NOTE) SARS-CoV-2 target nucleic acids are NOT DETECTED.  The SARS-CoV-2 RNA is generally detectable in upper respiratory specimens during the acute phase of infection. The lowest concentration of SARS-CoV-2 viral copies this assay can detect is 138 copies/mL. A negative result does not preclude SARS-Cov-2 infection and should not be used as the sole basis for treatment or other patient management decisions. A negative result may occur with  improper specimen collection/handling, submission of specimen other than nasopharyngeal swab, presence of viral mutation(s) within the areas targeted by this assay, and inadequate number of viral copies(<138 copies/mL). A negative result must be combined with clinical observations, patient history, and epidemiological information. The expected result is Negative.  Fact Sheet for Patients:  BloggerCourse.com  Fact Sheet for  Healthcare Providers:  SeriousBroker.it  This test is no t yet approved or cleared by the [place] FDA and  has been authorized for detection and/or diagnosis of SARS-CoV-2 by FDA under an Emergency Use Authorization (EUA). This EUA will remain  in effect (meaning this test can be used) for the duration of the COVID-19 declaration under Section 564(b)(1) of the Act, 21 U.S.C.section 360bbb-3(b)(1), unless the authorization is terminated  or revoked sooner.       Influenza A by PCR NEGATIVE NEGATIVE Final   Influenza B by PCR NEGATIVE NEGATIVE Final    Comment: (NOTE) The Xpert Xpress SARS-CoV-2/FLU/RSV plus assay is intended as an aid in the diagnosis of influenza from Nasopharyngeal swab specimens and should not be used as a sole basis for treatment. Nasal washings and aspirates are unacceptable for Xpert Xpress SARS-CoV-2/FLU/RSV testing.  Fact Sheet for Patients: BloggerCourse.com  Fact Sheet for Healthcare Providers: SeriousBroker.it  This test is not yet approved or cleared by the [place] FDA and has been authorized for detection and/or diagnosis of SARS-CoV-2 by FDA under an Emergency Use Authorization (EUA). This EUA will remain in effect (meaning this test can be used) for the duration of the COVID-19 declaration under Section 564(b)(1) of the Act, 21 U.S.C. section 360bbb-3(b)(1), unless the authorization is terminated or revoked.  Performed at [facility], [address], [place], [place] [address]     FURTHER DISCHARGE INSTRUCTIONS:  Get Medicines reviewed and adjusted: Please take all your medications with you for your next visit with your Primary MD  Laboratory/radiological data: Please request your Primary MD to go over all hospital tests and procedure/radiological results at the follow up, please ask your Primary MD to get all Hospital records sent to his/her  office.  In some cases, they will be blood work, cultures and biopsy results pending at the time of your discharge. Please request that your primary care M.D. goes through all the records of your hospital data and follows up on these results.  Also Note the following: If you experience worsening of your admission symptoms, develop shortness of breath, life  threatening emergency, suicidal or homicidal thoughts you must seek medical attention immediately by calling 911 or calling your MD immediately  if symptoms less severe.  You must read complete instructions/literature along with all the possible adverse reactions/side effects for all the Medicines you take and that have been prescribed to you. Take any new Medicines after you have completely understood and accpet all the possible adverse reactions/side effects.   Do not drive when taking Pain medications or sleeping medications (Benzodaizepines)  Do not take more than prescribed Pain, Sleep and Anxiety Medications. It is not advisable to combine anxiety,sleep and pain medications without talking with your primary care practitioner  Special Instructions: If you have smoked or chewed Tobacco  in the last 2 yrs please stop smoking, stop any regular Alcohol  and or any Recreational drug use.  Wear Seat belts while driving.  Please note: You were cared for by a hospitalist during your hospital stay. Once you are discharged, your primary care physician will handle any further medical issues. Please note that NO REFILLS for any discharge medications will be authorized once you are discharged, as it is imperative that you return to your primary care physician (or establish a relationship with a primary care physician if you do not have one) for your post hospital discharge needs so that they can reassess your need for medications and monitor your lab values.  Total Time spent coordinating discharge including counseling, education and face to face time  equals 25  minutes.  Signed: Jeoffrey Massed 03/26/2021 2:15 PM

## 2021-03-26 NOTE — ED Notes (Signed)
Pt states she feels much better and is ready for discharge. MD aware.

## 2021-03-26 NOTE — ED Provider Notes (Signed)
[facility] EMERGENCY DEPARTMENT Provider Note   CSN: [id number] Arrival date & time: 03/25/21  2221     History Chief Complaint  Patient presents with   Allergic Reaction ( Peanut)    Sharon Townsend is a 30 y.o. female who presents to the ED due to allergic reaction that began @ 21:00.  Patient states that she was eating some [place] food and accidentally ate peanuts which she is allergic to.  She very quickly developed sensation of throat closing, dyspnea, itching/rash to the upper extremities, nausea, vomited once, and became somewhat dizzy.  She gave herself epi pen at 2110, no change in symptoms therefore she gave an additional dose of epinephrine at 21:30 again without change.  Her symptoms have persisted, they do not necessarily feel like they are getting better or worse, she has had some chest tightness as well.  No specific alleviating or aggravating factors.  She denies syncope, abdominal pain, or hematemesis.   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   Menorrhagia with regular cycle 12/18/2019   Dyspareunia in female 12/18/2019   Family history of breast cancer 12/18/2019   Family history of ovarian cancer 12/18/2019    Past Surgical History:  Procedure Laterality Date   CESAREAN SECTION N/A 06/14/2016   Procedure: CESAREAN SECTION;  Surgeon: Philip Aspen, DO;  [facility];  Service: Obstetrics;  Laterality: N/A;   child birth  2016   CHOLECYSTECTOMY     DILITATION & CURRETTAGE/HYSTROSCOPY WITH NOVASURE ABLATION N/A 02/21/2020   Procedure: DILATATION & CURETTAGE, WITH NOVASURE ABLATION;   Surgeon: Adam Phenix, MD;  Location: [facility];  Service: Gynecology;  Laterality: N/A;   MANDIBLE SURGERY     renal stents     TONSILLECTOMY     TUBAL LIGATION  06/14/2016     OB History     Gravida  4   Para  3   Term  1   Preterm  2   AB  1   Living  2      SAB  1   IAB  0   Ectopic  0   Multiple  0   Live Births  2           Family History  Problem Relation Age of Onset   Diabetes Mother    Breast cancer Mother 12   Diabetes Father    Hypertension Father    Breast cancer Maternal Grandmother    Ovarian cancer Paternal Grandmother     Social History   Tobacco Use   Smoking status: Never   Smokeless tobacco: Never  Vaping Use   Vaping Use: Never used  Substance Use Topics   Alcohol use: No    Comment: before preg   Drug use: No    Home Medications Prior to Admission medications   Medication Sig Start Date End Date Taking? Authorizing Provider  EPINEPHrine 0.3 mg/0.3 mL IJ SOAJ injection Inject 0.3 mg into the muscle as needed for anaphylaxis. 09/14/20   Geoffery Lyons, MD  ibuprofen (  ADVIL) 200 MG tablet Take 600 mg by mouth every 6 (six) hours as needed for headache or mild pain.    [provider]  Multiple Vitamins-Minerals (ONE-A-DAY WOMENS PO) Take 1 tablet by mouth at bedtime.    [provider]  Omega-3 Fatty Acids (FISH OIL) 1000 MG CPDR Take 1,000 mg by mouth at bedtime.    [provider]  PARoxetine (PAXIL) 10 MG tablet Take 1 tablet (10 mg total) by mouth daily. 03/13/21   Mliss Sax, MD  SUMAtriptan (IMITREX) 50 MG tablet 1-2 tabs po PRN, May repeat x 1 dose in 2 hours if headache persists or recurs. 11/11/20   Mliss Sax, MD  topiramate (TOPAMAX) 25 MG tablet 1 tab po daily x 1 week then 1 tab po BID Patient taking differently: Take 25 mg by mouth 2 (two) times daily. 07/04/20   Cirigliano, Jearld Lesch, DO    Allergies    Peanut oil, Latex, and Penicillins  Review  of Systems   Review of Systems  Constitutional:  Negative for fever.  HENT:  Positive for trouble swallowing.   Respiratory:  Positive for chest tightness and shortness of breath.   Gastrointestinal:  Positive for nausea and vomiting.  Skin:  Positive for rash.  Neurological:  Positive for dizziness. Negative for seizures and syncope.  All other systems reviewed and are negative.  Physical Exam Updated Vital Signs BP (!) 142/87    Pulse (!) 104    Temp 99 F (37.2 C) (Oral)    Resp 20    Ht 5' (1.524 m)    Wt 100 kg    SpO2 100%    BMI 43.06 kg/m   Physical Exam Vitals and nursing note reviewed.  Constitutional:      General: She is not in acute distress.    Appearance: She is well-developed. She is not toxic-appearing.  HENT:     Head: Normocephalic and atraumatic.     Mouth/Throat:     Comments: Mild tongue swelling, no other appreciable swelling, no posterior oropharyngeal edema, protecting airway and tolerating own secretions at this time.  No lip angioedema. Eyes:     General:        Right eye: No discharge.        Left eye: No discharge.     Conjunctiva/sclera: Conjunctivae normal.  Cardiovascular:     Rate and Rhythm: Normal rate and regular rhythm.  Pulmonary:     Effort: Pulmonary effort is normal. No respiratory distress.     Breath sounds: Normal breath sounds. No stridor. No wheezing, rhonchi or rales.  Abdominal:     General: There is no distension.     Palpations: Abdomen is soft.     Tenderness: There is no abdominal tenderness.  Musculoskeletal:     Cervical back: Neck supple.  Skin:    General: Skin is warm and dry.     Findings: Rash (urticaria to upper extremties.) present.  Neurological:     Mental Status: She is alert.     Comments: Clear speech.   Psychiatric:        Behavior: Behavior normal.    ED Results / Procedures / Treatments   Labs (all labs ordered are listed, but only abnormal results are displayed) Labs Reviewed  CBC - Abnormal;  Notable for the following components:      Result Value   WBC 11.8 (*)    RDW 11.4 (*)    All other components within normal  limits  BASIC METABOLIC PANEL - Abnormal; Notable for the following components:   Potassium 3.4 (*)    CO2 21 (*)    Glucose, Bld 206 (*)    Calcium 8.8 (*)    All other components within normal limits  I-STAT BETA HCG BLOOD, ED ([facility], [facility], [facility] ONLY)    EKG None  Radiology No results found.  Procedures Procedures   Medications Ordered in ED Medications  EPINEPHrine (ADRENALIN) 5 mg in NS 250 mL (0.02 mg/mL) premix infusion (0 mcg/min Intravenous Hold 03/26/21 0028)  methylPREDNISolone sodium succinate (SOLU-MEDROL) 125 mg/2 mL injection 125 mg (125 mg Intravenous Given 03/25/21 2345)  diphenhydrAMINE (BENADRYL) injection 25 mg (25 mg Intravenous Given 03/25/21 2346)  famotidine (PEPCID) IVPB 20 mg premix (0 mg Intravenous Stopped 03/26/21 0021)  sodium chloride 0.9 % bolus 1,000 mL (1,000 mLs Intravenous New Bag/Given 03/25/21 2352)  diphenhydrAMINE (BENADRYL) injection 50 mg (50 mg Intravenous Given 03/26/21 0023)    ED Course  I have reviewed the triage vital signs and the nursing notes.  Pertinent labs & imaging results that were available during my care of the patient were reviewed by me and considered in my medical decision making (see chart for details).    MDM Rules/Calculators/A&P                         Patient presents to the ED with complaints of allergic reaction, this involves an extensive number of treatment options, and is a complaint that carries with it a high risk of complications and morbidity. Nontoxic, vitals w/ tachycardia initially improved to 90bpm on my exam prior to ED intervention.  Clinically patient does have some mild tongue swelling vs a large tongue at baseline, not overtly consistent w/ angioedema as well as some urticaria to her upper extremities, otherwise exam is reassuring.  No posterior oropharyngeal edema, tolerating  own secretions no difficulty at this time and is without stridor.  Steroids, Benadryl, and Pepcid ordered by provider in triage.   Additional history obtained:  External records from outside source obtained and reviewed including prior visits for allergic reaction.  EKG: Mild sinus tachycardia @ 100 bpm.   Cardiac Monitoring:  The patient was maintained on a cardiac monitor.  I personally viewed and interpreted the cardiac monitored which showed an underlying rhythm of sinus rhythm.   Lab Tests:  I Ordered, reviewed, and interpreted labs, pertinent results include:  Preg test: negative CBC: mild leukocytosis BMP: mild hypokalemia/calcemia and hyperglycemia noted.   ED Course:  Following administration of Benadryl, Pepcid, and Solu-Medrol patient remains without significant change in her symptoms.  Performing serial exams remain same.  Will give additional 50 mg of IV Benadryl and plan for close reassessment, holding off on epinephrine drip at this time which was considered.   01:20: RE-EVAL: similar sxs, denies any worsening, similar oropharyngeal exam- no posterior oropharyngeal edema, rash to arms is resolved, felt a little dizzy when standing, will check orthostatic vital signs.   Lying Bp 126/78, HR 84  Sitting 119/70, 88 Standing 128/77, 90Standing 3 min 122/74, 90   Patient with persistent globus sensation/sxs, her physical exam and objective findings are overall reassuring, do not feel that she needs epinephrine infusion at this time- discussed w/ attending who has evaluated the patient & is in agreement.  She has required epinephrine infusions in the past and has had to be admitted for her allergic reactions previously.  Will discuss with hospital service for  admission for observation- patient agreeable.   02:25: CONSULT: Discussed with hospitalist Dr. Toniann Fail, accepts admission.   This is a shared visit with supervising physician Dr. Blinda Leatherwood who has independently evaluated  patient & provided guidance in evaluation/management/disposition, in agreement with care    Portions of this note were generated with Dragon dictation software. Dictation errors may occur despite best attempts at proofreading.     Final Clinical Impression(s) / ED Diagnoses Final diagnoses:  Anaphylaxis, initial encounter    Rx / DC Orders ED Discharge Orders     None        Cherly Anderson, PA-C 03/26/21 0423    Gilda Crease, MD 03/26/21 [phone]

## 2021-03-26 NOTE — H&P (Signed)
History and Physical    LARONICA BHAGAT ZOX:[id number] DOB: 08/22/1990 DOA: 03/25/2021  PCP: Mliss Sax, MD  Patient coming from: Home.  Chief Complaint: Throat tightness and skin rash.  HPI: Sharon Townsend is a 30 y.o. female with history of depression and migraine headaches start developing tightness of the throat tongue swelling and urticarial rash on the upper part of the body after patient inadvertently ate some peanuts when eating [place] food.  Patient gave 2 doses of EpiPen herself 10 minutes apart despite which her throat swelling did not improve.  Patient decided to come to the ER.  ED Course: In the ER patient was given Solu-Medrol Benadryl Pepcid and symptoms gradually improved but still patient complains of some throat tightness.  Initial plan was to start epinephrine infusion but since symptoms are slowly resolving patient admitted for further observation.  COVID test negative.  EKG shows sinus tachycardia.  Labs also show mild hypokalemia and hyperglycemia.  Review of Systems: As per HPI, rest all negative.   Past Medical History:  Diagnosis Date   Bipolar disorder (HCC)    Chronic constipation    Gall bladder stones    Headache    PONV (postoperative nausea and vomiting)    Renal disorder    two tubes in left kidney going to bladder    Past Surgical History:  Procedure Laterality Date   CESAREAN SECTION N/A 06/14/2016   Procedure: CESAREAN SECTION;  Surgeon: Philip Aspen, DO;  [facility]: [facility];  Service: Obstetrics;  Laterality: N/A;   child birth  2016   CHOLECYSTECTOMY     DILITATION & CURRETTAGE/HYSTROSCOPY WITH NOVASURE ABLATION N/A 02/21/2020   Procedure: DILATATION & CURETTAGE, WITH NOVASURE ABLATION;  Surgeon: Adam Phenix, MD;  Location: [facility];  Service: Gynecology;  Laterality: N/A;   MANDIBLE SURGERY     renal stents     TONSILLECTOMY     TUBAL LIGATION  06/14/2016     reports that she has never  smoked. She has never used smokeless tobacco. She reports that she does not drink alcohol and does not use drugs.  Allergies  Allergen Reactions   Peanut Oil Anaphylaxis   Latex Swelling and Other (See Comments)   Penicillins Other (See Comments)    Reaction:  Pt states that it "shuts down her bowels and cannot urinate" Has patient had a PCN reaction causing immediate rash, facial/tongue/throat swelling, SOB or lightheadedness with hypotension: No Has patient had a PCN reaction causing severe rash involving mucus membranes or skin necrosis: No Has patient had a PCN reaction that required hospitalization No Has patient had a PCN reaction occurring within the last 10 years: No If all of the above answers are "NO", then may proceed with Cephalosp Reaction:  Pt states that it "shuts down her bowel movements". Reaction:  Pt states that it "shuts down her bowels and cannot urinate"     Family History  Problem Relation Age of Onset   Diabetes Mother    Breast cancer Mother 52   Diabetes Father    Hypertension Father    Breast cancer Maternal Grandmother    Ovarian cancer Paternal Grandmother     Prior to Admission medications   Medication Sig Start Date End Date Taking? Authorizing Provider  EPINEPHrine 0.3 mg/0.3 mL IJ SOAJ injection Inject 0.3 mg into the muscle as needed for anaphylaxis. 09/14/20  Yes Delo, Riley Lam, MD  ibuprofen (ADVIL) 200 MG tablet Take 600 mg by mouth every  6 (six) hours as needed for headache or mild pain.   Yes [provider]  Multiple Vitamins-Minerals (ONE-A-DAY WOMENS PO) Take 1 tablet by mouth at bedtime.   Yes [provider]  Omega-3 Fatty Acids (FISH OIL) 1000 MG CPDR Take 1,000 mg by mouth at bedtime.   Yes [provider]  PARoxetine (PAXIL) 10 MG tablet Take 1 tablet (10 mg total) by mouth daily. 03/13/21  Yes Mliss Sax, MD  SUMAtriptan (IMITREX) 50 MG tablet 1-2 tabs po PRN, May repeat x 1 dose in 2 hours if  headache persists or recurs. 11/11/20  Yes Mliss Sax, MD  topiramate (TOPAMAX) 25 MG tablet 1 tab po daily x 1 week then 1 tab po BID Patient taking differently: Take 25 mg by mouth 2 (two) times daily. 07/04/20  Yes CiriglianoJearld Lesch, DO    Physical Exam: Constitutional: Moderately built and nourished. Vitals:   03/26/21 0230 03/26/21 0300 03/26/21 0330 03/26/21 0400  BP: 111/77 107/66 105/63 106/67  Pulse: 74 72 69 78  Resp: (!) 29 (!) 26 (!) 29 (!) 32  Temp:      TempSrc:      SpO2: 97% 97% 97% 97%  Weight:      Height:       Eyes: Anicteric no pallor. ENMT: No discharge from the ears eyes nose and mouth. Neck: No stridor. Respiratory: No rhonchi or crepitations. Cardiovascular: S1-S2 heard. Abdomen: Soft nontender bowel sound present. Musculoskeletal: No edema. Skin: No rash. Neurologic: Alert awake oriented to time place and person.  Moves all extremities. Psychiatric: Appears normal.  Normal affect.   Labs on Admission: I have personally reviewed following labs and imaging studies  CBC: Recent Labs  Lab 03/25/21 0046  WBC 11.8*  HGB 13.4  HCT 38.7  MCV 88.2  PLT 245   Basic Metabolic Panel: Recent Labs  Lab 03/25/21 0046  NA 136  K 3.4*  CL 106  CO2 21*  GLUCOSE 206*  BUN 10  CREATININE 0.95  CALCIUM 8.8*   GFR: Estimated Creatinine Clearance: 92 mL/min (by C-G formula based on SCr of 0.95 mg/dL). Liver Function Tests: No results for input(s): AST, ALT, ALKPHOS, BILITOT, PROT, ALBUMIN in the last 168 hours. No results for input(s): LIPASE, AMYLASE in the last 168 hours. No results for input(s): AMMONIA in the last 168 hours. Coagulation Profile: No results for input(s): INR, PROTIME in the last 168 hours. Cardiac Enzymes: No results for input(s): CKTOTAL, CKMB, CKMBINDEX, TROPONINI in the last 168 hours. BNP (last 3 results) No results for input(s): PROBNP in the last 8760 hours. HbA1C: No results for input(s): HGBA1C in the last 72  hours. CBG: No results for input(s): GLUCAP in the last 168 hours. Lipid Profile: No results for input(s): CHOL, HDL, LDLCALC, TRIG, CHOLHDL, LDLDIRECT in the last 72 hours. Thyroid Function Tests: No results for input(s): TSH, T4TOTAL, FREET4, T3FREE, THYROIDAB in the last 72 hours. Anemia Panel: No results for input(s): VITAMINB12, FOLATE, FERRITIN, TIBC, IRON, RETICCTPCT in the last 72 hours. Urine analysis:    Component Value Date/Time   COLORURINE YELLOW 07/13/2018 2056   APPEARANCEUR CLEAR 07/13/2018 2056   LABSPEC >=1.030 11/03/2019 1640   PHURINE 6.5 11/03/2019 1640   GLUCOSEU NEGATIVE 11/03/2019 1640   HGBUR LARGE (A) 11/03/2019 1640   BILIRUBINUR NEGATIVE 11/03/2019 1640   KETONESUR NEGATIVE 11/03/2019 1640   PROTEINUR 100 (A) 11/03/2019 1640   UROBILINOGEN 0.2 11/03/2019 1640   NITRITE NEGATIVE 11/03/2019 1640  LEUKOCYTESUR MODERATE (A) 11/03/2019 1640   Sepsis Labs: @LABRCNTIP (procalcitonin:4,lacticidven:4) ) Recent Results (from the past 240 hour(s))  Resp Panel by RT-PCR (Flu A&B, Covid) Nasopharyngeal Swab     Status: None   Collection Time: 03/26/21  1:23 AM   Specimen: Nasopharyngeal Swab; Nasopharyngeal(NP) swabs in vial transport medium  Result Value Ref Range Status   SARS Coronavirus 2 by RT PCR NEGATIVE NEGATIVE Final    Comment: (NOTE) SARS-CoV-2 target nucleic acids are NOT DETECTED.  The SARS-CoV-2 RNA is generally detectable in upper respiratory specimens during the acute phase of infection. The lowest concentration of SARS-CoV-2 viral copies this assay can detect is 138 copies/mL. A negative result does not preclude SARS-Cov-2 infection and should not be used as the sole basis for treatment or other patient management decisions. A negative result may occur with  improper specimen collection/handling, submission of specimen other than nasopharyngeal swab, presence of viral mutation(s) within the areas targeted by this assay, and inadequate  number of viral copies(<138 copies/mL). A negative result must be combined with clinical observations, patient history, and epidemiological information. The expected result is Negative.  Fact Sheet for Patients:  03/28/21  Fact Sheet for Healthcare Providers:  BloggerCourse.com  This test is no t yet approved or cleared by the [employer]ious[employer] FDA and  has been authorized for detection and/or diagnosis of SARS-CoV-2 by FDA under an Emergency Use Authorization (EUA). This EUA will remain  in effect (meaning this test can be used) for the duration of the COVID-19 declaration under Section 564(b)(1) of the Act, 21 U.S.C.section 360bbb-3(b)(1), unless the authorization is terminated  or revoked sooner.       Influenza A by PCR NEGATIVE NEGATIVE Final   Influenza B by PCR NEGATIVE NEGATIVE Final    Comment: (NOTE) The Xpert Xpress SARS-CoV-2/FLU/RSV plus assay is intended as an aid in the diagnosis of influenza from Nasopharyngeal swab specimens and should not be used as a sole basis for treatment. Nasal washings and aspirates are unacceptable for Xpert Xpress SARS-CoV-2/FLU/RSV testing.  Fact Sheet for Patients: [place]  Fact Sheet for Healthcare Providers: BloggerCourse.com  This test is not yet approved or cleared by the Serious[employer]ker.it FDA and has been authorized for detection and/or diagnosis of SARS-CoV-2 by FDA under an Emergency Use Authorization (EUA). This EUA will remain in effect (meaning this test can be used) for the duration of the COVID-19 declaration under Section 564(b)(1) of the Act, 21 U.S.C. section 360bbb-3(b)(1), unless the authorization is terminated or revoked.  Performed at [facility], [address] [place], [place]      Radiological Exams on Admission: No results found.  EKG: Independently reviewed.  Sinus  tachycardia.  Assessment/Plan Principal Problem:   Anaphylaxis    Anaphylaxis to peanuts -patient herself had administered 2 dose of EpiPen and in the ER patient received Solu-Medrol Pepcid and Benadryl.  Patient still feels throat slightly tight but on exam has no stridor and patient is able to talk without difficulty.  On my exam there is no tongue swelling or wheezing.  We will continue with steroids Pepcid and Benadryl and closely monitor in stepdown.  For now we will keep patient n.p.o. History of depression on Paxil presently NPO. History of migraine on Topamax presently NPO. Hyperglycemia check hemoglobin A1c. Mild hypokalemia replace and recheck.   DVT prophylaxis: SCDs.  Avoiding anticoagulation since patient has anaphylaxis and may need procedure if symptoms worsen. Code Status: Full code. Family Communication: Discussed with patient. Disposition  Plan: Home when stable. Consults called: None. Admission status: Observation.   Eduard Clos MD [facility] Pager [phone].  If 7PM-7AM, please contact night-coverage [email or web address] Password [facility]  03/26/2021, 4:32 AM

## 2021-03-27 ENCOUNTER — Telehealth: Payer: Self-pay

## 2021-03-27 NOTE — Telephone Encounter (Signed)
Transition Care Management Unsuccessful Follow-up Telephone Call  Date of discharge and from where:  03/26/2021 from [facility]  Attempts:  1st Attempt  Reason for unsuccessful TCM follow-up call:  Left voice message

## 2021-03-28 NOTE — Telephone Encounter (Signed)
Transition Care Management Unsuccessful Follow-up Telephone Call  Date of discharge and from where:  03/26/2021 from [facility]  Attempts:  2nd Attempt  Reason for unsuccessful TCM follow-up call:  Left voice message

## 2021-03-31 NOTE — Telephone Encounter (Signed)
Transition Care Management Unsuccessful Follow-up Telephone Call  Date of discharge and from where:  03/26/2021 from [facility]  Attempts:  3rd Attempt  Reason for unsuccessful TCM follow-up call:  Unable to reach patient

## 2021-04-07 ENCOUNTER — Telehealth: Payer: Self-pay

## 2021-04-07 NOTE — Telephone Encounter (Signed)
04/07/2021  Patient was on the EMMI report with red flags.   Unable to reach patient.

## 2021-04-08 ENCOUNTER — Ambulatory Visit: Payer: Medicaid Other | Admitting: Family Medicine

## 2021-04-19 ENCOUNTER — Ambulatory Visit: Payer: Medicaid Other

## 2021-04-25 ENCOUNTER — Ambulatory Visit: Payer: Medicaid Other | Admitting: Family Medicine

## 2021-04-25 ENCOUNTER — Telehealth: Payer: Self-pay | Admitting: Family Medicine

## 2021-04-25 NOTE — Telephone Encounter (Signed)
Pt did not show up for her appointment. 04/25/21 °

## 2021-04-28 ENCOUNTER — Telehealth: Payer: Medicaid Other | Admitting: Family Medicine

## 2021-04-28 ENCOUNTER — Encounter: Payer: Self-pay | Admitting: Family Medicine

## 2021-04-28 VITALS — Ht 60.0 in | Wt 200.0 lb

## 2021-04-28 DIAGNOSIS — F418 Other specified anxiety disorders: Secondary | ICD-10-CM | POA: Diagnosis not present

## 2021-04-28 DIAGNOSIS — F5105 Insomnia due to other mental disorder: Secondary | ICD-10-CM | POA: Diagnosis not present

## 2021-04-28 MED ORDER — PAROXETINE HCL 20 MG PO TABS: 20.0000 mg | ORAL_TABLET | Freq: Every day | ORAL | 2 refills | Status: DC

## 2021-04-28 NOTE — Progress Notes (Signed)
Established Patient Office Visit  Subjective:  Patient ID: Sharon Townsend, female    DOB: 18-Nov-1990  Age: 31 y.o. MRN: [id number]  CC:  Chief Complaint  Patient presents with   Follow-up    Follow up on medication. No concerns.     HPI [place] presents for follow-up of depression with insomnia and anxiety.  Paxil seems to be helping.  She is taking it at 7 PM pends helping with sleep.  Still feeling some anxiety.  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  Things are okay at home.  Drug is a green weather.  She denies hyperactivity or any concern about too much elevation with her mood.  Past Medical History:  Diagnosis Date   Bipolar disorder ([place])    Chronic constipation    Gall bladder stones    Headache    PONV (postoperative nausea and vomiting)    Renal disorder    two tubes in left kidney going to bladder    Past Surgical History:  Procedure Laterality Date   CESAREAN SECTION N/A 06/14/2016   Procedure: CESAREAN SECTION;  Surgeon: Allyn Kenner, DO;  Location: [place] [facility];  Service: Obstetrics;  Laterality: N/A;   child birth  2016   [place] N/A 02/21/2020   Procedure: [place], WITH NOVASURE ABLATION;  Surgeon: Woodroe Mode, MD;  Location: [place];  Service: Gynecology;  Laterality: N/A;   MANDIBLE SURGERY     renal stents     TONSILLECTOMY     TUBAL LIGATION  06/14/2016    Family History  Problem Relation Age of Onset   Diabetes Mother    Breast cancer Mother 80   Diabetes Father    Hypertension Father    Breast cancer Maternal Grandmother    Ovarian cancer Paternal Grandmother     Social History   Socioeconomic History   Marital status: Married    Spouse name: Not on file   Number of children: Not on file   Years of education: Not on file   Highest education level: Not on file   Occupational History   Not on file  Tobacco Use   Smoking status: Never   Smokeless tobacco: Never  Vaping Use   Vaping Use: Never used  Substance and Sexual Activity   Alcohol use: No    Comment: before preg   Drug use: No   Sexual activity: Yes    Partners: Male    Birth control/protection: None  Other Topics Concern   Not on file  Social History Narrative   Not on file   Social Determinants of Health   Financial Resource Strain: Not on file  Food Insecurity: Not on file  Transportation Needs: Not on file  Physical Activity: Not on file  Stress: Not on file  Social Connections: Not on file  Intimate Partner Violence: Not on file    Outpatient Medications Prior to Visit  Medication Sig Dispense Refill   EPINEPHrine 0.3 mg/0.3 mL IJ SOAJ injection Inject 0.3 mg into the muscle as needed for anaphylaxis. 1 each 0   ibuprofen (ADVIL) 200 MG tablet Take 600 mg by mouth every 6 (six) hours as needed for headache or mild pain.     Multiple Vitamins-Minerals (ONE-A-DAY WOMENS PO) Take 1 tablet by mouth at bedtime.     Omega-3 Fatty Acids (FISH OIL) 1000 MG CPDR  Take 1,000 mg by mouth at bedtime.     SUMAtriptan (IMITREX) 50 MG tablet 1-2 tabs po PRN, May repeat x 1 dose in 2 hours if headache persists or recurs. 20 tablet 1   topiramate (TOPAMAX) 25 MG tablet 1 tab po daily x 1 week then 1 tab po BID (Patient taking differently: Take 25 mg by mouth 2 (two) times daily.) 180 tablet 1   PARoxetine (PAXIL) 10 MG tablet Take 1 tablet (10 mg total) by mouth daily. 30 tablet 2   diphenhydrAMINE (BENADRYL) 50 MG tablet Take 0.5 tablets (25 mg total) by mouth every 8 (eight) hours as needed for itching or allergies. 25 tablet 0   famotidine (PEPCID) 20 MG tablet Take 1 tablet (20 mg total) by mouth daily. 5 tablet 0   predniSONE (DELTASONE) 10 MG tablet Take 40 mg daily for 1 day, 30 mg daily for 1 day, 20 mg daily for 1 days,10 mg daily for 1 day, then stop 10 tablet 0   No  facility-administered medications prior to visit.    Allergies  Allergen Reactions   Peanut Oil Anaphylaxis   Latex Swelling and Other (See Comments)   Penicillins Other (See Comments)    Reaction:  Pt states that it "shuts down her bowels and cannot urinate" Has patient had a PCN reaction causing immediate rash, facial/tongue/throat swelling, SOB or lightheadedness with hypotension: No Has patient had a PCN reaction causing severe rash involving mucus membranes or skin necrosis: No Has patient had a PCN reaction that required hospitalization No Has patient had a PCN reaction occurring within the last 10 years: No If all of the above answers are "NO", then may proceed with Cephalosp Reaction:  Pt states that it "shuts down her bowel movements". Reaction:  Pt states that it "shuts down her bowels and cannot urinate"     ROS Review of Systems  Constitutional:  Negative for diaphoresis, fatigue, fever and unexpected weight change.  Respiratory: Negative.    Cardiovascular: Negative.   Gastrointestinal: Negative.   Psychiatric/Behavioral:  Negative for behavioral problems and dysphoric mood.      Depression screen [facility] 2/9 04/28/2021 04/28/2021 03/13/2021  Decreased Interest 0 0 0  Down, Depressed, Hopeless 0 0 1  PHQ - 2 Score 0 0 1  Altered sleeping 1 - 1  Tired, decreased energy 1 - 1  Change in appetite 0 - 1  Feeling bad or failure about yourself  1 - 3  Trouble concentrating 0 - 0  Moving slowly or fidgety/restless 0 - 1  Suicidal thoughts 0 - 0  PHQ-9 Score 3 - 8  Difficult doing work/chores Not difficult at all - -     Objective:    Physical Exam Vitals and nursing note reviewed.  Constitutional:      General: She is not in acute distress.    Appearance: Normal appearance. She is not ill-appearing, toxic-appearing or diaphoretic.  HENT:     Right Ear: External ear normal.     Left Ear: External ear normal.  Eyes:     Conjunctiva/sclera: Conjunctivae normal.   Neurological:     Mental Status: She is alert and oriented to person, place, and time.  Psychiatric:        Mood and Affect: Mood normal.        Behavior: Behavior normal.    Ht 5' (1.524 m)    Wt 200 lb (90.7 kg)    BMI 39.06 kg/m  Wt Readings from Last 3  Encounters:  04/28/21 200 lb (90.7 kg)  03/25/21 220 lb 7.4 oz (100 kg)  03/13/21 195 lb 9.6 oz (88.7 kg)     Health Maintenance Due  Topic Date Due   Hepatitis C Screening  Never done   COVID-19 Vaccine (4 - Booster for Pfizer series) 01/18/2020    There are no preventive care reminders to display for this patient.   Lab Results  Component Value Date   WBC 10.6 (H) 03/26/2021   HGB 13.2 03/26/2021   HCT 38.7 03/26/2021   MCV 88.2 03/26/2021   PLT 259 03/26/2021   Lab Results  Component Value Date   NA 139 03/26/2021   K 4.1 03/26/2021   CO2 21 (L) 03/26/2021   GLUCOSE 177 (H) 03/26/2021   BUN 9 03/26/2021   CREATININE 1.02 (H) 03/26/2021   BILITOT 0.3 11/11/2020   ALKPHOS 68 11/11/2020   AST 20 11/11/2020   ALT 16 11/11/2020   PROT 7.4 11/11/2020   ALBUMIN 4.4 11/11/2020   CALCIUM 9.3 03/26/2021   ANIONGAP 11 03/26/2021   GFR 88.40 11/11/2020   Lab Results  Component Value Date   CHOL 173 11/11/2020   Lab Results  Component Value Date   HDL 39.30 11/11/2020   Lab Results  Component Value Date   LDLCALC 111 (H) 11/11/2020   Lab Results  Component Value Date   TRIG 113.0 11/11/2020   Lab Results  Component Value Date   CHOLHDL 4 11/11/2020   Lab Results  Component Value Date   HGBA1C 5.2 03/26/2021      Assessment & Plan:   Problem List Items Addressed This Visit       Other   Insomnia secondary to depression with anxiety - Primary   Relevant Medications   PARoxetine (PAXIL) 20 MG tablet    Meds ordered this encounter  Medications   PARoxetine (PAXIL) 20 MG tablet    Sig: Take 1 tablet (20 mg total) by mouth daily.    Dispense:  30 tablet    Refill:  2  May increase  current dose of Paxil to 2 tablets daily and then start 20 mg tablet.  Follow-up: Return in about 2 months (around 06/26/2021), or if symptoms worsen or fail to improve.    Libby Maw, MD  Virtual Visit via Video Note  I connected with [name] on 04/28/21 at  8:30 AM EST by a video enabled telemedicine application and verified that I am speaking with the correct person using two identifiers.  Location: Patient: home alone in a room.  Provider: work   I discussed the limitations of evaluation and management by telemedicine and the availability of in person appointments. The patient expressed understanding and agreed to proceed.  History of Present Illness:    Observations/Objective:   Assessment and Plan:   Follow Up Instructions:    I discussed the assessment and treatment plan with the patient. The patient was provided an opportunity to ask questions and all were answered. The patient agreed with the plan and demonstrated an understanding of the instructions.   The patient was advised to call back or seek an in-person evaluation if the symptoms worsen or if the condition fails to improve as anticipated.  I provided 25 minutes of non-face-to-face time during this encounter.   Libby Maw, MD

## 2021-04-30 ENCOUNTER — Telehealth: Payer: Self-pay | Admitting: Family Medicine

## 2021-04-30 NOTE — Telephone Encounter (Signed)
From  Alferd Patee To  Cristy Friedlander Sent and Delivered  04/30/2021  4:33 PM     If you could please call Medicaid [employer] and update your PCP,  Dr Nadene Rubins  They should be able to pull up for [place] if they cant find him and would just need to type in the NPI: [id number]   I sent mychart message to patient and lvm so we can get her PCP updated

## 2021-05-01 ENCOUNTER — Telehealth: Payer: Medicaid Other | Admitting: Physician Assistant

## 2021-05-01 DIAGNOSIS — R6889 Other general symptoms and signs: Secondary | ICD-10-CM | POA: Diagnosis not present

## 2021-05-01 MED ORDER — OSELTAMIVIR PHOSPHATE 75 MG PO CAPS: 75.0000 mg | ORAL_CAPSULE | Freq: Two times a day (BID) | ORAL | 0 refills | Status: DC

## 2021-05-01 NOTE — Progress Notes (Signed)

## 2021-05-01 NOTE — Progress Notes (Signed)
I have spent 5 minutes in review of e-visit questionnaire, review and updating patient chart, medical decision making and response to patient.   Shamanda Len Cody Iridessa Harrow, PA-C    

## 2021-05-11 NOTE — Telephone Encounter (Signed)
2ND NO SHOW 04/25/21, CANNOT CHARGE FEE (PT HAS MEDICAID), LETTER SENT

## 2021-07-05 ENCOUNTER — Telehealth: Payer: Medicaid Other | Admitting: Nurse Practitioner

## 2021-07-05 DIAGNOSIS — Z91010 Allergy to peanuts: Secondary | ICD-10-CM | POA: Diagnosis not present

## 2021-07-05 MED ORDER — PREDNISONE 20 MG PO TABS: 40.0000 mg | ORAL_TABLET | Freq: Every day | ORAL | 0 refills | Status: AC

## 2021-07-05 NOTE — Progress Notes (Signed)
E visit for Allergic Rhinitis ?We are sorry that you are not feeling well.  Here is how we plan to help! ? ?Based on what you have shared with me it looks like you have Allergic to peanuts. ? ?I suggest you take benadryl- I will send in sterids 20mg  2 at same time daily for 5 days.  ?Keep epi pen close by if needed.- dont hesitate to use it if you need to. ?To ED/call EMS  if throat starts closing up ? ?HOME CARE: ? ?You can use an over-the-counter saline nasal spray as needed ?Avoid areas where there is heavy dust, mites, or molds ?Stay indoors on windy days during the pollen season ?Keep windows closed in home, at least in bedroom; use air conditioner. ?Use high-efficiency house air filter ?Keep windows closed in car, turn [facility] on re-circulate ?Avoid playing out with dog during pollen season ? ?GET HELP RIGHT AWAY IF: ? ?If your symptoms do not improve within 10 days ?You become short of breath ?You develop yellow or green discharge from your nose for over 3 days ?You have coughing fits ? ?MAKE SURE YOU: ? ?Understand these instructions ?Will watch your condition ?Will get help right away if you are not doing well or get worse ? ?Thank you for choosing an e-visit. ?Your e-visit answers were reviewed by a board certified advanced clinical practitioner to complete your personal care plan. Depending upon the condition, your plan could have included both over the counter or prescription medications. ?Please review your pharmacy choice. Be sure that the pharmacy you have chosen is open so that you can pick up your prescription now.  If there is a problem you may message your provider in MyChart to have the prescription routed to another pharmacy. ?Your safety is important to [facility]. If you have drug allergies check your prescription carefully.  ?For the next 24 hours, you can use MyChart to ask questions about today?s visit, request a non-urgent call back, or ask for a work or school excuse from your e-visit provider. ?You  will get an email in the next two days asking about your experience. I hope that your e-visit has been valuable and will speed your recovery. ? ? ?5-10 minutes spent reviewing and documenting in chart. ? ? ? ? ? ?

## 2021-07-22 ENCOUNTER — Telehealth: Payer: Medicaid Other | Admitting: Physician Assistant

## 2021-07-22 DIAGNOSIS — H1031 Unspecified acute conjunctivitis, right eye: Secondary | ICD-10-CM

## 2021-07-22 MED ORDER — POLYMYXIN B-TRIMETHOPRIM 10000-0.1 UNIT/ML-% OP SOLN: OPHTHALMIC | 0 refills | Status: DC

## 2021-07-22 NOTE — Progress Notes (Signed)
I have spent 5 minutes in review of e-visit questionnaire, review and updating patient chart, medical decision making and response to patient.   Othella Slappey Cody Carlyle Achenbach, PA-C    

## 2021-07-22 NOTE — Progress Notes (Signed)

## 2021-07-31 ENCOUNTER — Telehealth: Payer: Medicaid Other | Admitting: Family Medicine

## 2021-08-04 ENCOUNTER — Telehealth (INDEPENDENT_AMBULATORY_CARE_PROVIDER_SITE_OTHER): Payer: Medicaid Other | Admitting: Family Medicine

## 2021-08-04 ENCOUNTER — Encounter: Payer: Self-pay | Admitting: Family Medicine

## 2021-08-04 VITALS — Ht 60.0 in | Wt 180.0 lb

## 2021-08-04 DIAGNOSIS — F5105 Insomnia due to other mental disorder: Secondary | ICD-10-CM | POA: Diagnosis not present

## 2021-08-04 DIAGNOSIS — Z91010 Allergy to peanuts: Secondary | ICD-10-CM | POA: Diagnosis not present

## 2021-08-04 DIAGNOSIS — G43719 Chronic migraine without aura, intractable, without status migrainosus: Secondary | ICD-10-CM

## 2021-08-04 DIAGNOSIS — F418 Other specified anxiety disorders: Secondary | ICD-10-CM | POA: Diagnosis not present

## 2021-08-04 MED ORDER — EPINEPHRINE 0.3 MG/0.3ML IJ SOAJ: 0.3000 mg | INTRAMUSCULAR | 0 refills | Status: DC | PRN

## 2021-08-04 MED ORDER — TOPIRAMATE 25 MG PO TABS: ORAL_TABLET | ORAL | 2 refills | Status: DC

## 2021-08-04 MED ORDER — SUMATRIPTAN SUCCINATE 50 MG PO TABS: ORAL_TABLET | ORAL | 1 refills | Status: DC

## 2021-08-04 MED ORDER — PAROXETINE HCL 20 MG PO TABS: 20.0000 mg | ORAL_TABLET | Freq: Every day | ORAL | 2 refills | Status: DC

## 2021-08-04 NOTE — Progress Notes (Signed)
? ?Established Patient Office Visit ? ?Subjective   ?Patient ID: Sharon Townsend, female    DOB: 1990-05-17  Age: 31 y.o. MRN: [id number] ? ?Chief Complaint  ?Patient presents with  ? Follow-up  ?  Follow up/refill on medication.   ? ? ?HPI follow-up today of anxiety with insomnia, chronic migraine and peanut allergy. ? ?Episode after eating [place] food that have to have peanuts in it.  She did use both of her EpiPen's and welled up in the emergency room.  Needs a refill.  Topamax is preventing migraines to a great extent.  There is drowsiness with it.  She deals with this by taking 1 in the morning and 2 in the evening.  She rarely has migraines with the Topamax.  Paxil works best in the evening for her.  She continues working full-time as a Lawyer.  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 ? ? ? ?Review of Systems  ?Constitutional:  Negative for chills, diaphoresis, malaise/fatigue and weight loss.  ?HENT: Negative.    ?Eyes: Negative.  Negative for blurred vision and double vision.  ?Cardiovascular:  Negative for chest pain.  ?Gastrointestinal:  Negative for abdominal pain.  ?Genitourinary: Negative.   ?Musculoskeletal:  Negative for falls and myalgias.  ?Neurological:  Negative for speech change, loss of consciousness and weakness.  ?Psychiatric/Behavioral: Negative.    ? ?  08/04/2021  ? 10:26 AM 04/28/2021  ?  8:33 AM 04/28/2021  ?  8:32 AM  ?Depression screen PHQ 2/9  ?Decreased Interest 0 0 0  ?Down, Depressed, Hopeless 0 0 0  ?PHQ - 2 Score 0 0 0  ?Altered sleeping 0 1   ?Tired, decreased energy 0 1   ?Change in appetite 0 0   ?Feeling bad or failure about yourself  0 1   ?Trouble concentrating 0 0   ?Moving slowly or fidgety/restless 0 0   ?Suicidal thoughts 0 0   ?PHQ-9 Score 0 3   ?Difficult doing work/chores Not difficult at all Not difficult at all   ? ? ? ?  ?Objective:  ?  ? ?Ht 5' (1.524 m)   Wt 180 lb (81.6 kg) Comment: per patient  BMI 35.15 kg/m?  ? ? ?Physical  Exam ?Constitutional:   ?   General: She is not in acute distress. ?   Appearance: Normal appearance. She is not ill-appearing, toxic-appearing or diaphoretic.  ?HENT:  ?   Head: Normocephalic and atraumatic.  ?   Right Ear: External ear normal.  ?   Left Ear: External ear normal.  ?Eyes:  ?   General: No scleral icterus.    ?   Right eye: No discharge.     ?   Left eye: No discharge.  ?   Extraocular Movements: Extraocular movements intact.  ?   Conjunctiva/sclera: Conjunctivae normal.  ?Pulmonary:  ?   Effort: Pulmonary effort is normal. No respiratory distress.  ?Skin: ?   General: Skin is warm and dry.  ?Neurological:  ?   Mental Status: She is alert and oriented to person, place, and time.  ?Psychiatric:     ?   Mood and Affect: Mood normal.     ?   Behavior: Behavior normal.  ? ? ? ?No results found for any visits on 08/04/21. ? ? ? ?The ASCVD Risk score (Arnett DK, et al., 2019) failed to calculate for the following reasons: ?  The 2019 ASCVD risk score is only valid for ages 33 to  79 ? ?  ?Assessment & Plan:  ? ?Problem List Items Addressed This Visit   ? ?  ? Other  ? Insomnia secondary to depression with anxiety  ? Relevant Medications  ? PARoxetine (PAXIL) 20 MG tablet  ? ?Other Visit Diagnoses   ? ? Peanut allergy    -  Primary  ? Relevant Medications  ? EPINEPHrine 0.3 mg/0.3 mL IJ SOAJ injection  ? Intractable chronic migraine without aura and without status migrainosus      ? Relevant Medications  ? EPINEPHrine 0.3 mg/0.3 mL IJ SOAJ injection  ? PARoxetine (PAXIL) 20 MG tablet  ? SUMAtriptan (IMITREX) 50 MG tablet  ? topiramate (TOPAMAX) 25 MG tablet  ? ?  ? ? ?Return in about 6 months (around 02/04/2022).  ?Continue above medications as directed. ? ?Mliss Sax, MD ? ?Virtual Visit via Video Note ? ?I connected with Sharon Townsend on 08/04/21 at 10:20 AM EDT by a video enabled telemedicine application and verified that I am speaking with the correct person using two  identifiers. ? ?Location: ?Patient: at home with her husband.  ?Provider: work ?  ?I discussed the limitations of evaluation and management by telemedicine and the availability of in person appointments. The patient expressed understanding and agreed to proceed. ? ?History of Present Illness: ? ?  ?Observations/Objective: ? ? ?Assessment and Plan: ? ? ?Follow Up Instructions: ? ?  ?I discussed the assessment and treatment plan with the patient. The patient was provided an opportunity to ask questions and all were answered. The patient agreed with the plan and demonstrated an understanding of the instructions. ?  ?The patient was advised to call back or seek an in-person evaluation if the symptoms worsen or if the condition fails to improve as anticipated. ? ?I provided 25 minutes of non-face-to-face time during this encounter. ? ? ?Mliss Sax, MD  ? ?

## 2021-09-14 ENCOUNTER — Ambulatory Visit
Admission: RE | Admit: 2021-09-14 | Discharge: 2021-09-14 | Disposition: A | Discharge status: Home / Self Care | Payer: Medicaid Other | Source: Ambulatory Visit | Attending: Internal Medicine | Admitting: Internal Medicine

## 2021-09-14 VITALS — BP 115/80 | HR 80 | Temp 98.8°F | Resp 18

## 2021-09-14 DIAGNOSIS — G43019 Migraine without aura, intractable, without status migrainosus: Secondary | ICD-10-CM | POA: Diagnosis not present

## 2021-09-14 MED ORDER — DEXAMETHASONE SODIUM PHOSPHATE 10 MG/ML IJ SOLN
10.0000 mg | Freq: Once | INTRAMUSCULAR | Status: AC
  Administered 2021-09-14: 10 mg via INTRAMUSCULAR

## 2021-09-14 MED ORDER — ONDANSETRON 4 MG PO TBDP
4.0000 mg | ORAL_TABLET | Freq: Once | ORAL | Status: AC
  Administered 2021-09-14: 4 mg via ORAL

## 2021-09-14 NOTE — ED Provider Notes (Signed)
[name]    CSN: [id number] Arrival date & time: 09/14/21  1358      History   Chief Complaint Chief Complaint  Patient presents with   Migraine    Entered by patient    HPI Sharon Townsend is a 31 y.o. female.   Patient presents with migraine that has been persistent since about 7:30 last night.  Patient does have history of migraine and takes Topamax as well as sumatriptan for them.  She has taken these medications as well as ibuprofen with no improvement.  She does have associated nausea without vomiting but denies dizziness or blurred vision.  Denies any recent head injuries as well. Denies that this is the worst headache of her life. States this migraine is typical for her migraines.    Migraine    Past Medical History:  Diagnosis Date   Bipolar disorder ([facility])    Chronic constipation    Gall bladder stones    Headache    PONV (postoperative nausea and vomiting)    Renal disorder    two tubes in left kidney going to bladder    Patient Active Problem List   Diagnosis Date Noted   Anaphylaxis 03/26/2021   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   Menorrhagia with regular cycle 12/18/2019   Dyspareunia in female 12/18/2019   Family history of breast cancer 12/18/2019   Family history of ovarian cancer 12/18/2019    Past Surgical History:  Procedure Laterality Date   CESAREAN SECTION N/A 06/14/2016   Procedure: CESAREAN SECTION;  Surgeon: Philip Aspen, DO;  [facility];  Service: Obstetrics;  Laterality: N/A;   child birth  2016   CHOLECYSTECTOMY     DILITATION & CURRETTAGE/HYSTROSCOPY WITH NOVASURE ABLATION N/A 02/21/2020   Procedure: DILATATION & CURETTAGE, WITH NOVASURE ABLATION;  Surgeon: Adam Phenix, MD;  Location: [facility];  Service: Gynecology;  Laterality: N/A;   MANDIBLE SURGERY     renal stents     TONSILLECTOMY     TUBAL LIGATION  06/14/2016    OB History     Gravida   4   Para  3   Term  1   Preterm  2   AB  1   Living  2      SAB  1   IAB  0   Ectopic  0   Multiple  0   Live Births  2            Home Medications    Prior to Admission medications   Medication Sig Start Date End Date Taking? Authorizing Provider  EPINEPHrine 0.3 mg/0.3 mL IJ SOAJ injection Inject 0.3 mg into the muscle as needed for anaphylaxis. 08/04/21   Mliss Sax, MD  ibuprofen (ADVIL) 200 MG tablet Take 600 mg by mouth every 6 (six) hours as needed for headache or mild pain. Patient not taking: Reported on 08/04/2021    [provider]  Multiple Vitamins-Minerals (ONE-A-DAY WOMENS PO) Take 1 tablet by mouth at bedtime.    [provider]  Omega-3 Fatty Acids (FISH OIL) 1000 MG CPDR Take 1,000 mg by mouth at bedtime.    [provider]  PARoxetine (PAXIL) 20 MG tablet Take 1 tablet (20 mg total) by mouth daily. 08/04/21   Mliss Sax, MD  SUMAtriptan (IMITREX) 50 MG tablet 1-2 tabs po PRN, May repeat x 1 dose in 2 hours if headache persists or recurs. 08/04/21  Mliss Sax, MD  topiramate (TOPAMAX) 25 MG tablet Take one each morning and 2 in the evening. 08/04/21   Mliss Sax, MD    Family History Family History  Problem Relation Age of Onset   Diabetes Mother    Breast cancer Mother 59   Diabetes Father    Hypertension Father    Breast cancer Maternal Grandmother    Ovarian cancer Paternal Grandmother     Social History Social History   Tobacco Use   Smoking status: Never   Smokeless tobacco: Never  Vaping Use   Vaping Use: Never used  Substance Use Topics   Alcohol use: No    Comment: before preg   Drug use: No     Allergies   Peanut oil, Latex, and Penicillins   Review of Systems Review of Systems Per HPI  Physical Exam Triage Vital Signs ED Triage Vitals  Enc Vitals Group     BP 09/14/21 1415 115/80     Pulse Rate 09/14/21 1415 80     Resp 09/14/21 1415 18      Temp 09/14/21 1415 98.8 F (37.1 C)     Temp Source 09/14/21 1415 Oral     SpO2 09/14/21 1415 98 %     Weight --      Height --      Head Circumference --      Peak Flow --      Pain Score 09/14/21 1416 0     Pain Loc --      Pain Edu? --      Excl. in GC? --    No data found.  Updated Vital Signs BP 115/80 (BP Location: Left Arm)   Pulse 80   Temp 98.8 F (37.1 C) (Oral)   Resp 18   SpO2 98%   Visual Acuity Right Eye Distance:   Left Eye Distance:   Bilateral Distance:    Right Eye Near:   Left Eye Near:    Bilateral Near:     Physical Exam Constitutional:      General: She is not in acute distress.    Appearance: Normal appearance. She is not toxic-appearing or diaphoretic.  HENT:     Head: Normocephalic and atraumatic.  Eyes:     Extraocular Movements: Extraocular movements intact.     Conjunctiva/sclera: Conjunctivae normal.     Pupils: Pupils are equal, round, and reactive to light.  Cardiovascular:     Rate and Rhythm: Normal rate and regular rhythm.     Pulses: Normal pulses.     Heart sounds: Normal heart sounds.  Pulmonary:     Effort: Pulmonary effort is normal. No respiratory distress.     Breath sounds: Normal breath sounds.  Neurological:     General: No focal deficit present.     Mental Status: She is alert and oriented to person, place, and time. Mental status is at baseline.     Cranial Nerves: Cranial nerves 2-12 are intact.     Sensory: Sensation is intact.     Motor: Motor function is intact.     Coordination: Coordination is intact.     Gait: Gait is intact.  Psychiatric:        Mood and Affect: Mood normal.        Behavior: Behavior normal.        Thought Content: Thought content normal.        Judgment: Judgment normal.      UC Treatments /  Results  Labs (all labs ordered are listed, but only abnormal results are displayed) Labs Reviewed - No data to display  EKG   Radiology No results found.  Procedures Procedures  (including critical care time)  Medications Ordered in UC Medications  ondansetron (ZOFRAN-ODT) disintegrating tablet 4 mg (has no administration in time range)  dexamethasone (DECADRON) injection 10 mg (has no administration in time range)    Initial Impression / Assessment and Plan / UC Course  I have reviewed the triage vital signs and the nursing notes.  Pertinent labs & imaging results that were available during my care of the patient were reviewed by me and considered in my medical decision making (see chart for details).     Patient has migraine and has history of migraines and states this is typical for her migraines.  Patient denies that this is the worst headache of her life so do not think that emergent evaluation or imaging of the head is necessary at this time.  Will avoid IM Toradol given that patient took ibuprofen a few hours prior to arrival to urgent care.  Administered other components of migraine cocktail including IM Decadron as well as Zofran.  Patient was advised to follow-up at the ER if headache does not improve or if it worsens in the next 24 to 48 hours.  Patient verbalized understanding and was agreeable with plan. Final Clinical Impressions(s) / UC Diagnoses   Final diagnoses:  Intractable migraine without aura and without status migrainosus     Discharge Instructions      You were given components of migraine cocktail in urgent care today.  Please follow-up if symptoms persist or worsen.    ED Prescriptions   None    PDMP not reviewed this encounter.   Gustavus Bryant, [place] 09/14/21 1440

## 2021-09-14 NOTE — Discharge Instructions (Signed)
You were given components of migraine cocktail in urgent care today.  Please follow-up if symptoms persist or worsen.

## 2021-09-14 NOTE — ED Triage Notes (Signed)
Pt c/o migraine onset last night states has 2 rx she takes for migraines but is not helping this time.

## 2021-10-07 ENCOUNTER — Encounter: Payer: Medicaid Other | Admitting: Radiology

## 2021-10-09 ENCOUNTER — Telehealth: Payer: Medicaid Other | Admitting: Physician Assistant

## 2021-10-09 DIAGNOSIS — J069 Acute upper respiratory infection, unspecified: Secondary | ICD-10-CM

## 2021-10-09 MED ORDER — BENZONATATE 100 MG PO CAPS: 100.0000 mg | ORAL_CAPSULE | Freq: Three times a day (TID) | ORAL | 0 refills | Status: DC | PRN

## 2021-10-09 MED ORDER — FLUTICASONE PROPIONATE 50 MCG/ACT NA SUSP: 2.0000 | Freq: Every day | NASAL | 0 refills | Status: DC

## 2021-10-09 NOTE — Progress Notes (Signed)
E-Visit for Corona Virus Screening  Your current symptoms could be consistent with the coronavirus.  Many health care providers can now test patients at their office but not all are.  [facility] has multiple testing sites. For information on our COVID testing locations and hours go to [email or web address]  We are enrolling you in our MyChart Home Monitoring for COVID19 . Daily you will receive a questionnaire within the MyChart website. Our COVID 19 response team will be monitoring your responses daily.  Testing Information: The COVID-19 Community Testing sites are testing BY APPOINTMENT ONLY.  You can schedule online at [email or web address]  If you do not have access to a smart phone or computer you may call [phone] for an appointment.   Additional testing sites in the Community:  For [facility] Testing sites in [place]  https://www.cvs.com/minuteclinic/covid-19-testing  For Pop-up testing sites in   https://covid19.ncdhhs.gov/about-covid-19/testing/find-my-testing-place/pop-testing-sites  For [facility] https://[place].gov[place]/human-services/health-department/coronavirus-covid-19-info/covid-19-testing  For [place] testing in [place] and [place] https://www.[place].gov/[place]/human-services/health-department/coronavirus-covid-19-info/covid-19-testing  For [facility] testing in [place]   https://lhi.care/covidtesting  For  more information about community testing call [phone]   Please quarantine yourself while awaiting your test results. Please stay home for a minimum of 10 days from the first day of illness with improving symptoms and you have had 24 hours of no fever (without the use of Tylenol (Acetaminophen) Motrin (Ibuprofen) or any fever reducing medication).  Also - Do not get tested prior to returning to work because once you have had a positive test the test can stay positive for  more than a month in some cases.   You should wear a mask or cloth face covering over your nose and mouth if you must be around other people or animals, including pets (even at home). Try to stay at least 6 feet away from other people. This will protect the people around you.  Please continue good preventive care measures, including:  frequent hand-washing, avoid touching your face, cover coughs/sneezes, stay out of crowds and keep a 6 foot distance from others.  COVID-19 is a respiratory illness with symptoms that are similar to the flu. Symptoms are typically mild to moderate, but there have been cases of severe illness and death due to the virus.   The following symptoms may appear 2-14 days after exposure: Fever Cough Shortness of breath or difficulty breathing Chills Repeated shaking with chills Muscle pain Headache Sore throat New loss of taste or smell Fatigue Congestion or runny nose Nausea or vomiting Diarrhea  Go to the nearest hospital ED for assessment if fever/cough/breathlessness are severe or illness seems like a threat to life.  It is vitally important that if you feel that you have an infection such as this virus or any other virus that you stay home and away from places where you may spread it to others.  You should avoid contact with people age 65 and older.   You can use medication such as prescription cough medication called Tessalon Perles 100 mg. You may take 1-2 capsules every 8 hours as needed for cough and prescription for Fluticasone nasal spray 2 sprays in each nostril one time per day  You may also take acetaminophen (Tylenol) as needed for fever.  Reduce your risk of any infection by using the same precautions used for avoiding the common cold or flu:  Wash your hands often with soap and warm water for at least 20 seconds.  If soap and water are not readily available, use an alcohol-based   hand sanitizer with at least 60% alcohol.  If coughing or sneezing,  cover your mouth and nose by coughing or sneezing into the elbow areas of your shirt or coat, into a tissue or into your sleeve (not your hands). Avoid shaking hands with others and consider head nods or verbal greetings only. Avoid touching your eyes, nose, or mouth with unwashed hands.  Avoid close contact with people who are sick. Avoid places or events with large numbers of people in one location, like concerts or sporting events. Carefully consider travel plans you have or are making. If you are planning any travel outside or inside the [place], visit the CDC's Travelers' Health webpage for the latest health notices. If you have some symptoms but not all symptoms, continue to monitor at home and seek medical attention if your symptoms worsen. If you are having a medical emergency, call 911.  HOME CARE Only take medications as instructed by your medical team. Drink plenty of fluids and get plenty of rest. A steam or ultrasonic humidifier can help if you have congestion.   GET HELP RIGHT AWAY IF YOU HAVE EMERGENCY WARNING SIGNS** FOR COVID-19. If you or someone is showing any of these signs seek emergency medical care immediately. Call 911 or proceed to your closest emergency facility if: You develop worsening high fever. Trouble breathing Bluish lips or face Persistent pain or pressure in the chest New confusion Inability to wake or stay awake You cough up blood. Your symptoms become more severe  **This list is not all possible symptoms. Contact your medical provider for any symptoms that are sever or concerning to you.  MAKE SURE YOU  Understand these instructions. Will watch your condition. Will get help right away if you are not doing well or get worse.  Your e-visit answers were reviewed by a board certified advanced clinical practitioner to complete your personal care plan.  Depending on the condition, your plan could have included both over the counter or prescription  medications.  If there is a problem please reply once you have received a response from your provider.  Your safety is important to us.  If you have drug allergies check your prescription carefully.    You can use MyChart to ask questions about today's visit, request a non-urgent call back, or ask for a work or school excuse for 24 hours related to this e-Visit. If it has been greater than 24 hours you will need to follow up with your provider, or enter a new e-Visit to address those concerns. You will get an e-mail in the next two days asking about your experience.  I hope that your e-visit has been valuable and will speed your recovery. Thank you for using e-visits.  I provided 5 minutes of non face-to-face time during this encounter for chart review and documentation.   

## 2021-11-07 ENCOUNTER — Telehealth: Payer: Self-pay | Admitting: Family Medicine

## 2021-11-07 ENCOUNTER — Ambulatory Visit: Payer: Medicaid Other | Admitting: Family Medicine

## 2021-11-07 NOTE — Telephone Encounter (Signed)
8.11.23 no show letter sent 

## 2021-11-11 NOTE — Telephone Encounter (Signed)
3rd no show, (no fee for Medicaid), dismissal letter sent  NO SHOWS 01/02/21, 04/25/21, 11/07/21

## 2021-11-26 ENCOUNTER — Telehealth: Payer: Self-pay | Admitting: Family Medicine

## 2021-11-26 ENCOUNTER — Ambulatory Visit: Payer: Medicaid Other | Admitting: Family Medicine

## 2021-11-26 NOTE — Telephone Encounter (Signed)
Did not sent out no show letter as pt has been dismissed

## 2021-11-26 NOTE — Telephone Encounter (Signed)
8.30.23 no show letter sent 

## 2022-01-02 ENCOUNTER — Telehealth: Payer: Self-pay | Admitting: Family Medicine

## 2022-01-02 NOTE — Telephone Encounter (Signed)
Caller Name: Breea Loncar Call back phone #: [phone]  Reason for Call: Pt has been dismissed and is aware. She did ask for her  vaccinations records to be sent to her

## 2022-01-06 NOTE — Telephone Encounter (Signed)
Immunization records printed and sent out to be mailed to patient called to inform. No answer LM

## 2022-01-09 ENCOUNTER — Ambulatory Visit: Payer: Medicaid Other | Admitting: Nurse Practitioner

## 2022-02-17 ENCOUNTER — Ambulatory Visit: Payer: Medicaid Other | Admitting: Family Medicine

## 2022-02-18 ENCOUNTER — Telehealth: Payer: Self-pay | Admitting: Family

## 2022-03-04 ENCOUNTER — Telehealth: Payer: Medicaid Other | Admitting: Physician Assistant

## 2022-03-04 DIAGNOSIS — J019 Acute sinusitis, unspecified: Secondary | ICD-10-CM | POA: Diagnosis not present

## 2022-03-04 DIAGNOSIS — B9689 Other specified bacterial agents as the cause of diseases classified elsewhere: Secondary | ICD-10-CM

## 2022-03-04 DIAGNOSIS — U071 COVID-19: Secondary | ICD-10-CM

## 2022-03-04 MED ORDER — DOXYCYCLINE HYCLATE 100 MG PO TABS: 100.0000 mg | ORAL_TABLET | Freq: Two times a day (BID) | ORAL | 0 refills | Status: DC

## 2022-03-04 NOTE — Progress Notes (Signed)
I have spent 5 minutes in review of e-visit questionnaire, review and updating patient chart, medical decision making and response to patient.   Mavi Un Cody Daiana Vitiello, PA-C    

## 2022-03-04 NOTE — Progress Notes (Signed)
E-Visit for Sinus Problems  We are sorry that you are not feeling well.  Here is how we plan to help!  Based on what you have shared with me it looks like you have sinusitis secondary to recent COVID infection. Since symptoms started 5 days ago, as long as fever-free you can end COVID quarantine but you should mask an additional 5 days.   Sinusitis is inflammation and infection in the sinus cavities of the head.  Based on your presentation I believe you most likely have Acute Bacterial Sinusitis.  This is an infection caused by bacteria and is treated with antibiotics. I have prescribed Doxycycline 100mg  by mouth twice a day for 10 days. You may use an oral decongestant such as Mucinex D or if you have glaucoma or high blood pressure use plain Mucinex. Saline nasal spray help and can safely be used as often as needed for congestion.  If you develop worsening sinus pain, fever or notice severe headache and vision changes, or if symptoms are not better after completion of antibiotic, please schedule an appointment with a health care provider.    Sinus infections are not as easily transmitted as other respiratory infection, however we still recommend that you avoid close contact with loved ones, especially the very young and elderly.  Remember to wash your hands thoroughly throughout the day as this is the number one way to prevent the spread of infection!  Home Care: Only take medications as instructed by your medical team. Complete the entire course of an antibiotic. Do not take these medications with alcohol. A steam or ultrasonic humidifier can help congestion.  You can place a towel over your head and breathe in the steam from hot water coming from a faucet. Avoid close contacts especially the very young and the elderly. Cover your mouth when you cough or sneeze. Always remember to wash your hands.  Get Help Right Away If: You develop worsening fever or sinus pain. You develop a severe head  ache or visual changes. Your symptoms persist after you have completed your treatment plan.  Make sure you Understand these instructions. Will watch your condition. Will get help right away if you are not doing well or get worse.  Thank you for choosing an e-visit.  Your e-visit answers were reviewed by a board certified advanced clinical practitioner to complete your personal care plan. Depending upon the condition, your plan could have included both over the counter or prescription medications.  Please review your pharmacy choice. Make sure the pharmacy is open so you can pick up prescription now. If there is a problem, you may contact your provider through and have the prescription routed to another pharmacy.  Your safety is important to [employer]. If you have drug allergies check your prescription carefully.   For the next 24 hours you can use MyChart to ask questions about today's visit, request a non-urgent call back, or ask for a work or school excuse. You will get an email in the next two days asking about your experience. I hope that your e-visit has been valuable and will speed your recovery.

## 2022-03-09 ENCOUNTER — Encounter: Payer: Medicaid Other | Admitting: Family

## 2022-03-09 NOTE — Progress Notes (Signed)
  This encounter was created in error - please disregard. No show 

## 2022-03-16 ENCOUNTER — Ambulatory Visit: Payer: Medicaid Other | Admitting: Family Medicine

## 2022-04-02 ENCOUNTER — Telehealth: Payer: Medicaid Other | Admitting: Physician Assistant

## 2022-04-02 ENCOUNTER — Telehealth: Payer: Medicaid Other | Admitting: Urgent Care

## 2022-04-02 DIAGNOSIS — K115 Sialolithiasis: Secondary | ICD-10-CM

## 2022-04-02 DIAGNOSIS — K1120 Sialoadenitis, unspecified: Secondary | ICD-10-CM

## 2022-04-02 DIAGNOSIS — Z91199 Patient's noncompliance with other medical treatment and regimen due to unspecified reason: Secondary | ICD-10-CM

## 2022-04-02 NOTE — Progress Notes (Signed)
Virtual Visit Consent   [name], you are scheduled for a virtual visit with a [facility] provider today. Just as with appointments in the office, your consent must be obtained to participate. Your consent will be active for this visit and any virtual visit you may have with one of our providers in the next 365 days. If you have a MyChart account, a copy of this consent can be sent to you electronically.  As this is a virtual visit, video technology does not allow for your provider to perform a traditional examination. This may limit your provider's ability to fully assess your condition. If your provider identifies any concerns that need to be evaluated in person or the need to arrange testing (such as labs, EKG, etc.), we will make arrangements to do so. Although advances in technology are sophisticated, we cannot ensure that it will always work on either your end or our end. If the connection with a video visit is poor, the visit may have to be switched to a telephone visit. With either a video or telephone visit, we are not always able to ensure that we have a secure connection.  By engaging in this virtual visit, you consent to the provision of healthcare and authorize for your insurance to be billed (if applicable) for the services provided during this visit. Depending on your insurance coverage, you may receive a charge related to this service.  I need to obtain your verbal consent now. Are you willing to proceed with your visit today? Sharon Townsend has provided verbal consent on 04/02/2022 for a virtual visit (video or telephone). Chaney Malling, PA  Date: 04/02/2022 5:16 PM  Virtual Visit via Video Note   I, Sharon Townsend, connected with  Sharon Townsend  ([id number], Oct 28, 1990) on 04/02/22 at  5:00 PM EST by a video-enabled telemedicine application and verified that I am speaking with the correct person using two identifiers.  Location: Patient: Virtual Visit Location  Patient: Other: office Provider: Virtual Visit Location Provider: Home Office   I discussed the limitations of evaluation and management by telemedicine and the availability of in person appointments. The patient expressed understanding and agreed to proceed.    History of Present Illness: Sharon Townsend is a 33 y.o. who identifies as a female who was assigned female at birth, and is being seen today for L sided neck pain.  HPI: 32yo female presents due to a 3 day hx of " L sided neck pain." She points to her L submandibular glad. States it feels hard and painful, the size of a small grape. Admits it gets bigger when eating, smaller when not eating. Denies any dental issues, infections, or gum disease. Denies sore throat, sinus pain, ear pain. No cough or additional URI sx. Denies lymphadenopathy at any other location. No fever.   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   Menorrhagia with regular cycle 12/18/2019   Dyspareunia in female 12/18/2019   Family history of breast cancer 12/18/2019   Family history of ovarian cancer 12/18/2019    Allergies:  Allergies  Allergen Reactions   Peanut Oil Anaphylaxis   Latex Swelling and Other (See Comments)   Penicillins Other (See Comments)    Reaction:  Pt states that it "shuts down her bowels and cannot urinate" Has patient had a PCN reaction causing immediate rash, facial/tongue/throat swelling, SOB or  lightheadedness with hypotension: No Has patient had a PCN reaction causing severe rash involving mucus membranes or skin necrosis: No Has patient had a PCN reaction that required hospitalization No Has patient had a PCN reaction occurring within the last 10 years: No If all of the above answers are "NO", then may proceed with Cephalosp Reaction:  Pt states that it "shuts down her bowel movements". Reaction:  Pt states that it "shuts  down her bowels and cannot urinate"    Medications:  Current Outpatient Medications:    benzonatate (TESSALON) 100 MG capsule, Take 1 capsule (100 mg total) by mouth 3 (three) times daily as needed., Disp: 30 capsule, Rfl: 0   doxycycline (VIBRA-TABS) 100 MG tablet, Take 1 tablet (100 mg total) by mouth 2 (two) times daily., Disp: 20 tablet, Rfl: 0   EPINEPHrine 0.3 mg/0.3 mL IJ SOAJ injection, Inject 0.3 mg into the muscle as needed for anaphylaxis., Disp: 1 each, Rfl: 0   fluticasone (FLONASE) 50 MCG/ACT nasal spray, Place 2 sprays into both nostrils daily., Disp: 16 g, Rfl: 0   ibuprofen (ADVIL) 200 MG tablet, Take 600 mg by mouth every 6 (six) hours as needed for headache or mild pain. (Patient not taking: Reported on 08/04/2021), Disp: , Rfl:    Multiple Vitamins-Minerals (ONE-A-DAY WOMENS PO), Take 1 tablet by mouth at bedtime., Disp: , Rfl:    Omega-3 Fatty Acids (FISH OIL) 1000 MG CPDR, Take 1,000 mg by mouth at bedtime., Disp: , Rfl:    PARoxetine (PAXIL) 20 MG tablet, Take 1 tablet (20 mg total) by mouth daily., Disp: 90 tablet, Rfl: 2   SUMAtriptan (IMITREX) 50 MG tablet, 1-2 tabs po PRN, May repeat x 1 dose in 2 hours if headache persists or recurs., Disp: 20 tablet, Rfl: 1   topiramate (TOPAMAX) 25 MG tablet, Take one each morning and 2 in the evening., Disp: 180 tablet, Rfl: 2  Observations/Objective: Patient is well-developed, well-nourished in no acute distress.  Resting comfortably, appears to be in a medical clinic exam room.  Head is normocephalic, atraumatic.  No labored breathing.  Speech is clear and coherent with logical content.  Patient is alert and oriented at baseline.  Pt reporting tenderness to submandibular region on L. No visible abnormalities via camera No erythema of skin NO TTP of ramus of jaw or preauricular region. Jaw with FROM  Assessment and Plan: 1. Sialolith  2. Sialoadenitis of submandibular gland  Pt appears to likely have a sialolith /  sialoadenitis of the gland. No worrisome sx concerning for bacterial infection at this time. Recommended pt eat tart hard candies or suck a lemon. Also recommended OTC ibuprofen and a heated rag to help with pain.  The patient hung up the phone abruptly after 7 minutes as it appears she was in a doctors clinic and the provider possibly entered room, thus unable to ask for any additional questions or review any additional management options.  Follow Up Instructions: I discussed the assessment and treatment plan with the patient. The patient was provided an opportunity to ask questions and all were answered. See above*.  A copy of instructions were sent to the patient via MyChart unless otherwise noted below.    The patient was advised to call back or seek an in-person evaluation if the symptoms worsen or if the condition fails to improve as anticipated.  Time:  I spent 7 minutes with the patient via telehealth technology discussing the above problems/concerns.    [place], PA

## 2022-04-02 NOTE — Patient Instructions (Signed)
Sharon Townsend, thank you for joining Chaney Malling, PA for today's virtual visit.  While this provider is not your primary care provider (PCP), if your PCP is located in our provider database this encounter information will be shared with them immediately following your visit.   [facility] account gives you access to today's visit and all your visits, tests, and labs performed at [facility] " click here if you don't have a [facility] account or go to [email or web address]  Consent: (Patient) Sharon Townsend provided verbal consent for this virtual visit at the beginning of the encounter.  Current Medications:  Current Outpatient Medications:    benzonatate (TESSALON) 100 MG capsule, Take 1 capsule (100 mg total) by mouth 3 (three) times daily as needed., Disp: 30 capsule, Rfl: 0   doxycycline (VIBRA-TABS) 100 MG tablet, Take 1 tablet (100 mg total) by mouth 2 (two) times daily., Disp: 20 tablet, Rfl: 0   EPINEPHrine 0.3 mg/0.3 mL IJ SOAJ injection, Inject 0.3 mg into the muscle as needed for anaphylaxis., Disp: 1 each, Rfl: 0   fluticasone (FLONASE) 50 MCG/ACT nasal spray, Place 2 sprays into both nostrils daily., Disp: 16 g, Rfl: 0   ibuprofen (ADVIL) 200 MG tablet, Take 600 mg by mouth every 6 (six) hours as needed for headache or mild pain. (Patient not taking: Reported on 08/04/2021), Disp: , Rfl:    Multiple Vitamins-Minerals (ONE-A-DAY WOMENS PO), Take 1 tablet by mouth at bedtime., Disp: , Rfl:    Omega-3 Fatty Acids (FISH OIL) 1000 MG CPDR, Take 1,000 mg by mouth at bedtime., Disp: , Rfl:    PARoxetine (PAXIL) 20 MG tablet, Take 1 tablet (20 mg total) by mouth daily., Disp: 90 tablet, Rfl: 2   SUMAtriptan (IMITREX) 50 MG tablet, 1-2 tabs po PRN, May repeat x 1 dose in 2 hours if headache persists or recurs., Disp: 20 tablet, Rfl: 1   topiramate (TOPAMAX) 25 MG tablet, Take one each morning and 2 in the evening., Disp: 180 tablet, Rfl: 2    Medications ordered in this encounter:  No orders of the defined types were placed in this encounter.    *If you need refills on other medications prior to your next appointment, please contact your pharmacy*  Follow-Up: Call back or seek an in-person evaluation if the symptoms worsen or if the condition fails to improve as anticipated.  George Mason [phone]  Other Instructions You likely have a salivary stone. Suck on hard candies such as lemon heads, war heads, or suck a lemon. Stop any OTC medications such as benadryl or zyrtec Use a warm rag to the affected area several times daily. Use 600mg  ibuprofen with food as needed for pain Follow up at an in person clinic if you develop a fever or worsening symptoms   If you have been instructed to have an in-person evaluation today at a local Urgent Care facility, please use the link below. It will take you to a list of all of our available [facility], including address, phone number and hours of operation. Please do not delay care.  [facility]  If you or a family member do not have a primary care provider, use the link below to schedule a visit and establish care. When you choose a [facility] primary care physician or advanced practice provider, you gain a long-term partner in health. Find a Primary Care Provider  Learn more about [facility]  in-office and virtual care options: [facility]

## 2022-04-02 NOTE — Progress Notes (Signed)
The patient no-showed for appointment despite this provider sending direct link x 2 with no response and waiting for at least 10 minutes from appointment time for patient to join. They will be marked as a NS for this appointment/time.   Emalyn Schou M Kalijah Zeiss, PA-C    

## 2022-04-21 ENCOUNTER — Telehealth: Payer: Medicaid Other | Admitting: Physician Assistant

## 2022-04-21 DIAGNOSIS — J069 Acute upper respiratory infection, unspecified: Secondary | ICD-10-CM | POA: Diagnosis not present

## 2022-04-21 MED ORDER — BENZONATATE 100 MG PO CAPS: 100.0000 mg | ORAL_CAPSULE | Freq: Three times a day (TID) | ORAL | 0 refills | Status: AC | PRN

## 2022-04-21 MED ORDER — IPRATROPIUM BROMIDE 0.03 % NA SOLN: 2.0000 | Freq: Two times a day (BID) | NASAL | Status: AC

## 2022-04-21 NOTE — Progress Notes (Signed)
E-Visit for Upper Respiratory Infection   We are sorry you are not feeling well.  Here is how we plan to help!  Based on what you have shared with me, it looks like you may have a viral upper respiratory infection.  Upper respiratory infections are caused by a large number of viruses; however, rhinovirus is the most common cause.   Symptoms vary from person to person, with common symptoms including sore throat, cough, fatigue or lack of energy and feeling of general discomfort.  A low-grade fever of up to 100.4 may present, but is often uncommon.  Symptoms vary however, and are closely related to a person's age or underlying illnesses.  The most common symptoms associated with an upper respiratory infection are nasal discharge or congestion, cough, sneezing, headache and pressure in the ears and face.  These symptoms usually persist for about 3 to 10 days, but can last up to 2 weeks.  It is important to know that upper respiratory infections do not cause serious illness or complications in most cases.    Upper respiratory infections can be transmitted from person to person, with the most common method of transmission being a person's hands.  The virus is able to live on the skin and can infect other persons for up to 2 hours after direct contact.  Also, these can be transmitted when someone coughs or sneezes; thus, it is important to cover the mouth to reduce this risk.  To keep the spread of the illness at [place], good hand hygiene is very important.  This is an infection that is most likely caused by a virus. There are no specific treatments other than to help you with the symptoms until the infection runs its course.  We are sorry you are not feeling well.  Here is how we plan to help!   For nasal congestion, you may use an oral decongestants such as Mucinex D or if you have glaucoma or high blood pressure use plain Mucinex.  Saline nasal spray or nasal drops can help and can safely be used as often as  needed for congestion.  For your congestion, I have prescribed Ipratropium Bromide nasal spray 0.03% two sprays in each nostril 2-3 times a day  If you do not have a history of heart disease, hypertension, diabetes or thyroid disease, prostate/bladder issues or glaucoma, you may also use Sudafed to treat nasal congestion.  It is highly recommended that you consult with a pharmacist or your primary care physician to ensure this medication is safe for you to take.     If you have a cough, you may use cough suppressants such as Delsym and Robitussin.  If you have glaucoma or high blood pressure, you can also use Coricidin HBP.   For cough I have prescribed for you A prescription cough medication called Tessalon Perles 100 mg. You may take 1-2 capsules every 8 hours as needed for cough  If you have a sore or scratchy throat, use a saltwater gargle-  to  teaspoon of salt dissolved in a 4-ounce to 8-ounce glass of warm water.  Gargle the solution for approximately 15-30 seconds and then spit.  It is important not to swallow the solution.  You can also use throat lozenges/cough drops and Chloraseptic spray to help with throat pain or discomfort.  Warm or cold liquids can also be helpful in relieving throat pain.  For headache, pain or general discomfort, you can use Ibuprofen or Tylenol as directed.  Some authorities believe that zinc sprays or the use of Echinacea may shorten the course of your symptoms.   HOME CARE Only take medications as instructed by your medical team. Be sure to drink plenty of fluids. Water is fine as well as fruit juices, sodas and electrolyte beverages. You may want to stay away from caffeine or alcohol. If you are nauseated, try taking small sips of liquids. How do you know if you are getting enough fluid? Your urine should be a pale yellow or almost colorless. Get rest. Taking a steamy shower or using a humidifier may help nasal congestion and ease sore throat pain. You can  place a towel over your head and breathe in the steam from hot water coming from a faucet. Using a saline nasal spray works much the same way. Cough drops, hard candies and sore throat lozenges may ease your cough. Avoid close contacts especially the very young and the elderly Cover your mouth if you cough or sneeze Always remember to wash your hands.   GET HELP RIGHT AWAY IF: You develop worsening fever. If your symptoms do not improve within 10 days You develop yellow or green discharge from your nose over 3 days. You have coughing fits You develop a severe head ache or visual changes. You develop shortness of breath, difficulty breathing or start having chest pain Your symptoms persist after you have completed your treatment plan  MAKE SURE YOU  Understand these instructions. Will watch your condition. Will get help right away if you are not doing well or get worse.  Thank you for choosing an e-visit.  Your e-visit answers were reviewed by a board certified advanced clinical practitioner to complete your personal care plan. Depending upon the condition, your plan could have included both over the counter or prescription medications.  Please review your pharmacy choice. Make sure the pharmacy is open so you can pick up prescription now. If there is a problem, you may contact your provider through MyChart messaging and have the prescription routed to another pharmacy.  Your safety is important to us. If you have drug allergies check your prescription carefully.   For the next 24 hours you can use MyChart to ask questions about today's visit, request a non-urgent call back, or ask for a work or school excuse. You will get an email in the next two days asking about your experience. I hope that your e-visit has been valuable and will speed your recovery.  I have spent 5 minutes in review of e-visit questionnaire, review and updating patient chart, medical decision making and response to  patient.   Aunisty Reali Z Ward, PA-C      

## 2022-05-14 ENCOUNTER — Telehealth: Payer: Medicaid Other | Admitting: Family Medicine

## 2022-05-14 ENCOUNTER — Ambulatory Visit: Payer: Medicaid Other

## 2022-05-14 DIAGNOSIS — Z91018 Allergy to other foods: Secondary | ICD-10-CM

## 2022-05-14 NOTE — Progress Notes (Signed)
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 your condition warrants further evaluation and I recommend that you be seen in a face to face visit.   NOTE: There will be NO CHARGE for this eVisit

## 2022-06-09 ENCOUNTER — Ambulatory Visit: Payer: Medicaid Other | Admitting: Family Medicine

## 2022-06-10 ENCOUNTER — Encounter (HOSPITAL_COMMUNITY): Payer: Self-pay | Admitting: *Deleted

## 2022-06-10 ENCOUNTER — Other Ambulatory Visit: Payer: Self-pay

## 2022-06-10 ENCOUNTER — Emergency Department (HOSPITAL_COMMUNITY)
Admission: EM | Admit: 2022-06-10 | Discharge: 2022-06-10 | Disposition: A | Discharge status: Home / Self Care | Payer: Medicaid Other | Attending: Emergency Medicine | Admitting: Emergency Medicine

## 2022-06-10 DIAGNOSIS — R110 Nausea: Secondary | ICD-10-CM | POA: Insufficient documentation

## 2022-06-10 DIAGNOSIS — Z91010 Allergy to peanuts: Secondary | ICD-10-CM | POA: Diagnosis not present

## 2022-06-10 DIAGNOSIS — T781XXA Other adverse food reactions, not elsewhere classified, initial encounter: Secondary | ICD-10-CM | POA: Diagnosis not present

## 2022-06-10 DIAGNOSIS — Z91040 Latex allergy status: Secondary | ICD-10-CM | POA: Insufficient documentation

## 2022-06-10 DIAGNOSIS — T7840XA Allergy, unspecified, initial encounter: Secondary | ICD-10-CM

## 2022-06-10 MED ORDER — FAMOTIDINE IN NACL 20-0.9 MG/50ML-% IV SOLN
20.0000 mg | Freq: Once | INTRAVENOUS | Status: AC
  Administered 2022-06-10: 20 mg via INTRAVENOUS
  Filled 2022-06-10: qty 50

## 2022-06-10 MED ORDER — DIPHENHYDRAMINE HCL 50 MG/ML IJ SOLN
25.0000 mg | Freq: Once | INTRAMUSCULAR | Status: AC
  Administered 2022-06-10: 25 mg via INTRAVENOUS
  Filled 2022-06-10: qty 1

## 2022-06-10 MED ORDER — EPINEPHRINE 0.3 MG/0.3ML IJ SOAJ: 0.3000 mg | INTRAMUSCULAR | 0 refills | Status: AC | PRN

## 2022-06-10 MED ORDER — DEXAMETHASONE SODIUM PHOSPHATE 10 MG/ML IJ SOLN
10.0000 mg | Freq: Once | INTRAMUSCULAR | Status: AC
  Administered 2022-06-10: 10 mg via INTRAVENOUS
  Filled 2022-06-10: qty 1

## 2022-06-10 MED ORDER — PREDNISONE 10 MG PO TABS: 20.0000 mg | ORAL_TABLET | Freq: Every day | ORAL | 0 refills | Status: DC

## 2022-06-10 NOTE — ED Provider Notes (Signed)
[facility] Provider Note   CSN: [id number] Arrival date & time: 06/10/22  1543     History  Chief Complaint  Patient presents with   Allergic Reaction    Sharon Townsend is a 32 y.o. female.   Allergic Reaction    Patient presents to the emergency department due to allergic reaction.  Patient has an allergy to tree nuts, accidentally ingested cashew at 1530, injected EpiPen twice.  Her preceding symptom was nausea, hives, feeling her throat was closing up.  Her throat feels improved although she still feels nauseated, no vomiting, airways patent.  Home Medications Prior to Admission medications   Medication Sig Start Date End Date Taking? Authorizing Provider  predniSONE (DELTASONE) 10 MG tablet Take 2 tablets (20 mg total) by mouth daily. 06/10/22  Yes Sherrill Raring, PA-C  doxycycline (VIBRA-TABS) 100 MG tablet Take 1 tablet (100 mg total) by mouth 2 (two) times daily. 03/04/22   Brunetta Jeans, PA-C  EPINEPHrine 0.3 mg/0.3 mL IJ SOAJ injection Inject 0.3 mg into the muscle as needed for anaphylaxis. 06/10/22   Sherrill Raring, PA-C  fluticasone (FLONASE) 50 MCG/ACT nasal spray Place 2 sprays into both nostrils daily. 10/09/21   Mar Daring, PA-C  ibuprofen (ADVIL) 200 MG tablet Take 600 mg by mouth every 6 (six) hours as needed for headache or mild pain. Patient not taking: Reported on 08/04/2021    [provider]  Multiple Vitamins-Minerals (ONE-A-DAY WOMENS PO) Take 1 tablet by mouth at bedtime.    [provider]  Omega-3 Fatty Acids (FISH OIL) 1000 MG CPDR Take 1,000 mg by mouth at bedtime.    [provider]  PARoxetine (PAXIL) 20 MG tablet Take 1 tablet (20 mg total) by mouth daily. 08/04/21   Libby Maw, MD  SUMAtriptan (IMITREX) 50 MG tablet 1-2 tabs po PRN, May repeat x 1 dose in 2 hours if headache persists or recurs. 08/04/21   Libby Maw, MD  topiramate (TOPAMAX) 25 MG  tablet Take one each morning and 2 in the evening. 08/04/21   Libby Maw, MD      Allergies    Peanut oil, Latex, and Penicillins    Review of Systems   Review of Systems  Physical Exam Updated Vital Signs BP (!) 156/102 (BP Location: Right Arm)   Pulse 99   Temp 98.7 F (37.1 C) (Oral)   Resp (!) 29   Ht 5' (1.524 m)   Wt 81.6 kg   SpO2 99%   BMI 35.15 kg/m  Physical Exam Vitals and nursing note reviewed. Exam conducted with a chaperone present.  Constitutional:      Appearance: Normal appearance.  HENT:     Head: Normocephalic and atraumatic.     Mouth/Throat:     Comments: Uvula is midline Eyes:     General: No scleral icterus.       Right eye: No discharge.        Left eye: No discharge.     Extraocular Movements: Extraocular movements intact.     Pupils: Pupils are equal, round, and reactive to light.  Cardiovascular:     Rate and Rhythm: Normal rate and regular rhythm.     Pulses: Normal pulses.     Heart sounds: Normal heart sounds. No murmur heard.    No friction rub. No gallop.  Pulmonary:     Effort: Pulmonary effort is normal. No respiratory distress.  Breath sounds: Normal breath sounds.     Comments: No stridor Abdominal:     General: Abdomen is flat. Bowel sounds are normal. There is no distension.     Palpations: Abdomen is soft.     Tenderness: There is no abdominal tenderness.  Skin:    General: Skin is warm and dry.     Coloration: Skin is not jaundiced.     Comments: Hives to upper torso  Neurological:     Mental Status: She is alert. Mental status is at baseline.     Coordination: Coordination normal.     ED Results / Procedures / Treatments   Labs (all labs ordered are listed, but only abnormal results are displayed) Labs Reviewed - No data to display  EKG None  Radiology No results found.  Procedures Procedures    Medications Ordered in ED Medications  diphenhydrAMINE (BENADRYL) injection 25 mg (25 mg  Intravenous Given 06/10/22 1940)  dexamethasone (DECADRON) injection 10 mg (10 mg Intravenous Given 06/10/22 1940)  famotidine (PEPCID) IVPB 20 mg premix (20 mg Intravenous New Bag/Given 06/10/22 1942)    ED Course/ Medical Decision Making/ A&P                             Medical Decision Making Risk Prescription drug management.   Patient presents to the emergency department due to allergic reaction.  Clinically, patient is not in anaphylaxis.  She can use the EpiPen about 3 and half hours prior to arrival.  Patient is in regular rhythm, will observe and treat symptomatically.  Patient feels improved after steroids, Pepcid, Benadryl.    I reevaluated patient multiple times, she is feeling improved.  No uvular edema, hives, or stridor on reevaluation.  Will send home with steroid burst and refill of the EpiPen.  Return precautions were discussed with the patient who verbalized understanding.  Stable for outpatient follow-up at this time.        Final Clinical Impression(s) / ED Diagnoses Final diagnoses:  Allergic reaction, initial encounter    Rx / DC Orders ED Discharge Orders          Ordered    EPINEPHrine 0.3 mg/0.3 mL IJ SOAJ injection  As needed        06/10/22 2021    predniSONE (DELTASONE) 10 MG tablet  Daily        06/10/22 2021              Sherrill Raring, PA-C 06/10/22 2024    Tretha Sciara, MD 06/11/22 1455

## 2022-06-10 NOTE — Discharge Instructions (Signed)
Take the steroid burst as prescribed, 40 mg a day for 5 days.  EpiPen sent to your pharmacy for refill, use as needed for anaphylaxis.  Return to the ED for new or concerning symptoms.

## 2022-06-10 NOTE — ED Provider Triage Note (Signed)
Emergency Medicine Provider Triage Evaluation Note  Sharon Townsend , a 32 y.o. female  was evaluated in triage.  Pt complains of allergic reaction to cashews approximately 10 minutes ago.  Reports she was playing with her daughter when suddenly one of the chocolate covered cashews went in her mouth.  She is allergic to this and tree nuts.  Did feel her respiration getting worrisome, felt that her throat was swelling and closing therefore she used her EpiPen x 2.  She reports improvement in her symptoms.  She has not had any nausea, no vomiting, no rashes.  Review of Systems  Positive: sob Negative: rash  Physical Exam  There were no vitals taken for this visit. Gen:   Awake, no distress   Resp:  Normal effort  MSK:   Moves extremities without difficulty  Other:  Oropharynx is clear without any erythema or swelling.  Lungs are clear to auscultation without any signs of wheezing.  Abdomen is soft nontender to palpation.  Medical Decision Making  Medically screening exam initiated at 3:46 PM.  Appropriate orders placed.  Sharon Townsend was informed that the remainder of the evaluation will be completed by another provider, this initial triage assessment does not replace that evaluation, and the importance of remaining in the ED until their evaluation is complete.     Janeece Fitting, PA-C 06/10/22 1550

## 2022-06-10 NOTE — ED Triage Notes (Signed)
Here by POV with husband s/p allergic reaction, sx onset 1530 when ingested, reports allergy to tree nuts, chocolate covered cashews, took 2 epi pens PTA 1530 and 1545, denies other meds, denies itching at this time. Alert, NAD, calm, interactive. Throat unremarkable, LS CTA. No obvious urticaria.

## 2022-06-22 ENCOUNTER — Ambulatory Visit (INDEPENDENT_AMBULATORY_CARE_PROVIDER_SITE_OTHER): Payer: Medicaid Other

## 2022-06-22 ENCOUNTER — Ambulatory Visit
Admission: RE | Admit: 2022-06-22 | Discharge: 2022-06-22 | Disposition: A | Discharge status: Home / Self Care | Payer: Medicaid Other | Source: Ambulatory Visit | Attending: Physician Assistant | Admitting: Physician Assistant

## 2022-06-22 VITALS — BP 138/92 | HR 91 | Temp 99.3°F | Resp 17

## 2022-06-22 DIAGNOSIS — R059 Cough, unspecified: Secondary | ICD-10-CM

## 2022-06-22 NOTE — ED Provider Notes (Signed)
[name]    CSN: [id number] Arrival date & time: 06/22/22  1444      History   Chief Complaint Chief Complaint  Patient presents with   Generalized Body Aches    Joint and chest pain. I went to pre employment and my tb test came out inclusive. - Entered by patient   Chills   Chest Tightness    HPI Sharon Townsend is a 32 y.o. female.   Pt which she had a preemployment physical on March 21.  Patient reports her QuantiFERON test was indeterminate.  Patient was advised to come to the urgent care and get a chest x-ray.  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  She reports her sister is not on any medications  The history is provided by the patient. No language interpreter was used.    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;  Service: Obstetrics;  Laterality: N/A;   child birth  2016   [place] N/A 02/21/2020   Procedure: [place], WITH NOVASURE ABLATION;  Surgeon: Woodroe Mode, MD;  Location: [facility];  Service: Gynecology;  Laterality: N/A;   MANDIBLE SURGERY     renal stents     TONSILLECTOMY     TUBAL LIGATION  06/14/2016    OB History     Gravida   4   Para  3   Term  1   Preterm  2   AB  1   Living  2      SAB  1   IAB  0   Ectopic  0   Multiple  0   Live Births  2            Home Medications    Prior to Admission medications   Medication Sig Start Date End Date Taking? Authorizing Provider  doxycycline (VIBRA-TABS) 100 MG tablet Take 1 tablet (100 mg total) by mouth 2 (two) times daily. 03/04/22   Brunetta Jeans, PA-C  EPINEPHrine 0.3 mg/0.3 mL IJ SOAJ injection Inject 0.3 mg into the muscle as needed for anaphylaxis. 06/10/22   Sherrill Raring, PA-C  fluticasone (FLONASE) 50 MCG/ACT nasal spray Place 2 sprays into both nostrils daily. 10/09/21   Mar Daring, PA-C  ibuprofen (ADVIL) 200 MG tablet Take 600 mg by mouth every 6 (six) hours as needed for headache or mild pain. Patient not taking: Reported on 08/04/2021    [provider]  Multiple Vitamins-Minerals (ONE-A-DAY WOMENS PO) Take 1 tablet by mouth at bedtime.    [provider]  Omega-3 Fatty Acids (FISH OIL) 1000 MG CPDR Take 1,000 mg by mouth at  bedtime.    [provider]  PARoxetine (PAXIL) 20 MG tablet Take 1 tablet (20 mg total) by mouth daily. 08/04/21   Libby Maw, MD  predniSONE (DELTASONE) 10 MG tablet Take 2 tablets (20 mg total) by mouth daily. 06/10/22   Sherrill Raring, PA-C  SUMAtriptan (IMITREX) 50 MG tablet 1-2 tabs po PRN, May repeat x 1 dose in 2 hours if headache persists or recurs. 08/04/21   Libby Maw, MD  topiramate (TOPAMAX) 25 MG tablet Take one each morning and 2 in the evening. 08/04/21   Libby Maw, MD    Family History Family History  Problem Relation Age of Onset   Diabetes Mother    Breast cancer Mother 79   Diabetes Father    Hypertension Father    Breast cancer Maternal Grandmother    Ovarian cancer Paternal Grandmother     Social History Social History   Tobacco Use   Smoking status: Never   Smokeless tobacco: Never  Vaping Use   Vaping Use: Never  used  Substance Use Topics   Alcohol use: No    Comment: before preg   Drug use: No     Allergies   Peanut oil, Latex, and Penicillins   Review of Systems Review of Systems  All other systems reviewed and are negative.    Physical Exam Triage Vital Signs ED Triage Vitals  Enc Vitals Group     BP 06/22/22 1528 (!) 138/92     Pulse Rate 06/22/22 1528 91     Resp 06/22/22 1528 17     Temp 06/22/22 1528 99.3 F (37.4 C)     Temp Source 06/22/22 1528 Oral     SpO2 06/22/22 1528 99 %     Weight --      Height --      Head Circumference --      Peak Flow --      Pain Score 06/22/22 1531 6     Pain Loc --      Pain Edu? --      Excl. in [place]? --    No data found.  Updated Vital Signs BP (!) 138/92 (BP Location: Left Arm)   Pulse 91   Temp 99.3 F (37.4 C) (Oral)   Resp 17   SpO2 99%   Visual Acuity Right Eye Distance:   Left Eye Distance:   Bilateral Distance:    Right Eye Near:   Left Eye Near:    Bilateral Near:     Physical Exam Vitals and nursing note reviewed.  Constitutional:      Appearance: She is well-developed.  HENT:     Head: Normocephalic.  Cardiovascular:     Rate and Rhythm: Normal rate.  Pulmonary:     Effort: Pulmonary effort is normal.  Abdominal:     General: There is no distension.  Musculoskeletal:        General: Normal range of motion.     Cervical back: Normal range of motion.  Neurological:     General: No focal deficit present.     Mental Status: She is alert and oriented to person, place, and time.  Psychiatric:        Mood and Affect: Mood normal.      UC Treatments / Results  Labs (all labs ordered are listed, but only abnormal results are displayed) Labs Reviewed - No data to display  EKG   Radiology No results found.  Procedures Procedures (including  critical care time)  Medications Ordered in UC Medications - No data to display  Initial Impression / Assessment and Plan / UC Course  I have reviewed  the triage vital signs and the nursing notes.  Pertinent labs & imaging results that were available during my care of the patient were reviewed by me and considered in my medical decision making (see chart for details).     DM: Patient reports her sister has TB.  Patient reports sister is not taking any medications.  I ask if this is because she has been treated Final Clinical Impressions(s) / UC Diagnoses   Final diagnoses:  Cough, unspecified type   Discharge Instructions   None    ED Prescriptions   None    PDMP not reviewed this encounter. An After Visit Summary was printed and given to the patient.       Fransico Meadow, [place] 06/22/22 [id number]

## 2022-06-22 NOTE — ED Triage Notes (Signed)
Pt presents with chills, generalized body aches and chest tightness X 1 week; pt states she had a pre employment screening and her TB blood work came back inconclusive.

## 2022-06-22 NOTE — Discharge Instructions (Signed)
Your chest xray is negative.

## 2022-07-01 ENCOUNTER — Telehealth: Payer: Medicaid Other | Admitting: Physician Assistant

## 2022-07-01 DIAGNOSIS — J208 Acute bronchitis due to other specified organisms: Secondary | ICD-10-CM

## 2022-07-01 MED ORDER — PREDNISONE 10 MG (21) PO TBPK: ORAL_TABLET | ORAL | 0 refills | Status: DC

## 2022-07-01 MED ORDER — ALBUTEROL SULFATE HFA 108 (90 BASE) MCG/ACT IN AERS: 1.0000 | INHALATION_SPRAY | Freq: Four times a day (QID) | RESPIRATORY_TRACT | 0 refills | Status: DC | PRN

## 2022-07-01 MED ORDER — BENZONATATE 100 MG PO CAPS: 100.0000 mg | ORAL_CAPSULE | Freq: Three times a day (TID) | ORAL | 0 refills | Status: DC | PRN

## 2022-07-01 NOTE — Progress Notes (Signed)
E-Visit for Cough  We are sorry that you are not feeling well.  Here is how we plan to help!  Based on your presentation I believe you most likely have A cough due to a virus.  This is called viral bronchitis and is best treated by rest, plenty of fluids and control of the cough.  You may use Ibuprofen or Tylenol as directed to help your symptoms.     In addition you may use A prescription cough medication called Tessalon Perles 100mg . You may take 1-2 capsules every 8 hours as needed for your cough.  Prednisone 10 mg daily for 6 days (see taper instructions below)  Directions for 6 day taper: Day 1: 2 tablets before breakfast, 1 after both lunch & dinner and 2 at bedtime Day 2: 1 tab before breakfast, 1 after both lunch & dinner and 2 at bedtime Day 3: 1 tab at each meal & 1 at bedtime Day 4: 1 tab at breakfast, 1 at lunch, 1 at bedtime Day 5: 1 tab at breakfast & 1 tab at bedtime Day 6: 1 tab at breakfast  From your responses in the eVisit questionnaire you describe inflammation in the upper respiratory tract which is causing a significant cough.  This is commonly called Bronchitis and has four common causes:   Allergies Viral Infections Acid Reflux Bacterial Infection Allergies, viruses and acid reflux are treated by controlling symptoms or eliminating the cause. An example might be a cough caused by taking certain blood pressure medications. You stop the cough by changing the medication. Another example might be a cough caused by acid reflux. Controlling the reflux helps control the cough.  USE OF BRONCHODILATOR ("RESCUE") INHALERS: There is a risk from using your bronchodilator too frequently.  The risk is that over-reliance on a medication which only relaxes the muscles surrounding the breathing tubes can reduce the effectiveness of medications prescribed to reduce swelling and congestion of the tubes themselves.  Although you feel brief relief from the bronchodilator inhaler, your  asthma may actually be worsening with the tubes becoming more swollen and filled with mucus.  This can delay other crucial treatments, such as oral steroid medications. If you need to use a bronchodilator inhaler daily, several times per day, you should discuss this with your provider.  There are probably better treatments that could be used to keep your asthma under control.     HOME CARE Only take medications as instructed by your medical team. Complete the entire course of an antibiotic. Drink plenty of fluids and get plenty of rest. Avoid close contacts especially the very young and the elderly Cover your mouth if you cough or cough into your sleeve. Always remember to wash your hands A steam or ultrasonic humidifier can help congestion.   GET HELP RIGHT AWAY IF: You develop worsening fever. You become short of breath You cough up blood. Your symptoms persist after you have completed your treatment plan MAKE SURE YOU  Understand these instructions. Will watch your condition. Will get help right away if you are not doing well or get worse.    Thank you for choosing an e-visit.  Your e-visit answers were reviewed by a board certified advanced clinical practitioner to complete your personal care plan. Depending upon the condition, your plan could have included both over the counter or prescription medications.  Please review your pharmacy choice. Make sure the pharmacy is open so you can pick up prescription now. If there is a problem, you  may contact your provider through [employer] and have the prescription routed to another pharmacy.  Your safety is important to [place]. If you have drug allergies check your prescription carefully.   For the next 24 hours you can use MyChart to ask questions about today's visit, request a non-urgent call back, or ask for a work or school excuse. You will get an email in the next two days asking about your experience. I hope that your e-visit has  been valuable and will speed your recovery.  I have spent 5 minutes in review of e-visit questionnaire, review and updating patient chart, medical decision making and response to patient.   Mar Daring, PA-C

## 2022-07-22 ENCOUNTER — Telehealth: Payer: Medicaid Other | Admitting: Family Medicine

## 2022-07-22 DIAGNOSIS — T3695XA Adverse effect of unspecified systemic antibiotic, initial encounter: Secondary | ICD-10-CM

## 2022-07-22 DIAGNOSIS — B379 Candidiasis, unspecified: Secondary | ICD-10-CM | POA: Diagnosis not present

## 2022-07-23 MED ORDER — FLUCONAZOLE 150 MG PO TABS: 150.0000 mg | ORAL_TABLET | Freq: Every day | ORAL | 0 refills | Status: DC

## 2022-07-23 NOTE — Progress Notes (Signed)

## 2022-07-28 ENCOUNTER — Ambulatory Visit: Payer: Medicaid Other | Admitting: Nurse Practitioner

## 2022-08-07 NOTE — Progress Notes (Unsigned)
Subjective:    Sharon Townsend - 32 y.o. female MRN [id number]  Date of birth: [birth year]  HPI  Sharon Townsend is to establish care. She is accompanied by her husband.  Current issues and/or concerns: - Requests refills of Imitrex and Topamax for headache management. Doing well on regimen, no issues/concerns.  - Requests refills of Paxil. Reports history of bipolar and anxiety. Anxiety related to work-life balance. Reports she was established with a specialist in the past and was medically cleared to return to Primary Care for management. She denies thoughts of self-harm, suicidal ideations, homicidal ideations. 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 She does not check blood pressure outside of office. She denies red flag symptoms such as but not limited to chest pain, shortness of breath, worst headache of life, nausea/vomiting.  - Reports her employer screened her twice for TB by blood draw in March 2024. 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  - No further issues/concerns for discussion today.   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 She has never used smokeless tobacco. She reports that she does not drink alcohol and does not use drugs.   Family History  family history includes Breast cancer in her maternal grandmother; Breast cancer (age of onset: 62) in her mother; Diabetes  in her father and mother; Hypertension in her father; Ovarian cancer in her paternal grandmother.   Medications: reviewed and updated   Objective:   Physical Exam BP 122/87   Pulse 90   Temp 98.9 F (37.2 C) (Oral)   Resp 20   Ht 5' 0.05" (1.525 m)   Wt 195 lb (88.5 kg)   SpO2 96%   BMI 38.02 kg/m   Physical Exam HENT:     Head: Normocephalic and atraumatic.  Eyes:     Extraocular Movements: Extraocular movements intact.     Conjunctiva/sclera: Conjunctivae normal.     Pupils: Pupils are equal, round, and reactive to light.  Cardiovascular:     Rate and Rhythm: Normal rate and regular rhythm.     Pulses: Normal pulses.     Heart sounds: Normal heart sounds.  Pulmonary:     Effort: Pulmonary effort is normal.     Breath sounds: Normal breath sounds.  Musculoskeletal:     Cervical back: Normal range of motion and neck supple.  Neurological:     General: No focal deficit present.     Mental Status: She is alert and oriented to person, place, and time.  Psychiatric:        Mood and Affect: Mood normal.        Behavior: Behavior normal.        Assessment & Plan:  1. Encounter to establish care - Patient presents today to establish care. During the interim follow-up with primary provider as scheduled.  -  Return for annual physical examination, labs, and health maintenance. Arrive fasting meaning having no food for at least 8 hours prior to appointment. You may have only water or black coffee. Please take scheduled medications as normal.  2. Intractable chronic migraine without aura and without status migrainosus - Continue Sumatriptan and Topiramate as prescribed. Counseled on medication adherence/adverse effects.  - Follow-up with primary provider in 3 months or sooner if needed.  - SUMAtriptan (IMITREX) 50 MG tablet; Take 50 mg (1 tablet total) by mouth at the start of the headache. May repeat in 2 hours x 1 if headache persists. Max of 2 tablets/24 hours.  Dispense: 30  tablet; Refill: 2 - topiramate (TOPAMAX) 25 MG tablet; Take one each morning and 2 in the evening.  Dispense: 180 tablet; Refill: 2  3. GAD (generalized anxiety disorder) 4. Bipolar affective disorder, remission status unspecified (HCC) - Patient denies thoughts of self-harm, suicidal ideations, homicidal ideations. - Continue Paroxetine as prescribed. Counseled on medication adherence/adverse effects.  - Follow-up with primary provider in 3 months or sooner if needed.  - PARoxetine (PAXIL) 20 MG tablet; Take 1 tablet (20 mg total) by mouth daily.  Dispense: 90 tablet; Refill: 0  5. Screening-pulmonary TB - Routine screening.  - QuantiFERON-TB Gold Plus   Patient was given clear instructions to go to Emergency Department or return to medical center if symptoms don't improve, worsen, or new problems develop.The patient verbalized understanding.  I discussed the assessment and treatment plan with the patient. The patient was provided an opportunity to ask questions and all were answered. The patient agreed with the plan and demonstrated an understanding of the instructions.   The patient was advised to call back or seek an in-person evaluation if the symptoms worsen or if the condition fails to improve as anticipated.    Ricky Stabs, NP 08/12/2022, 11:54 AM Primary Care at [facility]

## 2022-08-12 ENCOUNTER — Encounter: Payer: Self-pay | Admitting: Family

## 2022-08-12 ENCOUNTER — Ambulatory Visit: Payer: Medicaid Other | Admitting: Family

## 2022-08-12 VITALS — BP 122/87 | HR 90 | Temp 98.9°F | Resp 20 | Ht 60.05 in | Wt 195.0 lb

## 2022-08-12 DIAGNOSIS — G43719 Chronic migraine without aura, intractable, without status migrainosus: Secondary | ICD-10-CM

## 2022-08-12 DIAGNOSIS — F411 Generalized anxiety disorder: Secondary | ICD-10-CM

## 2022-08-12 DIAGNOSIS — F319 Bipolar disorder, unspecified: Secondary | ICD-10-CM

## 2022-08-12 DIAGNOSIS — Z7689 Persons encountering health services in other specified circumstances: Secondary | ICD-10-CM

## 2022-08-12 DIAGNOSIS — Z111 Encounter for screening for respiratory tuberculosis: Secondary | ICD-10-CM | POA: Diagnosis not present

## 2022-08-12 MED ORDER — TOPIRAMATE 25 MG PO TABS: ORAL_TABLET | ORAL | 2 refills | Status: DC

## 2022-08-12 MED ORDER — SUMATRIPTAN SUCCINATE 50 MG PO TABS: ORAL_TABLET | ORAL | 2 refills | Status: AC

## 2022-08-12 MED ORDER — PAROXETINE HCL 20 MG PO TABS: 20.0000 mg | ORAL_TABLET | Freq: Every day | ORAL | 0 refills | Status: DC

## 2022-08-12 NOTE — Patient Instructions (Signed)
Thank you for choosing Primary Care at [facility] for your medical home!    Sharon Townsend was seen by Rema Fendt, NP today.   Isaiah Blakes Lomax's primary care provider is Rema Fendt, NP.   For the best care possible,  you should try to see Ricky Stabs, NP whenever you come to office.   We look forward to seeing you again soon!  If you have any questions about your visit today,  please call us at [phone]  Or feel free to reach your provider via MyChart.    Keeping you healthy   Get these tests Blood pressure- Have your blood pressure checked once a year by your healthcare provider.  Normal blood pressure is 120/80. Weight- Have your body mass index (BMI) calculated to screen for obesity.  BMI is a measure of body fat based on height and weight. You can also calculate your own BMI at https://www.west-esparza.com/. Cholesterol- Have your cholesterol checked regularly starting at age 9, sooner may be necessary if you have diabetes, high blood pressure, if a family member developed heart diseases at an early age or if you smoke.  Chlamydia, HIV, and other sexual transmitted disease- Get screened each year until the age of 15 then within three months of each new sexual partner. Diabetes- Have your blood sugar checked regularly if you have high blood pressure, high cholesterol, a family history of diabetes or if you are overweight.   Get these vaccines Flu shot- Every fall. Tetanus shot- Every 10 years. Menactra- Single dose; prevents meningitis.   Take these steps Don't smoke- If you do smoke, ask your healthcare provider about quitting. For tips on how to quit, go to www.smokefree.gov or call 1-800-QUIT-NOW. Be physically active- Exercise 5 days a week for at least 30 minutes.  If you are not already physically active start slow and gradually work up to 30 minutes of moderate physical activity.  Examples of moderate activity include walking briskly, mowing the yard,  dancing, swimming bicycling, etc. Eat a healthy diet- Eat a variety of healthy foods such as fruits, vegetables, low fat milk, low fat cheese, yogurt, lean meats, poultry, fish, beans, tofu, etc.  For more information on healthy eating, go to www.thenutritionsource.org Drink alcohol in moderation- Limit alcohol intake two drinks or less a day.  Never drink and drive. Dentist- Brush and floss teeth twice daily; visit your dentis twice a year. Depression-Your emotional health is as important as your physical health.  If you're feeling down, losing interest in things you normally enjoy please talk with your healthcare provider. Gun Safety- If you keep a gun in your home, keep it unloaded and with the safety lock on.  Bullets should be stored separately. Helmet use- Always wear a helmet when riding a motorcycle, bicycle, rollerblading or skateboarding. Safe sex- If you may be exposed to a sexually transmitted infection, use a condom Seat belts- Seat bels can save your life; always wear one. Smoke/Carbon Monoxide detectors- These detectors need to be installed on the appropriate level of your home.  Replace batteries at least once a year. Skin Cancer- When out in the sun, cover up and use sunscreen SPF 15 or higher. Violence- If anyone is threatening or hurting you, please tell your healthcare provider.

## 2022-08-12 NOTE — Progress Notes (Signed)
Needs refill on medication  on headache medication - imitrex -topamax -paxil  Discuss BP

## 2022-08-15 LAB — QUANTIFERON-TB GOLD PLUS
QuantiFERON Mitogen Value: >10 IU/mL
QuantiFERON Nil Value: 0.02 IU/mL
QuantiFERON TB1 Ag Value: 0.06 IU/mL
QuantiFERON TB2 Ag Value: 0.04 IU/mL
QuantiFERON-TB Gold Plus: NEGATIVE

## 2022-08-18 ENCOUNTER — Other Ambulatory Visit: Payer: Self-pay

## 2022-08-18 ENCOUNTER — Ambulatory Visit: Payer: Medicaid Other | Admitting: Family Medicine

## 2022-08-18 ENCOUNTER — Telehealth: Payer: Medicaid Other | Admitting: Family Medicine

## 2022-08-18 ENCOUNTER — Ambulatory Visit
Admission: RE | Admit: 2022-08-18 | Discharge: 2022-08-18 | Disposition: A | Discharge status: Home / Self Care | Payer: Medicaid Other | Source: Ambulatory Visit | Attending: Internal Medicine | Admitting: Internal Medicine

## 2022-08-18 VITALS — BP 130/89 | HR 83 | Temp 98.7°F | Resp 18

## 2022-08-18 DIAGNOSIS — G43019 Migraine without aura, intractable, without status migrainosus: Secondary | ICD-10-CM

## 2022-08-18 DIAGNOSIS — G43719 Chronic migraine without aura, intractable, without status migrainosus: Secondary | ICD-10-CM

## 2022-08-18 MED ORDER — METOCLOPRAMIDE HCL 5 MG/ML IJ SOLN
5.0000 mg | Freq: Once | INTRAMUSCULAR | Status: AC
  Administered 2022-08-18: 5 mg via INTRAMUSCULAR

## 2022-08-18 MED ORDER — KETOROLAC TROMETHAMINE 30 MG/ML IJ SOLN
30.0000 mg | Freq: Once | INTRAMUSCULAR | Status: AC
Start: 2022-08-18 — End: 2022-08-18
  Administered 2022-08-18: 30 mg via INTRAMUSCULAR

## 2022-08-18 MED ORDER — DEXAMETHASONE SODIUM PHOSPHATE 10 MG/ML IJ SOLN
10.0000 mg | Freq: Once | INTRAMUSCULAR | Status: AC
  Administered 2022-08-18: 10 mg via INTRAMUSCULAR

## 2022-08-18 MED ORDER — DIPHENHYDRAMINE HCL 50 MG/ML IJ SOLN
25.0000 mg | Freq: Once | INTRAMUSCULAR | Status: AC
  Administered 2022-08-18: 25 mg via INTRAMUSCULAR

## 2022-08-18 NOTE — ED Triage Notes (Signed)
Pt here for HA x 2 days and has tried all home meds without relief; pt sts hx of same in past

## 2022-08-18 NOTE — Progress Notes (Signed)
[place]   Needs to be seen in person to get migraine cocktail that works best for her.  Patient acknowledged agreement and understanding of the plan.

## 2022-08-18 NOTE — ED Provider Notes (Signed)
[name]    CSN: [id number] Arrival date & time: 08/18/22  1236      History   Chief Complaint Chief Complaint  Patient presents with   Headache    The cocktail usually helps.. I took my regular medicine last night and a Tylenol ibuprofen coffee mix this morning that didn't work either - Entered by patient    HPI Sharon Townsend is a 32 y.o. female.   Patient presents to urgent care for evaluation of headache that started 2 nights ago. She works night shift and states that if she doesn't get enough sleep before working night shift she gets these types of headaches. She woke up this morning and took tylenol, ibuprofen, and sumatriptan this morning and this did not help with headache. History of chronic migraines and states this happens approximately 3-4 times per year. Headache is to the bilateral temples, the forehead, and to the posterior aspect of the head (occiput). Headache is currently a 6-7 on a scale of 0-10. Reports nausea without vomiting. Also reports phonophobia and photophobia. No dizziness, fever, chills, sore throat, or recent trauma/injuries to the head. States she has "the sparkles" in her vision (stars) but always gets this whenever she has a migraine and usually goes away after headache cocktail. Called PCP this morning who recommended she come to urgent care for evaluation and migraine cocktail. Last dose of ibuprofen was 600mg  at 7:30am this morning (6 hours ago).    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   Menorrhagia with regular cycle 12/18/2019   Dyspareunia in female 12/18/2019   Family history of  breast cancer 12/18/2019   Family history of ovarian cancer 12/18/2019    Past Surgical History:  Procedure Laterality Date   CESAREAN SECTION N/A 06/14/2016   Procedure: CESAREAN SECTION;  Surgeon: Philip Aspen, DO;  Location: [facility];  Service: Obstetrics;  Laterality: N/A;   child birth  2016   CHOLECYSTECTOMY     DILITATION & CURRETTAGE/HYSTROSCOPY WITH NOVASURE ABLATION N/A 02/21/2020   Procedure: DILATATION & CURETTAGE, WITH NOVASURE ABLATION;  Surgeon: Adam Phenix, MD;  Location: [facility];  Service: Gynecology;  Laterality: N/A;   MANDIBLE SURGERY     renal stents     TONSILLECTOMY     TUBAL LIGATION  06/14/2016    OB History     Gravida  4   Para  3   Term  1   Preterm  2   AB  1   Living  2      SAB  1   IAB  0   Ectopic  0   Multiple  0   Live Births  2            Home Medications    Prior to Admission medications   Medication Sig Start Date End Date Taking? Authorizing Provider  albuterol (VENTOLIN HFA) 108 (90 Base) MCG/ACT inhaler Inhale 1-2 puffs into the lungs every 6 (six) hours as needed. 07/01/22   Margaretann Loveless, PA-C  benzonatate (TESSALON) 100 MG capsule  Take 1 capsule (100 mg total) by mouth 3 (three) times daily as needed. 07/01/22   Margaretann Loveless, PA-C  doxycycline (VIBRA-TABS) 100 MG tablet Take 1 tablet (100 mg total) by mouth 2 (two) times daily. 03/04/22   Waldon Merl, PA-C  EPINEPHrine 0.3 mg/0.3 mL IJ SOAJ injection Inject 0.3 mg into the muscle as needed for anaphylaxis. 06/10/22   Theron Arista, PA-C  fluticasone (FLONASE) 50 MCG/ACT nasal spray Place 2 sprays into both nostrils daily. 10/09/21   Margaretann Loveless, PA-C  ibuprofen (ADVIL) 200 MG tablet Take 600 mg by mouth every 6 (six) hours as needed for headache or mild pain. Patient not taking: Reported on 08/04/2021    [provider]  Multiple Vitamins-Minerals (ONE-A-DAY WOMENS PO) Take 1 tablet by mouth at  bedtime.    [provider]  Omega-3 Fatty Acids (FISH OIL) 1000 MG CPDR Take 1,000 mg by mouth at bedtime.    [provider]  PARoxetine (PAXIL) 20 MG tablet Take 1 tablet (20 mg total) by mouth daily. 08/12/22   Rema Fendt, NP  predniSONE (STERAPRED UNI-PAK 21 TAB) 10 MG (21) TBPK tablet 6 day taper; take as directed on package instructions 07/01/22   Margaretann Loveless, PA-C  SUMAtriptan (IMITREX) 50 MG tablet Take 50 mg (1 tablet total) by mouth at the start of the headache. May repeat in 2 hours x 1 if headache persists. Max of 2 tablets/24 hours. 08/12/22   Rema Fendt, NP  topiramate (TOPAMAX) 25 MG tablet Take one each morning and 2 in the evening. 08/12/22   Rema Fendt, NP    Family History Family History  Problem Relation Age of Onset   Diabetes Mother    Breast cancer Mother 76   Diabetes Father    Hypertension Father    Breast cancer Maternal Grandmother    Ovarian cancer Paternal Grandmother     Social History Social History   Tobacco Use   Smoking status: Never   Smokeless tobacco: Never  Vaping Use   Vaping Use: Never used  Substance Use Topics   Alcohol use: No    Comment: before preg   Drug use: No     Allergies   Peanut oil, Latex, and Penicillins   Review of Systems Review of Systems  Neurological:  Positive for headaches.  Per HPI   Physical Exam Triage Vital Signs ED Triage Vitals  Enc Vitals Group     BP 08/18/22 1247 130/89     Pulse Rate 08/18/22 1247 83     Resp 08/18/22 1247 18     Temp 08/18/22 1247 98.7 F (37.1 C)     Temp Source 08/18/22 1247 Oral     SpO2 08/18/22 1247 98 %     Weight --      Height --      Head Circumference --      Peak Flow --      Pain Score 08/18/22 1248 5     Pain Loc --      Pain Edu? --      Excl. in GC? --    No data found.  Updated Vital Signs BP 130/89 (BP Location: Left Arm)   Pulse 83   Temp 98.7 F (37.1 C) (Oral)   Resp 18   SpO2 98%   Visual  Acuity Right Eye Distance:   Left Eye Distance:   Bilateral Distance:    Right Eye Near:   Left  Eye Near:    Bilateral Near:     Physical Exam Vitals and nursing note reviewed.  Constitutional:      Appearance: She is not ill-appearing or toxic-appearing.  HENT:     Head: Normocephalic and atraumatic.     Right Ear: Hearing, tympanic membrane, ear canal and external ear normal.     Left Ear: Hearing, tympanic membrane, ear canal and external ear normal.     Nose: Nose normal.     Mouth/Throat:     Lips: Pink.     Mouth: Mucous membranes are moist. No injury.     Tongue: No lesions. Tongue does not deviate from midline.     Palate: No mass and lesions.     Pharynx: Oropharynx is clear. Uvula midline. No pharyngeal swelling, oropharyngeal exudate, posterior oropharyngeal erythema or uvula swelling.     Tonsils: No tonsillar exudate or tonsillar abscesses.  Eyes:     General: Lids are normal. Vision grossly intact. Gaze aligned appropriately.     Extraocular Movements: Extraocular movements intact.     Conjunctiva/sclera: Conjunctivae normal.  Cardiovascular:     Rate and Rhythm: Normal rate and regular rhythm.     Heart sounds: Normal heart sounds, S1 normal and S2 normal.  Pulmonary:     Effort: Pulmonary effort is normal. No respiratory distress.     Breath sounds: Normal breath sounds and air entry.  Musculoskeletal:     Cervical back: Neck supple.  Skin:    General: Skin is warm and dry.     Capillary Refill: Capillary refill takes less than 2 seconds.     Findings: No rash.  Neurological:     General: No focal deficit present.     Mental Status: She is alert and oriented to person, place, and time. Mental status is at baseline.     Cranial Nerves: Cranial nerves 2-12 are intact. No dysarthria or facial asymmetry.     Sensory: Sensation is intact.     Motor: Motor function is intact.     Coordination: Coordination is intact.     Gait: Gait is intact.     Comments:  Strength and sensation intact to bilateral upper and lower extremities (5/5). Moves all 4 extremities with normal coordination voluntarily. Non-focal neuro exam.   Psychiatric:        Mood and Affect: Mood normal.        Speech: Speech normal.        Behavior: Behavior normal.        Thought Content: Thought content normal.        Judgment: Judgment normal.      UC Treatments / Results  Labs (all labs ordered are listed, but only abnormal results are displayed) Labs Reviewed - No data to display  EKG   Radiology No results found.  Procedures Procedures (including critical care time)  Medications Ordered in UC Medications  ketorolac (TORADOL) 30 MG/ML injection 30 mg (30 mg Intramuscular Given 08/18/22 1341)  metoCLOPramide (REGLAN) injection 5 mg (5 mg Intramuscular Given 08/18/22 1340)  dexamethasone (DECADRON) injection 10 mg (10 mg Intramuscular Given 08/18/22 1340)    Initial Impression / Assessment and Plan / UC Course  I have reviewed the triage vital signs and the nursing notes.  Pertinent labs & imaging results that were available during my care of the patient were reviewed by me and considered in my medical decision making (see chart for details).   1.  Intractable migraine without aura and without  status migrainosus Presentation is consistent with patient's typical migraine headache.  She is neurologically intact to her baseline and headache is atraumatic mechanism, therefore deferred referral to the emergency department for further workup/evaluation. Patient given headache cocktail in clinic and reassessed 20 minutes later without much change in headache. States she usually receives a benadryl injection which seems to help reduce pain. 25mg  IM benadryl given, advised drowsiness precautions. Her husband is driving her home so I am agreeable with giving this medication prior to discharge. Patient speaks of receiving tramadol at the pharmacy in the past after visits for  migraine/migraine cocktail to be used in case headache does not go away with medications provided in urgent care, however I do not feel this is necessary today. PDMP reveiewed and I am unable to see where she has been prescribed tramadol in the past. May use ibuprofen 600mg  every 6 hours as needed for head pain at home starting tomorrow. No NSAID for 24 hours due to medications in clinic. Strict ER and urgent care return precautions discussed. Work note given.   Discussed physical exam and available lab work findings in clinic with patient.  Counseled patient regarding appropriate use of medications and potential side effects for all medications recommended or prescribed today. Discussed red flag signs and symptoms of worsening condition,when to call the PCP office, return to urgent care, and when to seek higher level of care in the emergency department. Patient verbalizes understanding and agreement with plan. All questions answered. Patient discharged in stable condition.    Final Clinical Impressions(s) / UC Diagnoses   Final diagnoses:  Intractable migraine without aura and without status migrainosus     Discharge Instructions      You have been evaluated today for headache.  You were given medicines for your headache in the clinic today which included a strong NSAID, so do not  take ibuprofen or other NSAIDS (Aleve, aspirin, naproxen, ibuprofen, goody powder, etc.) for the next 12 hours.  Starting tomorrow, take 600mg  ibuprofen every 6 hours or tylenol 1,000 every 6 hours as needed for pain.  Avoid areas of loud noise/harsh light and remember to drink plenty of water to stay well hydrated.  Please follow up with your primary care provider for further management of your headaches.  Please seek emergency medical care if you experience worsening or uncontrolled pain, vision changes, recurrent vomiting, difficulty with normal activities, abnormal behavior, difficulty walking, numbness,  weakness, or any other concerning symptoms.   I hope you feel better!       ED Prescriptions   None    PDMP not reviewed this encounter.   Carlisle Beers, [place] 08/18/22 1416

## 2022-08-18 NOTE — Discharge Instructions (Signed)
You have been evaluated today for headache.  You were given medicines for your headache in the clinic today which included a strong NSAID, so do not  take ibuprofen or other NSAIDS (Aleve, aspirin, naproxen, ibuprofen, goody powder, etc.) for the next 12 hours.  Starting tomorrow, take 600mg ibuprofen every 6 hours or tylenol 1,000 every 6 hours as needed for pain.  Avoid areas of loud noise/harsh light and remember to drink plenty of water to stay well hydrated.  Please follow up with your primary care provider for further management of your headaches.  Please seek emergency medical care if you experience worsening or uncontrolled pain, vision changes, recurrent vomiting, difficulty with normal activities, abnormal behavior, difficulty walking, numbness, weakness, or any other concerning symptoms.   I hope you feel better!   

## 2022-08-19 ENCOUNTER — Encounter: Payer: Self-pay | Admitting: Family

## 2022-09-21 ENCOUNTER — Other Ambulatory Visit: Payer: Self-pay

## 2022-09-21 ENCOUNTER — Encounter: Payer: Self-pay | Admitting: *Deleted

## 2022-09-21 ENCOUNTER — Ambulatory Visit
Admission: EM | Admit: 2022-09-21 | Discharge: 2022-09-21 | Disposition: A | Discharge status: Home / Self Care | Payer: Medicaid Other | Attending: Physician Assistant | Admitting: Physician Assistant

## 2022-09-21 DIAGNOSIS — K118 Other diseases of salivary glands: Secondary | ICD-10-CM | POA: Diagnosis not present

## 2022-09-21 HISTORY — DX: Migraine, unspecified, not intractable, without status migrainosus: G43.909

## 2022-09-21 MED ORDER — CEPHALEXIN 500 MG PO CAPS: 500.0000 mg | ORAL_CAPSULE | Freq: Four times a day (QID) | ORAL | 0 refills | Status: AC

## 2022-09-21 NOTE — Discharge Instructions (Addendum)
Try sucking on lemon drops to increase saliva.  Warm salt water gargles 20 minutes 4 times a day

## 2022-09-21 NOTE — ED Triage Notes (Addendum)
Pt reports waking this AM with lump to left jaw area; states lump has increased to general swelling to area. Denies any dental pain. C/O now having HA

## 2022-09-23 NOTE — ED Provider Notes (Signed)
[name]    CSN: [id number] Arrival date & time: 09/21/22  1759      History   Chief Complaint Chief Complaint  Patient presents with   Facial Swelling    HPI Sharon Townsend is a 32 y.o. female.   Patient complains of swelling to left jaw.  Patient reports the swelling has increased over the day.  Patient denies any difficulty swallowing she is not having any difficulty breathing patient denies any fever or chills     Past Medical History:  Diagnosis Date   Bipolar disorder (HCC)    Chronic constipation    Gall bladder stones    Headache    Migraines    PONV (postoperative nausea and vomiting)    Renal disorder    two tubes in left kidney going to bladder    Patient Active Problem List   Diagnosis Date Noted   Anaphylaxis 03/26/2021   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   Menorrhagia with regular cycle 12/18/2019   Dyspareunia in female 12/18/2019   Family history of breast cancer 12/18/2019   Family history of ovarian cancer 12/18/2019    Past Surgical History:  Procedure Laterality Date   CESAREAN SECTION N/A 06/14/2016   Procedure: CESAREAN SECTION;  Surgeon: Philip Aspen, DO;  Location: [facility];  Service: Obstetrics;  Laterality: N/A;   child birth  2016   CHOLECYSTECTOMY     DILITATION & CURRETTAGE/HYSTROSCOPY WITH NOVASURE ABLATION N/A 02/21/2020   Procedure: DILATATION & CURETTAGE, WITH NOVASURE ABLATION;  Surgeon: Adam Phenix, MD;  Location: [facility];  Service: Gynecology;  Laterality: N/A;   MANDIBLE SURGERY     renal stents     TONSILLECTOMY     TUBAL LIGATION  06/14/2016    OB History     Gravida  4   Para  3   Term  1   Preterm  2   AB  1   Living  2      SAB  1   IAB  0   Ectopic  0   Multiple  0   Live Births  2            Home Medications    Prior to Admission medications   Medication Sig Start Date End Date Taking? Authorizing  Provider  cephALEXin (KEFLEX) 500 MG capsule Take 1 capsule (500 mg total) by mouth 4 (four) times daily for 7 days. 09/21/22 09/28/22 Yes Elson Areas, PA-C  Multiple Vitamins-Minerals (ONE-A-DAY WOMENS PO) Take 1 tablet by mouth at bedtime.   Yes [provider]  Omega-3 Fatty Acids (FISH OIL) 1000 MG CPDR Take 1,000 mg by mouth at bedtime.   Yes [provider]  PARoxetine (PAXIL) 20 MG tablet Take 1 tablet (20 mg total) by mouth daily. 08/12/22  Yes Zonia Kief, Amy J, NP  SUMAtriptan (IMITREX) 50 MG tablet Take 50 mg (1 tablet total) by mouth at the start of the headache. May repeat in 2 hours x 1 if headache persists. Max of 2 tablets/24 hours. 08/12/22  Yes Zonia Kief, Amy J, NP  topiramate (TOPAMAX) 25 MG tablet Take one each morning and 2 in the evening. 08/12/22  Yes Zonia Kief, Amy J, NP  albuterol (VENTOLIN HFA) 108 (90 Base) MCG/ACT inhaler Inhale 1-2 puffs into the lungs every 6 (six) hours as needed. 07/01/22   Margaretann Loveless, PA-C  benzonatate (TESSALON) 100 MG capsule Take 1 capsule (100 mg total) by mouth  3 (three) times daily as needed. 07/01/22   Margaretann Loveless, PA-C  doxycycline (VIBRA-TABS) 100 MG tablet Take 1 tablet (100 mg total) by mouth 2 (two) times daily. 03/04/22   Waldon Merl, PA-C  EPINEPHrine 0.3 mg/0.3 mL IJ SOAJ injection Inject 0.3 mg into the muscle as needed for anaphylaxis. 06/10/22   Theron Arista, PA-C  fluticasone (FLONASE) 50 MCG/ACT nasal spray Place 2 sprays into both nostrils daily. 10/09/21   Margaretann Loveless, PA-C  ibuprofen (ADVIL) 200 MG tablet Take 600 mg by mouth every 6 (six) hours as needed for headache or mild pain. Patient not taking: Reported on 08/04/2021    [provider]  predniSONE (STERAPRED UNI-PAK 21 TAB) 10 MG (21) TBPK tablet 6 day taper; take as directed on package instructions 07/01/22   Margaretann Loveless, PA-C    Family History Family History  Problem Relation Age of Onset   Diabetes Mother     Breast cancer Mother 94   Diabetes Father    Hypertension Father    Breast cancer Maternal Grandmother    Ovarian cancer Paternal Grandmother     Social History Social History   Tobacco Use   Smoking status: Never   Smokeless tobacco: Never  Vaping Use   Vaping Use: Never used  Substance Use Topics   Alcohol use: No   Drug use: No     Allergies   Peanut oil, Latex, and Penicillins   Review of Systems Review of Systems  All other systems reviewed and are negative.    Physical Exam Triage Vital Signs ED Triage Vitals [09/21/22 1954]  Enc Vitals Group     BP (!) 169/90     Pulse Rate 60     Resp 18     Temp (!) 97.5 F (36.4 C)     Temp Source Oral     SpO2 99 %     Weight      Height      Head Circumference      Peak Flow      Pain Score 6     Pain Loc      Pain Edu?      Excl. in GC?    No data found.  Updated Vital Signs BP (!) 169/90   Pulse 60   Temp (!) 97.5 F (36.4 C) (Oral)   Resp 18   SpO2 99%   Visual Acuity Right Eye Distance:   Left Eye Distance:   Bilateral Distance:    Right Eye Near:   Left Eye Near:    Bilateral Near:     Physical Exam Vitals and nursing note reviewed.  Constitutional:      Appearance: She is well-developed.  HENT:     Head: Normocephalic.     Mouth/Throat:     Mouth: Mucous membranes are moist.     Comments: Swelling left buccal mucosa, Cardiovascular:     Rate and Rhythm: Normal rate.  Pulmonary:     Effort: Pulmonary effort is normal.  Abdominal:     General: There is no distension.  Musculoskeletal:        General: Normal range of motion.     Cervical back: Normal range of motion.  Skin:    General: Skin is warm.  Neurological:     General: No focal deficit present.     Mental Status: She is alert and oriented to person, place, and time.      UC Treatments / Results  Labs (all labs ordered are listed, but only abnormal results are displayed) Labs Reviewed - No data to  display  EKG   Radiology No results found.  Procedures Procedures (including critical care time)  Medications Ordered in UC Medications - No data to display  Initial Impression / Assessment and Plan / UC Course  I have reviewed the triage vital signs and the nursing notes.  Pertinent labs & imaging results that were available during my care of the patient were reviewed by me and considered in my medical decision making (see chart for details).      Final Clinical Impressions(s) / UC Diagnoses   Final diagnoses:  Salivary duct obstruction     Discharge Instructions      Try sucking on lemon drops to increase saliva.  Warm salt water gargles 20 minutes 4 times a day   ED Prescriptions     Medication Sig Dispense Auth. Provider   cephALEXin (KEFLEX) 500 MG capsule Take 1 capsule (500 mg total) by mouth 4 (four) times daily for 7 days. 28 capsule Elson Areas, [place]      PDMP not reviewed this encounter. An After Visit Summary was printed and given to the patient.       Elson Areas, [place] 09/23/22 1511

## 2022-09-24 ENCOUNTER — Emergency Department (HOSPITAL_BASED_OUTPATIENT_CLINIC_OR_DEPARTMENT_OTHER): Payer: Medicaid Other

## 2022-09-24 ENCOUNTER — Encounter (HOSPITAL_BASED_OUTPATIENT_CLINIC_OR_DEPARTMENT_OTHER): Payer: Self-pay | Admitting: Emergency Medicine

## 2022-09-24 ENCOUNTER — Other Ambulatory Visit: Payer: Self-pay

## 2022-09-24 ENCOUNTER — Emergency Department (HOSPITAL_BASED_OUTPATIENT_CLINIC_OR_DEPARTMENT_OTHER): Payer: Medicaid Other | Admitting: Radiology

## 2022-09-24 ENCOUNTER — Emergency Department (HOSPITAL_BASED_OUTPATIENT_CLINIC_OR_DEPARTMENT_OTHER)
Admission: EM | Admit: 2022-09-24 | Discharge: 2022-09-24 | Disposition: A | Discharge status: Home / Self Care | Payer: Medicaid Other | Attending: Emergency Medicine | Admitting: Emergency Medicine

## 2022-09-24 DIAGNOSIS — R070 Pain in throat: Secondary | ICD-10-CM | POA: Diagnosis present

## 2022-09-24 DIAGNOSIS — K118 Other diseases of salivary glands: Secondary | ICD-10-CM | POA: Insufficient documentation

## 2022-09-24 DIAGNOSIS — Z91040 Latex allergy status: Secondary | ICD-10-CM | POA: Insufficient documentation

## 2022-09-24 DIAGNOSIS — Z91010 Allergy to peanuts: Secondary | ICD-10-CM | POA: Diagnosis not present

## 2022-09-24 DIAGNOSIS — M542 Cervicalgia: Secondary | ICD-10-CM | POA: Diagnosis not present

## 2022-09-24 DIAGNOSIS — J029 Acute pharyngitis, unspecified: Secondary | ICD-10-CM | POA: Diagnosis not present

## 2022-09-24 LAB — CBC WITH DIFFERENTIAL/PLATELET
Abs Immature Granulocytes: 0.03 10*3/uL (ref 0.00–0.07)
Basophils Absolute: 0 10*3/uL (ref 0.0–0.1)
Basophils Relative: 1 %
Eosinophils Absolute: 0.1 10*3/uL (ref 0.0–0.5)
Eosinophils Relative: 1 %
HCT: 38.8 % (ref 36.0–46.0)
Hemoglobin: 13.4 g/dL (ref 12.0–15.0)
Immature Granulocytes: 0 %
Lymphocytes Relative: 31 %
Lymphs Abs: 2.6 10*3/uL (ref 0.7–4.0)
MCH: 30.3 pg (ref 26.0–34.0)
MCHC: 34.5 g/dL (ref 30.0–36.0)
MCV: 87.8 fL (ref 80.0–100.0)
Monocytes Absolute: 0.6 10*3/uL (ref 0.1–1.0)
Monocytes Relative: 7 %
Neutro Abs: 5.1 10*3/uL (ref 1.7–7.7)
Neutrophils Relative %: 60 %
Platelets: 280 10*3/uL (ref 150–400)
RBC: 4.42 MIL/uL (ref 3.87–5.11)
RDW: 11.3 % — ABNORMAL LOW (ref 11.5–15.5)
WBC: 8.5 10*3/uL (ref 4.0–10.5)
nRBC: 0 % (ref 0.0–0.2)

## 2022-09-24 LAB — BASIC METABOLIC PANEL
Anion gap: 7 (ref 5–15)
BUN: 13 mg/dL (ref 6–20)
CO2: 27 mmol/L (ref 22–32)
Calcium: 9.2 mg/dL (ref 8.9–10.3)
Chloride: 104 mmol/L (ref 98–111)
Creatinine, Ser: 0.85 mg/dL (ref 0.44–1.00)
GFR, Estimated: >60 mL/min (ref >60)
Glucose, Bld: 115 mg/dL — ABNORMAL HIGH (ref 70–99)
Potassium: 3.9 mmol/L (ref 3.5–5.1)
Sodium: 138 mmol/L (ref 135–145)

## 2022-09-24 MED ORDER — IOHEXOL 300 MG/ML  SOLN
100.0000 mL | Freq: Once | INTRAMUSCULAR | Status: AC | PRN
  Administered 2022-09-24: 75 mL via INTRAVENOUS

## 2022-09-24 MED ORDER — NAPROXEN 250 MG PO TABS
500.0000 mg | ORAL_TABLET | Freq: Once | ORAL | Status: AC
  Administered 2022-09-24: 500 mg via ORAL
  Filled 2022-09-24: qty 2

## 2022-09-24 NOTE — Discharge Instructions (Addendum)
It was a pleasure caring for you today. CT not concerning for deep space infection. Seek emergency care if experiencing any new or worsening symptoms.

## 2022-09-24 NOTE — ED Provider Notes (Signed)
[place] EMERGENCY DEPARTMENT AT [facility] Provider Note   CSN: [id number] Arrival date & time: 09/24/22  1355     History  Chief Complaint  Patient presents with   Sore Throat    Sharon Townsend is a 32 y.o. female who presents to ED complaining of left-sided neck/throat pain.  Patient recently seen at urgent care, diagnosed with salivary duct obstruction, and discharged with antibiotics.  Patient stating that the swelling has gone down since taking her antibiotics; however, she feels like the swelling is progressing towards the back of her throat.  Patient has taken 3 out of 7 days of antibiotics.  Patient states that it hurts to swallow, but she is able to tolerate p.o. fluids and food.  Deneies fever, cough, choking, dyspnea, chest pain, n/d/v, vision changes   Sore Throat       Home Medications Prior to Admission medications   Medication Sig Start Date End Date Taking? Authorizing Provider  albuterol (VENTOLIN HFA) 108 (90 Base) MCG/ACT inhaler Inhale 1-2 puffs into the lungs every 6 (six) hours as needed. 07/01/22   Margaretann Loveless, PA-C  benzonatate (TESSALON) 100 MG capsule Take 1 capsule (100 mg total) by mouth 3 (three) times daily as needed. 07/01/22   Margaretann Loveless, PA-C  cephALEXin (KEFLEX) 500 MG capsule Take 1 capsule (500 mg total) by mouth 4 (four) times daily for 7 days. 09/21/22 09/28/22  Elson Areas, PA-C  doxycycline (VIBRA-TABS) 100 MG tablet Take 1 tablet (100 mg total) by mouth 2 (two) times daily. 03/04/22   Waldon Merl, PA-C  EPINEPHrine 0.3 mg/0.3 mL IJ SOAJ injection Inject 0.3 mg into the muscle as needed for anaphylaxis. 06/10/22   Theron Arista, PA-C  fluticasone (FLONASE) 50 MCG/ACT nasal spray Place 2 sprays into both nostrils daily. 10/09/21   Margaretann Loveless, PA-C  ibuprofen (ADVIL) 200 MG tablet Take 600 mg by mouth every 6 (six) hours as needed for headache or mild pain. Patient not taking: Reported on 08/04/2021     [provider]  Multiple Vitamins-Minerals (ONE-A-DAY WOMENS PO) Take 1 tablet by mouth at bedtime.    [provider]  Omega-3 Fatty Acids (FISH OIL) 1000 MG CPDR Take 1,000 mg by mouth at bedtime.    [provider]  PARoxetine (PAXIL) 20 MG tablet Take 1 tablet (20 mg total) by mouth daily. 08/12/22   Rema Fendt, NP  predniSONE (STERAPRED UNI-PAK 21 TAB) 10 MG (21) TBPK tablet 6 day taper; take as directed on package instructions 07/01/22   Margaretann Loveless, PA-C  SUMAtriptan (IMITREX) 50 MG tablet Take 50 mg (1 tablet total) by mouth at the start of the headache. May repeat in 2 hours x 1 if headache persists. Max of 2 tablets/24 hours. 08/12/22   Rema Fendt, NP  topiramate (TOPAMAX) 25 MG tablet Take one each morning and 2 in the evening. 08/12/22   Rema Fendt, NP      Allergies    Peanut oil, Latex, and Penicillins    Review of Systems   Review of Systems  HENT:  Positive for sore throat.     Physical Exam Updated Vital Signs BP 121/79   Pulse 75   Temp 98.7 F (37.1 C) (Oral)   Resp 16   Ht 5\' 1"  (1.549 m)   Wt 81.6 kg   SpO2 100%   BMI 34.01 kg/m  Physical Exam Vitals and nursing note reviewed.  Constitutional:  General: She is not in acute distress. HENT:     Head: Normocephalic and atraumatic.     Mouth/Throat:     Mouth: Mucous membranes are moist.     Pharynx: No oropharyngeal exudate or posterior oropharyngeal erythema.     Comments: No swelling, erythema, lesions or exudates appreciated of oropharynx. No palpable lymphadenopathy. No swelling appreciated of left side of face Eyes:     General: No scleral icterus.       Right eye: No discharge.        Left eye: No discharge.     Conjunctiva/sclera: Conjunctivae normal.  Cardiovascular:     Rate and Rhythm: Normal rate and regular rhythm.     Pulses: Normal pulses.     Heart sounds: Normal heart sounds. No murmur heard. Pulmonary:     Effort: Pulmonary effort is  normal.  Abdominal:     Palpations: Abdomen is soft.  Musculoskeletal:     Right lower leg: No edema.     Left lower leg: No edema.  Skin:    General: Skin is warm and dry.     Findings: No rash.  Neurological:     General: No focal deficit present.     Mental Status: She is alert. Mental status is at baseline.  Psychiatric:        Mood and Affect: Mood normal.     ED Results / Procedures / Treatments   Labs (all labs ordered are listed, but only abnormal results are displayed) Labs Reviewed  BASIC METABOLIC PANEL - Abnormal; Notable for the following components:      Result Value   Glucose, Bld 115 (*)    All other components within normal limits  CBC WITH DIFFERENTIAL/PLATELET - Abnormal; Notable for the following components:   RDW 11.3 (*)    All other components within normal limits    EKG EKG Interpretation Date/Time:  Thursday September 24 2022 14:08:04 EDT Ventricular Rate:  96 PR Interval:  148 QRS Duration:  66 QT Interval:  344 QTC Calculation: 434 R Axis:   106  Text Interpretation: Normal sinus rhythm Rightward axis Borderline ECG When compared with ECG of 10-Jun-2022 15:40, Nonspecific T wave abnormality, improved in Anterior leads No acute changes Confirmed by Derwood Kaplan [phone]) on 09/24/2022 3:04:56 PM  Radiology CT Soft Tissue Neck W Contrast  Result Date: 09/24/2022 CLINICAL DATA:  Initial evaluation for left-sided neck and throat pain, soft tissue infection. EXAM: CT NECK WITH CONTRAST TECHNIQUE: Multidetector CT imaging of the neck was performed using the standard protocol following the bolus administration of intravenous contrast. RADIATION DOSE REDUCTION: This exam was performed according to the departmental dose-optimization program which includes automated exposure control, adjustment of the mA and/or kV according to patient size and/or use of iterative reconstruction technique. CONTRAST:  75mL OMNIPAQUE IOHEXOL 300 MG/ML  SOLN COMPARISON:  None  Available. FINDINGS: Pharynx and larynx: Oral cavity within normal limits. No acute inflammatory changes seen about the dentition. Palatine tonsils symmetric and within normal limits. Question of mild mucosal edema about the left pharyngeal mucosa, which could reflect changes of mild pharyngitis (series 2, image 53). Probable subtle asymmetric hazy stranding within the adjacent left submandibular space (series 2, image 57). No retropharyngeal collection or swelling. Negative epiglottis. Mild prominence of the lingual tonsils on the left with partial effacement of the left follicular. Remainder of the hypopharynx and supraglottic larynx within normal limits. Negative glottis. Subglottic airway clear. Salivary glands: Salivary glands including the parotid and submandibular  glands are within normal limits. Thyroid: 4 mm left thyroid nodule, of doubtful significance given size and patient age, no follow-up imaging recommended (ref: J Am Coll Radiol. 2015 Feb;12(2): 143-50). Lymph nodes: Shotty subcentimeter lymph nodes seen extending along the cervical chains bilaterally. No enlarged or pathologic adenopathy seen within the neck. Vascular: Normal intravascular enhancement seen within the neck. Limited intracranial: Unremarkable. Visualized orbits: Unremarkable. Mastoids and visualized paranasal sinuses: Paranasal sinuses are largely clear. Mastoid air cells and middle ear cavities are well pneumatized and free of fluid. Skeleton: Subcentimeter sclerotic lesion noted within the T2 vertebral body, nonspecific, but likely benign. No worrisome osseous lesions. Upper chest: No acute finding. Other: None. IMPRESSION: 1. Mild mucosal edema about the left pharyngeal mucosa, with subtle asymmetric hazy stranding within the adjacent left submandibular space. Given provided history, findings are suspicious for mild changes of acute pharyngitis. No discrete abscess or drainable fluid collection. 2. No other acute abnormality within  the neck. Electronically Signed   By: Rise Mu M.D.   On: 09/24/2022 18:12   DG Ribs Unilateral W/Chest Right  Result Date: 09/24/2022 CLINICAL DATA:  Rib pain. EXAM: RIGHT RIBS AND CHEST - 3 VIEW COMPARISON:  Chest x-ray 06/22/2022 FINDINGS: No fracture or other bone lesions are seen involving the ribs. There is no evidence of pneumothorax or pleural effusion. Both lungs are clear. Heart size and mediastinal contours are within normal limits. Slight curvature of the spine. IMPRESSION: No rib fracture identified.  No pneumothorax or effusion. Electronically Signed   By: Karen Kays M.D.   On: 09/24/2022 15:10    Procedures Procedures    Medications Ordered in ED Medications  iohexol (OMNIPAQUE) 300 MG/ML solution 100 mL (75 mLs Intravenous Contrast Given 09/24/22 1750)  naproxen (NAPROSYN) tablet 500 mg (500 mg Oral Given 09/24/22 1949)    ED Course/ Medical Decision Making/ A&P                             Medical Decision Making Amount and/or Complexity of Data Reviewed Labs: ordered. Radiology: ordered.  Risk Prescription drug management.    This patient presents to the ED for concern of sore throat and throat swelling, this involves an extensive number of treatment options, and is a complaint that carries with it a high risk of complications and morbidity.  The differential diagnosis includes Flu/COVID/RSV, strep pharyngitis, tuberculosis, sinusitis, peritonsillar abscess, retropharyngeal abscess, pneumonia, meningitis.   Co morbidities that complicate the patient evaluation  None    Lab Tests:  I Ordered, and personally interpreted labs.  The pertinent results include:   CBC: No concern for anemia or leukocytosis BMP: no concern for electrolyte abnormality; no concern for kidney damage    Imaging Studies ordered:  I ordered imaging studies including  -Chest xray: patient complaining of posterior right rib pain -CT soft tissue: to assess for deep space  infection I independently visualized and interpreted imaging  I agree with the radiologist interpretation   Cardiac Monitoring: / EKG:  The patient was maintained on a cardiac monitor.  I personally viewed and interpreted the cardiac monitored which showed an underlying rhythm of: Sinus rhythm without acute ST changes or arrhythmias   Problem List / ED Course / Critical interventions / Medication management  Patient presents to ED concern for left-sided neck/throat pain.  Physical exam unremarkable.  CBC without concern for leukocytosis.  Blood labs reassuring.  Patient is afebrile with stable vitals.  CT without  concern for deep space infection.  CT showing mild mucosal edema which I believe is due to patient's resolving salivary duct obstruction.  Patient is on day 3 of 7 of her antibiotics.  It appears that her salivary duct obstruction is resolving appropriately.  I educated patient that she needs to finish entire course of antibiotics to prevent complications.  Patient endorsed understanding of plan. Patient was educated on alternating between 650 mg Tylenol and 400 mg ibuprofen every 3 hours as needed for pain. Patient was given return precautions and is stable for discharge at this time. Patient verbalized understanding of plan and stated that she was ready for discharge. I have reviewed the patients home medicines and have made adjustments as needed   DDx: These are considered less likely due to history of present illness and physical exam findings -PNA: Lungs clear to auscultation bilaterally -Meningitis: patient's symptoms, vital signs, physical exam findings including lack of meningismus seem grossly less consistent at this time -Flu/COVID/RSV: Respiratory panel negative -Tuberculosis: Patient denies night sweats, weight loss, fever, recent travel, immunocompromise -Sinusitis: Patient denies purulent sputum or facial pain   Social Determinants of  Health:  none         Final Clinical Impression(s) / ED Diagnoses Final diagnoses:  Salivary duct obstruction    Rx / DC Orders ED Discharge Orders     None         Margarita Rana 09/25/22 0025    Terrilee Files, MD 09/25/22 [phone]

## 2022-09-24 NOTE — ED Triage Notes (Signed)
Pt arrives pov, steady gait c/o LT side neck and throat pain, Recent tx at [facility] for same. Currently taking abx, not improving. Pt also reports RT side posterior rib pain upon waking today, pain with inspiration. Denies cough or injury

## 2022-10-05 ENCOUNTER — Ambulatory Visit: Payer: Medicaid Other | Admitting: Family

## 2022-10-20 NOTE — Progress Notes (Unsigned)
Patient ID: Sharon Townsend, female    DOB: [birth year]  MRN: [id number]  CC: Annual Physical Exam  Subjective: Sharon Townsend is a 32 y.o. female who presents for annual physical exam.   Her concerns today include:  Pap    Patient Active Problem List   Diagnosis Date Noted   Anaphylaxis 03/26/2021   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   Menorrhagia with regular cycle 12/18/2019   Dyspareunia in female 12/18/2019   Family history of breast cancer 12/18/2019   Family history of ovarian cancer 12/18/2019     Current Outpatient Medications on File Prior to Visit  Medication Sig Dispense Refill   albuterol (VENTOLIN HFA) 108 (90 Base) MCG/ACT inhaler Inhale 1-2 puffs into the lungs every 6 (six) hours as needed. 8 g 0   benzonatate (TESSALON) 100 MG capsule Take 1 capsule (100 mg total) by mouth 3 (three) times daily as needed. 30 capsule 0   doxycycline (VIBRA-TABS) 100 MG tablet Take 1 tablet (100 mg total) by mouth 2 (two) times daily. 20 tablet 0   EPINEPHrine 0.3 mg/0.3 mL IJ SOAJ injection Inject 0.3 mg into the muscle as needed for anaphylaxis. 1 each 0   fluticasone (FLONASE) 50 MCG/ACT nasal spray Place 2 sprays into both nostrils daily. 16 g 0   ibuprofen (ADVIL) 200 MG tablet Take 600 mg by mouth every 6 (six) hours as needed for headache or mild pain. (Patient not taking: Reported on 08/04/2021)     Multiple Vitamins-Minerals (ONE-A-DAY WOMENS PO) Take 1 tablet by mouth at bedtime.     Omega-3 Fatty Acids (FISH OIL) 1000 MG CPDR Take 1,000 mg by mouth at bedtime.     PARoxetine (PAXIL) 20 MG tablet Take 1 tablet (20 mg total) by mouth daily. 90 tablet 0   predniSONE (STERAPRED UNI-PAK 21 TAB) 10 MG (21) TBPK tablet 6 day taper; take as directed on package instructions 21 tablet 0   SUMAtriptan (IMITREX) 50 MG tablet Take 50 mg (1 tablet total) by mouth at the start of the headache. May repeat in 2 hours x 1 if headache persists. Max of 2 tablets/24  hours. 30 tablet 2   topiramate (TOPAMAX) 25 MG tablet Take one each morning and 2 in the evening. 180 tablet 2   Current Facility-Administered Medications on File Prior to Visit  Medication Dose Route Frequency Provider Last Rate Last Admin   ipratropium (ATROVENT) 0.03 % nasal spray 2 spray  2 spray Each Nare BID Ward, Tylene Fantasia, PA-C        Allergies  Allergen Reactions   Peanut Oil Anaphylaxis   Latex Swelling and Other (See Comments)   Penicillins Other (See Comments)    Reaction:  Pt states that it "shuts down her bowels and cannot urinate" Has patient had a PCN reaction causing immediate rash, facial/tongue/throat swelling, SOB or lightheadedness with hypotension: No Has patient had a PCN reaction causing severe rash involving mucus membranes or skin necrosis: No Has patient had a PCN reaction that required hospitalization No Has patient had a PCN reaction occurring within the last 10 years: No If all of the above answers are "NO", then may proceed with Cephalosp Reaction:  Pt states that it "shuts down her bowel movements". Reaction:  Pt states that it "shuts down her bowels and cannot urinate"     Social History   Socioeconomic History   Marital status: Married    Spouse name: Not on file   Number of  children: Not on file   Years of education: Not on file   Highest education level: Not on file  Occupational History   Not on file  Tobacco Use   Smoking status: Never   Smokeless tobacco: Never  Vaping Use   Vaping status: Never Used  Substance and Sexual Activity   Alcohol use: No   Drug use: No   Sexual activity: Yes    Partners: Male    Comment: ablation  Other Topics Concern   Not on file  Social History Narrative   Not on file   Social Determinants of Health   Financial Resource Strain: Not on file  Food Insecurity: Not on file  Transportation Needs: Not on file  Physical Activity: Not on file  Stress: Not on file  Social Connections: Not on file   Intimate Partner Violence: Not on file    Family History  Problem Relation Age of Onset   Diabetes Mother    Breast cancer Mother 50   Diabetes Father    Hypertension Father    Breast cancer Maternal Grandmother    Ovarian cancer Paternal Grandmother     Past Surgical History:  Procedure Laterality Date   CESAREAN SECTION N/A 06/14/2016   Procedure: CESAREAN SECTION;  Surgeon: Philip Aspen, DO;  Location: [facility];  Service: Obstetrics;  Laterality: N/A;   child birth  2016   CHOLECYSTECTOMY     DILITATION & CURRETTAGE/HYSTROSCOPY WITH NOVASURE ABLATION N/A 02/21/2020   Procedure: DILATATION & CURETTAGE, WITH NOVASURE ABLATION;  Surgeon: Adam Phenix, MD;  Location: [facility];  Service: Gynecology;  Laterality: N/A;   MANDIBLE SURGERY     renal stents     TONSILLECTOMY     TUBAL LIGATION  06/14/2016    ROS: Review of Systems Negative except as stated above  PHYSICAL EXAM: There were no vitals taken for this visit.  Physical Exam  {female adult master:310786} {female adult master:310785}     Latest Ref Rng & Units 09/24/2022    5:07 PM 03/26/2021    4:51 AM 03/25/2021   12:46 AM  CMP  Glucose 70 - 99 mg/dL 865  784  696   BUN 6 - 20 mg/dL 13  9  10    Creatinine 0.44 - 1.00 mg/dL 2.95  2.84  1.32   Sodium 135 - 145 mmol/L 138  139  136   Potassium 3.5 - 5.1 mmol/L 3.9  4.1  3.4   Chloride 98 - 111 mmol/L 104  107  106   CO2 22 - 32 mmol/L 27  21  21    Calcium 8.9 - 10.3 mg/dL 9.2  9.3  8.8    Lipid Panel     Component Value Date/Time   CHOL 173 11/11/2020 1451   TRIG 113.0 11/11/2020 1451   HDL 39.30 11/11/2020 1451   CHOLHDL 4 11/11/2020 1451   VLDL 22.6 11/11/2020 1451   LDLCALC 111 (H) 11/11/2020 1451    CBC    Component Value Date/Time   WBC 8.5 09/24/2022 1707   RBC 4.42 09/24/2022 1707   HGB 13.4 09/24/2022 1707   HCT 38.8 09/24/2022 1707   PLT 280 09/24/2022 1707   MCV 87.8 09/24/2022 1707   MCH 30.3  09/24/2022 1707   MCHC 34.5 09/24/2022 1707   RDW 11.3 (L) 09/24/2022 1707   LYMPHSABS 2.6 09/24/2022 1707   MONOABS 0.6 09/24/2022 1707   EOSABS 0.1 09/24/2022 1707   BASOSABS 0.0 09/24/2022 1707  ASSESSMENT AND PLAN:  There are no diagnoses linked to this encounter.   Patient was given the opportunity to ask questions.  Patient verbalized understanding of the plan and was able to repeat key elements of the plan. Patient was given clear instructions to go to Emergency Department or return to medical center if symptoms don't improve, worsen, or new problems develop.The patient verbalized understanding.   No orders of the defined types were placed in this encounter.    Requested Prescriptions    No prescriptions requested or ordered in this encounter    No follow-ups on file.  Rema Fendt, NP

## 2022-10-21 ENCOUNTER — Ambulatory Visit (INDEPENDENT_AMBULATORY_CARE_PROVIDER_SITE_OTHER): Payer: Medicaid Other | Admitting: Family

## 2022-10-21 ENCOUNTER — Encounter: Payer: Self-pay | Admitting: Family

## 2022-10-21 VITALS — BP 109/79 | HR 90 | Resp 12 | Ht 60.0 in

## 2022-10-21 DIAGNOSIS — Z0000 Encounter for general adult medical examination without abnormal findings: Secondary | ICD-10-CM

## 2022-10-21 DIAGNOSIS — Z131 Encounter for screening for diabetes mellitus: Secondary | ICD-10-CM

## 2022-10-21 DIAGNOSIS — Z13228 Encounter for screening for other metabolic disorders: Secondary | ICD-10-CM

## 2022-10-21 DIAGNOSIS — Z13220 Encounter for screening for lipoid disorders: Secondary | ICD-10-CM

## 2022-10-21 DIAGNOSIS — Z124 Encounter for screening for malignant neoplasm of cervix: Secondary | ICD-10-CM | POA: Diagnosis not present

## 2022-10-21 DIAGNOSIS — Z113 Encounter for screening for infections with a predominantly sexual mode of transmission: Secondary | ICD-10-CM

## 2022-10-21 DIAGNOSIS — Z1159 Encounter for screening for other viral diseases: Secondary | ICD-10-CM

## 2022-10-21 DIAGNOSIS — Z1329 Encounter for screening for other suspected endocrine disorder: Secondary | ICD-10-CM

## 2022-10-21 NOTE — Patient Instructions (Signed)

## 2022-10-21 NOTE — Progress Notes (Signed)
Pt is here for physical   Declined PAP

## 2022-10-22 ENCOUNTER — Other Ambulatory Visit: Payer: Self-pay | Admitting: Family

## 2022-10-22 ENCOUNTER — Other Ambulatory Visit (HOSPITAL_COMMUNITY)
Admission: RE | Admit: 2022-10-22 | Discharge: 2022-10-22 | Disposition: A | Discharge status: Home / Self Care | Payer: Medicaid Other | Source: Ambulatory Visit | Attending: Family | Admitting: Family

## 2022-10-22 ENCOUNTER — Other Ambulatory Visit: Payer: Self-pay

## 2022-10-22 DIAGNOSIS — Z113 Encounter for screening for infections with a predominantly sexual mode of transmission: Secondary | ICD-10-CM

## 2022-10-22 DIAGNOSIS — R7303 Prediabetes: Secondary | ICD-10-CM | POA: Insufficient documentation

## 2022-10-22 DIAGNOSIS — E785 Hyperlipidemia, unspecified: Secondary | ICD-10-CM

## 2022-10-22 LAB — HEPATIC FUNCTION PANEL
ALT: 21 IU/L (ref 0–32)
AST: 20 IU/L (ref 0–40)
Albumin: 4.5 g/dL (ref 3.9–4.9)
Alkaline Phosphatase: 81 IU/L (ref 44–121)
Bilirubin Total: 0.3 mg/dL (ref 0.0–1.2)
Bilirubin, Direct: <0.1 mg/dL (ref 0.00–0.40)
Total Protein: 7 g/dL (ref 6.0–8.5)

## 2022-10-22 LAB — LIPID PANEL
Chol/HDL Ratio: 4.8 ratio — ABNORMAL HIGH (ref 0.0–4.4)
Cholesterol, Total: 224 mg/dL — ABNORMAL HIGH (ref 100–199)
HDL: 47 mg/dL (ref >39)
LDL Chol Calc (NIH): 150 mg/dL — ABNORMAL HIGH (ref 0–99)
Triglycerides: 150 mg/dL — ABNORMAL HIGH (ref 0–149)
VLDL Cholesterol Cal: 27 mg/dL (ref 5–40)

## 2022-10-22 LAB — TSH: TSH: 0.742 u[IU]/mL (ref 0.450–4.500)

## 2022-10-22 LAB — HEPATITIS C ANTIBODY: Hep C Virus Ab: NONREACTIVE

## 2022-10-22 LAB — HEMOGLOBIN A1C: Est. average glucose Bld gHb Est-mCnc: 117 mg/dL

## 2022-10-22 MED ORDER — ATORVASTATIN CALCIUM 10 MG PO TABS
10.0000 mg | ORAL_TABLET | Freq: Every day | ORAL | 0 refills | Status: DC
Start: 2022-10-22 — End: 2023-04-07

## 2022-10-28 ENCOUNTER — Ambulatory Visit: Payer: Medicaid Other

## 2022-10-30 ENCOUNTER — Ambulatory Visit: Payer: Medicaid Other | Admitting: Family

## 2022-10-30 ENCOUNTER — Ambulatory Visit: Payer: Self-pay | Admitting: *Deleted
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
## 2022-11-03 NOTE — Telephone Encounter (Signed)
Report to Emergency Department for immediate medical evaluation/management.

## 2022-11-04 NOTE — Telephone Encounter (Signed)
Called Pt left Vm to call office back so I can relay his to her.

## 2022-11-16 NOTE — Telephone Encounter (Signed)
Goodbye

## 2022-12-01 ENCOUNTER — Encounter: Payer: Self-pay | Admitting: Emergency Medicine

## 2022-12-01 ENCOUNTER — Other Ambulatory Visit: Payer: Self-pay

## 2022-12-01 ENCOUNTER — Ambulatory Visit
Admission: EM | Admit: 2022-12-01 | Discharge: 2022-12-01 | Disposition: A | Discharge status: Home / Self Care | Payer: Medicaid Other | Attending: Internal Medicine | Admitting: Internal Medicine

## 2022-12-01 ENCOUNTER — Ambulatory Visit: Payer: Medicaid Other

## 2022-12-01 DIAGNOSIS — M79642 Pain in left hand: Secondary | ICD-10-CM

## 2022-12-01 NOTE — ED Triage Notes (Signed)
Pt here for left hand pain around thumb after slamming hand in car door approx 30 min ago

## 2022-12-01 NOTE — ED Provider Notes (Signed)
[name]    CSN: [id number] Arrival date & time: 12/01/22  1200      History   Chief Complaint Chief Complaint  Patient presents with   Hand Pain    HPI Sharon Townsend is a 32 y.o. female.   Patient presents with left hand pain that started after an injury approximately 30 minutes prior to arrival to urgent care.  Patient reports that she accidentally slammed her hand in the car door.  Has not taken anything for pain.  Reports majority of pain is in the first and second digits.   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   Menorrhagia with regular cycle 12/18/2019   Dyspareunia in female 12/18/2019   Family history of breast cancer 12/18/2019   Family history of ovarian cancer 12/18/2019    Past Surgical History:  Procedure Laterality Date   CESAREAN SECTION N/A 06/14/2016   Procedure: CESAREAN SECTION;  Surgeon: Philip Aspen, DO;  Location: [facility];  Service: Obstetrics;  Laterality: N/A;   child birth  2016   CHOLECYSTECTOMY     DILITATION & CURRETTAGE/HYSTROSCOPY WITH NOVASURE ABLATION N/A 02/21/2020   Procedure: DILATATION & CURETTAGE, WITH NOVASURE ABLATION;  Surgeon: Adam Phenix, MD;  Location: [facility];  Service: Gynecology;  Laterality: N/A;   MANDIBLE SURGERY     renal stents     TONSILLECTOMY     TUBAL LIGATION  06/14/2016    OB History     Gravida  4   Para  3   Term  1   Preterm  2   AB  1   Living  2      SAB  1   IAB  0   Ectopic  0   Multiple  0   Live Births  2            Home  Medications    Prior to Admission medications   Medication Sig Start Date End Date Taking? Authorizing Provider  albuterol (VENTOLIN HFA) 108 (90 Base) MCG/ACT inhaler Inhale 1-2 puffs into the lungs every 6 (six) hours as needed. 07/01/22   Margaretann Loveless, PA-C  atorvastatin (LIPITOR) 10 MG tablet Take 1 tablet (10 mg total) by mouth daily. 10/22/22 01/20/23  Rema Fendt, NP  benzonatate (TESSALON) 100 MG capsule Take 1 capsule (100 mg total) by mouth 3 (three) times daily as needed. 07/01/22   Margaretann Loveless, PA-C  doxycycline (VIBRA-TABS) 100 MG tablet Take 1 tablet (100 mg total) by mouth 2 (two) times daily. Patient not taking: Reported on 12/01/2022 03/04/22   Waldon Merl, PA-C  EPINEPHrine 0.3 mg/0.3 mL IJ SOAJ injection Inject 0.3 mg into the muscle as needed for anaphylaxis. 06/10/22   Theron Arista, PA-C  fluticasone (FLONASE) 50 MCG/ACT nasal spray Place 2 sprays into both nostrils daily. 10/09/21   Margaretann Loveless, PA-C  ibuprofen (ADVIL) 200 MG tablet Take 600 mg by mouth every 6 (six) hours as needed for headache or mild  pain. Patient not taking: Reported on 08/04/2021    [provider]  Multiple Vitamins-Minerals (ONE-A-DAY WOMENS PO) Take 1 tablet by mouth at bedtime.    [provider]  Omega-3 Fatty Acids (FISH OIL) 1000 MG CPDR Take 1,000 mg by mouth at bedtime.    [provider]  PARoxetine (PAXIL) 20 MG tablet Take 1 tablet (20 mg total) by mouth daily. 08/12/22   Rema Fendt, NP  predniSONE (STERAPRED UNI-PAK 21 TAB) 10 MG (21) TBPK tablet 6 day taper; take as directed on package instructions Patient not taking: Reported on 12/01/2022 07/01/22   Margaretann Loveless, PA-C  SUMAtriptan (IMITREX) 50 MG tablet Take 50 mg (1 tablet total) by mouth at the start of the headache. May repeat in 2 hours x 1 if headache persists. Max of 2 tablets/24 hours. 08/12/22   Rema Fendt, NP  topiramate (TOPAMAX) 25 MG tablet Take one each morning and  2 in the evening. 08/12/22   Rema Fendt, NP    Family History Family History  Problem Relation Age of Onset   Diabetes Mother    Breast cancer Mother 68   Diabetes Father    Hypertension Father    Breast cancer Maternal Grandmother    Ovarian cancer Paternal Grandmother     Social History Social History   Tobacco Use   Smoking status: Never   Smokeless tobacco: Never  Vaping Use   Vaping status: Never Used  Substance Use Topics   Alcohol use: No   Drug use: No     Allergies   Peanut oil, Latex, and Penicillins   Review of Systems Review of Systems Per HPI  Physical Exam Triage Vital Signs ED Triage Vitals [12/01/22 1238]  Encounter Vitals Group     BP (!) 142/87     Systolic BP Percentile      Diastolic BP Percentile      Pulse Rate 76     Resp 18     Temp 98.5 F (36.9 C)     Temp Source Oral     SpO2 98 %     Weight      Height      Head Circumference      Peak Flow      Pain Score 8     Pain Loc      Pain Education      Exclude from Growth Chart    No data found.  Updated Vital Signs BP (!) 142/87 (BP Location: Left Arm)   Pulse 76   Temp 98.5 F (36.9 C) (Oral)   Resp 18   SpO2 98%   Visual Acuity Right Eye Distance:   Left Eye Distance:   Bilateral Distance:    Right Eye Near:   Left Eye Near:    Bilateral Near:     Physical Exam Constitutional:      General: She is not in acute distress.    Appearance: Normal appearance. She is not toxic-appearing or diaphoretic.  HENT:     Head: Normocephalic and atraumatic.  Eyes:     Extraocular Movements: Extraocular movements intact.     Conjunctiva/sclera: Conjunctivae normal.  Pulmonary:     Effort: Pulmonary effort is normal.  Musculoskeletal:     Comments: Patient has tenderness to palpation throughout left thumb that extends into the dorsal surface of the hand up to the MCP joint of the second digit.  No tenderness to other parts of hand, wrist, fingers.  Mild bruising noted  directly below nail to the skin of the distal thumb.  Nail is intact with no subungual hematoma.  Full ROM of fingers present.  Appears neurovascularly intact.  Neurological:     General: No focal deficit present.     Mental Status: She is alert and oriented to person, place, and time. Mental status is at baseline.  Psychiatric:        Mood and Affect: Mood normal.        Behavior: Behavior normal.        Thought Content: Thought content normal.        Judgment: Judgment normal.      UC Treatments / Results  Labs (all labs ordered are listed, but only abnormal results are displayed) Labs Reviewed - No data to display  EKG   Radiology DG Hand Complete Left  Result Date: 12/01/2022 CLINICAL DATA:  Left hand pain, injury.  Slammed hand in car door. EXAM: LEFT HAND - COMPLETE 3+ VIEW COMPARISON:  None Available. FINDINGS: There is no evidence of fracture or dislocation. There is no evidence of arthropathy or other focal bone abnormality. Soft tissues are unremarkable. IMPRESSION: No fracture or dislocation of the left hand. Electronically Signed   By: Narda Rutherford M.D.   On: 12/01/2022 13:41    Procedures Procedures (including critical care time)  Medications Ordered in UC Medications - No data to display  Initial Impression / Assessment and Plan / UC Course  I have reviewed the triage vital signs and the nursing notes.  Pertinent labs & imaging results that were available during my care of the patient were reviewed by me and considered in my medical decision making (see chart for details).     X-ray negative for any acute bony abnormality.  Suspect contusion related to mechanism of injury.  Patient advised of supportive care including ice application and elevation.  Advised safe over-the-counter pain relievers and following up with hand specialty at provided contact if symptoms persist or worsen.  Patient verbalized understanding and was agreeable with plan. Final Clinical  Impressions(s) / UC Diagnoses   Final diagnoses:  Left hand pain     Discharge Instructions      X-ray is negative for any fracture.  Suspect bruising due to mechanism of injury.  Apply ice and elevate extremity.  Take over-the-counter pain relievers as needed.  Follow-up orthopedist if pain persists or worsens.     ED Prescriptions   None    PDMP not reviewed this encounter.   Gustavus Bryant, [place] 12/01/22 1357

## 2022-12-01 NOTE — Discharge Instructions (Addendum)
X-ray is negative for any fracture.  Suspect bruising due to mechanism of injury.  Apply ice and elevate extremity.  Take over-the-counter pain relievers as needed.  Follow-up orthopedist if pain persists or worsens.

## 2022-12-28 ENCOUNTER — Telehealth: Payer: Medicaid Other | Admitting: Nurse Practitioner

## 2022-12-28 DIAGNOSIS — B9789 Other viral agents as the cause of diseases classified elsewhere: Secondary | ICD-10-CM | POA: Diagnosis not present

## 2022-12-28 DIAGNOSIS — J329 Chronic sinusitis, unspecified: Secondary | ICD-10-CM | POA: Diagnosis not present

## 2022-12-28 MED ORDER — IPRATROPIUM BROMIDE 0.03 % NA SOLN
2.0000 | Freq: Two times a day (BID) | NASAL | 12 refills | Status: DC
Start: 2022-12-28 — End: 2023-08-25

## 2022-12-28 NOTE — Progress Notes (Signed)
E-Visit for Sinus Problems  We are sorry that you are not feeling well.  Here is how we plan to help!  Based on what you have shared with me it looks like you have sinusitis.  Sinusitis is inflammation and infection in the sinus cavities of the head.  Based on your presentation I believe you most likely have Acute Viral Sinusitis.This is an infection most likely caused by a virus. There is not specific treatment for viral sinusitis other than to help you with the symptoms until the infection runs its course.  You may use an oral decongestant such as Mucinex D or if you have glaucoma or high blood pressure use plain Mucinex. Saline nasal spray help and can safely be used as often as needed for congestion, I have prescribed: Ipratropium Bromide nasal spray 0.03% 2 sprays in eah nostril 2-3 times a day  We do not recommend antibiotics prior to 7-10 days of consistent symptoms.  Providers prescribe antibiotics to treat infections caused by bacteria. Antibiotics are very powerful in treating bacterial infections when they are used properly. To maintain their effectiveness, they should be used only when necessary. Overuse of antibiotics has resulted in the development of superbugs that are resistant to treatment!    After careful review of your answers, I would not recommend an antibiotic for your condition.  Antibiotics are not effective against viruses and therefore should not be used to treat them. Common examples of infections caused by viruses include colds and flu  Some authorities believe that zinc sprays or the use of Echinacea may shorten the course of your symptoms.  Sinus infections are not as easily transmitted as other respiratory infection, however we still recommend that you avoid close contact with loved ones, especially the very young and elderly.  Remember to wash your hands thoroughly throughout the day as this is the number one way to prevent the spread of infection!  Home Care: Only  take medications as instructed by your medical team. Do not take these medications with alcohol. A steam or ultrasonic humidifier can help congestion.  You can place a towel over your head and breathe in the steam from hot water coming from a faucet. Avoid close contacts especially the very young and the elderly. Cover your mouth when you cough or sneeze. Always remember to wash your hands.  Get Help Right Away If: You develop worsening fever or sinus pain. You develop a severe head ache or visual changes. Your symptoms persist after you have completed your treatment plan.  Make sure you Understand these instructions. Will watch your condition. Will get help right away if you are not doing well or get worse.   Thank you for choosing an e-visit.  Your e-visit answers were reviewed by a board certified advanced clinical practitioner to complete your personal care plan. Depending upon the condition, your plan could have included both over the counter or prescription medications.  Please review your pharmacy choice. Make sure the pharmacy is open so you can pick up prescription now. If there is a problem, you may contact your provider through [employer] and have the prescription routed to another pharmacy.  Your safety is important to [place]. If you have drug allergies check your prescription carefully.   For the next 24 hours you can use MyChart to ask questions about today's visit, request a non-urgent call back, or ask for a work or school excuse. You will get an email in the next two days asking about  your experience. I hope that your e-visit has been valuable and will speed your recovery.  Meds ordered this encounter  Medications   ipratropium (ATROVENT) 0.03 % nasal spray    Sig: Place 2 sprays into both nostrils every 12 (twelve) hours.    Dispense:  30 mL    Refill:  12    I spent approximately 5 minutes reviewing the patient's history, current symptoms and coordinating their  care today.

## 2023-04-02 ENCOUNTER — Other Ambulatory Visit: Payer: Self-pay | Admitting: Family

## 2023-04-02 DIAGNOSIS — F319 Bipolar disorder, unspecified: Secondary | ICD-10-CM

## 2023-04-02 DIAGNOSIS — F411 Generalized anxiety disorder: Secondary | ICD-10-CM

## 2023-04-05 ENCOUNTER — Other Ambulatory Visit: Payer: Self-pay | Admitting: Family

## 2023-04-05 DIAGNOSIS — F319 Bipolar disorder, unspecified: Secondary | ICD-10-CM

## 2023-04-05 DIAGNOSIS — F411 Generalized anxiety disorder: Secondary | ICD-10-CM

## 2023-04-05 NOTE — Telephone Encounter (Signed)
 Schedule appointment. Last office visit 10/21/2022.

## 2023-04-06 NOTE — Telephone Encounter (Signed)
 Schedule appointment. Last office visit 10/21/2022.

## 2023-04-07 ENCOUNTER — Ambulatory Visit (INDEPENDENT_AMBULATORY_CARE_PROVIDER_SITE_OTHER): Payer: PRIVATE HEALTH INSURANCE | Admitting: Family

## 2023-04-07 VITALS — BP 128/87 | HR 73 | Temp 98.0°F | Ht 60.0 in | Wt 196.2 lb

## 2023-04-07 DIAGNOSIS — F319 Bipolar disorder, unspecified: Secondary | ICD-10-CM | POA: Diagnosis not present

## 2023-04-07 DIAGNOSIS — F411 Generalized anxiety disorder: Secondary | ICD-10-CM

## 2023-04-07 DIAGNOSIS — E785 Hyperlipidemia, unspecified: Secondary | ICD-10-CM

## 2023-04-07 DIAGNOSIS — R7303 Prediabetes: Secondary | ICD-10-CM | POA: Diagnosis not present

## 2023-04-07 DIAGNOSIS — G43719 Chronic migraine without aura, intractable, without status migrainosus: Secondary | ICD-10-CM

## 2023-04-07 MED ORDER — ATORVASTATIN CALCIUM 10 MG PO TABS
10.0000 mg | ORAL_TABLET | Freq: Every day | ORAL | 0 refills | Status: DC
Start: 2023-04-07 — End: 2023-07-09

## 2023-04-07 MED ORDER — TOPIRAMATE 25 MG PO TABS
ORAL_TABLET | ORAL | 2 refills | Status: AC
Start: 2023-04-07 — End: ?

## 2023-04-07 MED ORDER — PAROXETINE HCL 20 MG PO TABS
20.0000 mg | ORAL_TABLET | Freq: Every day | ORAL | 0 refills | Status: DC
Start: 2023-04-07 — End: 2023-07-06

## 2023-04-07 NOTE — Progress Notes (Signed)
 Patient ID: Sharon Townsend, female    DOB: [birth year]  MRN: [id number]  CC: Chronic Conditions Follow-Up  Subjective: Sharon Townsend is a 33 y.o. female who presents for chronic conditions follow-up. She is accompanied by her significant other.  Her concerns today include:  - Doing well on Paroxetine , no issues/concerns. She denies thoughts of self-harm, suicidal ideations, homicidal ideations. Needs new referral to Psychiatry. - Doing well on Topiramate , no issues/concerns. - States she never began Atorvastatin . - Prediabetes.   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 8 g 0   benzonatate  (TESSALON ) 100 MG capsule Take 1 capsule (100 mg total) by mouth 3 (three) times daily as needed. 30 capsule 0   EPINEPHrine  0.3 mg/0.3 mL IJ SOAJ injection Inject 0.3 mg into the muscle as needed for anaphylaxis. 1 each 0   fluticasone  (FLONASE ) 50 MCG/ACT nasal spray Place 2 sprays into both nostrils daily. 16 g 0   Multiple Vitamins-Minerals (ONE-A-DAY WOMENS PO) Take 1 tablet by mouth at bedtime.     Omega-3 Fatty Acids (FISH OIL) 1000 MG CPDR Take 1,000 mg by mouth at bedtime.     SUMAtriptan  (IMITREX ) 50 MG tablet Take 50 mg (1 tablet total) by mouth at the start of the headache. May repeat in 2 hours x 1 if headache persists. Max of 2 tablets/24 hours. 30 tablet 2   doxycycline  (VIBRA -TABS) 100 MG tablet Take 1 tablet (100 mg total) by mouth 2 (two) times daily. (Patient not taking: Reported on  04/07/2023) 20 tablet 0   ibuprofen  (ADVIL ) 200 MG tablet Take 600 mg by mouth every 6 (six) hours as needed for headache or mild pain. (Patient not taking: Reported on 08/04/2021)     ipratropium (ATROVENT ) 0.03 % nasal spray Place 2 sprays into both nostrils every 12 (twelve) hours. (Patient not taking: Reported on 04/07/2023) 30 mL 12   predniSONE  (STERAPRED UNI-PAK 21 TAB) 10 MG (21) TBPK tablet 6 day taper; take as directed on package instructions (Patient not taking: Reported on 12/01/2022) 21 tablet 0   Current Facility-Administered Medications on File Prior to Visit  Medication Dose Route Frequency Provider Last Rate Last Admin   ipratropium (ATROVENT ) 0.03 % nasal spray 2 spray  2 spray Each Nare BID Ward, Jessica Z, PA-C        Allergies  Allergen Reactions   Peanut Oil Anaphylaxis   Latex Swelling and Other (See Comments)   Penicillins Other (See Comments)    Reaction:  Pt states that it shuts down her bowels and cannot urinate Has patient had a PCN reaction causing immediate rash, facial/tongue/throat swelling, SOB or lightheadedness with hypotension: No Has patient had a PCN reaction causing severe rash involving mucus membranes or skin necrosis: No Has patient had a PCN reaction that required hospitalization No Has patient had a PCN reaction occurring within the last 10 years: No If all of the above answers are NO, then may proceed with Cephalosp Reaction:  Pt states that it  shuts down her bowel movements. Reaction:  Pt states that it shuts down her bowels and cannot urinate     Social History   Socioeconomic History   Marital status: Married    Spouse name: Not on file   Number of children: Not on file   Years of education: Not on file   Highest education level: Bachelor's degree (e.g., BA, AB, BS)  Occupational History   Not on file  Tobacco Use   Smoking status: Never   Smokeless tobacco: Never  Vaping Use   Vaping status: Never Used  Substance and Sexual  Activity   Alcohol use: No   Drug use: No   Sexual activity: Yes    Partners: Male    Comment: ablation  Other Topics Concern   Not on file  Social History Narrative   Not on file   Social Drivers of Health   Financial Resource Strain: Low Risk  (04/06/2023)   Overall Financial Resource Strain (CARDIA)    Difficulty of Paying Living Expenses: Not hard at all  Food Insecurity: No Food Insecurity (04/06/2023)   Hunger Vital Sign    Worried About Running Out of Food in the Last Year: Never true    Ran Out of Food in the Last Year: Never true  Transportation Needs: No Transportation Needs (04/06/2023)   PRAPARE - Administrator, Civil Service (Medical): No    Lack of Transportation (Non-Medical): No  Physical Activity: Insufficiently Active (04/06/2023)   Exercise Vital Sign    Days of Exercise per Week: 3 days    Minutes of Exercise per Session: 30 min  Stress: Stress Concern Present (04/06/2023)   [employer] of Occupational Health - Occupational Stress Questionnaire    Feeling of Stress : To some extent  Social Connections: Moderately Isolated (04/06/2023)   Social Connection and Isolation Panel [NHANES]    Frequency of Communication with Friends and Family: Three times a week    Frequency of Social Gatherings with Friends and Family: Three times a week    Attends Religious Services: Never    Active Member of Clubs or Organizations: No    Attends Engineer, Structural: Not on file    Marital Status: Married  Catering Manager Violence: Not on file    Family History  Problem Relation Age of Onset   Diabetes Mother    Breast cancer Mother 62   Diabetes Father    Hypertension Father    Breast cancer Maternal Grandmother    Ovarian cancer Paternal Grandmother     Past Surgical History:  Procedure Laterality Date   CESAREAN SECTION N/A 06/14/2016   Procedure: CESAREAN SECTION;  Surgeon: Donna Just, DO;  Location: [facility];  Service:  Obstetrics;  Laterality: N/A;   child birth  2016   CHOLECYSTECTOMY     DILITATION & CURRETTAGE/HYSTROSCOPY WITH NOVASURE ABLATION N/A 02/21/2020   Procedure: DILATATION & CURETTAGE, WITH NOVASURE ABLATION;  Surgeon: Eveline Lynwood MATSU, MD;  Location: [facility];  Service: Gynecology;  Laterality: N/A;   MANDIBLE SURGERY     renal stents     TONSILLECTOMY     TUBAL LIGATION  06/14/2016    ROS: Review of Systems Negative except as stated above  PHYSICAL EXAM: BP 128/87   Pulse 73   Temp 98 F (36.7 C) (Oral)   Ht 5' (1.524 m)   Wt 196 lb 3.2 oz (89 kg)   SpO2 98%   BMI 38.32 kg/m  Physical Exam HENT:     Head: Normocephalic and atraumatic.     Nose: Nose normal.     Mouth/Throat:     Mouth: Mucous membranes are moist.     Pharynx: Oropharynx is clear.  Eyes:     Extraocular Movements: Extraocular movements intact.     Conjunctiva/sclera: Conjunctivae normal.     Pupils: Pupils are equal, round, and reactive to light.  Cardiovascular:     Rate and Rhythm: Normal rate and regular rhythm.     Pulses: Normal pulses.     Heart sounds: Normal heart sounds.  Pulmonary:     Effort: Pulmonary effort is normal.     Breath sounds: Normal breath sounds.  Musculoskeletal:        General: Normal range of motion.     Cervical back: Normal range of motion and neck supple.  Neurological:     General: No focal deficit present.     Mental Status: She is alert and oriented to person, place, and time.  Psychiatric:        Mood and Affect: Mood normal.        Behavior: Behavior normal.     ASSESSMENT AND PLAN: 1. Bipolar affective disorder, remission status unspecified (HCC) (Primary) 2. GAD (generalized anxiety disorder) - Patient denies thoughts of self-harm, suicidal ideations, homicidal ideations. - Continue Paroxetine  as prescribed. Counseled on medication adherence/adverse effects.  - Referral to Psychiatry for evaluation/management.  - Follow-up with primary  provider as scheduled.  - PARoxetine  (PAXIL ) 20 MG tablet; Take 1 tablet (20 mg total) by mouth daily.  Dispense: 90 tablet; Refill: 0 - Ambulatory referral to Psychiatry  3. Hyperlipidemia, unspecified hyperlipidemia type - Continue Atorvastatin  as prescribed. Counseled on medication adherence/adverse effects.  - Routine screening.  - Follow-up with primary provider as scheduled.  - atorvastatin  (LIPITOR) 10 MG tablet; Take 1 tablet (10 mg total) by mouth daily.  Dispense: 90 tablet; Refill: 0 - Lipid panel  4. Prediabetes - Routine screening.  - Hemoglobin A1c  5. Intractable chronic migraine without aura and without status migrainosus - Continue Topiramate  as prescribed. Counseled on medication adherence/adverse effects.  - Follow-up with primary provider in 3 months or sooner if needed.  - topiramate  (TOPAMAX ) 25 MG tablet; Take one each morning and 2 in the evening.  Dispense: 180 tablet; Refill: 2   Patient was given the opportunity to ask questions.  Patient verbalized understanding of the plan and was able to repeat key elements of the plan. Patient was given clear instructions to go to Emergency Department or return to medical center if symptoms don't improve, worsen, or new problems develop.The patient verbalized understanding.   Orders Placed This Encounter  Procedures   Hemoglobin A1c   Lipid panel   Ambulatory referral to Psychiatry     Requested Prescriptions   Signed Prescriptions Disp Refills   atorvastatin  (LIPITOR) 10 MG tablet 90 tablet 0    Sig: Take 1 tablet (10 mg total) by mouth daily.   PARoxetine  (PAXIL ) 20 MG tablet 90 tablet 0    Sig: Take 1 tablet (20 mg total) by mouth daily.   topiramate  (TOPAMAX ) 25 MG tablet 180 tablet 2    Sig: Take one each morning and 2 in the evening.    Return in about 3 months (around 07/06/2023) for Follow-Up or next available chronic conditions.  Greig JINNY Drones, NP

## 2023-04-07 NOTE — Progress Notes (Signed)
 No other concerns to discuss.

## 2023-04-08 ENCOUNTER — Other Ambulatory Visit: Payer: Self-pay | Admitting: Family

## 2023-04-08 DIAGNOSIS — E785 Hyperlipidemia, unspecified: Secondary | ICD-10-CM

## 2023-04-08 LAB — HEMOGLOBIN A1C
Est. average glucose Bld gHb Est-mCnc: 120 mg/dL
Hgb A1c MFr Bld: 5.8 % — ABNORMAL HIGH (ref 4.8–5.6)

## 2023-04-08 LAB — LIPID PANEL
Chol/HDL Ratio: 4.3 {ratio} (ref 0.0–4.4)
Cholesterol, Total: 195 mg/dL (ref 100–199)
HDL: 45 mg/dL (ref >39)
LDL Chol Calc (NIH): 113 mg/dL — ABNORMAL HIGH (ref 0–99)
Triglycerides: 214 mg/dL — ABNORMAL HIGH (ref 0–149)
VLDL Cholesterol Cal: 37 mg/dL (ref 5–40)

## 2023-04-12 NOTE — Telephone Encounter (Signed)
 Paroxetine prescribed 04/07/2023.

## 2023-04-15 ENCOUNTER — Telehealth: Payer: PRIVATE HEALTH INSURANCE | Admitting: Physician Assistant

## 2023-04-15 DIAGNOSIS — K529 Noninfective gastroenteritis and colitis, unspecified: Secondary | ICD-10-CM

## 2023-04-15 MED ORDER — ONDANSETRON 4 MG PO TBDP
4.0000 mg | ORAL_TABLET | Freq: Three times a day (TID) | ORAL | 0 refills | Status: DC | PRN
Start: 2023-04-15 — End: 2023-08-25

## 2023-04-15 NOTE — Progress Notes (Signed)
 E-Visit for Diarrhea  We are sorry that you are not feeling well.  Here is how we plan to help!  Based on what you have shared with me it looks like you have Acute Infectious Diarrhea.  Most cases of acute diarrhea are due to infections with virus and bacteria and are self-limited conditions lasting less than 14 days.  For your symptoms you may take Imodium 2 mg tablets that are over the counter at your local pharmacy. Take two tablet now and then one after each loose stool up to 6 a day.  Antibiotics are not needed for most people with diarrhea.  I have prescribed Zofran 4 mg 1 tablet every 8 hours as needed for nausea and vomiting  HOME CARE We recommend changing your diet to help with your symptoms for the next few days. Drink plenty of fluids that contain water salt and sugar. Sports drinks such as Gatorade may help.  You may try broths, soups, bananas, applesauce, soft breads, mashed potatoes or crackers.  You are considered infectious for as long as the diarrhea continues. Hand washing or use of alcohol based hand sanitizers is recommend. It is best to stay out of work or school until your symptoms stop.   GET HELP RIGHT AWAY If you have dark yellow colored urine or do not pass urine frequently you should drink more fluids.   If your symptoms worsen  If you feel like you are going to pass out (faint) You have a new problem  MAKE SURE YOU  Understand these instructions. Will watch your condition. Will get help right away if you are not doing well or get worse.  Thank you for choosing an e-visit.  Your e-visit answers were reviewed by a board certified advanced clinical practitioner to complete your personal care plan. Depending upon the condition, your plan could have included both over the counter or prescription medications.  Please review your pharmacy choice. Make sure the pharmacy is open so you can pick up prescription now. If there is a problem, you may contact your  provider through [employer] and have the prescription routed to another pharmacy.  Your safety is important to [place]. If you have drug allergies check your prescription carefully.   For the next 24 hours you can use MyChart to ask questions about today's visit, request a non-urgent call back, or ask for a work or school excuse. You will get an email in the next two days asking about your experience. I hope that your e-visit has been valuable and will speed your recovery.

## 2023-04-15 NOTE — Progress Notes (Signed)
I have spent 5 minutes in review of e-visit questionnaire, review and updating patient chart, medical decision making and response to patient.   Mia Milan Cody Jacklynn Dehaas, PA-C    

## 2023-05-10 ENCOUNTER — Ambulatory Visit
Admission: EM | Admit: 2023-05-10 | Discharge: 2023-05-10 | Disposition: A | Discharge status: Home / Self Care | Payer: PRIVATE HEALTH INSURANCE

## 2023-05-10 DIAGNOSIS — Z955 Presence of coronary angioplasty implant and graft: Secondary | ICD-10-CM | POA: Insufficient documentation

## 2023-05-10 DIAGNOSIS — M25512 Pain in left shoulder: Secondary | ICD-10-CM | POA: Diagnosis not present

## 2023-05-10 DIAGNOSIS — T7840XA Allergy, unspecified, initial encounter: Secondary | ICD-10-CM

## 2023-05-10 DIAGNOSIS — D649 Anemia, unspecified: Secondary | ICD-10-CM | POA: Insufficient documentation

## 2023-05-10 DIAGNOSIS — Z8759 Personal history of other complications of pregnancy, childbirth and the puerperium: Secondary | ICD-10-CM | POA: Insufficient documentation

## 2023-05-10 DIAGNOSIS — Z8279 Family history of other congenital malformations, deformations and chromosomal abnormalities: Secondary | ICD-10-CM | POA: Insufficient documentation

## 2023-05-10 DIAGNOSIS — Z6741 Type O blood, Rh negative: Secondary | ICD-10-CM | POA: Insufficient documentation

## 2023-05-10 DIAGNOSIS — O039 Complete or unspecified spontaneous abortion without complication: Secondary | ICD-10-CM | POA: Insufficient documentation

## 2023-05-10 MED ORDER — METHYLPREDNISOLONE ACETATE 80 MG/ML IJ SUSP
80.0000 mg | Freq: Once | INTRAMUSCULAR | Status: AC
  Administered 2023-05-10: 80 mg via INTRAMUSCULAR

## 2023-05-10 MED ORDER — DIPHENHYDRAMINE HCL 50 MG/ML IJ SOLN
50.0000 mg | Freq: Once | INTRAMUSCULAR | Status: AC
  Administered 2023-05-10: 50 mg via INTRAMUSCULAR
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
## 2023-05-10 NOTE — ED Notes (Signed)
 Vital signs stable.
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
## 2023-07-06 ENCOUNTER — Ambulatory Visit (INDEPENDENT_AMBULATORY_CARE_PROVIDER_SITE_OTHER): Payer: PRIVATE HEALTH INSURANCE | Admitting: Family

## 2023-07-06 ENCOUNTER — Encounter: Payer: Self-pay | Admitting: Family

## 2023-07-06 VITALS — BP 117/86 | HR 81 | Temp 98.3°F | Resp 16 | Ht 60.0 in | Wt 195.0 lb

## 2023-07-06 DIAGNOSIS — E785 Hyperlipidemia, unspecified: Secondary | ICD-10-CM

## 2023-07-06 DIAGNOSIS — F319 Bipolar disorder, unspecified: Secondary | ICD-10-CM | POA: Diagnosis not present

## 2023-07-06 DIAGNOSIS — Z7689 Persons encountering health services in other specified circumstances: Secondary | ICD-10-CM

## 2023-07-06 DIAGNOSIS — F411 Generalized anxiety disorder: Secondary | ICD-10-CM | POA: Diagnosis not present

## 2023-07-06 DIAGNOSIS — R635 Abnormal weight gain: Secondary | ICD-10-CM

## 2023-07-06 DIAGNOSIS — G43719 Chronic migraine without aura, intractable, without status migrainosus: Secondary | ICD-10-CM

## 2023-07-06 DIAGNOSIS — Z6838 Body mass index (BMI) 38.0-38.9, adult: Secondary | ICD-10-CM

## 2023-07-06 MED ORDER — TOPIRAMATE 25 MG PO TABS
ORAL_TABLET | ORAL | 2 refills | Status: DC
Start: 2023-07-06 — End: 2023-08-25

## 2023-07-06 MED ORDER — PHENTERMINE HCL 15 MG PO CAPS
15.0000 mg | ORAL_CAPSULE | ORAL | 0 refills | Status: DC
Start: 2023-07-06 — End: 2023-08-05

## 2023-07-06 MED ORDER — PAROXETINE HCL 20 MG PO TABS
20.0000 mg | ORAL_TABLET | Freq: Every day | ORAL | 0 refills | Status: AC
Start: 2023-07-06 — End: ?

## 2023-07-06 NOTE — Progress Notes (Unsigned)
 3 month.  No concerns

## 2023-07-06 NOTE — Progress Notes (Unsigned)
 Patient ID: Sharon Townsend, female    DOB: Aug 09, 1990  MRN: [id number]  CC: Follow-up   Subjective: Sharon Townsend is a 33 y.o. female who presents for Her concerns today include: ***  Patient Active Problem List   Diagnosis Date Noted   Anemia 05/10/2023   Family history of cleft lip 05/10/2023   Family history of congenital heart disease 05/10/2023   History of gestational hypertension 05/10/2023   Stented coronary artery 05/10/2023   Fetal demise due to miscarriage 05/10/2023   Type O blood, Rh negative 05/10/2023   History of stillbirth 05/10/2023   Prediabetes 10/22/2022   Anaphylaxis 03/26/2021   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   Menorrhagia with regular cycle 12/18/2019   Dyspareunia in female 12/18/2019   Family history of breast cancer 12/18/2019   Family history of ovarian cancer 12/18/2019   Pain in pelvis 07/15/2018   History of pre-eclampsia 07/15/2016   Pregnancy 11/18/2015     Current Outpatient Medications on File Prior to Visit  Medication Sig Dispense Refill   Acetaminophen (TYLENOL 8 HOUR PO)      albuterol (VENTOLIN HFA) 108 (90 Base) MCG/ACT inhaler Inhale 1-2 puffs into the lungs every 6 (six) hours as needed. 8 g 0   atorvastatin (LIPITOR) 10 MG tablet Take 1 tablet (10 mg total) by mouth daily. 90 tablet 0   benzonatate (TESSALON) 100 MG capsule Take 1 capsule (100 mg total) by mouth 3 (three) times daily as needed. 30 capsule 0   EPINEPHrine 0.3 mg/0.3 mL IJ SOAJ injection Inject 0.3 mg into the muscle as needed for anaphylaxis. 1 each 0   Ferrous Gluconate (IRON 27 PO) daily.     fluticasone (FLONASE) 50 MCG/ACT nasal spray Place 2 sprays into both nostrils daily. 16 g 0   ibuprofen (ADVIL) 600 MG tablet Take 1 tablet by mouth every 6 (six) hours.     meloxicam (MOBIC) 15 MG tablet Take 15 mg by mouth daily.     Multiple Vitamins-Minerals (ONE-A-DAY WOMENS PO) Take 1 tablet by mouth at bedtime.     Omega-3 Fatty  Acids (FISH OIL) 1000 MG CPDR Take 1,000 mg by mouth at bedtime.     ondansetron (ZOFRAN-ODT) 4 MG disintegrating tablet Take 1 tablet (4 mg total) by mouth every 8 (eight) hours as needed for nausea or vomiting. 20 tablet 0   PARoxetine (PAXIL) 20 MG tablet Take 1 tablet (20 mg total) by mouth daily. 90 tablet 0   SUMAtriptan (IMITREX) 50 MG tablet Take 50 mg (1 tablet total) by mouth at the start of the headache. May repeat in 2 hours x 1 if headache persists. Max of 2 tablets/24 hours. 30 tablet 2   topiramate (TOPAMAX) 25 MG tablet Take one each morning and 2 in the evening. 180 tablet 2   doxycycline (VIBRA-TABS) 100 MG tablet Take 1 tablet (100 mg total) by mouth 2 (two) times daily. (Patient not taking: Reported on 12/01/2022) 20 tablet 0   ibuprofen (ADVIL) 200 MG tablet Take 600 mg by mouth every 6 (six) hours as needed for headache or mild pain. (Patient not taking: Reported on 08/04/2021)     ipratropium (ATROVENT) 0.03 % nasal spray Place 2 sprays into both nostrils every 12 (twelve) hours. (Patient not taking: Reported on 04/07/2023) 30 mL 12   PARoxetine (PAXIL) 20 MG tablet Take 20 mg by mouth daily. (Patient not taking: Reported on 07/06/2023)     predniSONE (STERAPRED UNI-PAK 21 TAB) 10  MG (21) TBPK tablet 6 day taper; take as directed on package instructions (Patient not taking: Reported on 12/01/2022) 21 tablet 0   SUMAtriptan (IMITREX) 50 MG tablet Take 50 mg by mouth every 2 (two) hours as needed. (Patient not taking: Reported on 07/06/2023)     topiramate (TOPAMAX) 25 MG tablet Take 25 mg by mouth 2 (two) times daily. (Patient not taking: Reported on 07/06/2023)     Current Facility-Administered Medications on File Prior to Visit  Medication Dose Route Frequency Provider Last Rate Last Admin   ipratropium (ATROVENT) 0.03 % nasal spray 2 spray  2 spray Each Nare BID Ward, Tylene Fantasia, PA-C        Allergies  Allergen Reactions   Peanut Allergen Powder-Dnfp Anaphylaxis   Peanut Oil  Anaphylaxis   Latex Other (See Comments) and Swelling    Other Reaction(s): edema   Penicillins Other (See Comments)    Reaction:  Pt states that it "shuts down her bowels and cannot urinate"  Has patient had a PCN reaction causing immediate rash, facial/tongue/throat swelling, SOB or lightheadedness with hypotension: No  Has patient had a PCN reaction causing severe rash involving mucus membranes or skin necrosis: No  Has patient had a PCN reaction that required hospitalization No  Has patient had a PCN reaction occurring within the last 10 years: No  If all of the above answers are "NO", then may proceed with Cephalosp  Reaction:  Pt states that it "shuts down her bowel movements".  Other Reaction(s): Other (See Comments)  Other Reaction(s): other    Social History   Socioeconomic History   Marital status: Married    Spouse name: Not on file   Number of children: Not on file   Years of education: Not on file   Highest education level: Bachelor's degree (e.g., BA, AB, BS)  Occupational History   Not on file  Tobacco Use   Smoking status: Never   Smokeless tobacco: Never  Vaping Use   Vaping status: Never Used  Substance and Sexual Activity   Alcohol use: No   Drug use: No   Sexual activity: Yes    Partners: Male    Comment: ablation  Other Topics Concern   Not on file  Social History Narrative   Not on file   Social Drivers of Health   Financial Resource Strain: Low Risk  (04/06/2023)   Overall Financial Resource Strain (CARDIA)    Difficulty of Paying Living Expenses: Not hard at all  Food Insecurity: No Food Insecurity (04/06/2023)   Hunger Vital Sign    Worried About Running Out of Food in the Last Year: Never true    Ran Out of Food in the Last Year: Never true  Transportation Needs: No Transportation Needs (04/06/2023)   PRAPARE - Administrator, Civil Service (Medical): No    Lack of Transportation (Non-Medical): No  Physical Activity:  Insufficiently Active (04/06/2023)   Exercise Vital Sign    Days of Exercise per Week: 3 days    Minutes of Exercise per Session: 30 min  Stress: Stress Concern Present (04/06/2023)   [employer] of Occupational Health - Occupational Stress Questionnaire    Feeling of Stress : To some extent  Social Connections: Moderately Isolated (04/06/2023)   Social Connection and Isolation Panel [NHANES]    Frequency of Communication with Friends and Family: Three times a week    Frequency of Social Gatherings with Friends and Family: Three times a week  Attends Religious Services: Never    Active Member of Clubs or Organizations: No    Attends Engineer, structural: Not on file    Marital Status: Married  Catering manager Violence: Not on file    Family History  Problem Relation Age of Onset   Diabetes Mother    Breast cancer Mother 61   Diabetes Father    Hypertension Father    Breast cancer Maternal Grandmother    Ovarian cancer Paternal Grandmother     Past Surgical History:  Procedure Laterality Date   CESAREAN SECTION N/A 06/14/2016   Procedure: CESAREAN SECTION;  Surgeon: Philip Aspen, DO;  Location: [facility];  Service: Obstetrics;  Laterality: N/A;   child birth  2016   CHOLECYSTECTOMY     DILITATION & CURRETTAGE/HYSTROSCOPY WITH NOVASURE ABLATION N/A 02/21/2020   Procedure: DILATATION & CURETTAGE, WITH NOVASURE ABLATION;  Surgeon: Adam Phenix, MD;  Location: [facility];  Service: Gynecology;  Laterality: N/A;   MANDIBLE SURGERY     renal stents     TONSILLECTOMY     TUBAL LIGATION  06/14/2016    ROS: Review of Systems Negative except as stated above  PHYSICAL EXAM: BP 117/86   Pulse 81   Temp 98.3 F (36.8 C) (Oral)   Resp 16   Ht 5' (1.524 m)   Wt 195 lb (88.5 kg)   LMP 01/21/2018 (Exact Date)   SpO2 98%   BMI 38.08 kg/m   Physical Exam  {female adult master:310786} {female adult master:310785}     Latest Ref Rng &  Units 10/21/2022   10:41 AM 09/24/2022    5:07 PM 03/26/2021    4:51 AM  CMP  Glucose 70 - 99 mg/dL  409  811   BUN 6 - 20 mg/dL  13  9   Creatinine 9.14 - 1.00 mg/dL  7.82  9.56   Sodium 213 - 145 mmol/L  138  139   Potassium 3.5 - 5.1 mmol/L  3.9  4.1   Chloride 98 - 111 mmol/L  104  107   CO2 22 - 32 mmol/L  27  21   Calcium 8.9 - 10.3 mg/dL  9.2  9.3   Total Protein 6.0 - 8.5 g/dL 7.0     Total Bilirubin 0.0 - 1.2 mg/dL 0.3     Alkaline Phos 44 - 121 IU/L 81     AST 0 - 40 IU/L 20     ALT 0 - 32 IU/L 21      Lipid Panel     Component Value Date/Time   CHOL 195 04/07/2023 1131   TRIG 214 (H) 04/07/2023 1131   HDL 45 04/07/2023 1131   CHOLHDL 4.3 04/07/2023 1131   CHOLHDL 4 11/11/2020 1451   VLDL 22.6 11/11/2020 1451   LDLCALC 113 (H) 04/07/2023 1131    CBC    Component Value Date/Time   WBC 8.5 09/24/2022 1707   RBC 4.42 09/24/2022 1707   HGB 13.4 09/24/2022 1707   HCT 38.8 09/24/2022 1707   PLT 280 09/24/2022 1707   MCV 87.8 09/24/2022 1707   MCH 30.3 09/24/2022 1707   MCHC 34.5 09/24/2022 1707   RDW 11.3 (L) 09/24/2022 1707   LYMPHSABS 2.6 09/24/2022 1707   MONOABS 0.6 09/24/2022 1707   EOSABS 0.1 09/24/2022 1707   BASOSABS 0.0 09/24/2022 1707    ASSESSMENT AND PLAN:  There are no diagnoses linked to this encounter.   Patient was given the opportunity  to ask questions.  Patient verbalized understanding of the plan and was able to repeat key elements of the plan. Patient was given clear instructions to go to Emergency Department or return to medical center if symptoms don't improve, worsen, or new problems develop.The patient verbalized understanding.   No orders of the defined types were placed in this encounter.    Requested Prescriptions    No prescriptions requested or ordered in this encounter    No follow-ups on file.  Rema Fendt, NP

## 2023-07-08 ENCOUNTER — Other Ambulatory Visit: Payer: Self-pay | Admitting: Family

## 2023-07-08 DIAGNOSIS — E785 Hyperlipidemia, unspecified: Secondary | ICD-10-CM

## 2023-07-08 LAB — LIPID PANEL
Chol/HDL Ratio: 5 ratio — ABNORMAL HIGH (ref 0.0–4.4)
Cholesterol, Total: 210 mg/dL — ABNORMAL HIGH (ref 100–199)
HDL: 42 mg/dL (ref >39)
LDL Chol Calc (NIH): 141 mg/dL — ABNORMAL HIGH (ref 0–99)
Triglycerides: 147 mg/dL (ref 0–149)
VLDL Cholesterol Cal: 27 mg/dL (ref 5–40)

## 2023-07-08 LAB — SPECIMEN STATUS REPORT

## 2023-07-08 NOTE — Telephone Encounter (Signed)
 noted

## 2023-07-09 ENCOUNTER — Other Ambulatory Visit: Payer: Self-pay | Admitting: Family

## 2023-07-09 ENCOUNTER — Encounter: Payer: Self-pay | Admitting: Family

## 2023-07-09 DIAGNOSIS — E785 Hyperlipidemia, unspecified: Secondary | ICD-10-CM

## 2023-07-09 MED ORDER — ATORVASTATIN CALCIUM 10 MG PO TABS
10.0000 mg | ORAL_TABLET | Freq: Every day | ORAL | 0 refills | Status: DC
Start: 2023-07-09 — End: 2023-08-05

## 2023-07-16 NOTE — Progress Notes (Signed)
 A video visit link was sent through Epic, but the patient did not sign in. Attempts to call for today's appointment were unsuccessful. She had previously been advised to log in using the provided link.

## 2023-07-17 ENCOUNTER — Ambulatory Visit (HOSPITAL_BASED_OUTPATIENT_CLINIC_OR_DEPARTMENT_OTHER): Payer: Self-pay | Admitting: Psychiatry

## 2023-07-17 ENCOUNTER — Encounter (HOSPITAL_COMMUNITY): Payer: Self-pay

## 2023-07-17 DIAGNOSIS — Z91199 Patient's noncompliance with other medical treatment and regimen due to unspecified reason: Secondary | ICD-10-CM

## 2023-08-05 ENCOUNTER — Ambulatory Visit (INDEPENDENT_AMBULATORY_CARE_PROVIDER_SITE_OTHER): Payer: PRIVATE HEALTH INSURANCE | Admitting: Family

## 2023-08-05 ENCOUNTER — Encounter: Payer: Self-pay | Admitting: Family

## 2023-08-05 VITALS — BP 112/78 | HR 70 | Temp 98.2°F | Resp 16 | Ht 60.0 in | Wt 182.8 lb

## 2023-08-05 DIAGNOSIS — E785 Hyperlipidemia, unspecified: Secondary | ICD-10-CM

## 2023-08-05 DIAGNOSIS — R635 Abnormal weight gain: Secondary | ICD-10-CM

## 2023-08-05 DIAGNOSIS — Z7689 Persons encountering health services in other specified circumstances: Secondary | ICD-10-CM

## 2023-08-05 DIAGNOSIS — Z6835 Body mass index (BMI) 35.0-35.9, adult: Secondary | ICD-10-CM | POA: Diagnosis not present

## 2023-08-05 MED ORDER — ATORVASTATIN CALCIUM 10 MG PO TABS: 10.0000 mg | ORAL_TABLET | Freq: Every day | ORAL | 0 refills | Status: AC

## 2023-08-05 MED ORDER — PHENTERMINE HCL 30 MG PO CAPS: 30.0000 mg | ORAL_CAPSULE | ORAL | 0 refills | Status: DC

## 2023-08-05 NOTE — Progress Notes (Signed)
 Patient ID: Sharon Townsend, female    DOB: July 14, 1990  MRN: [id number]  CC: Chronic Conditions Follow-Up  Subjective: Sharon Townsend is a 33 y.o. female who presents for chronic conditions follow-up. She is accompanied by her significant other.  Her concerns today include:  - Doing well on Phentermine , no issues/concerns.  - Doing well on Atorvastatin , no issues/concerns. - Reports upcoming appointment with Psychiatry. She denies thoughts of self-harm, suicidal ideations, homicidal ideations.   Patient Active Problem List   Diagnosis Date Noted   Anemia 05/10/2023   Family history of cleft lip 05/10/2023   Family history of congenital heart disease 05/10/2023   History of gestational hypertension 05/10/2023   Stented coronary artery 05/10/2023   Fetal demise due to miscarriage 05/10/2023   Type O blood, Rh negative 05/10/2023   History of stillbirth 05/10/2023   Prediabetes 10/22/2022   Anaphylaxis 03/26/2021   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   Menorrhagia with regular cycle 12/18/2019   Dyspareunia in female 12/18/2019   Family history of breast cancer 12/18/2019   Family history of ovarian cancer 12/18/2019   Pain in pelvis 07/15/2018   History of pre-eclampsia 07/15/2016   Pregnancy 11/18/2015     Current Outpatient Medications on File Prior to Visit  Medication Sig Dispense Refill   Acetaminophen  (TYLENOL  8 HOUR PO)      albuterol  (VENTOLIN  HFA) 108 (90 Base) MCG/ACT inhaler Inhale 1-2 puffs into the lungs every 6 (six) hours as needed. 8 g 0   benzonatate  (TESSALON ) 100 MG capsule Take 1 capsule (100 mg total) by mouth 3 (three) times daily as needed. 30 capsule 0   EPINEPHrine  0.3 mg/0.3 mL IJ SOAJ injection Inject 0.3 mg into the muscle as needed for anaphylaxis. 1 each 0   Ferrous Gluconate (IRON 27 PO) daily.     fluticasone  (FLONASE ) 50 MCG/ACT nasal spray Place 2 sprays into both nostrils daily. 16 g 0   ibuprofen  (ADVIL ) 600 MG  tablet Take 1 tablet by mouth every 6 (six) hours.     meloxicam (MOBIC) 15 MG tablet Take 15 mg by mouth daily.     Multiple Vitamins-Minerals (ONE-A-DAY WOMENS PO) Take 1 tablet by mouth at bedtime.     Omega-3 Fatty Acids (FISH OIL) 1000 MG CPDR Take 1,000 mg by mouth at bedtime.     ondansetron  (ZOFRAN -ODT) 4 MG disintegrating tablet Take 1 tablet (4 mg total) by mouth every 8 (eight) hours as needed for nausea or vomiting. 20 tablet 0   PARoxetine  (PAXIL ) 20 MG tablet Take 1 tablet (20 mg total) by mouth daily. 90 tablet 0   SUMAtriptan  (IMITREX ) 50 MG tablet Take 50 mg (1 tablet total) by mouth at the start of the headache. May repeat in 2 hours x 1 if headache persists. Max of 2 tablets/24 hours. 30 tablet 2   topiramate  (TOPAMAX ) 25 MG tablet Take one each morning and 2 in the evening. 180 tablet 2   doxycycline  (VIBRA -TABS) 100 MG tablet Take 1 tablet (100 mg total) by mouth 2 (two) times daily. (Patient not taking: Reported on 12/01/2022) 20 tablet 0   ibuprofen  (ADVIL ) 200 MG tablet Take 600 mg by mouth every 6 (six) hours as needed for headache or mild pain. (Patient not taking: Reported on 08/04/2021)     ipratropium (ATROVENT ) 0.03 % nasal spray Place 2 sprays into both nostrils every 12 (twelve) hours. (Patient not taking: Reported on 04/07/2023) 30 mL 12   PARoxetine  (PAXIL ) 20  MG tablet Take 20 mg by mouth daily. (Patient not taking: Reported on 07/06/2023)     predniSONE  (STERAPRED UNI-PAK 21 TAB) 10 MG (21) TBPK tablet 6 day taper; take as directed on package instructions (Patient not taking: Reported on 12/01/2022) 21 tablet 0   SUMAtriptan  (IMITREX ) 50 MG tablet Take 50 mg by mouth every 2 (two) hours as needed. (Patient not taking: Reported on 07/06/2023)     topiramate  (TOPAMAX ) 25 MG tablet Take 25 mg (1 tablet total) by mouth at the start of the headache. May repeat in 2 hours x 1 if headache persists. Max of 2 tablets/24 hours. (Patient not taking: Reported on 08/05/2023) 30 tablet 2    Current Facility-Administered Medications on File Prior to Visit  Medication Dose Route Frequency Provider Last Rate Last Admin   ipratropium (ATROVENT ) 0.03 % nasal spray 2 spray  2 spray Each Nare BID Ward, Jessica Z, PA-C        Allergies  Allergen Reactions   Peanut Allergen Powder-Dnfp Anaphylaxis   Peanut Oil Anaphylaxis   Latex Other (See Comments) and Swelling    Other Reaction(s): edema   Penicillins Other (See Comments)    Reaction:  Pt states that it "shuts down her bowels and cannot urinate"  Has patient had a PCN reaction causing immediate rash, facial/tongue/throat swelling, SOB or lightheadedness with hypotension: No  Has patient had a PCN reaction causing severe rash involving mucus membranes or skin necrosis: No  Has patient had a PCN reaction that required hospitalization No  Has patient had a PCN reaction occurring within the last 10 years: No  If all of the above answers are "NO", then may proceed with Cephalosp  Reaction:  Pt states that it "shuts down her bowel movements".  Other Reaction(s): Other (See Comments)  Other Reaction(s): other    Social History   Socioeconomic History   Marital status: Married    Spouse name: Not on file   Number of children: Not on file   Years of education: Not on file   Highest education level: Bachelor's degree (e.g., BA, AB, BS)  Occupational History   Not on file  Tobacco Use   Smoking status: Never   Smokeless tobacco: Never  Vaping Use   Vaping status: Never Used  Substance and Sexual Activity   Alcohol use: No   Drug use: No   Sexual activity: Yes    Partners: Male    Comment: ablation  Other Topics Concern   Not on file  Social History Narrative   Not on file   Social Drivers of Health   Financial Resource Strain: Low Risk  (04/06/2023)   Overall Financial Resource Strain (CARDIA)    Difficulty of Paying Living Expenses: Not hard at all  Food Insecurity: No Food Insecurity (04/06/2023)   Hunger  Vital Sign    Worried About Running Out of Food in the Last Year: Never true    Ran Out of Food in the Last Year: Never true  Transportation Needs: No Transportation Needs (04/06/2023)   PRAPARE - Administrator, Civil Service (Medical): No    Lack of Transportation (Non-Medical): No  Physical Activity: Insufficiently Active (04/06/2023)   Exercise Vital Sign    Days of Exercise per Week: 3 days    Minutes of Exercise per Session: 30 min  Stress: Stress Concern Present (04/06/2023)   [employer] of Occupational Health - Occupational Stress Questionnaire    Feeling of Stress : To some extent  Social Connections: Moderately Isolated (04/06/2023)   Social Connection and Isolation Panel [NHANES]    Frequency of Communication with Friends and Family: Three times a week    Frequency of Social Gatherings with Friends and Family: Three times a week    Attends Religious Services: Never    Active Member of Clubs or Organizations: No    Attends Engineer, structural: Not on file    Marital Status: Married  Catering manager Violence: Not on file    Family History  Problem Relation Age of Onset   Diabetes Mother    Breast cancer Mother 63   Diabetes Father    Hypertension Father    Breast cancer Maternal Grandmother    Ovarian cancer Paternal Grandmother     Past Surgical History:  Procedure Laterality Date   CESAREAN SECTION N/A 06/14/2016   Procedure: CESAREAN SECTION;  Surgeon: Atlas Blank, DO;  Location: [facility];  Service: Obstetrics;  Laterality: N/A;   child birth  2016   CHOLECYSTECTOMY     DILITATION & CURRETTAGE/HYSTROSCOPY WITH NOVASURE ABLATION N/A 02/21/2020   Procedure: DILATATION & CURETTAGE, WITH NOVASURE ABLATION;  Surgeon: Tresia Fruit, MD;  [facility];  Service: Gynecology;  Laterality: N/A;   MANDIBLE SURGERY     renal stents     TONSILLECTOMY     TUBAL LIGATION  06/14/2016    ROS: Review of  Systems Negative except as stated above  PHYSICAL EXAM: BP 112/78   Pulse 70   Temp 98.2 F (36.8 C) (Oral)   Resp 16   Ht 5' (1.524 m)   Wt 182 lb 12.8 oz (82.9 kg)   LMP 01/21/2018 (Exact Date)   SpO2 98%   BMI 35.70 kg/m   Physical Exam HENT:     Head: Normocephalic and atraumatic.     Nose: Nose normal.     Mouth/Throat:     Mouth: Mucous membranes are moist.     Pharynx: Oropharynx is clear.  Eyes:     Extraocular Movements: Extraocular movements intact.     Conjunctiva/sclera: Conjunctivae normal.     Pupils: Pupils are equal, round, and reactive to light.  Cardiovascular:     Rate and Rhythm: Normal rate and regular rhythm.     Pulses: Normal pulses.     Heart sounds: Normal heart sounds.  Pulmonary:     Effort: Pulmonary effort is normal.     Breath sounds: Normal breath sounds.  Musculoskeletal:        General: Normal range of motion.     Cervical back: Normal range of motion and neck supple.  Neurological:     General: No focal deficit present.     Mental Status: She is alert and oriented to person, place, and time.  Psychiatric:        Mood and Affect: Mood normal.        Behavior: Behavior normal.     ASSESSMENT AND PLAN: 1. Encounter for weight management (Primary) 2. BMI 35.0-35.9,adult 3. Weight gain - Increase Phentermine  from 15 mg to 30 mg as prescribed. Counseled on medication adherence/adverse effects.  - Follow-up with primary provider in 4 weeks or sooner if needed. - phentermine  30 MG capsule; Take 1 capsule (30 mg total) by mouth every morning.  Dispense: 30 capsule; Refill: 0  4. Hyperlipidemia, unspecified hyperlipidemia type - Continue Atorvastatin  as prescribed. Counseled on medication adherence/adverse effects. - Routine screening.  - Follow-up with primary provider as scheduled. - Lipid  panel - atorvastatin  (LIPITOR) 10 MG tablet; Take 1 tablet (10 mg total) by mouth daily.  Dispense: 90 tablet; Refill: 0    Patient was  given the opportunity to ask questions.  Patient verbalized understanding of the plan and was able to repeat key elements of the plan. Patient was given clear instructions to go to Emergency Department or return to medical center if symptoms don't improve, worsen, or new problems develop.The patient verbalized understanding.   Orders Placed This Encounter  Procedures   Lipid panel     Requested Prescriptions   Signed Prescriptions Disp Refills   phentermine  30 MG capsule 30 capsule 0    Sig: Take 1 capsule (30 mg total) by mouth every morning.   atorvastatin  (LIPITOR) 10 MG tablet 90 tablet 0    Sig: Take 1 tablet (10 mg total) by mouth daily.    Return in about 4 weeks (around 09/02/2023) for Follow-Up or next available weight check.  Senaida Dama, NP

## 2023-08-05 NOTE — Progress Notes (Signed)
 One month follow up no concerns

## 2023-08-06 ENCOUNTER — Encounter: Payer: Self-pay | Admitting: Family

## 2023-08-06 LAB — LIPID PANEL
Chol/HDL Ratio: 3.4 ratio (ref 0.0–4.4)
Cholesterol, Total: 131 mg/dL (ref 100–199)
HDL: 39 mg/dL — ABNORMAL LOW (ref >39)
LDL Chol Calc (NIH): 74 mg/dL (ref 0–99)
Triglycerides: 92 mg/dL (ref 0–149)
VLDL Cholesterol Cal: 18 mg/dL (ref 5–40)

## 2023-08-09 ENCOUNTER — Encounter: Payer: Self-pay | Admitting: Emergency Medicine

## 2023-08-25 ENCOUNTER — Emergency Department (HOSPITAL_COMMUNITY)
Admission: EM | Admit: 2023-08-25 | Discharge: 2023-08-25 | Disposition: A | Discharge status: Other Acute Inpatient Hospital | Payer: PRIVATE HEALTH INSURANCE | Attending: Emergency Medicine | Admitting: Emergency Medicine

## 2023-08-25 ENCOUNTER — Other Ambulatory Visit: Payer: Self-pay

## 2023-08-25 ENCOUNTER — Encounter (HOSPITAL_COMMUNITY): Payer: Self-pay

## 2023-08-25 ENCOUNTER — Ambulatory Visit
Admission: EM | Admit: 2023-08-25 | Discharge: 2023-08-25 | Disposition: A | Discharge status: Home / Self Care | Payer: PRIVATE HEALTH INSURANCE

## 2023-08-25 ENCOUNTER — Emergency Department (HOSPITAL_COMMUNITY): Payer: PRIVATE HEALTH INSURANCE

## 2023-08-25 DIAGNOSIS — R079 Chest pain, unspecified: Secondary | ICD-10-CM

## 2023-08-25 DIAGNOSIS — R0789 Other chest pain: Secondary | ICD-10-CM | POA: Diagnosis not present

## 2023-08-25 DIAGNOSIS — E871 Hypo-osmolality and hyponatremia: Secondary | ICD-10-CM | POA: Diagnosis not present

## 2023-08-25 DIAGNOSIS — Z95828 Presence of other vascular implants and grafts: Secondary | ICD-10-CM | POA: Insufficient documentation

## 2023-08-25 LAB — TROPONIN I (HIGH SENSITIVITY)
Troponin I (High Sensitivity): 3 ng/L (ref <18)
Troponin I (High Sensitivity): 3 ng/L (ref <18)

## 2023-08-25 LAB — HEPATIC FUNCTION PANEL
ALT: 24 U/L (ref 0–44)
AST: 18 U/L (ref 15–41)
Albumin: 4.2 g/dL (ref 3.5–5.0)
Alkaline Phosphatase: 58 U/L (ref 38–126)
Bilirubin, Direct: <0.1 mg/dL (ref 0.0–0.2)
Total Bilirubin: 0.8 mg/dL (ref 0.0–1.2)
Total Protein: 6.9 g/dL (ref 6.5–8.1)

## 2023-08-25 LAB — CBC
HCT: 38.9 % (ref 36.0–46.0)
Hemoglobin: 13.6 g/dL (ref 12.0–15.0)
MCH: 30.9 pg (ref 26.0–34.0)
MCHC: 35 g/dL (ref 30.0–36.0)
MCV: 88.4 fL (ref 80.0–100.0)
Platelets: 260 10*3/uL (ref 150–400)
RBC: 4.4 MIL/uL (ref 3.87–5.11)
RDW: 11.6 % (ref 11.5–15.5)
WBC: 9.6 10*3/uL (ref 4.0–10.5)
nRBC: 0 % (ref 0.0–0.2)

## 2023-08-25 LAB — BASIC METABOLIC PANEL WITH GFR
Anion gap: 7 (ref 5–15)
BUN: 14 mg/dL (ref 6–20)
CO2: 20 mmol/L — ABNORMAL LOW (ref 22–32)
Calcium: 9.2 mg/dL (ref 8.9–10.3)
Chloride: 107 mmol/L (ref 98–111)
Creatinine, Ser: 0.84 mg/dL (ref 0.44–1.00)
GFR, Estimated: >60 mL/min (ref >60)
Glucose, Bld: 92 mg/dL (ref 70–99)
Potassium: 3.8 mmol/L (ref 3.5–5.1)
Sodium: 134 mmol/L — ABNORMAL LOW (ref 135–145)

## 2023-08-25 LAB — LIPASE, BLOOD: Lipase: 37 U/L (ref 11–51)

## 2023-08-25 LAB — HCG, SERUM, QUALITATIVE: Preg, Serum: NEGATIVE

## 2023-08-25 LAB — D-DIMER, QUANTITATIVE: D-Dimer, Quant: <0.27 ug{FEU}/mL (ref 0.00–0.50)

## 2023-08-25 MED ORDER — LIDOCAINE VISCOUS HCL 2 % MT SOLN
15.0000 mL | Freq: Once | OROMUCOSAL | Status: AC
  Administered 2023-08-25: 15 mL via ORAL
  Filled 2023-08-25: qty 15

## 2023-08-25 MED ORDER — ASPIRIN 325 MG PO TABS
325.0000 mg | ORAL_TABLET | Freq: Every day | ORAL | Status: DC
  Administered 2023-08-25: 325 mg via ORAL
  Filled 2023-08-25: qty 1

## 2023-08-25 MED ORDER — METHOCARBAMOL 500 MG PO TABS: 500.0000 mg | ORAL_TABLET | Freq: Three times a day (TID) | ORAL | 0 refills | Status: AC | PRN

## 2023-08-25 MED ORDER — ALUM & MAG HYDROXIDE-SIMETH 200-200-20 MG/5ML PO SUSP
30.0000 mL | Freq: Once | ORAL | Status: AC
  Administered 2023-08-25: 30 mL via ORAL
  Filled 2023-08-25: qty 30

## 2023-08-25 NOTE — ED Notes (Signed)
 Called [facility].

## 2023-08-25 NOTE — Discharge Instructions (Signed)

## 2023-08-25 NOTE — ED Notes (Signed)
 Patient is being discharged from the Urgent Care and sent to the Emergency Department via POV . Per Jami Mcclintock, PA-C, patient is in need of higher level of care due to chest pain. Patient is aware and verbalizes understanding of plan of care.  Vitals:   08/25/23 0836  BP: 110/74  Pulse: 61  Resp: 18  Temp: 98.1 F (36.7 C)  SpO2: 99%

## 2023-08-25 NOTE — ED Notes (Signed)
 Patient transported to X-ray

## 2023-08-25 NOTE — ED Provider Notes (Signed)
 [name]    CSN: [id number] Arrival date & time: 08/25/23  0815      History   Chief Complaint Chief Complaint  Patient presents with   Chest Pain    HPI Sharon Townsend is a 33 y.o. female.   Patient presents today for evaluation of central chest pain that radiates to the right at times.  She reports that she has pain from her chest to her shoulder and back as well.  She notes that pain is a sharp squeezing pain.  It was initially intermittent but has not become more constant.  She denies any shortness of breath but has had some nausea and lightheadedness.  She notes she has had a gallbladder attack in the past but states this feels different.  She denies that anything makes symptoms better or worse.  The history is provided by the patient.  Chest Pain Associated symptoms: nausea   Associated symptoms: no abdominal pain, no fever, no shortness of breath and no vomiting     Past Medical History:  Diagnosis Date   Bipolar disorder (HCC)    Chronic constipation    Gall bladder stones    Headache    Migraines    PONV (postoperative nausea and vomiting)    Renal disorder    two tubes in left kidney going to bladder    Patient Active Problem List   Diagnosis Date Noted   Presence of stent in artery 08/25/2023   Anemia 05/10/2023   Family history of cleft lip 05/10/2023   Family history of congenital heart disease 05/10/2023   History of gestational hypertension 05/10/2023   Stented coronary artery 05/10/2023   Fetal demise due to miscarriage 05/10/2023   Type O blood, Rh negative 05/10/2023   History of stillbirth 05/10/2023   Prediabetes 10/22/2022   Anaphylaxis 03/26/2021   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   Menorrhagia with regular cycle 12/18/2019   Dyspareunia in female 12/18/2019   Family history of breast cancer 12/18/2019   Family history of ovarian cancer 12/18/2019   Pain in pelvis 07/15/2018   History of  pre-eclampsia 07/15/2016   Pregnancy 11/18/2015    Past Surgical History:  Procedure Laterality Date   CESAREAN SECTION N/A 06/14/2016   Procedure: CESAREAN SECTION;  Surgeon: Atlas Blank, DO;  Location: [facility];  Service: Obstetrics;  Laterality: N/A;   child birth  2016   CHOLECYSTECTOMY     DILITATION & CURRETTAGE/HYSTROSCOPY WITH NOVASURE ABLATION N/A 02/21/2020   Procedure: DILATATION & CURETTAGE, WITH NOVASURE ABLATION;  Surgeon: Tresia Fruit, MD;  Location: [facility];  Service: Gynecology;  Laterality: N/A;   MANDIBLE SURGERY     renal stents     TONSILLECTOMY     TUBAL LIGATION  06/14/2016    OB History     Gravida  4   Para  3   Term  1   Preterm  2   AB  1   Living  2      SAB  1   IAB  0   Ectopic  0   Multiple  0   Live Births  2            Home Medications    Prior to Admission medications   Medication Sig Start Date End Date Taking? Authorizing Provider  albuterol  (VENTOLIN  HFA) 108 (90 Base) MCG/ACT inhaler Inhale 1-2 puffs into the lungs every 6 (six) hours as needed. 07/01/22  Yes  Nellie Banas M, PA-C  atorvastatin  (LIPITOR) 10 MG tablet Take 1 tablet (10 mg total) by mouth daily. 08/05/23 11/03/23 Yes Rogerio Clay, Amy J, NP  EPINEPHrine  0.3 mg/0.3 mL IJ SOAJ injection Inject 0.3 mg into the muscle as needed for anaphylaxis. 06/10/22  Yes Delray Fielding, PA-C  Ferrous Gluconate (IRON 27 PO) daily.   Yes [provider]  fluticasone  (FLONASE ) 50 MCG/ACT nasal spray Place 2 sprays into both nostrils daily. 10/09/21  Yes Angelia Kelp, PA-C  ibuprofen  (ADVIL ) 200 MG tablet Take 600 mg by mouth every 6 (six) hours as needed for headache or mild pain (pain score 1-3).   Yes [provider]  Multiple Vitamins-Minerals (ONE-A-DAY WOMENS PO) Take 1 tablet by mouth at bedtime.   Yes [provider]  Omega-3 Fatty Acids (FISH OIL) 1000 MG CPDR Take 1,000 mg by mouth at bedtime.   Yes  [provider]  SUMAtriptan  (IMITREX ) 50 MG tablet Take 50 mg (1 tablet total) by mouth at the start of the headache. May repeat in 2 hours x 1 if headache persists. Max of 2 tablets/24 hours. 08/12/22  Yes Rogerio Clay, Amy J, NP  topiramate  (TOPAMAX ) 25 MG tablet Take one each morning and 2 in the evening. 04/07/23  Yes Rogerio Clay, Amy J, NP  Acetaminophen  (TYLENOL  8 HOUR PO)     [provider]  clindamycin (CLEOCIN) 300 MG capsule Take 300 mg by mouth every 6 (six) hours. Patient not taking: Reported on 08/25/2023 05/24/23   [provider]  doxycycline  (VIBRA -TABS) 100 MG tablet Take 1 tablet (100 mg total) by mouth 2 (two) times daily. Patient not taking: Reported on 12/01/2022 03/04/22   Farris Hong, PA-C  ibuprofen  (ADVIL ) 800 MG tablet Take 800 mg by mouth every 6 (six) hours as needed. Patient not taking: Reported on 08/25/2023 05/24/23   [provider]  ipratropium (ATROVENT ) 0.03 % nasal spray Place 2 sprays into both nostrils every 12 (twelve) hours. Patient not taking: Reported on 04/07/2023 12/28/22   Mardene Shake, FNP  ondansetron  (ZOFRAN -ODT) 4 MG disintegrating tablet Take 1 tablet (4 mg total) by mouth every 8 (eight) hours as needed for nausea or vomiting. Patient not taking: Reported on 08/25/2023 04/15/23   Farris Hong, PA-C  PARoxetine  (PAXIL ) 20 MG tablet Take 1 tablet (20 mg total) by mouth daily. Patient not taking: Reported on 08/25/2023 07/06/23   Senaida Dama, NP  phentermine  30 MG capsule Take 1 capsule (30 mg total) by mouth every morning. 08/05/23   Senaida Dama, NP  predniSONE  (STERAPRED UNI-PAK 21 TAB) 10 MG (21) TBPK tablet 6 day taper; take as directed on package instructions Patient not taking: Reported on 12/01/2022 07/01/22   Burnette, Jennifer M, PA-C  topiramate  (TOPAMAX ) 25 MG tablet Take 25 mg (1 tablet total) by mouth at the start of the headache. May repeat in 2 hours x 1 if headache persists. Max of 2 tablets/24  hours. Patient not taking: Reported on 08/05/2023 07/06/23   Senaida Dama, NP    Family History Family History  Problem Relation Age of Onset   Diabetes Mother    Breast cancer Mother 26   Diabetes Father    Hypertension Father    Breast cancer Maternal Grandmother    Ovarian cancer Paternal Grandmother     Social History Social History   Tobacco Use   Smoking status: Never   Smokeless tobacco: Never  Vaping Use   Vaping status: Never Used  Substance  Use Topics   Alcohol use: No   Drug use: No     Allergies   Peanut allergen powder-dnfp, Peanut oil, Latex, and Penicillins   Review of Systems Review of Systems  Constitutional:  Negative for chills and fever.  Eyes:  Negative for discharge and redness.  Respiratory:  Negative for shortness of breath.   Cardiovascular:  Positive for chest pain.  Gastrointestinal:  Positive for nausea. Negative for abdominal pain and vomiting.  Neurological:  Positive for light-headedness.     Physical Exam Triage Vital Signs ED Triage Vitals  Encounter Vitals Group     BP 08/25/23 0836 110/74     Systolic BP Percentile --      Diastolic BP Percentile --      Pulse Rate 08/25/23 0836 61     Resp 08/25/23 0836 18     Temp 08/25/23 0836 98.1 F (36.7 C)     Temp Source 08/25/23 0836 Oral     SpO2 08/25/23 0836 99 %     Weight --      Height --      Head Circumference --      Peak Flow --      Pain Score 08/25/23 0832 8     Pain Loc --      Pain Education --      Exclude from Growth Chart --    No data found.  Updated Vital Signs BP 110/74 (BP Location: Left Arm)   Pulse 61   Temp 98.1 F (36.7 C) (Oral)   Resp 18   SpO2 99%   Visual Acuity Right Eye Distance:   Left Eye Distance:   Bilateral Distance:    Right Eye Near:   Left Eye Near:    Bilateral Near:     Physical Exam Vitals and nursing note reviewed.  Constitutional:      General: She is not in acute distress.    Appearance: Normal appearance.  She is not ill-appearing.  HENT:     Head: Normocephalic and atraumatic.  Eyes:     Conjunctiva/sclera: Conjunctivae normal.  Cardiovascular:     Rate and Rhythm: Normal rate and regular rhythm.  Pulmonary:     Effort: Pulmonary effort is normal. No respiratory distress.     Breath sounds: No wheezing, rhonchi or rales.  Neurological:     Mental Status: She is alert.  Psychiatric:        Mood and Affect: Mood normal.        Behavior: Behavior normal.        Thought Content: Thought content normal.      UC Treatments / Results  Labs (all labs ordered are listed, but only abnormal results are displayed) Labs Reviewed - No data to display  EKG   Radiology No results found.  Procedures Procedures (including critical care time)  Medications Ordered in UC Medications - No data to display  Initial Impression / Assessment and Plan / UC Course  I have reviewed the triage vital signs and the nursing notes.  Pertinent labs & imaging results that were available during my care of the patient were reviewed by me and considered in my medical decision making (see chart for details).    EKG with sinus bradycardia only.  Recommended further evaluation in the emergency room for further workup as I suspect she will need stat labs and imaging.  Patient is agreeable to same and husband who is with her will transport via POV.  Final  Clinical Impressions(s) / UC Diagnoses   Final diagnoses:  Chest pain, unspecified type   Discharge Instructions   None    ED Prescriptions   None    PDMP not reviewed this encounter.   Vernestine Gondola, PA-C 08/25/23 [phone]

## 2023-08-25 NOTE — ED Triage Notes (Signed)
 Pt reports centralized chest pain radiating to right chest/shoulder/back. "Sharp squeezing". States she has had "gallbladder attack beforE" and this is different. Endorses nausea and lightheadedness

## 2023-08-25 NOTE — ED Triage Notes (Signed)
 Pt came in via POV d/t central CP that radiates into her Lt shoulder & Rt side of back for the last couple of days. States it felt worse this morning so she went to [facility] & she was sent here for eval. A/Ox4, rates her pain 8/10 during triage.

## 2023-08-25 NOTE — ED Provider Notes (Signed)
 Emergency Department Provider Note   I have reviewed the triage vital signs and the nursing notes.   HISTORY  Chief Complaint Chest Pain   HPI Sharon Townsend is a 33 y.o. female with past history reviewed below presents emergency department with chest pain.  Patient has pain in the center of her chest which radiates to her back, left shoulder, right back.  Symptoms have been ongoing for the past couple of days without any clear provoking factors.  No shortness of breath.  Pain is pressure-like.  She was initially seen in urgent care but referred here for further evaluation. No VTE history. No recent travel or surgery.    Past Medical History:  Diagnosis Date   Bipolar disorder (HCC)    Chronic constipation    Gall bladder stones    Headache    Migraines    PONV (postoperative nausea and vomiting)    Renal disorder    two tubes in left kidney going to bladder    Review of Systems  Constitutional: No fever/chills Cardiovascular: Denies chest pain. Respiratory: Denies shortness of breath. Gastrointestinal: No abdominal pain.   Skin: Negative for rash. Neurological: Negative for headaches, focal weakness or numbness.  ____________________________________________   PHYSICAL EXAM:  VITAL SIGNS: ED Triage Vitals [08/25/23 0926]  Encounter Vitals Group     BP 128/78     Pulse Rate 60     Resp 16     Temp 98.6 F (37 C)     Temp Source Oral     SpO2 100 %     Weight 135 lb (61.2 kg)     Height 5' (1.524 m)   Constitutional: Alert and oriented. Well appearing and in no acute distress. Eyes: Conjunctivae are normal.  Head: Atraumatic. Nose: No congestion/rhinnorhea. Mouth/Throat: Mucous membranes are moist.   Neck: No stridor.   Cardiovascular: Normal rate, regular rhythm. Good peripheral circulation. Grossly normal heart sounds.   Respiratory: Normal respiratory effort.  No retractions. Lungs CTAB. Gastrointestinal: Soft and nontender. No distention.   Musculoskeletal: No lower extremity tenderness nor edema. No gross deformities of extremities. Neurologic:  Normal speech and language. No gross focal neurologic deficits are appreciated.  Skin:  Skin is warm, dry and intact. No rash noted.  ____________________________________________   LABS (all labs ordered are listed, but only abnormal results are displayed)  Labs Reviewed  BASIC METABOLIC PANEL WITH GFR - Abnormal; Notable for the following components:      Result Value   Sodium 134 (*)    CO2 20 (*)    All other components within normal limits  CBC  HCG, SERUM, QUALITATIVE  D-DIMER, QUANTITATIVE  HEPATIC FUNCTION PANEL  LIPASE, BLOOD  TROPONIN I (HIGH SENSITIVITY)  TROPONIN I (HIGH SENSITIVITY)   ____________________________________________  EKG   EKG Interpretation Date/Time:  Wednesday Aug 25 2023 09:25:34 EDT Ventricular Rate:  65 PR Interval:  148 QRS Duration:  77 QT Interval:  394 QTC Calculation: 410 R Axis:   74  Text Interpretation: Sinus rhythm Borderline T abnormalities, anterior leads Confirmed by Abby Hocking [phone]) on 08/25/2023 9:35:50 AM        ____________________________________________  RADIOLOGY  DG Chest 2 View Result Date: 08/25/2023 CLINICAL DATA:  Chest pain EXAM: CHEST - 2 VIEW COMPARISON:  None Available. FINDINGS: The heart size and mediastinal contours are within normal limits. Both lungs are clear. The visualized skeletal structures are unremarkable. IMPRESSION: No active cardiopulmonary disease. Electronically Signed   By: Vola Grow.D.  On: 08/25/2023 10:22    ____________________________________________   PROCEDURES  Procedure(s) performed:   Procedures  None  ____________________________________________   INITIAL IMPRESSION / ASSESSMENT AND PLAN / ED COURSE  Pertinent labs & imaging results that were available during my care of the patient were reviewed by me and considered in my medical decision making  (see chart for details).   This patient is Presenting for Evaluation of CP, which does require a range of treatment options, and is a complaint that involves a high risk of morbidity and mortality.  The Differential Diagnoses  includes but is not exclusive to acute coronary syndrome, aortic dissection, pulmonary embolism, cardiac tamponade, community-acquired pneumonia, pericarditis, musculoskeletal chest wall pain, etc.   Critical Interventions-    Medications  alum & mag hydroxide-simeth (MAALOX/MYLANTA) 200-200-20 MG/5ML suspension 30 mL (30 mLs Oral Given 08/25/23 1001)    And  lidocaine  (XYLOCAINE ) 2 % viscous mouth solution 15 mL (15 mLs Oral Given 08/25/23 1001)    Reassessment after intervention: pain improved.    Clinical Laboratory Tests Ordered, included troponin negative.  CBC without leukocytosis or anemia.  No acute kidney injury.  Lipase, D-dimer, LFTs normal.  Radiologic Tests Ordered, included CXR. I independently interpreted the images and agree with radiology interpretation.   Cardiac Monitor Tracing which shows NSR.    Social Determinants of Health Risk patient is a non-smoker.   Medical Decision Making: Summary:  The patient presents to the emergency department for evaluation of chest pain.  Broad differential considered as above.  Seems more consistent with GI etiology but will complete ACS and PE workup.  Reevaluation with update and discussion with patient.  She is feeling much better.  Labs are unremarkable.  Symptoms improved and pain resolved.  Stable for discharge.   Patient's presentation is most consistent with acute presentation with potential threat to life or bodily function.   Disposition: discharge  ____________________________________________  FINAL CLINICAL IMPRESSION(S) / ED DIAGNOSES  Final diagnoses:  Atypical chest pain     NEW OUTPATIENT MEDICATIONS STARTED DURING THIS VISIT:  Discharge Medication List as of 08/25/2023  1:09 PM      START taking these medications   Details  methocarbamol  (ROBAXIN ) 500 MG tablet Take 1 tablet (500 mg total) by mouth every 8 (eight) hours as needed., Starting Wed 08/25/2023, Normal        Note:  This document was prepared using Dragon voice recognition software and may include unintentional dictation errors.  Abby Hocking, MD, [facility]    Venia Riveron, Shereen Dike, MD 09/09/23 [phone]

## 2023-09-06 ENCOUNTER — Encounter: Payer: Self-pay | Admitting: Family

## 2023-09-06 ENCOUNTER — Encounter: Payer: PRIVATE HEALTH INSURANCE | Admitting: Family

## 2023-09-06 NOTE — Progress Notes (Signed)
 Erroneous encounter-disregard

## 2023-09-29 ENCOUNTER — Other Ambulatory Visit: Payer: PRIVATE HEALTH INSURANCE

## 2023-09-30 ENCOUNTER — Other Ambulatory Visit: Payer: PRIVATE HEALTH INSURANCE

## 2023-10-17 ENCOUNTER — Telehealth: Payer: PRIVATE HEALTH INSURANCE | Admitting: Physician Assistant

## 2023-10-17 ENCOUNTER — Ambulatory Visit
Admission: EM | Admit: 2023-10-17 | Discharge: 2023-10-17 | Disposition: A | Discharge status: Home / Self Care | Payer: PRIVATE HEALTH INSURANCE

## 2023-10-17 DIAGNOSIS — S01512A Laceration without foreign body of oral cavity, initial encounter: Secondary | ICD-10-CM

## 2023-10-17 DIAGNOSIS — S0993XA Unspecified injury of face, initial encounter: Secondary | ICD-10-CM

## 2023-10-17 HISTORY — DX: Allergy, unspecified, initial encounter: T78.40XA

## 2023-10-17 NOTE — Progress Notes (Signed)
  Because of your injury, I feel your condition warrants further evaluation and I recommend that you be seen in a face-to-face visit.   NOTE: There will be NO CHARGE for this E-Visit   If you are having a true medical emergency, please call 911.     For an urgent face to face visit, [place] has multiple urgent care centers for your convenience.  Click the link below for the full list of locations and hours, walk-in wait times, appointment scheduling options and driving directions:  Urgent Care - [place], [place], [place], [place], [place], [place]  [place]     Your MyChart E-visit questionnaire answers were reviewed by a board certified advanced clinical practitioner to complete your personal care plan based on your specific symptoms.    Thank you for using e-Visits.

## 2023-10-17 NOTE — ED Triage Notes (Signed)
 Around 7am this morning when eating breakfast I had bitten my tongue, I had a lot of bleeding at the time (stopped after 5 mins) but my face/jaw (on right side) continues. I did an E-Visit and they recommended Urgent Care.

## 2023-10-17 NOTE — ED Provider Notes (Signed)
 [name]    CSN: [id number] Arrival date & time: 10/17/23  1312      History   Chief Complaint Chief Complaint  Patient presents with   Swelling/Pain    HPI Sharon Townsend is a 33 y.o. female.   Patient here today for evaluation of injury to her tongue that occurred this morning when she was eating breakfast.  She reports she accidentally bit her tongue she was having some bleeding but this stopped after about 5 minutes.  She notes the bleeding to her jaw was slower to stop.  She has not had any other symptoms.  The history is provided by the patient.    Past Medical History:  Diagnosis Date   Allergy [id number]   Bipolar disorder (HCC)    Chronic constipation    Gall bladder stones    Headache    Migraines    PONV (postoperative nausea and vomiting)    Renal disorder    two tubes in left kidney going to bladder    Patient Active Problem List   Diagnosis Date Noted   Presence of stent in artery 08/25/2023   Anemia 05/10/2023   Family history of cleft lip 05/10/2023   Family history of congenital heart disease 05/10/2023   History of gestational hypertension 05/10/2023   Stented coronary artery 05/10/2023   Fetal demise due to miscarriage 05/10/2023   Type O blood, Rh negative 05/10/2023   History of stillbirth 05/10/2023   Prediabetes 10/22/2022   Anaphylaxis 03/26/2021   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   Menorrhagia with regular cycle 12/18/2019   Dyspareunia in female 12/18/2019   Family history of breast cancer 12/18/2019   Family history of ovarian cancer 12/18/2019   Pain in pelvis 07/15/2018   History of pre-eclampsia 07/15/2016   Pregnancy 11/18/2015    Past Surgical History:  Procedure Laterality Date   CESAREAN SECTION N/A 06/14/2016   Procedure: CESAREAN SECTION;  Surgeon: Donna Just, DO;  Location: [facility];  Service: Obstetrics;  Laterality: N/A;   child birth  2016   CHOLECYSTECTOMY   12/2014   DILITATION & CURRETTAGE/HYSTROSCOPY WITH NOVASURE ABLATION N/A 02/21/2020   Procedure: DILATATION & CURETTAGE, WITH NOVASURE ABLATION;  Surgeon: Eveline Lynwood MATSU, MD;  Location: [facility];  Service: Gynecology;  Laterality: N/A;   MANDIBLE SURGERY     renal stents     TONSILLECTOMY     TUBAL LIGATION  06/14/2016    OB History     Gravida  4   Para  3   Term  1   Preterm  2   AB  1   Living  2      SAB  1   IAB  0   Ectopic  0   Multiple  0   Live Births  2            Home Medications    Prior to Admission medications   Medication Sig Start Date End Date Taking? Authorizing Provider  PARoxetine  (PAXIL ) 20 MG tablet Take 1 tablet (20 mg total) by mouth daily. Patient taking differently: Take 20 mg by mouth every evening. 07/06/23  Yes Lorren, Amy J, NP  phentermine  30 MG capsule Take 30 mg by mouth every morning. 08/27/23  Yes [provider]  topiramate  (TOPAMAX ) 25 MG tablet Take one each morning and 2 in the evening. Patient taking differently: Take 25 mg by mouth every evening. 04/07/23  Yes Lorren, Amy  J, NP  atorvastatin  (LIPITOR) 10 MG tablet Take 1 tablet (10 mg total) by mouth daily. Patient taking differently: Take 10 mg by mouth every evening. 08/05/23 11/03/23  Lorren Greig PARAS, NP  EPINEPHrine  0.3 mg/0.3 mL IJ SOAJ injection Inject 0.3 mg into the muscle as needed for anaphylaxis. 06/10/22   Emelia Sluder, PA-C  methocarbamol  (ROBAXIN ) 500 MG tablet Take 1 tablet (500 mg total) by mouth every 8 (eight) hours as needed. 08/25/23   Long, Fonda MATSU, MD  Multiple Vitamins-Minerals (ONE-A-DAY WOMENS PO) Take 1 tablet by mouth every evening.    [provider]  SUMAtriptan  (IMITREX ) 50 MG tablet Take 50 mg (1 tablet total) by mouth at the start of the headache. May repeat in 2 hours x 1 if headache persists. Max of 2 tablets/24 hours. 08/12/22   Lorren Greig PARAS, NP    Family History Family History  Problem Relation Age of  Onset   Diabetes Mother    Breast cancer Mother 6   Cancer Mother    Miscarriages / [place] Mother    Varicose Veins Mother    Diabetes Father    Hypertension Father    Alcohol abuse Father    Drug abuse Father    Breast cancer Maternal Grandmother    Cancer Maternal Grandmother    Ovarian cancer Paternal Grandmother    Cancer Paternal Grandmother    Stroke Paternal Grandmother    Cancer Maternal Grandfather    ADD / ADHD Sister     Social History Social History   Tobacco Use   Smoking status: Never   Smokeless tobacco: Never  Vaping Use   Vaping status: Never Used  Substance Use Topics   Alcohol use: No   Drug use: No     Allergies   Peanut (diagnostic), Latex, and Penicillins   Review of Systems Review of Systems  Constitutional:  Negative for chills and fever.  HENT:  Positive for mouth sores.   Eyes:  Negative for discharge and redness.  Gastrointestinal:  Negative for nausea and vomiting.  Skin:  Positive for wound. Negative for color change.     Physical Exam Triage Vital Signs ED Triage Vitals  Encounter Vitals Group     BP 10/17/23 1319 116/76     Girls Systolic BP Percentile --      Girls Diastolic BP Percentile --      Boys Systolic BP Percentile --      Boys Diastolic BP Percentile --      Pulse Rate 10/17/23 1319 73     Resp 10/17/23 1319 18     Temp 10/17/23 1319 98.2 F (36.8 C)     Temp Source 10/17/23 1319 Oral     SpO2 10/17/23 1319 99 %     Weight 10/17/23 1318 170 lb (77.1 kg)     Height 10/17/23 1318 5' (1.524 m)     Head Circumference --      Peak Flow --      Pain Score 10/17/23 1316 7     Pain Loc --      Pain Education --      Exclude from Growth Chart --    No data found.  Updated Vital Signs BP 116/76 (BP Location: Right Arm)   Pulse 73   Temp 98.2 F (36.8 C) (Oral)   Resp 18   Ht 5' (1.524 m)   Wt 170 lb (77.1 kg)   SpO2 99%   BMI 33.20 kg/m   Visual  Acuity Right Eye Distance:   Left Eye Distance:    Bilateral Distance:    Right Eye Near:   Left Eye Near:    Bilateral Near:     Physical Exam Vitals and nursing note reviewed.  Constitutional:      General: She is not in acute distress.    Appearance: Normal appearance. She is not ill-appearing.  HENT:     Head: Normocephalic and atraumatic.     Mouth/Throat:     Comments: See photo of tongue, no bleeding appreciated to inner right jaw area Eyes:     Conjunctiva/sclera: Conjunctivae normal.  Cardiovascular:     Rate and Rhythm: Normal rate.  Pulmonary:     Effort: Pulmonary effort is normal.  Neurological:     Mental Status: She is alert.  Psychiatric:        Mood and Affect: Mood normal.        Behavior: Behavior normal.        Thought Content: Thought content normal.      UC Treatments / Results  Labs (all labs ordered are listed, but only abnormal results are displayed) Labs Reviewed - No data to display  EKG   Radiology No results found.  Procedures Procedures (including critical care time)  Medications Ordered in UC Medications - No data to display  Initial Impression / Assessment and Plan / UC Course  I have reviewed the triage vital signs and the nursing notes.  Pertinent labs & imaging results that were available during my care of the patient were reviewed by me and considered in my medical decision making (see chart for details).    Reassured patient bleeding has stopped at this time, no indication for closure of any wounds. Advised ice to help with swelling and pain and ibuprofen  if needed. Encouraged follow up with any concerns.   Final Clinical Impressions(s) / UC Diagnoses   Final diagnoses:  Tongue injury, initial encounter   Discharge Instructions   None    ED Prescriptions   None    PDMP not reviewed this encounter.   Billy Asberry FALCON, PA-C 10/17/23 1410

## 2023-10-21 ENCOUNTER — Encounter: Payer: PRIVATE HEALTH INSURANCE | Admitting: Family

## 2023-10-21 NOTE — Progress Notes (Signed)
 Erroneous encounter-disregard

## 2023-11-01 ENCOUNTER — Encounter: Payer: PRIVATE HEALTH INSURANCE | Admitting: Family

## 2023-11-01 ENCOUNTER — Ambulatory Visit: Payer: PRIVATE HEALTH INSURANCE | Admitting: Family

## 2023-11-01 NOTE — Progress Notes (Signed)
 Erroneous encounter-disregard

## 2024-01-13 ENCOUNTER — Ambulatory Visit

## 2024-01-13 DIAGNOSIS — Z23 Encounter for immunization: Secondary | ICD-10-CM

## 2024-01-20 ENCOUNTER — Telehealth: Payer: Self-pay | Admitting: Family

## 2024-01-20 NOTE — Telephone Encounter (Signed)
 Called pt and left vm to call office back to reschedule missed appt. Pt scheduled an appt with same visit type via MyChart, although appt notes are different. I have canceled today's appt.

## 2024-02-08 ENCOUNTER — Ambulatory Visit: Payer: PRIVATE HEALTH INSURANCE

## 2024-02-08 VITALS — BP 124/86 | HR 72 | Temp 98.9°F | Resp 16 | Ht 65.0 in | Wt 187.0 lb

## 2024-02-08 DIAGNOSIS — Z111 Encounter for screening for respiratory tuberculosis: Secondary | ICD-10-CM | POA: Diagnosis not present

## 2024-02-08 DIAGNOSIS — Z136 Encounter for screening for cardiovascular disorders: Secondary | ICD-10-CM

## 2024-02-08 DIAGNOSIS — Z13220 Encounter for screening for lipoid disorders: Secondary | ICD-10-CM

## 2024-02-08 DIAGNOSIS — Z6831 Body mass index (BMI) 31.0-31.9, adult: Secondary | ICD-10-CM

## 2024-02-08 MED ORDER — PHENTERMINE HCL 15 MG PO CAPS
15.0000 mg | ORAL_CAPSULE | ORAL | 0 refills | Status: AC
Start: 2024-02-08 — End: ?

## 2024-02-08 NOTE — Progress Notes (Unsigned)
     Patient ID: Sharon Townsend, female    DOB: 1991/01/31  MRN: [id number]  CC: Weight Check   Subjective: Sharon Townsend is a 33 y.o. female with past medical history of *** who presents to clinic for  Phentermine  30mg .   Allergies  Allergen Reactions   Peanut (Diagnostic) Anaphylaxis   Latex Itching and Rash   Penicillins Itching, Rash and Other (See Comments)    Dysuria  Dyschezia    ROS: Review of Systems Negative except as stated above  PHYSICAL EXAM: BP 124/86   Pulse 72   Temp 98.9 F (37.2 C) (Oral)   Resp 16   Ht 5' 5 (1.651 m)   Wt 187 lb (84.8 kg)   SpO2 98%   BMI 31.12 kg/m   Physical Exam  General: well-appearing, no acute distress Skin: no jaundice, rashes, or lesions Cardiovascular: regular heart rate and rhythm, normal S1/S2, no murmurs, gallops, or rubs, peripheral pulses 2+ bilaterally Chest: no skeletal deformity, lungs clear to auscultation bilaterally, equal breath sounds bilaterally Abdomen: soft, non-distended, non-tender to palpation, no hepatomegaly, no splenomegaly, normoactive bowel sounds Musculoskeletal: normal gait Extremities: no peripheral edema  ASSESSMENT AND PLAN:    Patient was given the opportunity to ask questions.  Patient verbalized understanding of the plan and was able to repeat key elements of the plan.    Orders Placed This Encounter  Procedures   Lipid panel   PPD     Requested Prescriptions   Signed Prescriptions Disp Refills   phentermine  15 MG capsule 30 capsule 0    Sig: Take 1 capsule (15 mg total) by mouth every morning.    Return in about 1 month (around 03/09/2024) for with Amy (PCP).  Sula Leavy Rode, PA-C

## 2024-02-09 LAB — LIPID PANEL
Chol/HDL Ratio: 4.2 ratio (ref 0.0–4.4)
Cholesterol, Total: 176 mg/dL (ref 100–199)
HDL: 42 mg/dL (ref >39)
LDL Chol Calc (NIH): 99 mg/dL (ref 0–99)
Triglycerides: 202 mg/dL — ABNORMAL HIGH (ref 0–149)
VLDL Cholesterol Cal: 35 mg/dL (ref 5–40)

## 2024-02-10 ENCOUNTER — Ambulatory Visit: Admitting: *Deleted

## 2024-02-10 ENCOUNTER — Ambulatory Visit: Payer: Self-pay

## 2024-02-10 LAB — TB SKIN TEST
Induration: 0 mm
TB Skin Test: NEGATIVE

## 2024-02-10 NOTE — Progress Notes (Signed)
Patient came in for TB reading.

## 2024-03-09 ENCOUNTER — Encounter: Admitting: Family

## 2024-03-09 ENCOUNTER — Telehealth: Payer: Self-pay | Admitting: Family

## 2024-03-09 NOTE — Progress Notes (Signed)
 Erroneous encounter-disregard

## 2024-03-09 NOTE — Telephone Encounter (Signed)
 Called patient about missed appt on 12/11. Left voicemail.

## 2024-03-10 ENCOUNTER — Telehealth: Admitting: Family Medicine

## 2024-03-10 DIAGNOSIS — J111 Influenza due to unidentified influenza virus with other respiratory manifestations: Secondary | ICD-10-CM | POA: Diagnosis not present

## 2024-03-10 MED ORDER — FLUTICASONE PROPIONATE 50 MCG/ACT NA SUSP: 2.0000 | Freq: Every day | NASAL | 0 refills | Status: AC

## 2024-03-10 MED ORDER — BENZONATATE 100 MG PO CAPS: 100.0000 mg | ORAL_CAPSULE | Freq: Three times a day (TID) | ORAL | 0 refills | Status: AC | PRN

## 2024-03-10 MED ORDER — OSELTAMIVIR PHOSPHATE 75 MG PO CAPS: 75.0000 mg | ORAL_CAPSULE | Freq: Two times a day (BID) | ORAL | 0 refills | Status: AC

## 2024-03-10 NOTE — Progress Notes (Signed)
 E visit for Flu like symptoms   We are sorry that you are not feeling well.  Here is how we plan to help! Based on what you have shared with me it looks like you may have a respiratory virus that may be influenza.  Influenza or the flu is  an infection caused by a respiratory virus. The flu virus is highly contagious and persons who did not receive their yearly flu vaccination may catch the flu from close contact.  We have anti-viral medications to treat the viruses that cause this infection. They are not a cure and only shorten the course of the infection. These prescriptions are most effective when they are given within the first 2 days of flu symptoms. Antiviral medications are indicated if you have a high risk of complications from the flu. You should  also consider an antiviral medication if you are in close contact with someone who is at risk. These medications can help patients avoid complications from the flu but have side effects that you should know.   Possible side effects from Tamiflu  or oseltamivir  include nausea, vomiting, diarrhea, dizziness, headaches, eye redness, sleep problems or other respiratory symptoms. You should not take Tamiflu  if you have an allergy to oseltamivir  or any to the ingredients in Tamiflu .  Based upon your symptoms and potential risk factors I have prescribed Oseltamivir  (Tamiflu ).  It has been sent to your designated pharmacy.  You will take one 75 mg capsule orally twice a day for the next 5 days.   For nasal congestion, you may use an oral decongestant such as Mucinex D or if you have glaucoma or high blood pressure use plain Mucinex.  Saline nasal spray or nasal drops can help and can safely be used as often as needed for congestion.  If you have a sore or scratchy throat, use a saltwater gargle-  to  teaspoon of salt dissolved in a 4-ounce to 8-ounce glass of warm water.  Gargle the solution for approximately 15-30 seconds and then spit.  It is  important not to swallow the solution.  You can also use throat lozenges/cough drops and Chloraseptic spray to help with throat pain or discomfort.  Warm or cold liquids can also be helpful in relieving throat pain.  For headache, pain or general discomfort, you can use Ibuprofen  or Tylenol  as directed.   Some authorities believe that zinc  sprays or the use of Echinacea may shorten the course of your symptoms.  I have prescribed the following medications to help lessen symptoms: I have prescribed Tessalon  Perles 100 mg. You may take 1-2 capsules every 8 hours as needed for cough and I have prescribed Fluticasone  nasal spray 2 sprays in each nostril one time per dayasal spray 2 sprays in each nostril one time per day  You are to isolate at home until you have been fever-free for at least 24 hours without a fever-reducing medication, and symptoms have been steadily improving for 24 hours.  If you must be around other household members who do not have symptoms, you need to make sure that both you and the family members are masking consistently with a high-quality mask.  If you note any worsening of symptoms despite treatment, please seek an in-person evaluation ASAP. If you note any significant shortness of breath or any chest pain, please seek ED evaluation. Please do not delay care!  ANYONE WHO HAS FLU SYMPTOMS SHOULD: Stay home. The flu is highly contagious and going out or to work  exposes others! Be sure to drink plenty of fluids. Water is fine as well as fruit juices, sodas and electrolyte beverages. You may want to stay away from caffeine  or alcohol. If you are nauseated, try taking small sips of liquids. How do you know if you are getting enough fluid? Your urine should be a pale yellow or almost colorless. Get rest. Taking a steamy shower or using a humidifier may help nasal congestion and ease sore throat pain. Using a saline nasal spray works much the same way. Cough drops, hard candies and  sore throat lozenges may ease your cough. Line up a caregiver. Have someone check on you regularly.  GET HELP RIGHT AWAY IF: You cannot keep down liquids or your medications. You become short of breath Your fell like you are going to pass out or loose consciousness. Your symptoms persist after you have completed your treatment plan  MAKE SURE YOU  Understand these instructions. Will watch your condition. Will get help right away if you are not doing well or get worse.  Your e-visit answers were reviewed by a board certified advanced clinical practitioner to complete your personal care plan.  Depending on the condition, your plan could have included both over the counter or prescription medications.  If there is a problem please reply  once you have received a response from your provider.  Your safety is important to us .  If you have drug allergies check your prescription carefully.    You can use MyChart to ask questions about todays visit, request a non-urgent call back, or ask for a work or school excuse for 24 hours related to this e-Visit. If it has been greater than 24 hours you will need to follow up with your provider, or enter a new e-Visit to address those concerns.  You will get an e-mail in the next two days asking about your experience.  I hope that your e-visit has been valuable and will speed your recovery. Thank you for using e-visits.   I have spent 5 minutes in review of e-visit questionnaire, review and updating patient chart, medical decision making and response to patient.   Delon CHRISTELLA Dickinson, PA-C
